# Patient Record
Sex: Female | Born: 1935
Health system: Southern US, Community
[De-identification: ages and names within clinical notes are randomized; demographics above are authoritative.]

## PROBLEM LIST (undated history)

## (undated) DIAGNOSIS — I428 Other cardiomyopathies: Secondary | ICD-10-CM

## (undated) DIAGNOSIS — E039 Hypothyroidism, unspecified: Secondary | ICD-10-CM

## (undated) DIAGNOSIS — M519 Unspecified thoracic, thoracolumbar and lumbosacral intervertebral disc disorder: Secondary | ICD-10-CM

## (undated) DIAGNOSIS — I509 Heart failure, unspecified: Secondary | ICD-10-CM

## (undated) DIAGNOSIS — N183 Chronic kidney disease, stage 3 unspecified: Secondary | ICD-10-CM

## (undated) DIAGNOSIS — I472 Ventricular tachycardia, unspecified: Secondary | ICD-10-CM

## (undated) DIAGNOSIS — I10 Essential (primary) hypertension: Secondary | ICD-10-CM

## (undated) DIAGNOSIS — I700 Atherosclerosis of aorta: Secondary | ICD-10-CM

## (undated) DIAGNOSIS — Z95810 Presence of automatic (implantable) cardiac defibrillator: Secondary | ICD-10-CM

## (undated) DIAGNOSIS — Q210 Ventricular septal defect: Secondary | ICD-10-CM

## (undated) DIAGNOSIS — I5022 Chronic systolic (congestive) heart failure: Secondary | ICD-10-CM

## (undated) DIAGNOSIS — Z8711 Personal history of peptic ulcer disease: Secondary | ICD-10-CM

## (undated) DIAGNOSIS — E785 Hyperlipidemia, unspecified: Secondary | ICD-10-CM

## (undated) DIAGNOSIS — M109 Gout, unspecified: Secondary | ICD-10-CM

## (undated) HISTORY — DX: Essential (primary) hypertension: I10

## (undated) HISTORY — DX: Heart failure, unspecified: I50.9

## (undated) HISTORY — DX: Presence of automatic (implantable) cardiac defibrillator: Z95.810

## (undated) HISTORY — PX: ABDOMINAL HYSTERECTOMY: SHX81

## (undated) HISTORY — PX: FEMUR IM NAIL: SHX1597

---

## 1997-12-28 ENCOUNTER — Ambulatory Visit (HOSPITAL_COMMUNITY): Admission: RE | Admit: 1997-12-28 | Discharge: 1997-12-28 | Payer: Self-pay | Admitting: Gastroenterology

## 1998-04-05 ENCOUNTER — Other Ambulatory Visit: Admission: RE | Admit: 1998-04-05 | Discharge: 1998-04-05 | Payer: Self-pay | Admitting: Obstetrics & Gynecology

## 1998-08-10 ENCOUNTER — Ambulatory Visit (HOSPITAL_COMMUNITY): Admission: RE | Admit: 1998-08-10 | Discharge: 1998-08-10 | Payer: Self-pay | Admitting: Internal Medicine

## 1998-08-14 ENCOUNTER — Inpatient Hospital Stay (HOSPITAL_COMMUNITY): Admission: EM | Admit: 1998-08-14 | Discharge: 1998-08-23 | Payer: Self-pay | Admitting: Emergency Medicine

## 1998-08-14 ENCOUNTER — Encounter: Payer: Self-pay | Admitting: Emergency Medicine

## 1998-08-19 ENCOUNTER — Encounter: Payer: Self-pay | Admitting: Neurosurgery

## 1998-09-03 ENCOUNTER — Ambulatory Visit (HOSPITAL_COMMUNITY): Admission: RE | Admit: 1998-09-03 | Discharge: 1998-09-03 | Payer: Self-pay | Admitting: Internal Medicine

## 1998-09-03 ENCOUNTER — Encounter: Payer: Self-pay | Admitting: Internal Medicine

## 1998-09-17 ENCOUNTER — Ambulatory Visit (HOSPITAL_COMMUNITY): Admission: RE | Admit: 1998-09-17 | Discharge: 1998-09-17 | Payer: Self-pay | Admitting: Neurosurgery

## 1998-09-17 ENCOUNTER — Encounter: Payer: Self-pay | Admitting: Neurosurgery

## 1999-09-20 ENCOUNTER — Encounter: Payer: Self-pay | Admitting: Internal Medicine

## 1999-09-20 ENCOUNTER — Encounter: Admission: RE | Admit: 1999-09-20 | Discharge: 1999-09-20 | Payer: Self-pay | Admitting: Internal Medicine

## 2000-02-12 ENCOUNTER — Ambulatory Visit (HOSPITAL_COMMUNITY): Admission: RE | Admit: 2000-02-12 | Discharge: 2000-02-12 | Payer: Self-pay | Admitting: Neurosurgery

## 2000-02-12 ENCOUNTER — Encounter: Payer: Self-pay | Admitting: Neurosurgery

## 2000-10-12 ENCOUNTER — Encounter: Admission: RE | Admit: 2000-10-12 | Discharge: 2000-10-12 | Payer: Self-pay | Admitting: Internal Medicine

## 2000-10-12 ENCOUNTER — Encounter: Payer: Self-pay | Admitting: Internal Medicine

## 2000-11-22 ENCOUNTER — Other Ambulatory Visit: Admission: RE | Admit: 2000-11-22 | Discharge: 2000-11-22 | Payer: Self-pay | Admitting: Internal Medicine

## 2002-01-03 ENCOUNTER — Other Ambulatory Visit: Admission: RE | Admit: 2002-01-03 | Discharge: 2002-01-03 | Payer: Self-pay | Admitting: Internal Medicine

## 2002-01-07 ENCOUNTER — Encounter: Payer: Self-pay | Admitting: Internal Medicine

## 2002-01-07 ENCOUNTER — Encounter: Admission: RE | Admit: 2002-01-07 | Discharge: 2002-01-07 | Payer: Self-pay | Admitting: Internal Medicine

## 2002-12-25 ENCOUNTER — Encounter: Admission: RE | Admit: 2002-12-25 | Discharge: 2002-12-25 | Payer: Self-pay | Admitting: Internal Medicine

## 2002-12-25 ENCOUNTER — Encounter: Payer: Self-pay | Admitting: Internal Medicine

## 2003-02-20 ENCOUNTER — Encounter (INDEPENDENT_AMBULATORY_CARE_PROVIDER_SITE_OTHER): Payer: Self-pay | Admitting: Cardiology

## 2003-02-20 ENCOUNTER — Ambulatory Visit (HOSPITAL_COMMUNITY): Admission: RE | Admit: 2003-02-20 | Discharge: 2003-02-20 | Payer: Self-pay | Admitting: Cardiology

## 2003-10-22 ENCOUNTER — Emergency Department (HOSPITAL_COMMUNITY): Admission: EM | Admit: 2003-10-22 | Discharge: 2003-10-22 | Payer: Self-pay | Admitting: Emergency Medicine

## 2003-10-26 ENCOUNTER — Encounter: Admission: RE | Admit: 2003-10-26 | Discharge: 2003-10-26 | Payer: Self-pay | Admitting: Cardiology

## 2003-11-02 ENCOUNTER — Inpatient Hospital Stay (HOSPITAL_BASED_OUTPATIENT_CLINIC_OR_DEPARTMENT_OTHER): Admission: RE | Admit: 2003-11-02 | Discharge: 2003-11-02 | Payer: Self-pay | Admitting: Cardiology

## 2003-11-04 ENCOUNTER — Inpatient Hospital Stay (HOSPITAL_COMMUNITY): Admission: RE | Admit: 2003-11-04 | Discharge: 2003-11-06 | Payer: Self-pay | Admitting: Internal Medicine

## 2003-11-05 ENCOUNTER — Encounter: Payer: Self-pay | Admitting: Internal Medicine

## 2004-05-12 ENCOUNTER — Emergency Department (HOSPITAL_COMMUNITY): Admission: EM | Admit: 2004-05-12 | Discharge: 2004-05-12 | Payer: Self-pay | Admitting: Emergency Medicine

## 2004-05-18 ENCOUNTER — Emergency Department (HOSPITAL_COMMUNITY): Admission: EM | Admit: 2004-05-18 | Discharge: 2004-05-18 | Payer: Self-pay | Admitting: Emergency Medicine

## 2004-07-22 ENCOUNTER — Encounter: Admission: RE | Admit: 2004-07-22 | Discharge: 2004-07-22 | Payer: Self-pay | Admitting: Internal Medicine

## 2004-11-29 ENCOUNTER — Encounter: Admission: RE | Admit: 2004-11-29 | Discharge: 2004-11-29 | Payer: Self-pay | Admitting: Cardiology

## 2005-03-28 ENCOUNTER — Ambulatory Visit: Payer: Self-pay | Admitting: Internal Medicine

## 2005-07-25 ENCOUNTER — Encounter: Admission: RE | Admit: 2005-07-25 | Discharge: 2005-07-25 | Payer: Self-pay | Admitting: Internal Medicine

## 2006-03-14 ENCOUNTER — Ambulatory Visit: Payer: Self-pay | Admitting: Internal Medicine

## 2006-04-13 ENCOUNTER — Ambulatory Visit: Payer: Self-pay

## 2006-04-27 ENCOUNTER — Ambulatory Visit: Payer: Self-pay | Admitting: Internal Medicine

## 2006-04-28 ENCOUNTER — Inpatient Hospital Stay (HOSPITAL_COMMUNITY): Admission: EM | Admit: 2006-04-28 | Discharge: 2006-05-01 | Payer: Self-pay | Admitting: Emergency Medicine

## 2006-05-13 ENCOUNTER — Emergency Department (HOSPITAL_COMMUNITY): Admission: EM | Admit: 2006-05-13 | Discharge: 2006-05-13 | Payer: Self-pay | Admitting: Emergency Medicine

## 2006-05-14 ENCOUNTER — Ambulatory Visit: Payer: Self-pay

## 2006-05-14 ENCOUNTER — Inpatient Hospital Stay (HOSPITAL_COMMUNITY): Admission: AD | Admit: 2006-05-14 | Discharge: 2006-05-16 | Payer: Self-pay | Admitting: Internal Medicine

## 2006-05-21 ENCOUNTER — Ambulatory Visit: Payer: Self-pay | Admitting: Cardiology

## 2006-05-23 ENCOUNTER — Ambulatory Visit: Payer: Self-pay | Admitting: Internal Medicine

## 2006-08-21 ENCOUNTER — Ambulatory Visit: Payer: Self-pay | Admitting: Internal Medicine

## 2006-09-05 ENCOUNTER — Encounter: Admission: RE | Admit: 2006-09-05 | Discharge: 2006-09-05 | Payer: Self-pay | Admitting: Internal Medicine

## 2006-12-26 ENCOUNTER — Ambulatory Visit: Payer: Self-pay | Admitting: Internal Medicine

## 2007-09-09 ENCOUNTER — Encounter: Admission: RE | Admit: 2007-09-09 | Discharge: 2007-09-09 | Payer: Self-pay | Admitting: Internal Medicine

## 2007-10-22 ENCOUNTER — Other Ambulatory Visit: Admission: RE | Admit: 2007-10-22 | Discharge: 2007-10-22 | Payer: Self-pay | Admitting: Internal Medicine

## 2007-12-19 ENCOUNTER — Ambulatory Visit (HOSPITAL_COMMUNITY): Admission: RE | Admit: 2007-12-19 | Discharge: 2007-12-19 | Payer: Self-pay | Admitting: Ophthalmology

## 2007-12-26 ENCOUNTER — Ambulatory Visit: Payer: Self-pay | Admitting: Internal Medicine

## 2007-12-26 LAB — CONVERTED CEMR LAB
CO2: 27 meq/L (ref 19–32)
Calcium: 9.7 mg/dL (ref 8.4–10.5)
Creatinine, Ser: 1 mg/dL (ref 0.4–1.2)
GFR calc Af Amer: 70 mL/min
GFR calc non Af Amer: 58 mL/min
Glucose, Bld: 89 mg/dL (ref 70–99)
INR: 1.9 — ABNORMAL HIGH (ref 0.8–1.0)
Potassium: 3.7 meq/L (ref 3.5–5.1)
Pro B Natriuretic peptide (BNP): 273 pg/mL — ABNORMAL HIGH (ref 0.0–100.0)
Prothrombin Time: 21 s — ABNORMAL HIGH (ref 10.9–13.3)

## 2008-01-01 ENCOUNTER — Ambulatory Visit: Payer: Self-pay | Admitting: Internal Medicine

## 2008-01-01 LAB — CONVERTED CEMR LAB
BUN: 14 mg/dL (ref 6–23)
CO2: 27 meq/L (ref 19–32)
Eosinophils Relative: 5.3 % — ABNORMAL HIGH (ref 0.0–5.0)
GFR calc Af Amer: 70 mL/min
HCT: 41.4 % (ref 36.0–46.0)
INR: 1.9 — ABNORMAL HIGH (ref 0.8–1.0)
MCV: 100 fL (ref 78.0–100.0)
Monocytes Absolute: 0.4 10*3/uL (ref 0.1–1.0)
Monocytes Relative: 8.6 % (ref 3.0–12.0)
RBC: 4.14 M/uL (ref 3.87–5.11)
Sodium: 137 meq/L (ref 135–145)
WBC: 5.2 10*3/uL (ref 4.5–10.5)
aPTT: 34.7 s — ABNORMAL HIGH (ref 21.7–29.8)

## 2008-01-06 ENCOUNTER — Ambulatory Visit: Payer: Self-pay | Admitting: Internal Medicine

## 2008-01-06 ENCOUNTER — Ambulatory Visit (HOSPITAL_COMMUNITY): Admission: RE | Admit: 2008-01-06 | Discharge: 2008-01-06 | Payer: Self-pay | Admitting: Internal Medicine

## 2008-01-22 ENCOUNTER — Ambulatory Visit: Payer: Self-pay

## 2008-03-16 ENCOUNTER — Ambulatory Visit: Payer: Self-pay | Admitting: Internal Medicine

## 2008-04-14 ENCOUNTER — Ambulatory Visit: Payer: Self-pay | Admitting: Internal Medicine

## 2008-04-28 ENCOUNTER — Ambulatory Visit: Payer: Self-pay | Admitting: Internal Medicine

## 2008-07-24 ENCOUNTER — Encounter: Payer: Self-pay | Admitting: Internal Medicine

## 2008-09-09 ENCOUNTER — Encounter: Admission: RE | Admit: 2008-09-09 | Discharge: 2008-09-09 | Payer: Self-pay | Admitting: Internal Medicine

## 2008-09-14 ENCOUNTER — Encounter: Admission: RE | Admit: 2008-09-14 | Discharge: 2008-09-14 | Payer: Self-pay | Admitting: Internal Medicine

## 2008-10-19 ENCOUNTER — Ambulatory Visit: Payer: Self-pay | Admitting: Internal Medicine

## 2008-12-01 ENCOUNTER — Ambulatory Visit: Payer: Self-pay | Admitting: Internal Medicine

## 2009-02-08 DIAGNOSIS — I119 Hypertensive heart disease without heart failure: Secondary | ICD-10-CM

## 2009-02-08 DIAGNOSIS — I5022 Chronic systolic (congestive) heart failure: Secondary | ICD-10-CM

## 2009-02-09 ENCOUNTER — Ambulatory Visit: Payer: Self-pay | Admitting: Internal Medicine

## 2009-03-04 ENCOUNTER — Ambulatory Visit: Payer: Self-pay | Admitting: Internal Medicine

## 2009-05-14 ENCOUNTER — Ambulatory Visit: Payer: Self-pay | Admitting: Internal Medicine

## 2009-05-17 ENCOUNTER — Ambulatory Visit: Payer: Self-pay | Admitting: Internal Medicine

## 2009-06-21 ENCOUNTER — Ambulatory Visit: Payer: Self-pay | Admitting: Internal Medicine

## 2009-09-03 ENCOUNTER — Ambulatory Visit: Payer: Self-pay | Admitting: Internal Medicine

## 2009-09-15 ENCOUNTER — Encounter: Admission: RE | Admit: 2009-09-15 | Discharge: 2009-09-15 | Payer: Self-pay | Admitting: Internal Medicine

## 2010-03-01 ENCOUNTER — Ambulatory Visit: Payer: Self-pay | Admitting: Internal Medicine

## 2010-03-01 DIAGNOSIS — I472 Ventricular tachycardia, unspecified: Secondary | ICD-10-CM | POA: Insufficient documentation

## 2010-03-10 ENCOUNTER — Ambulatory Visit: Payer: Self-pay | Admitting: Internal Medicine

## 2010-06-10 ENCOUNTER — Telehealth (INDEPENDENT_AMBULATORY_CARE_PROVIDER_SITE_OTHER): Payer: Self-pay | Admitting: *Deleted

## 2010-07-03 ENCOUNTER — Encounter: Payer: Self-pay | Admitting: Internal Medicine

## 2010-07-12 NOTE — Assessment & Plan Note (Signed)
Summary: defib check.[email or web address].amber   History of Present Illness:        Casey [name] is seen in followup for CHF in the setting of Nonischemic cardiomyopathy for which she is s/p Medtronic CRT implant.   She has no complaints of SOB and no palpitations  Current Medications (verified): 1)  Coumadin 4 Mg Tabs (Warfarin Sodium) .... Take A Directed 2)  Metoprolol Succinate 50 Mg Xr24h-Tab (Metoprolol Succinate) .... Take 1/2 Tablet Two Times A Day 3)  Aspirin 81 Mg Tbec (Aspirin) .... Take One Tablet By Mouth Daily 4)  Lisinopril 20 Mg Tabs (Lisinopril) .... Take 1 Tablet By Mouth Two Times A Day 5)  Furosemide 40 Mg Tabs (Furosemide) .... Take One Tablet By Mouth Daily. 6)  Potassium Chloride Crys Cr 20 Meq Cr-Tabs (Potassium Chloride Crys Cr) .... Take One Tablet By Mouth Daily 7)  One-A-Day Womens Formula  Tabs (Multiple Vitamins-Calcium) .... Take 1 Tablet By Mouth Once A Day 8)  Lexapro 5 Mg Tabs (Escitalopram Oxalate) .... Take 1/2 Tablet Daily 9)  Zocor 80 Mg Tabs (Simvastatin) .... Take 1 Tablet By Mouth Once A Day 10)  Vitamin D 1000 Unit  Tabs (Cholecalciferol) .... Take 1 Tablet By Mouth Once A Day  Allergies (verified): 1)  ! Codeine  Past History:  Past Medical History: Last updated: 02/08/2009 Current Problems:  CARDIOMYOPATHY (ICD-425.4) CHF (ICD-428.0) HYPERTENSION (ICD-401.9)    Vital Signs:  Patient profile:   76 year old female Height:      62 inches Weight:      163 pounds BMI:     29.92 Pulse rate:   67 / minute BP sitting:   144 / 88  (right arm)  Vitals Entered By: Margaretmary Bayley CMA (March 01, 2010 12:45 PM)  Physical Exam  General:  The patient was alert and oriented in no acute distress. HEENT Normal.  Neck veins were flat, carotids were brisk.  Lungs were clear.  Heart sounds were regular without murmurs or gallops.  Abdomen was soft with active bowel sounds. There is no clubbing cyanosis or edema. Skin Warm and dry     ICD  Specifications Following MD:  Virl Axe, MD     Referring MD:  Wynonia Lawman ICD Vendor:  Medtronic     ICD Model Number:  [id number]     ICD Serial Number:  [id number] ICD DOI:  01/06/2008     ICD Implanting MD:  Virl Axe, MD  Lead 1:    Location: RA     DOI: 11/04/2003     Model #: [id number]     Serial #: [id number]     Status: active Lead 2:    Location: RV     DOI: 04/30/2006     Model #: [id number]     Serial #: [id number]     Status: active Lead 3:    Location: LV     DOI: 11/04/2003     Model #: [id number]     Serial #[id number]     Status: active  Indications::  NICM, CHF   ICD Follow Up Battery Voltage:  3.10 V     Charge Time:  9.3 seconds     Underlying rhythm:  SR ICD Dependent:  No       ICD Device Measurements Atrium:  Amplitude: 3.0 mV, Impedance: 536 ohms, Threshold: 1.0 V at 1.0 msec Right Ventricle:  Amplitude: 6.6 mV, Impedance: 424 ohms, Threshold: 1.0 V at 0.4 msec Left Ventricle:  Impedance: 632 ohms, Threshold:  1.0 V at 0.5 msec Shock Impedance: 43/57 ohms   Episodes MS Episodes:  0     Percent Mode Switch:  0     Shock:  0     ATP:  2     Nonsustained:  0     Atrial Therapies:  0 Atrial Pacing:  3.3%     Ventricular Pacing:  99.8%  Brady Parameters Mode DDD     Lower Rate Limit:  60     Upper Rate Limit 130 PAV 130     Sensed AV Delay:  100  Tachy Zones VF:  200     VT:  240 (FVT VIA VF)     VT1:  158     Next Cardiology Appt Due:  02/13/2011 Tech Comments:  2 TREATED VT EPISODES--SUCCESSFUL ATP THERAPY.  15 NST EPISODES. OPTIVOL STABLE.  NORMAL DEVICE FUNCTION.  CHANGED MAX LEAD IMPEDANCE FOR LIA. TURNED ON 1:1 SVT.  [place]. ROV IN 12 MTHS W/SK. Shelly Bombard  March 01, 2010 1:01 PM  Impression & Recommendations:  Problem # 1:  VENTRICULAR TACHYCARDIA TREATED WITH ATP (ICD-427.1) Casey [name] had 2 intercurrent episodes of ventricular tachycardia treated with antitachycardia pacing;  we will continue her on her current meds and current pacing  algorithm  Problem # 2:  IMPLANTABLE   DEFIBRILLATOR CRT MDT (ICD-V45.02) Device parameters and data were reviewed and no changes were made  Problem # 3:  CARDIOMYOPATHY,NON ISCHEMIC (ICD-425.4) stable on her current medication  Problem # 4:  CHF (ICD-428.0) currently well compensated Her updated medication list for this problem includes:    Coumadin 4 Mg Tabs (Warfarin sodium) .Marland Kitchen... Take a directed    Metoprolol Succinate 50 Mg Xr24h-tab (Metoprolol succinate) .Marland Kitchen... Take 1/2 tablet two times a day    Aspirin 81 Mg Tbec (Aspirin) .Marland Kitchen... Take one tablet by mouth daily    Lisinopril 20 Mg Tabs (Lisinopril) .Marland Kitchen... Take 1 tablet by mouth two times a day    Furosemide 40 Mg Tabs (Furosemide) .Marland Kitchen... Take one tablet by mouth daily.  Problem # 5:  HYPERTENSION (ICD-401.9) blood pressure is reasonable  Patient Instructions: 1)  Your physician recommends that you continue on your current medications as directed. Please refer to the Current Medication list given to you today. 2)  Your physician wants you to follow-up in: [place] will receive a reminder letter in the mail two months in advance. If you don't receive a letter, please call our office to schedule the follow-up appointment.

## 2010-07-12 NOTE — Cardiovascular Report (Signed)
Summary: Office Visit   Office Visit   Imported By: Sallee Provencal 03/08/2010 10:43:57  _____________________________________________________________________  External Attachment:    Type:   Image     Comment:   External Document

## 2010-07-14 NOTE — Progress Notes (Signed)
Summary: Faxed records to [place] at [facility].  Faxed records to [place] at [facility]. EKG & LOV Fax: [phone] Phone: [phone] ext. Gwyndolyn Saxon  June 10, 2010 1:03 PM

## 2010-09-06 ENCOUNTER — Other Ambulatory Visit: Payer: Self-pay | Admitting: Internal Medicine

## 2010-09-06 DIAGNOSIS — Z1231 Encounter for screening mammogram for malignant neoplasm of breast: Secondary | ICD-10-CM

## 2010-09-08 ENCOUNTER — Ambulatory Visit (INDEPENDENT_AMBULATORY_CARE_PROVIDER_SITE_OTHER): Payer: Medicare Other | Admitting: Internal Medicine

## 2010-09-08 DIAGNOSIS — E785 Hyperlipidemia, unspecified: Secondary | ICD-10-CM

## 2010-09-08 DIAGNOSIS — E119 Type 2 diabetes mellitus without complications: Secondary | ICD-10-CM

## 2010-09-19 ENCOUNTER — Ambulatory Visit
Admission: RE | Admit: 2010-09-19 | Discharge: 2010-09-19 | Disposition: A | Discharge status: Home / Self Care | Payer: Medicare Other | Source: Ambulatory Visit | Attending: Internal Medicine | Admitting: Internal Medicine

## 2010-09-19 DIAGNOSIS — Z1231 Encounter for screening mammogram for malignant neoplasm of breast: Secondary | ICD-10-CM

## 2010-10-21 ENCOUNTER — Other Ambulatory Visit: Payer: Self-pay | Admitting: Internal Medicine

## 2010-10-25 NOTE — Letter (Signed)
December 26, 2006    W. Tollie Eth, M.D.  [id number], [place], [place] [address]   RE:  Casey [name], Casey [name]  MRN:  [id number]  /  DOB:  Jun 29, 1934   Dear Frederico Hamman:   Casey [name] comes in today.  She is doing really very well.  She is  sleeping better.  She has no complaints of chest pain or shortness of  breath.   The post shock depression seems to have largely waned.  Her medications  are notable for aspirin, lisinopril b.i.d., short acting metoprolol 25  b.i.d., Coumadin and furosemide.   EXAMINATION:  Her blood pressure is 132/90, her pulse is 86.  Lungs were clear.  Heart sounds were regular.  Extremities were without edema.   INTERROGATION:  Her Medtronics Sentry ICD demonstrates a P wave of 2.9  with an impedance of 480, threshold 1 volt of 0.3, the R wave was 6.9  with an impedance of 440 and threshold of 1, volt of 0.2. LV impedance  of 600, threshold of 1.5 volts of 0.2.  High voltage impedance was  44/55, battery voltage was 2.99.  There are no intercurrent episodes.   IMPRESSION:  1. Nonischemic cardiomyopathy.  2. Status post primary prevention, implantable cardiac defibrillator.  3. Chronic systolic congestive heart failure.  4. Paroxysmal atrial fibrillation.  5. Deep venous thrombosis.  6. Coumadin for the above.  7. Post shock depression.   Casey [name] looks terrific today.   We plan to see her again in 1 year's time.  If there is anything we can  do in the interim please do not hesitate to contact me.    Sincerely,      Deboraha Sprang, MD, [facility]  Electronically Signed    SCK/MedQ  DD: 12/26/2006  DT: 12/26/2006  Job #: [id number]

## 2010-10-25 NOTE — Letter (Signed)
April 13, 2008    W. Tollie Eth, M.D.  [id number], [place], [place] [address]   RE:  Casey [name], Casey [name]  MRN:  [id number]  /  DOB:  08/24/1934   Dear Casey [name],   Casey [name] came in today for followup of her previously implanted  device.  She has been feeling well without complaints of chest pain or  shortness of breath.   Review of her medications demonstrated that she is taking lisinopril 20  mg twice daily, metoprolol 50 mg twice daily, furosemide 40.   PHYSICAL EXAMINATION:  VITAL SIGNS:  Her blood pressure was quite high  in the 160 range and this persisted over about 10 or 15 minutes.  LUNGS:  Clear.  HEART:  Her heart sounds were regular.  There are no significant  murmurs.  ABDOMEN:  Soft.  EXTREMITIES:  Without edema.   Interrogation of her recently changed Concerto device demonstrated P-  wave of 2.9 with impedance of 560, a threshold of 1.5 at 0.4, the R-wave  was 6 with a pace impedance of 432, a threshold of 0.5 at 0.4.  LV  impedance was 584, threshold was 0.5 at 0.5.  Battery voltage of 3.21.  There were no intercurrent therapies or 2 intercurrent nonsustained  episodes.   IMPRESSION:  1. Nonischemic cardiomyopathy.  2. Congestive heart failure - chronic - systolic - stable.  3. Hypertension, somewhat anomalously elevated.   Casey [name], Casey [name]'s device is functioning well.  We will plan to  see her again on the anniversary of her device implant which is July  2010.   I have asked her to record a half dozen blood pressures in anticipation  of her seeing you in 2 weeks.  I thought that might help clarify whether  more drug therapy would be helpful.   Notably, she does not have a salt-heavy diet as she does not have  symptoms to suggest sleep apnea.    Sincerely,      Deboraha Sprang, MD, [facility]  Electronically Signed    SCK/MedQ  DD: 04/14/2008  DT: 04/14/2008  Job #: [id number]

## 2010-10-25 NOTE — Letter (Signed)
December 26, 2007    W. Tollie Eth, M.D.  [id number] [place], [place] [address]   RE:  Casey [name], Casey [name]  MRN:  [id number]  /  DOB:  01-11-1935   Dear Frederico Hamman,   Casey [name] comes in today for defibrillator followup.  She has  reached the ERI.  She is feeling very well without complaints of chest  pain or shortness of breath.  There have been no problems with  peripheral edema and no intercurrent arrhythmias of which she is aware.   Her current medications include Coumadin, Zocor 40, furosemide 40,  potassium 20, metoprolol 50 b.i.d., lisinopril 20, and aspirin.   She is allergic to CODEINE.   On examination, her blood pressure was 142/80 with a pulse of 76.  The  weight was 156.  The lungs were clear.  Neck veins were flat.  Heart  sounds were regular without murmurs or gallops.  The abdomen was soft.  The extremities were without edema.  Neurological exam was grossly  normal.   Electrocardiogram was not obtained.  Interrogation of her device  demonstrated a relatively rapid change in voltage from 2.9 to 2.6 over  the last 12 months.  We will really have the Medtronic look into this.   IMPRESSION:  1. Nonischemic cardiomyopathy.  2. Status post implantable cardioverter-defibrillator for nonischemic      cardiomyopathy.  3. Chronic congestive heart failure, status post CRT upgrade.  4. Paroxysmal atrial fibrillation.  5. Deep venous thrombosis of the left upper extremity.  6. Coumadin for the above.    Casey [name], Casey [name], has reached ERI and we have discussed  defibrillator generator replacement, the potential benefits as well as  the potential risks including but limited to infection or lead fracture.  She understands these risks and is willing to proceed.   We will plan to discontinue her Coumadin in anticipation of the  procedure as she is on board for atrial fibrillation and left upper  extremity DVT.   Thanks very much for allowing [place] to  participate in her care.    Sincerely,      Deboraha Sprang, MD, [facility]  Electronically Signed    SCK/MedQ  DD: 12/26/2007  DT: 12/27/2007  Job #: [id number]

## 2010-10-25 NOTE — Op Note (Signed)
[name], [name]         ACCOUNT NO.:  [id number]   MEDICAL RECORD NO.:  [id number]          PATIENT TYPE:  AMB   LOCATION:  SDS                          FACILITY:  [facility]   PHYSICIAN:  Garlan Fair., M.D.DATE OF BIRTH:  1934/08/29   DATE OF PROCEDURE:  12/19/2007  DATE OF DISCHARGE:  12/19/2007                               OPERATIVE REPORT   PREOPERATIVE DIAGNOSIS:  Immature cataract, right eye.   POSTOPERATIVE DIAGNOSIS:  Immature cataract, right eye.   OPERATION:  Kelman phacoemulsification cataract right eye with  intraocular lens implantation.   ANESTHESIA:  Local using Xylocaine 2% with Marcaine 0.75% with Wydase.   JUSTIFICATION FOR PROCEDURE:  This is a 76 year old lady with history of  glaucoma who complains of blurring of vision with difficulty seeing to  read.  She was evaluated and found to have bilateral immature cataracts,  slightly worse on the right than the left.  Cataract extraction with  intraocular lens implantation was recommended.  She was admitted at this  time for that purpose.   PROCEDURE:  Under the influence of IV sedation, a Van Lint akinesia and  retrobulbar anesthesia was given.  The patient was prepped and draped in  the usual manner.  The lid speculum was inserted under the upper and  lower lid of the right eye and a 4-0 silk  suture was passed through the  belly of the superior rectus muscle for traction.  A fornix-based  conjunctival flap was turned and hemostasis was achieved using cautery.  An incision was made in the sclera at the limbus.  This incision was  dissected down to clear cornea using crescent blade.  A sideport  incision made at 1:30 o'clock position.  Ocucoat was injected into the  eye through the sideport incision.  The anterior chamber was entered  through the corneoscleral tunnel incision at the 11:30 clock position.  An anterior capsulotomy was done using a bent #25 gauge needle.  The  nucleus was  hydrodissected using Xylocaine.  The KPE handpiece was  passed into the eye and the nucleus was begun to be emulsified.  Approximately one-third of the way through emulsification, it was noted  that the anterior chamber had deepened inferiorly and vitreous presented  itself in the wound.  The decision was then made to do an extracapsular  procedure.  Therefore, the corneal incision was then extended first to  the left, then to the right using corneal scissors.  A single 8-0 Vicryl  suture was placed across each arm of the wound respectively toward the  left, then toward the right. The nucleus was then manually expressed  from the eye. with a moderate amount of difficulty.  The 2 previously  placed sutures were closed and a third one was placed at 12 o'clock  position.  The anterior vitrector was placed in the eye, and as much of  the vitreous was removed from the anterior chamber that we could remove.  Then, using IA handpiece, the residual cortical material was aspirated.  Additional vitreous was then removed from the anterior chamber.  Ocucoat  was injected into the eye along with  Miochol to constrict the pupil.  An  anterior chamber lens was seated across the pupil into the same angle  and rotated in horizontal fashion.  A peripheral iridectomy was made in  the iris.  The corneoscleral wound was closed using a combination of  interrupted sutures of 8-0 Vicryl and 10-0 nylon.  After ascertaining  that the wound was airtight and watertight, the conjunctiva was closed  over wound using 8-0 Vicryl sutures.  1 mL of Celestone and 0.5 mL of  gentamicin were injected subconjunctivally.  Maxitrol ophthalmic  ointment and Pilopine ointment were applied along with a patch and Fox  shield.  The patient tolerated the procedure well and was discharged to  the post  anesthesia recovery room in satisfactory condition.  She is instructed  to rest today, to take Vicodin and Darvocet-N 100 every 4 hours  as  needed for pain, and see me in office tomorrow for further evaluation.   DISCHARGE DIAGNOSIS:  Immature cataract, right eye.      Garlan Fair., M.D.  Electronically Signed     TB/MEDQ  D:  12/19/2007  T:  12/20/2007  Job:  [id number]

## 2010-10-25 NOTE — Assessment & Plan Note (Signed)
[facility]                         ELECTROPHYSIOLOGY OFFICE NOTE   NAME:Hon, KEELYNN ZETINA                MRN:          [id number]  DATE:12/26/2006                            DOB:          09/21/1934    No dictation for this job.     Deboraha Sprang, MD, [facility]  Electronically Signed    SCK/MedQ  DD: 12/26/2006  DT: 12/27/2006  Job #: [id number]

## 2010-10-27 ENCOUNTER — Other Ambulatory Visit: Payer: Self-pay | Admitting: *Deleted

## 2010-10-27 DIAGNOSIS — E785 Hyperlipidemia, unspecified: Secondary | ICD-10-CM

## 2010-10-27 MED ORDER — SIMVASTATIN 80 MG PO TABS: 80.0000 mg | ORAL_TABLET | Freq: Every evening | ORAL | Status: DC

## 2010-10-28 NOTE — Discharge Summary (Signed)
NAME:  Casey [name], Casey [name]                   ACCOUNT NO.:  [id number]   MEDICAL RECORD NO.:  [id number]                   PATIENT TYPE:  OIB   LOCATION:  [id number]                                 FACILITY:  [facility]   PHYSICIAN:  Deboraha Sprang, M.D.               DATE OF BIRTH:  01/31/1935   DATE OF ADMISSION:  11/04/2003  DATE OF DISCHARGE:  11/05/2003                                 DISCHARGE SUMMARY   PRIMARY DISCHARGE DIAGNOSIS:  Nonischemic cardiomyopathy with an ejection  fraction of 20%, poor functional status.   HISTORY OF PRESENT ILLNESS:  This is a 76 year old female with past medical  history of cardiomyopathy, congestive heart failure, chest pain, ventricular  septal defect, and nonischemic cardiomyopathy with an ejection fraction of  20% with very limited function status. Status post catheterization which  showed an ejection fraction of 10% to 15% and no coronary disease. She is  admitted for a bi-V ICD.   HOSPITAL COURSE:  The patient underwent placement of a bi-V ICD Medtronic  type. She tolerated the procedure well. Had no immediate postoperative  complications. On the day of admission, no immediate postoperative  complications. She was discharged the following day to home in stable  condition on her previous mediations which include Coumadin 7.5 mg daily  except Tuesday and Friday 5, Zocor 40 nightly, Lasix 40 daily, Lopressor 50  1/2 tablet b.i.d., K-Dur 20 daily, multivitamin daily. Tylenol 1 to 2 tabs  every 4 to 6 hours as needed for pain.   ACTIVITY/WOUND CARE:  As per discharge sheet.   DIET:  Low-fat, low-salt, low-cholesterol diet.   FOLLOW UP:  The patient was to be seen at the pacemaker clinic at [facility] on  November 19, 2003 at 9:45 and Dr. Caryl Comes, February 24, 2004 at 10:50. She was to  follow with Dr. Wynonia Lawman as previously scheduled and have a PT/INR done next  Monday or Tuesday with Dr. Wynonia Lawman.      Forest Becker, C.R.N.P. [facility]                 Deboraha Sprang, M.D.    DS/MEDQ  D:  11/05/2003  T:  11/06/2003  Job:  [id number]   cc:   Kerry Hough., M.D.  [id number] [place]  [place] [address]  Fax: [phone]   Deboraha Sprang, M.D.

## 2010-10-28 NOTE — H&P (Signed)
NAME:  Casey, [name] NO.:  [id number]   MEDICAL RECORD NO.:  [id number]          PATIENT TYPE:  EMS   LOCATION:  MAJO                         FACILITY:  [facility]   PHYSICIAN:  Deboraha Sprang, MD, FACCDATE OF BIRTH:  1935/02/05   DATE OF ADMISSION:  04/27/2006  DATE OF DISCHARGE:                                HISTORY & PHYSICAL   HISTORY OF PRESENT ILLNESS:  Casey [name] is a 76 year old black  woman who is admitted to [facility] because of multiple  defibrillator discharges this evening.   The patient has a history of a nonischemic cardiomyopathy.  Her last cardiac  catheterization was performed in 2005 and demonstrated an ejection fraction  of 10% to 15% with no coronary artery disease.  An ICD was placed at that  time because of her poor left ventricular function; she did not have a  history of cardiac arrhythmia or conduction disorder.  In the intervening 2  years, she has never experienced a device discharge.  This evening, she was  awakened an ICD discharge.  Over the ensuing several hours, she experienced  what she estimates to be 10-15 discharges.  She was transported by EMS to  the emergency department.  In the emergency department, her rhythm appeared  to be sinus tachycardia (140 beats per minute) with left bundle branch  block.   The patient reports no palpitations, dizziness, lightheadedness, syncope, or  near-syncope.  She reports no dyspnea.  She denies chest pain, tightness,  heaviness, pressure, or squeezing.   The patient has no history of hypertension, diabetes mellitus, dyslipidemia,  smoking, or family history of coronary artery disease.   PAST MEDICAL HISTORY:  Otherwise unremarkable.   SOCIAL HISTORY:  She lives with her husband.  She does not smoke cigarettes  or drink alcohol.   MEDICATIONS:  1. Furosemide 40 mg p.o. daily.  2. Potassium chloride 20 mEq p.o. daily.  3. Warfarin 5 mg p.o. daily.  4. Metoprolol 25  mg p.o. b.i.d.  5. Lisinopril 20 mg p.o. daily.   ALLERGIES:  CODEINE.   OPERATIONS:  1. Cesarean section.  2. Hysterectomy.   FAMILY HISTORY:  Her mother died at 62 of congestive heart failure.  Her  father died of an unknown cancer.  There is no history of coronary artery  disease.   REVIEW OF SYSTEMS:  Review of systems reveals no new problems related to her  head, eyes, ears, nose, mouth, throat, lungs, gastrointestinal system,  genitourinary system, or extremities.  There is no history of neurologic or  psychiatric disorder.  There is no history of fever, chills or weight loss.   PHYSICAL EXAMINATION:  VITAL SIGNS:  Blood pressure 165/92.  Pulse 123 and  regular.  Respirations 24.  Temperature 98.0.  Pulse oximetry 100% on 2 L.  GENERAL:  The patient was an older black woman in no discomfort.  She was  alert, oriented, appropriate, and responsive.  HEENT:  Head, eyes, nose, and mouth were normal.  NECK:  Without thyromegaly or adenopathy.  Carotid pulses were palpable  bilaterally and without bruits.  CARDIAC:  Examination revealed a normal  S1 and S2.  There was no S3, S4,  murmur, rub, or click.  Cardiac rhythm was regular.  No chest wall  tenderness was noted.  LUNGS:  Clear.  ABDOMEN:  Soft and nontender.  There was no mass, hepatosplenomegaly, bruit,  distention, rebound, guarding, or rigidity.  Bowel sounds were normal.  BREASTS, PELVIC AND RECTAL:  Examinations were not performed as they were  not pertinent to the reason for acute care hospitalization.  EXTREMITIES:  Without edema, deviation, or deformity.  Radial and dorsalis  pedal pulses were palpable bilaterally.  NEUROLOGIC:  Brief screening neurologic survey was unremarkable.   LABORATORY AND ACCESSORY CLINICAL DATA:  The electrocardiogram revealed  sinus tachycardia (139 beats per minute) with right atrial enlargement and  left bundle branch block.   The chest radiograph, according to the radiologist,  demonstrated no evidence  of acute cardiopulmonary disease.   The initial set of cardiac markers revealed the myoglobin of 293, CK-MB 6.4,  and troponin 0.20.  The remaining studies were pending at the time of this  dictation.   IMPRESSION:  1. Multiple implantable cardioverter-defibrillator discharges.  The      underlying rhythm appears to be sinus tachycardia (139 beats per      minute) with left bundle branch block.  Troponin is 0.20.  2. Nonischemic cardiomyopathy; congestive heart failure.  Ejection      fraction 10% to 15%.   PLAN:  1. Cardiac stepdown unit.  2. Serial cardiac enzymes.  3. Intravenously metoprolol.  4. Device interrogation.  5. Further measures per Dr. Caryl Comes and Dr. Wynonia Lawman.      Broadus John, MD   Electronically Signed     ______________________________  Deboraha Sprang, MD, [facility]    MSC/MEDQ  D:  04/27/2006  T:  04/27/2006  Job:  [id number]   cc:   Broadus John, MD  Ezzard Standing, M.D.

## 2010-10-28 NOTE — Op Note (Signed)
NAMELEDORA, Casey [name] NO.:  [id number]   MEDICAL RECORD NO.:  [id number]          PATIENT TYPE:  INP   LOCATION:  2926                         FACILITY:  [facility]   PHYSICIAN:  Champ Mungo. Lovena Le, MD    DATE OF BIRTH:  1934/07/18   DATE OF PROCEDURE:  04/30/2006  DATE OF DISCHARGE:                                 OPERATIVE REPORT   PROCEDURE PERFORMED:  ICD lead revision, with defibrillation testing.   I. INTRODUCTION:  The patient is a 76 year old woman with a history of  nonischemic cardiomyopathy and congestive heart failure, who is status post  biventricular ICD insertion by Dr. Caryl Comes approximately 2-1/2 years ago.  The  patient did well, with improvement in her heart failure symptoms after  biventricular ICD implantation.  She was admitted to the hospital several  days ago with multiple ICD shocks secondary to ICD lead malfunction.  The  patient's rate sensing lead portion of her defibrillator lead had gone bad,  resulting in inability to capture high pacing impedance and multiple  episodes of noise on her rate sense defibrillation lead.  She had her  Coumadin held, and this was allowed to have her INR drift down, and she is  now referred for insertion of a new ICD lead.   II. PROCEDURE:  After informed consent was obtained, the patient was taken  to the diagnostic EP lab in the fasting state.  After the usual preparation  and draping, intravenous fentanyl and midazolam were given for sedation.  30  cc of lidocaine was infiltrated into the left infraclavicular region.  A 10  cc contrast injection was injected into the left upper extremity,  demonstrating a patent left subclavian vein, although there was evidence of  very severe narrowing at the left subclavian vein as well as at the  innominate vein site.  At this point, consideration was made to placing the  lead from the right side and tunneling it over to the left side.  However,  it was felt that a new lead  could likely be successfully implanted, and for  this reason the procedure was continued.  The left subclavian vein was  subsequently punctured, and a guidewire was attempted to be advanced.  However, this was unsuccessful, so a slick wire was utilized and ultimately  advanced into the inferior vena cava.  Initially. attempts to advance the  defibrillator lead were successful.  A 6-French sheath was then inserted  over the wire and dilated, followed by 7-French, 8-French, and 9-French  sheaths.  It should be noted that there was a great amount of resistance  each time dilation was attempted at the vein. However, there was no evidence  of any bleeding.  The long safe sheath was used initially in the 7-French  model. However, the defibrillation lead could ultimately not be passed  through this secondary to the very tight nature of the lead.  Initially, the  Spalding lead was attempted to be used.  At this point, a long 57-  [place] a safe sheath was utilized and with much difficulty advanced into the  superior vena cava.  From this position, the [employer], model [phone], 65 cm active fixation defibrillation lead, serial number  [id number], was advanced into the right ventricle with again a great deal of  difficulty.  Mapping was made more difficult by difficulty in maneuvering  the defibrillator lead.  At the final site on the RV septum, the R waves  were 11 and the pacing impedance with the lead actively fixed was 693 ohms.  The threshold was initially 1.2 V at 0.5 msec, and 10-volt pacing did not  stimulate the diaphragm.  There was a satisfactory injury potential on the  lead.  With these satisfactory, findings the lead was secured to the  subpectoralis fascia with a figure-of-eight silk suture.  The sewing sleeve  was also secured with silk suture.  Electrocautery was utilized to make a  subcutaneous pocket, and the old pocket was entered.  It should be noted   that kanamycin irrigation was utilized to irrigate the pocket prior to this.  Once the old generator was removed from its pocket, the pocket was irrigated  with kanamycin, and electrocautery was utilized to expand the pocket so that  the new defibrillator lead could be inserted into the pocket as well as the  old one.  Having this done successfully, the pocket was again irrigated with  kanamycin, and the patient was deeply sedated with fentanyl and Versed and  defibrillation threshold testing carried out.   After the patient was more deeply sedated with fentanyl and Versed, VF was  induced with a T-wave shock, and a 15-joule shock was delivered which  terminated VF.  There were several dropout beats.  However, because of the  extensive difficulty with the procedure, it was deemed that placement of a  new lead at this point or a new lead position at this point was thought not  to be a advisable, and the incision was then closed with layer of 2-0 Vicryl  followed by a layer of 3-0 Vicryl, followed by a layer of 4-0 Vicryl.  Benzoin was painted on the skin. Steri-Strips were applied, and a pressure  dressing was placed, and the patient was returned to her room in  satisfactory condition.   III. COMPLICATIONS:  There were no immediate procedural complications.   IV. RESULTS:  This demonstrated successful ICD lead revision in a patient  with a defective and failed defibrillator rate sensing lead.  There were  immediate procedural complications.  Of note, this procedure was extremely  difficult, with a duration lasting over 3 hours.      Champ Mungo. Lovena Le, MD  Electronically Signed     GWT/MEDQ  D:  04/30/2006  T:  04/30/2006  Job:  [id number]   cc:   Ezzard Standing, M.D.  Deboraha Sprang, MD, [facility]

## 2010-10-28 NOTE — Discharge Summary (Signed)
NAMEMAYIA, Casey [name] NO.:  [id number]   MEDICAL RECORD NO.:  [id number]          PATIENT TYPE:  INP   LOCATION:  3731                         FACILITY:  [facility]   PHYSICIAN:  Champ Mungo. Lovena Le, MD    DATE OF BIRTH:  02/22/1935   DATE OF ADMISSION:  05/14/2006  DATE OF DISCHARGE:  05/16/2006                               DISCHARGE SUMMARY   PRIMARY CAREGIVER:  Dr. Rodney Cruise.   CARDIOLOGIST:  Dr. Tollie Eth.   ELECTROPHYSIOLOGISTS:  1. Dr. Caryl Comes.  2. Dr. Lovena Le.   SHE HAS AN ALLERGY TO CODEINE.   PRINCIPAL DIAGNOSES:  Admitted with left upper arm swelling/deep vein  thrombosis by ultrasound in the office of [place] on May 14, 2006.   SECONDARY DIAGNOSES:  1. Recent admission, April 27, 2006, with cardioverter      defibrillator shocks.      a.     Finding of defective Medtronic [phone] active fixation dual       coil lead.      b.     Implant of Sprint Quattro lead.  2. Nonischemic cardiomyopathy, ejection fraction 10-15%.      a.     Left bundle branch block.      b.     Implant BiV ICD, May 2005.      c.     Class II - III congestive heart failure, chronic, systolic.   PROCEDURES:  None this admission.   The patient's arm was kept elevated and the patient's proTime monitored  daily.  The patient admitted on Coumadin therapy.  INR on admission was  2.1.   BRIEF HISTORY:  Casey [name] is a 76 year old female.  She had a  Medtronic [id number] generator with Medtronic [phone] Active fixation dual  coil defibrillator lead to the right ventricle implanted for a history  of nonischemic cardiomyopathy, left bundle branch block, May 2005.  The  device was a CRT-D.  Her last cath was in 2005, ejection fraction noted  to be 10-15%.  She has no coronary artery disease.   The patient has never heretofore had defibrillator therapy until  awakened the evening of April 27, 2006.  At that time, she had a  storm of the ICD discharges.  She counted  over 20.  She had no  antecedent chest pain, dyspnea, dizziness, palpitations, pre-syncope.  It was determined that her lead 6949 was faulty.  The rate sensing lead  portion was malfunctioning.   Her Coumadin was held.  She had the 6949 disabled and a Sprint Quattro  Medtronic active fixation dual coil lead implanted.  Prior to the  implant of the new lead, contrast venography had demonstrated severe  narrowing of the left subclavian vein as well as narrowing at the  innominate junction of the left subclavian.  The new lead was implanted  with difficulty.  Of note, a right-sided approach was considered prior  to this successful implant.   Casey [name] noted swelling on her left arm 3 days ago.  She had raised  her arm to the top of her head and it did not feel  right.  On closer  inspection, she noted the swelling.  There was no particular pain.  The  next day she was unable to contact medical assistance and the swelling  in her left upper arm increased.  Sunday, she went to the emergency room  at [facility].  She sat there for 6 hours.  They drew a proTime  and sent her home.   She presents to the office today, May 14, 2006.  She says the  swelling is better.  Her arm certainly is not taut or particularly  painful.  Ultrasound at [facility] office demonstrates clot in  the left subclavian vein.  She presents directly from the office  ANXIOUS, hoping that another procedure is not necessary.   HOSPITAL COURSE:  The patient presenting directly from [facility] office on May 14, 2006, with a diagnosis of left upper  extremity DVT.  Her INR is therapeutic at 2.1 and she was admitted.  Over the next 3 days the patient's arm has decidedly gotten better.  She  is not in any pain.  There mild swelling noted in the left forearm is  gone and the swelling in the left upper extremity is also much better.  She has kept her arm elevated throughout her  hospitalization here and  her INR has stayed therapeutic.  Discharging hospital day #3, December  5, on her regular home medications with early followup for proTime the  following week, early in the week of December 10.   Her home medications include:  1. Metoprolol 25 mg twice daily.  2. Coumadin 5 mg daily.  3. Furosemide 40 mg daily.  4. Potassium chloride 20 mEq daily.  5. Lisinopril 20 mg daily.   Follow up at [facility] for the Coumadin Clinic will be made  December 10 or December 11th, and this will be recorded on her discharge  sheet.  She is asked to keep her arm elevated, hot packs if necessary  for heat.  The patient is on a beta-blocker which is metoprolol.  She is  also on a ACE inhibitor which is Lisinopril.  She is asked to weigh  herself daily and to call the office if she gains 3-4 pounds in a 48-  hour period.  She is encouraged to walk daily.  She does not smoke and  therefore a smoking cessation consult was not necessary.   LABORATORY STUDIES:  This admission, hemoglobin 12.8, hematocrit 37.3,  white cells 7.4, platelets 288.  Serum electrolytes:  Sodium 138,  potassium 4.2, chloride 105, carbonate 26, BUN is 14, creatinine 1.1,  glucose 159.  Her admission INR is 2.1; discharge INR, at least on  December 4, was 2.5.  Alkaline phosphatase 84, SGOT is 22, SGPT is 12.      Casey Margarita, PA      [place] Lovena Le, MD  Electronically Signed    GM/MEDQ  D:  05/16/2006  T:  05/16/2006  Job:  [id number]   cc:   Rodney Cruise, Dr.  Ezzard Standing, M.D.  Deboraha Sprang, MD, [facility]  Champ Mungo. Lovena Le, MD

## 2010-10-28 NOTE — Assessment & Plan Note (Signed)
[place] OFFICE NOTE   EARNA, CAPUTI                  MRN:          [id number]  DATE:05/14/2006                            DOB:          [birth year]    Ms. Jardon was seen today in the clinic on May 14, 2006, for a  wound check of her lead revision done on April 30, 2006.  She did  have one of the alert leads that she had received multiple shocks for  and they implanted a new lead, 6947, on November 19.  On interrogation  of her device today her battery voltage is 3.10 with a charge time of  7.71 seconds.  P waves measured 3.1 millivolts with an atrial capture  threshold of 1 volt at 0.4 milliseconds and an atrial lead impedance of  496.  R waves measured 6.5 millivolts with a right ventricular capture  threshold of 1 volt at 0.5 milliseconds and a right ventricular lead  impedance of 416.  Left ventricular pacing threshold was 1 volt at 0.5  milliseconds with a left ventricular lead impedance of 592 ohms.  Shock  impedance was 41.  There were no episodes since procedure date, no  changes were made in her parameters.   She did go to the emergency room last night complaining of swelling in  that left arm and redness and was sent home without any further  investigation on it.  Today the arm is red and edematous, and after  talking with Dr. Dorris Carnes who is doctor of the day today we were going  to go ahead and get an ultrasound of that arm and follow up once we see  the results of that.      Alma Friendly, LPN  Electronically Signed      Deboraha Sprang, MD, [facility]  Electronically Signed   PO/MedQ  DD: 05/14/2006  DT: 05/14/2006  Job #: [id number]

## 2010-10-28 NOTE — Op Note (Signed)
NAME:  Casey [name], Casey [name]                   ACCOUNT NO.:  [id number]   MEDICAL RECORD NO.:  [id number]                   PATIENT TYPE:  OIB   LOCATION:  6533                                 FACILITY:  [facility]   PHYSICIAN:  Deboraha Sprang, M.D.               DATE OF BIRTH:  Oct 14, 1934   DATE OF PROCEDURE:  11/04/2003  DATE OF DISCHARGE:                                 OPERATIVE REPORT   PREOPERATIVE DIAGNOSES:  1. Nonischemic cardiomyopathy.  2. Class III congestive failure.  3. Left bundle branch block.   POSTOPERATIVE DIAGNOSES:  1. Nonischemic cardiomyopathy.  2. Class III congestive failure.  3. Left bundle branch block.   PROCEDURE:  Dual-chamber defibrillator implantation with biventricular lead  insertion and intraoperative defibrillation threshold testing.   Following the obtaining of informed consent, the patient was brought to the  electrophysiology laboratory and placed on the fluoroscopic table in supine  position.  After routine prep and drape, lidocaine was infiltrated in the  prepectoral subclavicular region and an incision was made and carried down  to the layer of the prepectoral fascia using electrocautery and sharp  dissection.  A pocket was formed similarly.  Hemostasis was obtained.   Thereafter attention was turned to gaining access to the extrathoracic left  subclavian vein, which I could not accomplish initially.  I thus undertook  an upper extremity venogram which illuminated the course of the vein, which  was quite in a different location from what I had anticipated.  This allowed  for successful venipuncture and cannulation of the vein with three separate  punctures.  Three guidewires were placed and retained.  A 0 silk suture was  placed around the two more cephalad wires.   Subsequently a 7 [place] tear-away introducer sheath was placed, through  which was then passed a Medtronic 6949 65-cm active-fixation dual-coil  defibrillator lead, serial  number [id number].  Under fluoroscopic guidance  it was manipulated to the right ventricular apex, where the bipolar R-wave  was 30 mV with a pacing impedance of 1021 Ohms, a pacing threshold of 1.1 V  at 0.5 msec with current at threshold of 1.3 mA.  There was no diaphragmatic  pacing at 10 volts.   This lead was secured to the prepectoral fascia and over the more caudal  guidewire which was, in fact, a Wholey wire used given the tortuosity of the  veins, and a Medtronic MB2 sheath, the coronary sinus was cannulated with  minimal difficulty.  Venography was obtained, which demonstrated a dearth of  vessels on the lateral wall.  There was a vessel that came off the posterior  aspect and went to the posterolateral wall; however, this had a separate  ostium and the only vessel that was off the main branch of the coronary  sinus was the middle cardiac vein with a very septal course.  Given this,  the anterolateral wall was explored and, in fact, there was a branch  that  originated from the anterolateral branch and then descended to the lateral  wall.  This vein was then explored with a wire and ultimately a Medtronic  4193 88-cm unipolar left ventricular lead, serial number [id number], was  passed to this wall.  Unfortunately, along the entire length of that segment  diaphragmatic pacing was seen at 10 volts.  However, in a position at the  junction of the midportion and apex on the lateral wall, the pacing  threshold was 0.6 V at 0.5 msec with an R-wave of 29, impedance of 733, and  a current of threshold of 1.1 mA.  There was no diaphragmatic pacing at 6  volts, and so the safety margin of a hundredfold was elected to be retained.  The initial sheath was removed and over the last retained guidewire a 7  French sheath was placed, through which was then passed a Medtronic 5076 52-  cm active-fixation atrial lead, serial number [id number].  It was  manipulated to the right atrial appendage,  where the bipolar P-wave was 3.5  mV with a pacing impedance of 976 Ohms but a pacing threshold that was quite  high at 2.6 V at 0.5 msec.  I expected this to come down (see below), and it  did not do as much as I had though.  Current at threshold was 3.6 mA.  With  these acceptable parameters recorded, the LV delivery system was removed and  the lead reassessed.  Parameters were stable.  The leads were then attached  to a Medtronic Sentry 7297 device, serial number [id number].  Ventricular  pacing, P-synchronous pacing, and then P-synchronous biventricular pacing  were identified.  The pocket was copiously irrigated with antibiotic-  containing saline solution and hemostasis was assured, and the leads and the  pulse generator were then placed in the pocket and secured to the  prepectoral fascia.  Defibrillation threshold testing was then undertaken.   Ventricular fibrillation was induced via the T-wave shock.  After a total  duration of approximately six seconds, a 20-joule shock was delivered  through a measured resistance of 46 Ohms, failing to terminate ventricular  fibrillation.  After a total duration of 16 seconds, a 30-joule shock was  delivered through a measured resistance of 46 Ohms, also failing to  terminate ventricular fibrillation, and the patient was then rescued  externally by a 360-joule shock.   After a wait of five to six minutes, the patient was reinduced into  ventricular fibrillation.  Given the high defibrillation thresholds and the  lack really of options in this lady who is really quite sick, I elected to  try it once at 35 joules.  A 35-joule shock was delivered after a total  duration of 10 seconds through a resistance of 47 Ohms, terminating  ventricular fibrillation and restoring sinus rhythm.  At this point the  device was implanted.  The pocket was closed in three layers in the normal  fashion.  The wound was washed, dried, and a Benzoin and  Steri-Strip dressing was applied.  The needle counts, sponge counts, and instrument  counts were correct at the end of the procedure according to the staff.   ADDENDUM:  Following attachment of the leads to the device, the bipolar P-  wave was 1.9 mV with a pacing threshold of 2.5 V at 0.6 msec with an  impedance of 800 Ohms.  The RV amplitude was 15.4 mV with a pacing impedance  of 776 and a threshold of  1 V at 0.2 msec, and the LV impedance was 640 Ohms  with a threshold of 1 volt at 0.3 msec.   The device was implanted.  All the patient's device is programmed in a pain-  free mode at 160/200/240.  All shocks are at maximum output.                                               Deboraha Sprang, M.D.    SCK/MEDQ  D:  11/04/2003  T:  11/05/2003  Job:  [id number]   cc:   W. Doristine Church., M.D.  [id number] [place]  [place] [address]  Fax: [phone]   Electrophysiology Laboratory   [place] Renold Genta, M.D.  [address], [place]  [place] [address]  Fax: [phone]

## 2010-10-28 NOTE — Discharge Summary (Signed)
Casey [name], Casey [name] NO.:  [id number]   MEDICAL RECORD NO.:  [id number]          PATIENT TYPE:  INP   LOCATION:  2927                         FACILITY:  [facility]   PHYSICIAN:  Ezzard Standing, M.D.DATE OF BIRTH:  04/16/1935   DATE OF ADMISSION:  04/27/2006  DATE OF DISCHARGE:  05/01/2006                               DISCHARGE SUMMARY   FINAL DIAGNOSES:  1. Malfunctioning ventricular lead of the defibrillator.  2. Nonischemic cardiomyopathy.  3. Congestive heart failure, class II, with biventricular pacemaker.  4. Chronic Coumadin therapy.   HISTORY:  The patient is a 76 year old woman who has an implantable  defibrillator for nonischemic cardiomyopathy and a biventricular  pacemaker.  She had been stable and doing well but had a defibrillator  that was on lead recall.  She has never had a previous discharge.  The  night of admission she was awakened with multiple discharges and  transported by EMS to the emergency department.  In the ER, she was in  sinus tachycardia at that time.  She presented and please see the  previously dictated history physical for the remainder of the details.   HOSPITAL COURSE:  The patient had a magnet placed over the defibrillator  and it was deactivated.  Her sodium was 139, potassium 3.6, chloride  110, glucose 118, BUN 24, creatinine 1.1.  INR was 2.4.  CPK peaked at  1419 with 29.6 units of MB, troponin was 8.47 thought due to the  discharges.   She had her defibrillator reprogrammed off and when checked was found to  have a high lead impedance suggestive of lead fracture of the recalled  lead.  She was seen in consultation by Dr. Cristopher Peru who felt that  she would need lead revision.  Her Coumadin was held and she underwent  lead revision on April 30, 2006.  It was a somewhat difficult  procedure requiring dilation of the left subclavian innominate vein to  get another lead in.  Another Sprint Quattro [id number] lead was  then  implanted with R-wave of 11.3, impedence of 693 and threshold 1.2 volts  at 0.5 milliseconds.  She tolerated the lead insertion well and was  discharged on the next day in improved condition.  Decision was made not  to place another generator.  She is discharged at this time with  instructions given by the electrophysiology doctors regarding care of  the wound site and activity.   DISCHARGE MEDICATIONS:  1. She is to take Keflex 500 mg 1 tablet half an hour before      breakfast, lunch and dinner and bedtime for 5 days.  2. She is to remain on her current medicines which include      a.     Lisinopril 20 mg daily.      b.     Warfarin 5 mg daily.      c.     Metoprolol 25 b.i.d.      d.     Furosemide 40 mg daily.      e.     Potassium 20 mEq daily.   FOLLOW UP:  She  is to follow-up with Dr. Caryl Comes in the ICD clinic on  Monday, May 14, 2006, at 9:00 a.m.  She is to see me for follow-up  in 1-2 weeks and is to have a protime followed up at that point.      Ezzard Standing, M.D.  Electronically Signed     WST/MEDQ  D:  05/01/2006  T:  05/01/2006  Job:  [id number]   cc:   Deboraha Sprang, MD, [facility]

## 2010-10-28 NOTE — Assessment & Plan Note (Signed)
[facility]                         ELECTROPHYSIOLOGY OFFICE NOTE   NAME:[name], Casey [name]                MRN:          [id number]  DATE:05/23/2006                            DOB:          16-Nov-1934    Casey [name] returns today for followup.  She is a very pleasant,  middle-aged woman with a non-ischemic cardiomyopathy, status post bi-V  ICD, who developed defibrillator lead failure and had to have it  revised.  Her postoperative course was complicated by left-arm DVT for  which she has been treated with Coumadin.  She initially had marked  swelling and had to be in the hospital, but with elevation of her arm  and continued anticoagulation, she is much improved.  She notes that her  tenderness and discomfort in her left arm is much better than it was.  She saw Dr. Wynonia Lawman yesterday and reports that her INR was actually 4.  She has had no bleeding problems and her pain is markedly improved.   PHYSICAL EXAMINATION:  She is a pleasant, well-appearing, middle-aged  woman in no distress.  Blood pressure was 120/90, the pulse is 80 and  regular, respirations were 18, the weight was 158 pounds.  NECK:  Revealed no jugular venous distention.  LUNGS:  Clear bilaterally to auscultation.  CARDIOVASCULAR EXAM:  Revealed a regular rate and rhythm with normal S1  and S2.  EXTREMITIES:  Demonstrated no cyanosis, clubbing or edema.  The left arm  demonstrates no significant swelling.   IMPRESSION:  1. Left-arm DVT, following ICD lead revision.  2. Status post Sprint Fidelis lead with lead failure and multiple ICD      shocks.  3. Non-ischemic cardiomyopathy with congestive heart failure.   DISCUSSION:  Overall, Casey [name] is stable.  She will continue on her  Coumadin.  I will see her back in several months, as needed.  She will  follow back up with Dr. Wynonia Lawman.     Champ Mungo. Lovena Le, MD  Electronically Signed    GWT/MedQ  DD: 05/23/2006  DT:  05/23/2006  Job #: [id number]   cc:   Ezzard Standing, M.D.

## 2010-10-28 NOTE — Discharge Summary (Signed)
NAME:  Casey [name], Casey [name]                   ACCOUNT NO.:  [id number]   MEDICAL RECORD NO.:  [id number]                   PATIENT TYPE:  OIB   LOCATION:  [id number]                                 FACILITY:  [facility]   PHYSICIAN:  Deboraha Sprang, M.D.               DATE OF BIRTH:  [birth year]   DATE OF ADMISSION:  11/04/2003  DATE OF DISCHARGE:  11/06/2003                                 DISCHARGE SUMMARY   ADDENDUM:  On May 26, the patient had episodes of right sided chest pain  with tachycardia with rates of 130s to 140s. She had a 2-D performed which  showed no pericardial effusion with EF of 5 to 10%. The patient had been off  of her beta blockers for approximately a day and a half. Beta blockers were  reinstituted. The patient's rate today has been in the high 90s. She has no  further chest pain and was discharged to home to follow with Dr. Wynonia Lawman next  week and continue with the previous dictated discharge summary instructions.      Forest Becker, C.R.N.P. [facility]                 Deboraha Sprang, M.D.    DS/MEDQ  D:  11/06/2003  T:  11/06/2003  Job:  [id number]   cc:   Kerry Hough., M.D.  [id number] [place]  [place] [address]  Fax: [phone]

## 2010-10-28 NOTE — Assessment & Plan Note (Signed)
[facility]                                 ON-CALL NOTE   BREINA, GEER                  MRN:          [id number]  DATE:05/13/2006                            DOB:          10-09-1934    PRIMARY CARDIOLOGIST:  Deboraha Sprang, M.D.   Mrs. Dolley called our answering service today with complaints of  left arm swelling.  The patient has a history of recent admission for  implantable defibrillator lead revision per Dr. Caryl Comes.  The patient was  discharged on May 01, 2006, and was to follow up with Dr. Caryl Comes in  the office on May 14, 2006, at 9 a.m.  The patient has been  complaining of swelling in her left arm for the last 72 hours.  She  states that it started on Friday just above the antecubital area of her  left arm and has noticed swelling going up the left arm to the mid  portion, not as far as the shoulder.  She states that she has been  putting ice on it and it did get better, but when she took the ice off  the swelling resumed.  She denies any pain, she denies any numbness or  tingling, she denies bleeding from that site.  There is no skin  discoloration that she is describing.  She states when she palpates the  swelling it is sore but not severe pain.   The patient is known to be on Coumadin at home and has had followup  PT/INR checks.  She does not know the most recent results.   I have asked the patient to have this site evaluated by a physician.  I  have advised her and her husband, who also got on the phone, to have her  come by the emergency room and have the emergency room physician to  evaluate that site to make sure there is no bleeding, hematoma, or  infection at the site with physical exam of that left arm to evaluate  for pulses.  The patient states that she would come by the emergency  room to follow up with our physician if the swelling did not decrease.  She asked if she could follow up and see Dr. Caryl Comes in  his office at  already-scheduled appointment at 9 a.m. in the morning.  I told her if  the pain continued or the swelling continued that she needed to have  this evaluated sooner than later to avoid any further problems with  this.  She states that she would go by the emergency room and have it  evaluated and follow further instructions per emergency room physician  if necessary.  Otherwise, the patient will follow with Dr. Caryl Comes in his  office on December 3 at 9 a.m. as the previously-scheduled appointment.      Phill Myron. Purcell Nails, NP  Electronically Signed      Champ Mungo. Lovena Le, MD  Electronically Signed   KML/MedQ  DD: 05/13/2006  DT: 05/14/2006  Job #: [id number]

## 2010-10-28 NOTE — Letter (Signed)
August 21, 2006    W. Tollie Eth, M.D.  [id number], [place] [address]   RE:  Casey [name], Casey [name]  MRN:  [id number]  /  DOB:  [birth year]   Dear Frederico Hamman:   Casey [name] comes in today.  She has an abscess in her tooth  with swelling.  Extraction has been put off because of her Coumadin  which as you recall was initiated in the fall because of a DVT at the  time of her lead revision associated with a fracture of her 6949 lead.   Her breathing is okay.  There is some complaint of some shortness of  breath.  Her biggest concern is around the infection in her mouth.   Her medications currently include Metoprolol 25 b.i.d., lisinopril 20  b.i.d., aspirin, Coumadin, Zocor, and Furosemide 40 a day.   PHYSICAL EXAMINATION:  VITAL SIGNS:  Weight 155, blood pressure 136/77,  pulse 88.  NECK:  Neck veins were about 8-9 cm.  LUNGS:  Clear.  HEART:  Sounds were regular with a 2/6 murmur.  EXTREMITIES:  Just trace edema.   Interrogation of her Medtronic [phone] ICD demonstrates a P wave of 3.2  with impedance of 480, threshold of 1 volt at 0.38, R wave was 7.9 with  impedance of 432, threshold of 1 volt at 0.5.  LV impedance was 576,  threshold was 1 volt at 0.4.  High voltage impedance was 41 ohms,  battery voltage was 3.06 and there were no intercurrent episodes.  Her  OptiVol index was through the roof and had been since December.   IMPRESSION:  1. Nonischemic cardiomyopathy.  2. Acute on chronic systolic congestive heart failure.  3. Probable mouth abscess.  4. Deep venous thrombosis of her left upper extremity.  5. Paroxysmal atrial fibrillation.  6. Coumadin for the above.   Frederico Hamman, I have asked Casey [name] to stop her Coumadin so that she can  get what I think is probably a tooth abscess taken care of.  I have put  her on amoxicillin and have tried to get up with her dentist today, but  not yet heard back from him.   I have asked her to go back  on her Coumadin longterm recalling that she  was on it previously for what we thought was atrial fibrillation by  electrograms and also inter currently for her DVT of her left upper  extremity which seems to have resolved.   Please let me know if you would like [place] to be following her, otherwise,  we will have her plan to follow up with you for her defibrillator.  We  will plan to see her again in 1 years' time.    Sincerely,      Casey Sprang, MD, [facility]  Electronically Signed    SCK/MedQ  DD: 08/21/2006  DT: 08/23/2006  Job #: [id number]   CC:   Debria Garret, dentist  Cresenciano Lick. Renold Genta, M.D.

## 2010-10-28 NOTE — Consult Note (Signed)
NAMECECELY, MINNIS NO.:  [id number]   MEDICAL RECORD NO.:  [id number]          PATIENT TYPE:  OBV   LOCATION:  2907                         FACILITY:  [facility]   PHYSICIAN:  Champ Mungo. Lovena Le, MD    DATE OF BIRTH:  1935/05/17   DATE OF CONSULTATION:  04/27/2006  DATE OF DISCHARGE:                                   CONSULTATION   INDICATIONS FOR CONSULTATION:  Evaluation of multiple ICD shocks in a  patient with a prior ICD implant secondary to a nonischemic cardiomyopathy.   HISTORY OF PRESENT ILLNESS:  The patient is a very pleasant 76 year old  woman with a history of severe nonischemic cardiomyopathy and congestive  heart failure who underwent BiV ICD implantation by Dr. Caryl Comes approximately  two years ago.  The patient noted a marked improvement in her symptoms of  heart failure after the ICD implanted.  She was in her usual state of health  until yesterday when she was admitted to hospital with multiple ICD shocks.  She was subsequently found on device interrogation to have noise on her  defibrillator lead resulting in multiple ICD discharges.  She is now  referred for additional evaluation.  The patient denies chest pain or  shortness of breath and has had no trauma to the device, at all.  She denies  any previous history or episodes of this and heart failure remains quite  well controlled, presently class I-II.   MEDICATIONS:  Her medications include Lasix, potassium, warfarin, metoprolol  and lisinopril.   ALLERGIES:  CODEINE.   SOCIAL HISTORY:  The patient is married and  lives with her husband.  She  denies tobacco or alcohol use.   REVIEW OF SYSTEMS:  Negative except as noted in the HPI.  All systems were  reviewed and found to be negative.   PHYSICAL EXAMINATION:  GENERAL:  She is a pleasant well-appearing 76 year old woman in no acute  distress.  VITAL SIGNS:  Blood pressure was 160/90, pulse was 75 and regular,  respirations were 18, weight  was 76 kg.  HEENT:  Exam was normal.  NECK:  The neck revealed 7 cm jugular venous distension.  There is no  thyromegaly.  The trachea was midline.  The carotids were 2+ and symmetric.  LUNGS:  Clear bilaterally to auscultation, no wheezes, rales or rhonchi.  There is no increased work of breathing.  CARDIAC:  Regular rate and rhythm with a normal S1 and a split S2.  The PMI  was enlarged and laterally displaced.  There is a soft systolic murmur at  the left lower sternal border.  ABDOMEN:  Soft, nontender, nondistended.  No organomegaly.  Bowel sounds are  present.  There is no rebound or guarding.  EXTREMITIES:  No cyanosis, clubbing or edema.  Pulses were 2+ and symmetric.  NEUROLOGIC:  Alert and oriented x3.  Cranial nerves intact.  Strength 5/5  and symmetric.   The EKG demonstrates sinus rhythm with T synchronous ventricular pacing.  Review of her device interrogation demonstrates multiple ICD shocks  secondary to noise on the defibrillator lead.   IMPRESSION:  1. Nonischemic cardiomyopathy status  post ICD insertion.  2. Congestive heart failure.  3. Multiple ICD shocks secondary to electrical noise due to Medtronic      defibrillator lead malfunction.   DISCUSSION:  Ms. Crooker's defibrillator has been reprogrammed off to  minimize any additional ICD shocks.  Her backup pacing with LV pacing will  be maintained.  I have recommended defibrillator lead revision.  The patient  is on Coumadin and we will plan letting her INR drift back down to  subtherapeutic levels before proceeding with lead revision.      Champ Mungo. Lovena Le, MD  Electronically Signed     GWT/MEDQ  D:  04/27/2006  T:  04/27/2006  Job:  [id number]   cc:   Ezzard Standing, M.D.  Deboraha Sprang, MD, [facility]

## 2010-10-28 NOTE — Cardiovascular Report (Signed)
NAME:  Casey [name], Casey [name] NO.:  [id number]   MEDICAL RECORD NO.:  [id number]                   PATIENT TYPE:  OIB   LOCATION:  6501                                 FACILITY:  [facility]   PHYSICIAN:  W. Doristine Church., M.D.         DATE OF BIRTH:  10/22/1934   DATE OF PROCEDURE:  11/02/2003  DATE OF DISCHARGE:                              CARDIAC CATHETERIZATION   HISTORY:  A 76 year old female with a known cardiomyopathy who is being  referred for an implantable defibrillator.  The electrophysiologist want her  to have coronary angiography because of possible history of angina.   COMMENTS ABOUT PROCEDURE:  The patient tolerated the procedure well without  complications.  Following the procedure, she had good hemostasis and  peripheral pulses noted.   HEMODYNAMIC DATA:  1. Aorta postcontrast:  132/72.  2. LV postcontrast:  132/11-14.   ANGIOGRAPHIC DATA:   LEFT VENTRICULOGRAM:  Performed in the 30 degree RAO projection.  The aortic  root was noted to be somewhat tortuous.  The aortic valve was normal.  The  mitral valve was normal.  There was no significant mitral regurgitation.  The left ventricle was markedly dilated.  There was severe global  hypokinesis with a dyssynchronous contraction pattern of the left ventricle  compatible with cardiomyopathy.  The estimated ejection fraction was  markedly reduced and estimated at 10-15%.  Coronary arteries arise and  distribute normally.  The left main coronary artery is normal.  The left  anterior descending contains no significant disease.  There is a large  intermediate branch that arises high in the vessel that contains no  significant disease.  The circumflex coronary artery has a series of three  obtuse marginal arteries in the posterior lateral branches which are free of  disease.  The right coronary artery supplies the posterior descending artery  and is free of disease.   IMPRESSION:  1. Dilated  left ventricle with severe diffuse global hypokinesis with     estimated ejection fraction of 10-15%.  2. No significant coronary artery disease identified.   RECOMMENDATIONS:  The patient is currently off of Coumadin.  She will be  referred for an implantable defibrillator.                                               Kerry Hough., M.D.    WST/MEDQ  D:  11/02/2003  T:  11/02/2003  Job:  [id number]   cc:   Deboraha Sprang, M.D.   Cresenciano Lick. Renold Genta, M.D.  [address], [place]  [place] [address]  Fax: [phone]

## 2010-10-28 NOTE — Letter (Signed)
March 14, 2006     W. Tollie Eth, M.D.  [id number], [place], [place] [address]   RE:  LOVELYN, ZILLES  MRN:  [id number]  /  DOB:  August 15, 1934   Dear Frederico Hamman:   Mrs. Cranson comes in today for her annual recheck.  She has been doing so  much better she says.  The burden of taking care of her mother in  [place], apparently quite overwhelming.  She is getting around and she is  having little shortness of breath and chest pains have resolved.   Her medications are reviewed.   PHYSICAL EXAMINATION:  VITAL SIGNS:  Blood pressure 110/68, pulse 72.  LUNGS:  Clear.  CARDIOVASCULAR:  Regular.  EXTREMITIES:  Without edema.   Interrogation of her Medtronic [phone] ICD demonstrates a P-wave of 2.7,  impedance of 472, threshold of 1 volt at 0.4.  The R-wave is 6.5 with  impedance of 472, threshold 0.5 at 0.4.  LV impedance was 536, threshold of  1 volt at 0.3.  Battery voltage 3.03.  Heart rate activity is relatively  stable.  OptiVol index is relatively quiescent as well.  I should note that  there is some atrial fibrillation but she is on Coumadin.   IMPRESSION:  1. Congestive heart failure previously class III, now class II.  2. Nonischemic cardiomyopathy.  3. Status post CRT implantable cardioverter defibrillator.  4. Paroxysmal atrial fibrillation by electricograms.   Mrs. Cissell is stable.  She will be seeing you in the next month or two.   Please let us know if there is anything we can further do, otherwise we will  plan to see her again in one year's time.    Sincerely,      ______________________________  Deboraha Sprang, MD, [facility]     SCK/MedQ  DD:  03/14/2006  DT:  03/15/2006  Job #:  [id number]

## 2011-01-12 ENCOUNTER — Encounter: Payer: Self-pay | Admitting: Internal Medicine

## 2011-02-09 ENCOUNTER — Encounter: Payer: Self-pay | Admitting: Internal Medicine

## 2011-02-09 ENCOUNTER — Ambulatory Visit (INDEPENDENT_AMBULATORY_CARE_PROVIDER_SITE_OTHER): Payer: Medicare Other | Admitting: Internal Medicine

## 2011-02-09 VITALS — BP 135/82 | HR 66 | Temp 97.6°F | Ht 62.0 in | Wt 165.0 lb

## 2011-02-09 DIAGNOSIS — E785 Hyperlipidemia, unspecified: Secondary | ICD-10-CM | POA: Insufficient documentation

## 2011-02-09 DIAGNOSIS — T148XXA Other injury of unspecified body region, initial encounter: Secondary | ICD-10-CM

## 2011-02-09 DIAGNOSIS — IMO0001 Reserved for inherently not codable concepts without codable children: Secondary | ICD-10-CM

## 2011-02-09 DIAGNOSIS — S40861A Insect bite (nonvenomous) of right upper arm, initial encounter: Secondary | ICD-10-CM

## 2011-02-09 NOTE — Progress Notes (Signed)
  Subjective:    Patient ID: Casey [name], female    DOB: Feb 07, 1935, 76 y.o.   MRN: [id number]  HPI  patient awakened morning of August 28 and noticed lesion on right dorsal forearm erythematous and swollen. Doesn't recall being bitten by anything. She had some triamcinolone ointment on hand from another physician and she started using that. Says it is not itchy but it has not improved. She became worried because she heard there was a [place] Nile Virus outbreak in the state. This does not look like a mosquito bite. No fever or chills.   Review of Systems     Objective:   Physical Exam  on dorsal aspect mid right forearm there is macular erythema about the size of a $0.50 piece with some surrounding swelling. Lesion is not hot to touch. No definite bite mark seen. No vesicles noted. Lesion best resembles an insect bite-perhaps a spider bite.         Assessment & Plan:   insect bite right forearm  Plan: Continue to use triamcinolone ointment 0.1% 3 times a day until resolved. Samples of Claritin 10 mg daily. Call if not better in 48-72 hours or sooner if worse

## 2011-02-14 ENCOUNTER — Ambulatory Visit (INDEPENDENT_AMBULATORY_CARE_PROVIDER_SITE_OTHER): Payer: Medicare Other | Admitting: Internal Medicine

## 2011-02-14 ENCOUNTER — Ambulatory Visit: Payer: Medicare Other | Admitting: Internal Medicine

## 2011-02-14 ENCOUNTER — Encounter: Payer: Self-pay | Admitting: Internal Medicine

## 2011-02-14 DIAGNOSIS — I4949 Other premature depolarization: Secondary | ICD-10-CM

## 2011-02-14 DIAGNOSIS — I480 Paroxysmal atrial fibrillation: Secondary | ICD-10-CM | POA: Insufficient documentation

## 2011-02-14 DIAGNOSIS — Z95810 Presence of automatic (implantable) cardiac defibrillator: Secondary | ICD-10-CM | POA: Insufficient documentation

## 2011-02-14 DIAGNOSIS — I509 Heart failure, unspecified: Secondary | ICD-10-CM

## 2011-02-14 DIAGNOSIS — I428 Other cardiomyopathies: Secondary | ICD-10-CM

## 2011-02-14 DIAGNOSIS — I4891 Unspecified atrial fibrillation: Secondary | ICD-10-CM

## 2011-02-14 DIAGNOSIS — I493 Ventricular premature depolarization: Secondary | ICD-10-CM | POA: Insufficient documentation

## 2011-02-14 DIAGNOSIS — I472 Ventricular tachycardia: Secondary | ICD-10-CM

## 2011-02-14 DIAGNOSIS — M79606 Pain in leg, unspecified: Secondary | ICD-10-CM

## 2011-02-14 LAB — ICD DEVICE OBSERVATION
AL IMPEDENCE ICD: 544 Ohm
AL THRESHOLD: 1 V
ATRIAL PACING ICD: 3.35 pct
BAMS-0001: 170 {beats}/min
CHARGE TIME: 9.899 s
PACEART VT: 0
RV LEAD AMPLITUDE: 7.6 mv
TOT-0006: 20090727000000
TZAT-0001ATACH: 1
TZAT-0001ATACH: 2
TZAT-0001ATACH: 3
TZAT-0001FASTVT: 1
TZAT-0001SLOWVT: 1
TZAT-0002ATACH: NEGATIVE
TZAT-0002ATACH: NEGATIVE
TZAT-0005SLOWVT: 88 pct
TZAT-0011FASTVT: 10 ms
TZAT-0012ATACH: 150 ms
TZAT-0012ATACH: 150 ms
TZAT-0012FASTVT: 200 ms
TZAT-0012SLOWVT: 200 ms
TZAT-0012SLOWVT: 200 ms
TZAT-0013FASTVT: 1
TZAT-0013SLOWVT: 2
TZAT-0018ATACH: NEGATIVE
TZAT-0018ATACH: NEGATIVE
TZAT-0018FASTVT: NEGATIVE
TZAT-0018SLOWVT: NEGATIVE
TZAT-0019ATACH: 6 V
TZAT-0019ATACH: 6 V
TZAT-0019FASTVT: 8 V
TZAT-0019SLOWVT: 8 V
TZAT-0019SLOWVT: 8 V
TZAT-0020ATACH: 1.5 ms
TZAT-0020ATACH: 1.5 ms
TZAT-0020ATACH: 1.5 ms
TZAT-0020SLOWVT: 1.5 ms
TZON-0003SLOWVT: 380 ms
TZON-0003VSLOWVT: 430 ms
TZST-0001ATACH: 5
TZST-0001FASTVT: 2
TZST-0001FASTVT: 5
TZST-0001SLOWVT: 4
TZST-0001SLOWVT: 5
TZST-0001SLOWVT: 6
TZST-0002ATACH: NEGATIVE
TZST-0002ATACH: NEGATIVE
TZST-0003FASTVT: 35 J
TZST-0003FASTVT: 35 J
TZST-0003SLOWVT: 25 J
TZST-0003SLOWVT: 35 J

## 2011-02-14 NOTE — Progress Notes (Signed)
  HPI  Casey [name] is a 76 y.o. female pt of Dr Wynonia Lawman seen in followup for CHF in the setting of Nonischemic cardiomyopathy for which she is s/p Medtronic CRT implant.  She has no complaints of SOB and no palpitations  SHe is feeling better than she has in many months. She does however report episodes where was walking her right leg becomes heavy. It is relieved by rest. It is unassociated with cramping. On one occasion she actually fell.  Past Medical History  Diagnosis Date  . Cardiomyopathy   . CHF (congestive heart failure)   . Hypertension     No past surgical history on file. ICD   Current Outpatient Prescriptions  Medication Sig Dispense Refill  . aspirin 81 MG tablet Take 81 mg by mouth daily.        . cholecalciferol (VITAMIN D) 1000 UNITS tablet Take 1,000 Units by mouth daily.        Marland Kitchen escitalopram (LEXAPRO) 5 MG tablet Take 2.5 mg by mouth daily.        . furosemide (LASIX) 40 MG tablet Take 40 mg by mouth daily.        Marland Kitchen lisinopril (PRINIVIL,ZESTRIL) 20 MG tablet Take 20 mg by mouth 2 (two) times daily.        . metoprolol (TOPROL-XL) 50 MG 24 hr tablet Take 25 mg by mouth 2 (two) times daily.        . Multiple Vitamins-Calcium (ONE-A-DAY WOMENS FORMULA) TABS Take 1 tablet by mouth daily.        . potassium chloride SA (K-DUR,KLOR-CON) 20 MEQ tablet Take 20 mEq by mouth daily.        . simvastatin (ZOCOR) 80 MG tablet Take 1 tablet (80 mg total) by mouth every evening.  30 tablet  11  . warfarin (COUMADIN) 4 MG tablet Take 4 mg by mouth as directed.          Allergies  Allergen Reactions  . Codeine     Review of Systems negative except from HPI and PMH  Physical Exam Well developed and well nourished in no acute distress HENT normal E scleral and icterus clear Neck Supple JVP flat; carotids brisk and full Clear to ausculation Regular rate and rhythm, no murmurs gallops or rub Soft with active bowel sounds No clubbing cyanosis and edema Alert  and oriented, grossly normal motor and sensory function; I could not elicit reflexes in either knee No pulse was palpated in the right forefoot;  foot was warm Skin Warm and Dry   Assessment and  Plan

## 2011-02-14 NOTE — Assessment & Plan Note (Signed)
She has had intercurrent nonsustained ventricular tachycardia

## 2011-02-14 NOTE — Patient Instructions (Signed)
Your physician wants you to follow-up in: 1 year  You will receive a reminder letter in the mail two months in advance. If you don't receive a letter, please call our office to schedule the follow-up appointment.  Your physician recommends that you continue on your current medications as directed. Please refer to the Current Medication list given to you today.  

## 2011-02-14 NOTE — Assessment & Plan Note (Signed)
The patient's device was interrogated.  The information was reviewed. No changes were made in the programming.    

## 2011-02-14 NOTE — Assessment & Plan Note (Signed)
She has 19% ventricular sense response. The are quite late coupled. There and packed on CRT is hard to quantitate. Given her paucity of symptoms, I wouldn't do anything about them.

## 2011-02-14 NOTE — Assessment & Plan Note (Signed)
She has had intermittent problems with leg heaviness/discomfort with exertion that is relieved by rest. I was not able to detect a decent pulses in her right foot. I suggested ABIs, however, given her lack of symptoms, we'll defer this.

## 2011-02-14 NOTE — Assessment & Plan Note (Signed)
No significant atrial fibrillation is detected. I wonder whether would not be appropriate to discontinue her aspirin as it  has no demonstrable incremental benefit when added to Coumadin; I will defer this to Dr. Wynonia Lawman

## 2011-02-14 NOTE — Assessment & Plan Note (Signed)
Stable on current medications. Recent data speaks to the value of statin therapy in reducing ventricular arrhythmias in patients with nonischemic cardiomyopathy. I wonder however, whether her simvastatin dose might not be reduced. I will defer this to Dr. Wynonia Lawman

## 2011-03-06 ENCOUNTER — Ambulatory Visit (INDEPENDENT_AMBULATORY_CARE_PROVIDER_SITE_OTHER): Payer: Medicare Other | Admitting: Internal Medicine

## 2011-03-06 ENCOUNTER — Encounter: Payer: Self-pay | Admitting: Internal Medicine

## 2011-03-06 DIAGNOSIS — I10 Essential (primary) hypertension: Secondary | ICD-10-CM

## 2011-03-06 DIAGNOSIS — R5383 Other fatigue: Secondary | ICD-10-CM

## 2011-03-06 DIAGNOSIS — Z0000 Encounter for general adult medical examination without abnormal findings: Secondary | ICD-10-CM

## 2011-03-06 DIAGNOSIS — F419 Anxiety disorder, unspecified: Secondary | ICD-10-CM | POA: Insufficient documentation

## 2011-03-06 DIAGNOSIS — M4800 Spinal stenosis, site unspecified: Secondary | ICD-10-CM | POA: Insufficient documentation

## 2011-03-06 DIAGNOSIS — E785 Hyperlipidemia, unspecified: Secondary | ICD-10-CM

## 2011-03-06 DIAGNOSIS — E559 Vitamin D deficiency, unspecified: Secondary | ICD-10-CM | POA: Insufficient documentation

## 2011-03-06 DIAGNOSIS — F411 Generalized anxiety disorder: Secondary | ICD-10-CM

## 2011-03-06 DIAGNOSIS — Z23 Encounter for immunization: Secondary | ICD-10-CM

## 2011-03-06 DIAGNOSIS — H409 Unspecified glaucoma: Secondary | ICD-10-CM | POA: Insufficient documentation

## 2011-03-06 DIAGNOSIS — Q210 Ventricular septal defect: Secondary | ICD-10-CM | POA: Insufficient documentation

## 2011-03-06 DIAGNOSIS — E119 Type 2 diabetes mellitus without complications: Secondary | ICD-10-CM

## 2011-03-06 DIAGNOSIS — R5381 Other malaise: Secondary | ICD-10-CM

## 2011-03-06 LAB — BASIC METABOLIC PANEL
CO2: 26 mEq/L (ref 19–32)
Calcium: 10.3 mg/dL (ref 8.4–10.5)
Creat: 1.05 mg/dL (ref 0.50–1.10)
Glucose, Bld: 106 mg/dL — ABNORMAL HIGH (ref 70–99)
Potassium: 4.1 mEq/L (ref 3.5–5.3)

## 2011-03-06 LAB — HEPATIC FUNCTION PANEL
Albumin: 4.5 g/dL (ref 3.5–5.2)
Alkaline Phosphatase: 108 U/L (ref 39–117)
Total Bilirubin: 1.2 mg/dL (ref 0.3–1.2)

## 2011-03-06 LAB — POCT URINALYSIS DIPSTICK
Blood, UA: NEGATIVE
Ketones, UA: NEGATIVE
Nitrite, UA: NEGATIVE
Protein, UA: NEGATIVE
pH, UA: 5

## 2011-03-06 LAB — LIPID PANEL
Total CHOL/HDL Ratio: 3.4 Ratio
VLDL: 20 mg/dL (ref 0–40)

## 2011-03-06 NOTE — Progress Notes (Signed)
  Subjective:    Patient ID: Casey [name], female    DOB: 03/10/1935, 76 y.o.   MRN: [id number]  HPI pleasant 76 year old black female patient in this practice since 49. History of hysterectomy with bilateral salpingo-oophorectomy February 1990. Took estrogen replacement for a number of years. Had 2 cesarean sections. History of peptic ulcer disease when she was 76 years old. Does not smoke or consume alcohol. Last colonoscopy on file 1999; history of spinal stenosis treated conservatively by Dr. Glenna Fellows many years ago. Right cataract extraction July 2009. History of glaucoma. Hospitalized for lumbar spinal stenosis March 2000. History of ganglion cyst 2 cm in diameter aspirated right leg 1997 by orthopedist, Dr. Larose Kells. Implantable defibrillator insertion 2005, had new defibrillator placed July 2009. Last mammogram April 2012. Pneumovax immunization March 2011, Tdap Vaccine March 2011, annual influenza immunization. Infected epidermoid cyst. i incised and drained December 2010. Is on statin medication for hyperlipidemia. Is on Coumadin. Took Lexapro for a while during the time her mother was ill but has been able to stop that medication.  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  Mother with history of stroke, MI, diabetes and hypertension. Father died with cancer of the esophagus. One brother died reportedly of ? Heart issues    Review of Systems  Constitutional: Negative.   HENT: Negative.   Eyes: Negative.   Cardiovascular: Negative.   Gastrointestinal: Negative.   Genitourinary: Negative.   Musculoskeletal: Negative.   Neurological: Negative.   Hematological: Negative.   Psychiatric/Behavioral: Negative.        Objective:   Physical Exam  Vitals reviewed. Constitutional: She is oriented to person, place, and time. No distress.  HENT:  Head: Normocephalic and atraumatic.  Right Ear: External ear normal.  Left Ear: External ear  normal.  Mouth/Throat: Oropharynx is clear and moist.  Eyes: Conjunctivae and EOM are normal. Pupils are equal, round, and reactive to light. No scleral icterus.  Neck: Neck supple. No JVD present. No thyromegaly present.  Cardiovascular: Normal rate, regular rhythm and intact distal pulses.   Murmur heard.      2/6 systolic ejection murmur  Pulmonary/Chest: Effort normal. No respiratory distress. She has no wheezes. She has no rales.  Abdominal: Soft. Bowel sounds are normal. She exhibits no distension and no mass. There is no tenderness. There is no rebound and no guarding.  Genitourinary:       Deferred status post hysterectomy  Musculoskeletal: She exhibits no edema.  Lymphadenopathy:    She has no cervical adenopathy.  Neurological: She is alert and oriented to person, place, and time. She has normal reflexes. Coordination normal.  Skin: Skin is warm and dry. No rash noted.  Psychiatric: She has a normal mood and affect.          Assessment & Plan:  History of ventricular septal defect  History of cardiomyopathy  Hyperlipidemia  Spinal stenosis  Adult onset diabetes mellitus-diet controlled  History of anxiety/depression  History of glaucoma  History of vitamin D deficiency  History of implantable defibrillator placement  Chronic Coumadin therapy  History of fractured left tibia proximally 1996  Right cataract extraction July 2009  Total abdominal hysterectomy/BSO 1990  Hyperlipidemia  Patient is to return in 6 months at which time she'll have fasting lipid panel liver functions and hemoglobin A1c with office visit. Influenza immunization given today

## 2011-03-07 ENCOUNTER — Encounter: Payer: Medicare Other | Admitting: Internal Medicine

## 2011-03-07 LAB — VITAMIN D 25 HYDROXY (VIT D DEFICIENCY, FRACTURES): Vit D, 25-Hydroxy: 38 ng/mL (ref 30–89)

## 2011-03-09 LAB — CBC
MCHC: 33.6
MCV: 98.3
RBC: 4.59
RDW: 12.1
WBC: 6.2

## 2011-03-09 LAB — BASIC METABOLIC PANEL
BUN: 14
Calcium: 10.2
Creatinine, Ser: 1.06
Glucose, Bld: 89
Potassium: 3.9

## 2011-03-09 LAB — URINALYSIS, ROUTINE W REFLEX MICROSCOPIC
Bilirubin Urine: NEGATIVE
Ketones, ur: NEGATIVE
Nitrite: NEGATIVE
Protein, ur: NEGATIVE
Urobilinogen, UA: 1
pH: 5.5

## 2011-03-09 LAB — APTT: aPTT: 28

## 2011-06-19 DIAGNOSIS — Z7901 Long term (current) use of anticoagulants: Secondary | ICD-10-CM | POA: Diagnosis not present

## 2011-06-19 DIAGNOSIS — Q210 Ventricular septal defect: Secondary | ICD-10-CM | POA: Diagnosis not present

## 2011-06-19 DIAGNOSIS — Z95810 Presence of automatic (implantable) cardiac defibrillator: Secondary | ICD-10-CM | POA: Diagnosis not present

## 2011-06-19 DIAGNOSIS — I509 Heart failure, unspecified: Secondary | ICD-10-CM | POA: Diagnosis not present

## 2011-06-19 DIAGNOSIS — I501 Left ventricular failure: Secondary | ICD-10-CM | POA: Diagnosis not present

## 2011-06-19 DIAGNOSIS — I428 Other cardiomyopathies: Secondary | ICD-10-CM | POA: Diagnosis not present

## 2011-07-17 DIAGNOSIS — I428 Other cardiomyopathies: Secondary | ICD-10-CM | POA: Diagnosis not present

## 2011-07-17 DIAGNOSIS — Z95810 Presence of automatic (implantable) cardiac defibrillator: Secondary | ICD-10-CM | POA: Diagnosis not present

## 2011-07-17 DIAGNOSIS — I509 Heart failure, unspecified: Secondary | ICD-10-CM | POA: Diagnosis not present

## 2011-07-17 DIAGNOSIS — I501 Left ventricular failure: Secondary | ICD-10-CM | POA: Diagnosis not present

## 2011-07-17 DIAGNOSIS — Z7901 Long term (current) use of anticoagulants: Secondary | ICD-10-CM | POA: Diagnosis not present

## 2011-07-17 DIAGNOSIS — Q210 Ventricular septal defect: Secondary | ICD-10-CM | POA: Diagnosis not present

## 2011-07-18 ENCOUNTER — Ambulatory Visit (INDEPENDENT_AMBULATORY_CARE_PROVIDER_SITE_OTHER): Payer: Medicare Other | Admitting: Internal Medicine

## 2011-07-18 ENCOUNTER — Ambulatory Visit
Admission: RE | Admit: 2011-07-18 | Discharge: 2011-07-18 | Disposition: A | Discharge status: Home / Self Care | Payer: Medicare Other | Source: Ambulatory Visit | Attending: Internal Medicine | Admitting: Internal Medicine

## 2011-07-18 ENCOUNTER — Encounter: Payer: Self-pay | Admitting: Internal Medicine

## 2011-07-18 VITALS — BP 114/76 | HR 80 | Temp 97.6°F | Wt 163.0 lb

## 2011-07-18 DIAGNOSIS — M79673 Pain in unspecified foot: Secondary | ICD-10-CM

## 2011-07-18 DIAGNOSIS — M109 Gout, unspecified: Secondary | ICD-10-CM | POA: Insufficient documentation

## 2011-07-18 DIAGNOSIS — M79609 Pain in unspecified limb: Secondary | ICD-10-CM | POA: Diagnosis not present

## 2011-07-18 DIAGNOSIS — M19079 Primary osteoarthritis, unspecified ankle and foot: Secondary | ICD-10-CM | POA: Diagnosis not present

## 2011-07-18 LAB — CBC WITH DIFFERENTIAL/PLATELET
Basophils Relative: 0 % (ref 0–1)
Eosinophils Relative: 2 % (ref 0–5)
HCT: 43.7 % (ref 36.0–46.0)
Hemoglobin: 14.7 g/dL (ref 12.0–15.0)
Lymphocytes Relative: 23 % (ref 12–46)
MCHC: 33.6 g/dL (ref 30.0–36.0)
MCV: 98 fL (ref 78.0–100.0)
Monocytes Absolute: 0.7 10*3/uL (ref 0.1–1.0)
Monocytes Relative: 9 % (ref 3–12)
Neutro Abs: 5.1 10*3/uL (ref 1.7–7.7)
RDW: 12.2 % (ref 11.5–15.5)

## 2011-07-18 NOTE — Patient Instructions (Signed)
Take prednisone dosepak as directed for 6 days. Then start allopurinol 100 mg daily to prevent recurrent attacks of gout. Lab work drawn today include uric acid level and CBC.

## 2011-07-18 NOTE — Progress Notes (Signed)
Addended by: Brett Canales on: 07/18/2011 04:08 PM   Modules accepted: Orders

## 2011-07-18 NOTE — Progress Notes (Signed)
  Subjective:    Patient ID: Casey [name], female    DOB: Oct 11, 1934, 77 y.o.   MRN: [id number]  HPI 77 year old black female called with complaint of pain and swelling right foot MTP joint and IP joint for one week. Hurts for bed sheet to touch her foot. No fever or chills. Never had anything like this before. Sent patient for x-ray showing mild first MTP joint osteoarthritis. Denies eating lots of red meat, does not consume alcohol. Does not eat organ meats. Never had an attack like this before. No recent illnesses. No surgeries recently.    Review of Systems     Objective:   Physical Exam  increased warmth right first MTP joint with swelling and tenderness. Also swelling of IP joint with tenderness.        Assessment & Plan:  Probable acute gout attack  Plan: CBC with differential and uric acid level drawn. Patient to start Sterapred DS 10 mg 6 day dosepak. Does not want prescription for pain medication. Keep foot elevated. Once prednisone dosepak is finished are allopurinol 100 mg daily.

## 2011-07-25 DIAGNOSIS — H4011X0 Primary open-angle glaucoma, stage unspecified: Secondary | ICD-10-CM | POA: Diagnosis not present

## 2011-08-14 DIAGNOSIS — I501 Left ventricular failure: Secondary | ICD-10-CM | POA: Diagnosis not present

## 2011-08-14 DIAGNOSIS — I428 Other cardiomyopathies: Secondary | ICD-10-CM | POA: Diagnosis not present

## 2011-08-14 DIAGNOSIS — Q210 Ventricular septal defect: Secondary | ICD-10-CM | POA: Diagnosis not present

## 2011-08-14 DIAGNOSIS — I509 Heart failure, unspecified: Secondary | ICD-10-CM | POA: Diagnosis not present

## 2011-08-14 DIAGNOSIS — Z95810 Presence of automatic (implantable) cardiac defibrillator: Secondary | ICD-10-CM | POA: Diagnosis not present

## 2011-08-14 DIAGNOSIS — Z7901 Long term (current) use of anticoagulants: Secondary | ICD-10-CM | POA: Diagnosis not present

## 2011-08-22 DIAGNOSIS — Z79899 Other long term (current) drug therapy: Secondary | ICD-10-CM | POA: Diagnosis not present

## 2011-08-22 DIAGNOSIS — I428 Other cardiomyopathies: Secondary | ICD-10-CM | POA: Diagnosis not present

## 2011-08-22 DIAGNOSIS — Z95810 Presence of automatic (implantable) cardiac defibrillator: Secondary | ICD-10-CM | POA: Diagnosis not present

## 2011-08-22 DIAGNOSIS — I501 Left ventricular failure: Secondary | ICD-10-CM | POA: Diagnosis not present

## 2011-08-22 DIAGNOSIS — Z7901 Long term (current) use of anticoagulants: Secondary | ICD-10-CM | POA: Diagnosis not present

## 2011-08-22 DIAGNOSIS — I509 Heart failure, unspecified: Secondary | ICD-10-CM | POA: Diagnosis not present

## 2011-08-22 DIAGNOSIS — Q210 Ventricular septal defect: Secondary | ICD-10-CM | POA: Diagnosis not present

## 2011-08-29 ENCOUNTER — Other Ambulatory Visit: Payer: Self-pay | Admitting: Internal Medicine

## 2011-08-29 DIAGNOSIS — Z1231 Encounter for screening mammogram for malignant neoplasm of breast: Secondary | ICD-10-CM

## 2011-09-04 ENCOUNTER — Other Ambulatory Visit: Payer: Medicare Other | Admitting: Internal Medicine

## 2011-09-05 ENCOUNTER — Ambulatory Visit: Payer: Medicare Other | Admitting: Internal Medicine

## 2011-09-05 ENCOUNTER — Other Ambulatory Visit (INDEPENDENT_AMBULATORY_CARE_PROVIDER_SITE_OTHER): Payer: Medicare Other | Admitting: Internal Medicine

## 2011-09-05 DIAGNOSIS — E785 Hyperlipidemia, unspecified: Secondary | ICD-10-CM

## 2011-09-05 DIAGNOSIS — Z79899 Other long term (current) drug therapy: Secondary | ICD-10-CM | POA: Diagnosis not present

## 2011-09-05 LAB — LIPID PANEL
Cholesterol: 192 mg/dL (ref 0–200)
LDL Cholesterol: 120 mg/dL — ABNORMAL HIGH (ref 0–99)
Total CHOL/HDL Ratio: 3.9 Ratio
Triglycerides: 117 mg/dL (ref <150)
VLDL: 23 mg/dL (ref 0–40)

## 2011-09-05 LAB — HEPATIC FUNCTION PANEL
ALT: 14 U/L (ref 0–35)
Total Protein: 6.5 g/dL (ref 6.0–8.3)

## 2011-09-07 ENCOUNTER — Encounter: Payer: Self-pay | Admitting: Internal Medicine

## 2011-09-07 ENCOUNTER — Ambulatory Visit (INDEPENDENT_AMBULATORY_CARE_PROVIDER_SITE_OTHER): Payer: Medicare Other | Admitting: Internal Medicine

## 2011-09-07 VITALS — BP 146/88 | HR 92 | Temp 98.7°F | Wt 169.5 lb

## 2011-09-07 DIAGNOSIS — I10 Essential (primary) hypertension: Secondary | ICD-10-CM

## 2011-09-07 DIAGNOSIS — Z1211 Encounter for screening for malignant neoplasm of colon: Secondary | ICD-10-CM

## 2011-09-07 DIAGNOSIS — E119 Type 2 diabetes mellitus without complications: Secondary | ICD-10-CM | POA: Diagnosis not present

## 2011-09-07 NOTE — Progress Notes (Signed)
  Subjective:    Patient ID: Casey [name], female    DOB: 10/22/1934, 77 y.o.   MRN: [id number]  HPI 77 year old black female with history of idiopathic cardiomyopathy, ventricular septal defect, hyperlipidemia, diabetes mellitus, anxiety, glaucoma in both eyes and vitamin D deficiency for six-month recheck. Patient has history of adenomatous polyp 1991. Had colonoscopy by Dr. Amedeo Plenty 1999 and doesn't want to have another one. Have given her 3 Hemoccult cards today. Gets annual mammogram. Has an implantable defibrillator which was placed in 2005 and got a new one in 2009. History of infected epidermoid cyst December 2010. Episode of gout in her MTP joint a while back that has resolved but left some postinflammatory hyperpigmentation. Patient is worried about her husband who continues to smoke and has COPD. Also she says he is getting forgetful. Patient is status post total abdominal hysterectomy 1990, surgery a left lower extremity 1996, right cataract extraction 2009. History of admission to hospital for spinal stenosis and herniated disc L3-L4 and L4-L5 but never had surgery.    Review of Systems     Objective:   Physical Exam she is on maximum on Zocor therapy at 80 mg daily. Chest clear to auscultation, cardiac exam regular rate and rhythm normal S1 and S2; extremities without edema. She is on chronic Coumadin therapy. Spoke with her today about green leafy vegetables interfering with pro time. She likes lettuce and broccoli. Still has elevated LDL despite 80 mg of Zocor daily. At this point, I think diet is about the best we can do. She doesn't have a lot of weight to lose. Needs to be on statin because of multiple medical problems. This includes diabetes. Hemoglobin A1c not ordered but will be added. Eyes are red bilaterally. Says they become red when she fails putting her glaucoma drops. LDL cholesterol is now 120 and previously was 124. Total cholesterol 195.        Assessment & Plan:    Idiopathic cardiomyopathy  Diabetes mellitus  Hyperlipidemia  Ventricular septal defect  Block, both eyes  Implantable defibrillator  Plan: 3 Hemoccult cards given. Return in 6 months for physical exam. Add hemoglobin A1c.

## 2011-09-07 NOTE — Patient Instructions (Addendum)
Continue same medications. Return in 6 months for physical exam or sooner if need be.

## 2011-09-11 DIAGNOSIS — I509 Heart failure, unspecified: Secondary | ICD-10-CM | POA: Diagnosis not present

## 2011-09-11 DIAGNOSIS — I501 Left ventricular failure: Secondary | ICD-10-CM | POA: Diagnosis not present

## 2011-09-11 DIAGNOSIS — Q210 Ventricular septal defect: Secondary | ICD-10-CM | POA: Diagnosis not present

## 2011-09-11 DIAGNOSIS — Z95810 Presence of automatic (implantable) cardiac defibrillator: Secondary | ICD-10-CM | POA: Diagnosis not present

## 2011-09-11 DIAGNOSIS — I428 Other cardiomyopathies: Secondary | ICD-10-CM | POA: Diagnosis not present

## 2011-09-11 DIAGNOSIS — Z7901 Long term (current) use of anticoagulants: Secondary | ICD-10-CM | POA: Diagnosis not present

## 2011-09-12 ENCOUNTER — Other Ambulatory Visit: Payer: Medicare Other | Admitting: Internal Medicine

## 2011-09-12 DIAGNOSIS — E119 Type 2 diabetes mellitus without complications: Secondary | ICD-10-CM | POA: Diagnosis not present

## 2011-09-13 LAB — HEMOGLOBIN A1C
Hgb A1c MFr Bld: 6.2 % — ABNORMAL HIGH (ref <5.7)
Mean Plasma Glucose: 131 mg/dL — ABNORMAL HIGH (ref <117)

## 2011-09-20 ENCOUNTER — Ambulatory Visit
Admission: RE | Admit: 2011-09-20 | Discharge: 2011-09-20 | Disposition: A | Discharge status: Home / Self Care | Payer: Medicare Other | Source: Ambulatory Visit | Attending: Internal Medicine | Admitting: Internal Medicine

## 2011-09-20 DIAGNOSIS — Z1231 Encounter for screening mammogram for malignant neoplasm of breast: Secondary | ICD-10-CM

## 2011-09-25 DIAGNOSIS — H4011X0 Primary open-angle glaucoma, stage unspecified: Secondary | ICD-10-CM | POA: Diagnosis not present

## 2011-10-09 DIAGNOSIS — Q210 Ventricular septal defect: Secondary | ICD-10-CM | POA: Diagnosis not present

## 2011-10-09 DIAGNOSIS — I509 Heart failure, unspecified: Secondary | ICD-10-CM | POA: Diagnosis not present

## 2011-10-09 DIAGNOSIS — Z7901 Long term (current) use of anticoagulants: Secondary | ICD-10-CM | POA: Diagnosis not present

## 2011-10-09 DIAGNOSIS — I428 Other cardiomyopathies: Secondary | ICD-10-CM | POA: Diagnosis not present

## 2011-10-09 DIAGNOSIS — I501 Left ventricular failure: Secondary | ICD-10-CM | POA: Diagnosis not present

## 2011-10-09 DIAGNOSIS — Z95810 Presence of automatic (implantable) cardiac defibrillator: Secondary | ICD-10-CM | POA: Diagnosis not present

## 2011-11-08 DIAGNOSIS — Z7901 Long term (current) use of anticoagulants: Secondary | ICD-10-CM | POA: Diagnosis not present

## 2011-11-08 DIAGNOSIS — Q210 Ventricular septal defect: Secondary | ICD-10-CM | POA: Diagnosis not present

## 2011-11-08 DIAGNOSIS — I428 Other cardiomyopathies: Secondary | ICD-10-CM | POA: Diagnosis not present

## 2011-11-08 DIAGNOSIS — I501 Left ventricular failure: Secondary | ICD-10-CM | POA: Diagnosis not present

## 2011-11-08 DIAGNOSIS — Z95810 Presence of automatic (implantable) cardiac defibrillator: Secondary | ICD-10-CM | POA: Diagnosis not present

## 2011-11-08 DIAGNOSIS — I509 Heart failure, unspecified: Secondary | ICD-10-CM | POA: Diagnosis not present

## 2011-11-21 ENCOUNTER — Other Ambulatory Visit: Payer: Self-pay | Admitting: Cardiology

## 2011-11-21 DIAGNOSIS — I509 Heart failure, unspecified: Secondary | ICD-10-CM | POA: Diagnosis not present

## 2011-11-21 DIAGNOSIS — Q210 Ventricular septal defect: Secondary | ICD-10-CM | POA: Diagnosis not present

## 2011-11-21 DIAGNOSIS — I428 Other cardiomyopathies: Secondary | ICD-10-CM | POA: Diagnosis not present

## 2011-11-21 DIAGNOSIS — Z7901 Long term (current) use of anticoagulants: Secondary | ICD-10-CM | POA: Diagnosis not present

## 2011-11-21 DIAGNOSIS — Z79899 Other long term (current) drug therapy: Secondary | ICD-10-CM | POA: Diagnosis not present

## 2011-11-21 DIAGNOSIS — Z95810 Presence of automatic (implantable) cardiac defibrillator: Secondary | ICD-10-CM | POA: Diagnosis not present

## 2011-11-21 DIAGNOSIS — I501 Left ventricular failure: Secondary | ICD-10-CM | POA: Diagnosis not present

## 2011-11-21 NOTE — Progress Notes (Signed)
Casey [name], Casey [name]  Date of visit:  11/21/2011 DOB:  04-20-1935    Age:  77 yrs. Medical record number:  [id number]     Account number:  [id number] Primary Care Provider: [facility] ____________________________ CURRENT DIAGNOSES  1. Cardiomyopathy Idiopathic  2. Long-Term (Current) Use Of Anticoagulants  3. Congestive Heart Failure Left  4. Interventricular Septal Defect  5. Long-term (current) Use Of Other Medications  6. AICD in situ  7. Congestive Heart Failure ____________________________ ALLERGIES  Candesartan Cilexetil, Nausea and vomiting  Codeine Sulf, Rash  Hydralazine HCl, Lethargy  Isosorbide Dinitrate, Lethargy ____________________________ MEDICATIONS  1. warfarin 4 mg Tablet, 1 p.o. daily  2. simvastatin 80 mg Tablet, 1 p.o. daily  3. Vitamin D3 400 unit Capsule, 1 p.o. daily  4. Klor-Con M20 20 mEq tablet,ER particles/crystals, 1 p.o. q.d.  5. lisinopril 20 mg tablet, BID  6. metoprolol tartrate 50 mg tablet, 1/2 tab b.i.d.  7. furosemide 40 mg tablet, 1 p.o. daily  8. allopurinol 100 mg tablet, 1 p.o. daily ____________________________ CHIEF COMPLAINTS  Followup of Cardiomyopathy Idiopathic  Followup of Congestive Heart Failure ____________________________ HISTORY OF PRESENT ILLNESS  Patient seen for cardiac followup. She has been doing well since she was previously here. She denies angina and has no PND, orthopnea, syncope, palpitations, or claudication. She is having a lot of situational stress with her husband and his health. She has recently been diagnosed with gallop and also complains of some pain and numbness of the upper part of her right leg. She's not really getting much in the way of regular exercise. She has had no bleeding complications from warfarin. She has had no defibrillator discharges.  ____________________________ PAST HISTORY  Past Medical Illnesses:  history of peptic ulcer disease, lumbar disc disease, left arm  DVT 12/07, DM-diet  controlled, gout;  Cardiovascular Illnesses:  cardiomyopathy(idiopathic), CHF, insertion of biventricular defibrillator 6/05;  Surgical Procedures:  cesarean section, hysterectomy, pin rt femur, Medtronic AICD implant June 2005;  Cardiology Procedures-Invasive:  cardiac cath (left) May 2005, replacement of fractured defibrillator lead 11/07, Medtronic generator change July 2009;  Cardiology Procedures-Noninvasive:  echocardiogram November 2009, echocardiogram June 2011;  Cardiac Cath Results:  normal coronary arteries, global hypokinesis;  LVEF of 15% documented via echocardiogram on 12/01/2009 ____________________________ CARDIO-PULMONARY TEST DATES EKG Date:  12/05/2010;   Cardiac Cath Date:  11/02/2003;  Echocardiography Date: 12/01/2009;  Chest Xray Date: 11/29/2004;   ____________________________ SOCIAL HISTORY Alcohol Use:  no alcohol use;  Smoking:  used to smoke but quit;  Diet:  regular diet;  Lifestyle:  married, 1 daughter and 1 son;  Exercise:  no regular exercise;  Occupation:  retired and Surveyor, [employer];  Residence:  lives with husband;   ____________________________ REVIEW OF SYSTEMS General:  denies recent weight change, fatique or change in exercise tolerance.Eyes:  wears eye glasses/contact lenses, decreased acuity O.D.   Respiratory:  denies dyspnea, cough, wheezing or hemoptysis.Cardiovascular:  please review HPI  Abdominal:  denies dyspepsia, GI bleeding, constipation, or diarrhea  Genitourinary-Female:  no dysuria, urgency, frequency, UTIs, or stress incontinence  Musculoskeletal:  chronic low back pain, gout  Psychiatric:  situational stress ____________________________ PHYSICAL EXAMINATION VITAL SIGNS  Blood Pressure:  130/80 Sitting, Left arm, regular cuff  , 146/88 Standing, Left arm and regular cuff   Pulse:  80/min. Weight:  167.00 lbs. Height:  62"BMI: 30  Constitutional:  pleasant African American female in no acute distress, mildly obese Skin:  warm and dry to touch, no  apparent skin lesions, or  masses noted. Head:  normocephalic, normal hair pattern, no masses or tenderness ENT:  ears, nose and throat reveal no gross abnormalities.  Dentition good. Neck:  supple, no masses, thyromegaly, JVD. Carotid pulses are full and equal bilaterally without bruits. Chest:  clear to auscultation and percussion, normal A-P diameter, healed ICD incision in the left pectoral area Cardiac:  S1,S2, no S3 or S4 present, regular rhythm, grade 2/6 early systolic murmur left sternal border Peripheral Pulses:  the femoral,dorsalis pedis, and posterior tibial pulses are full and equal bilaterally with no bruits auscultated. Extremities & Back:  no deformities, clubbing, cyanosis, erythema or edema observed. Normal muscle strength and tone. Neurological:  no gross motor or sensory deficits noted, affect appropriate, oriented x3. ____________________________ MOST RECENT LIPID PANEL 09/05/11  CHOL TOTL 192 mg/dl, LDL 120 calc, HDL 49 mg/dl, TRIGLYCER 117 mg/dl and CHOL/HDL 3.9 (Calc) ____________________________ IMPRESSIONS/PLAN  1. Idiopathic cardiomyopathy class II clinically stable 2. Hyperlipidemia slightly above goal 3. Functioning implantable defibrillator 4. Long-term anticoagulation with warfarin 5. Obesity with need to lose weight  Recommendations:  Obtain echocardiogram to assess LV function. Laboratory work was reviewed from primary physician. She is clinically doing well and will return in 6 months for a visit. Discussed importance of losing weight. ____________________________ TODAYS ORDERS  1. 2D, color flow, doppler: First Available  2. Return Visit: 6 months  3. 12 Lead EKG: 6 months                       ____________________________ Cardiology Physician:  Kerry Hough MD [facility]

## 2011-11-22 DIAGNOSIS — I509 Heart failure, unspecified: Secondary | ICD-10-CM | POA: Diagnosis not present

## 2011-11-24 DIAGNOSIS — I509 Heart failure, unspecified: Secondary | ICD-10-CM | POA: Diagnosis not present

## 2011-11-24 DIAGNOSIS — I501 Left ventricular failure: Secondary | ICD-10-CM | POA: Diagnosis not present

## 2011-11-24 DIAGNOSIS — Q210 Ventricular septal defect: Secondary | ICD-10-CM | POA: Diagnosis not present

## 2011-11-24 DIAGNOSIS — Z95810 Presence of automatic (implantable) cardiac defibrillator: Secondary | ICD-10-CM | POA: Diagnosis not present

## 2011-11-24 DIAGNOSIS — I428 Other cardiomyopathies: Secondary | ICD-10-CM | POA: Diagnosis not present

## 2011-11-24 DIAGNOSIS — Z79899 Other long term (current) drug therapy: Secondary | ICD-10-CM | POA: Diagnosis not present

## 2011-11-24 DIAGNOSIS — Z7901 Long term (current) use of anticoagulants: Secondary | ICD-10-CM | POA: Diagnosis not present

## 2011-11-27 ENCOUNTER — Other Ambulatory Visit: Payer: Self-pay | Admitting: Cardiology

## 2011-11-27 ENCOUNTER — Other Ambulatory Visit: Payer: Self-pay | Admitting: Internal Medicine

## 2011-12-06 DIAGNOSIS — Z7901 Long term (current) use of anticoagulants: Secondary | ICD-10-CM | POA: Diagnosis not present

## 2011-12-06 DIAGNOSIS — Q210 Ventricular septal defect: Secondary | ICD-10-CM | POA: Diagnosis not present

## 2011-12-06 DIAGNOSIS — I428 Other cardiomyopathies: Secondary | ICD-10-CM | POA: Diagnosis not present

## 2011-12-06 DIAGNOSIS — I509 Heart failure, unspecified: Secondary | ICD-10-CM | POA: Diagnosis not present

## 2011-12-06 DIAGNOSIS — Z95810 Presence of automatic (implantable) cardiac defibrillator: Secondary | ICD-10-CM | POA: Diagnosis not present

## 2011-12-06 DIAGNOSIS — I501 Left ventricular failure: Secondary | ICD-10-CM | POA: Diagnosis not present

## 2011-12-26 DIAGNOSIS — H4011X0 Primary open-angle glaucoma, stage unspecified: Secondary | ICD-10-CM | POA: Diagnosis not present

## 2012-01-03 DIAGNOSIS — Z7901 Long term (current) use of anticoagulants: Secondary | ICD-10-CM | POA: Diagnosis not present

## 2012-01-03 DIAGNOSIS — I501 Left ventricular failure: Secondary | ICD-10-CM | POA: Diagnosis not present

## 2012-01-03 DIAGNOSIS — I428 Other cardiomyopathies: Secondary | ICD-10-CM | POA: Diagnosis not present

## 2012-01-03 DIAGNOSIS — Q210 Ventricular septal defect: Secondary | ICD-10-CM | POA: Diagnosis not present

## 2012-01-03 DIAGNOSIS — I509 Heart failure, unspecified: Secondary | ICD-10-CM | POA: Diagnosis not present

## 2012-01-03 DIAGNOSIS — Z95810 Presence of automatic (implantable) cardiac defibrillator: Secondary | ICD-10-CM | POA: Diagnosis not present

## 2012-01-31 DIAGNOSIS — Q210 Ventricular septal defect: Secondary | ICD-10-CM | POA: Diagnosis not present

## 2012-01-31 DIAGNOSIS — Z95810 Presence of automatic (implantable) cardiac defibrillator: Secondary | ICD-10-CM | POA: Diagnosis not present

## 2012-01-31 DIAGNOSIS — I428 Other cardiomyopathies: Secondary | ICD-10-CM | POA: Diagnosis not present

## 2012-01-31 DIAGNOSIS — Z7901 Long term (current) use of anticoagulants: Secondary | ICD-10-CM | POA: Diagnosis not present

## 2012-01-31 DIAGNOSIS — I509 Heart failure, unspecified: Secondary | ICD-10-CM | POA: Diagnosis not present

## 2012-01-31 DIAGNOSIS — I501 Left ventricular failure: Secondary | ICD-10-CM | POA: Diagnosis not present

## 2012-02-14 DIAGNOSIS — Z95810 Presence of automatic (implantable) cardiac defibrillator: Secondary | ICD-10-CM | POA: Diagnosis not present

## 2012-02-14 DIAGNOSIS — I428 Other cardiomyopathies: Secondary | ICD-10-CM | POA: Diagnosis not present

## 2012-02-14 DIAGNOSIS — I501 Left ventricular failure: Secondary | ICD-10-CM | POA: Diagnosis not present

## 2012-02-14 DIAGNOSIS — Z7901 Long term (current) use of anticoagulants: Secondary | ICD-10-CM | POA: Diagnosis not present

## 2012-02-14 DIAGNOSIS — Q210 Ventricular septal defect: Secondary | ICD-10-CM | POA: Diagnosis not present

## 2012-02-14 DIAGNOSIS — I509 Heart failure, unspecified: Secondary | ICD-10-CM | POA: Diagnosis not present

## 2012-03-04 DIAGNOSIS — Q210 Ventricular septal defect: Secondary | ICD-10-CM | POA: Diagnosis not present

## 2012-03-04 DIAGNOSIS — I501 Left ventricular failure: Secondary | ICD-10-CM | POA: Diagnosis not present

## 2012-03-04 DIAGNOSIS — Z7901 Long term (current) use of anticoagulants: Secondary | ICD-10-CM | POA: Diagnosis not present

## 2012-03-04 DIAGNOSIS — I509 Heart failure, unspecified: Secondary | ICD-10-CM | POA: Diagnosis not present

## 2012-03-04 DIAGNOSIS — Z79899 Other long term (current) drug therapy: Secondary | ICD-10-CM | POA: Diagnosis not present

## 2012-03-04 DIAGNOSIS — I428 Other cardiomyopathies: Secondary | ICD-10-CM | POA: Diagnosis not present

## 2012-03-04 DIAGNOSIS — Z95810 Presence of automatic (implantable) cardiac defibrillator: Secondary | ICD-10-CM | POA: Diagnosis not present

## 2012-03-11 ENCOUNTER — Other Ambulatory Visit: Payer: Medicare Other | Admitting: Internal Medicine

## 2012-03-11 DIAGNOSIS — E785 Hyperlipidemia, unspecified: Secondary | ICD-10-CM | POA: Diagnosis not present

## 2012-03-11 DIAGNOSIS — E119 Type 2 diabetes mellitus without complications: Secondary | ICD-10-CM

## 2012-03-11 DIAGNOSIS — E039 Hypothyroidism, unspecified: Secondary | ICD-10-CM

## 2012-03-11 DIAGNOSIS — I10 Essential (primary) hypertension: Secondary | ICD-10-CM

## 2012-03-11 DIAGNOSIS — E559 Vitamin D deficiency, unspecified: Secondary | ICD-10-CM | POA: Diagnosis not present

## 2012-03-11 LAB — CBC WITH DIFFERENTIAL/PLATELET
Basophils Absolute: 0.1 10*3/uL (ref 0.0–0.1)
Basophils Relative: 2 % — ABNORMAL HIGH (ref 0–1)
MCHC: 35.2 g/dL (ref 30.0–36.0)
Neutro Abs: 2.5 10*3/uL (ref 1.7–7.7)
Neutrophils Relative %: 52 % (ref 43–77)
Platelets: 120 10*3/uL — ABNORMAL LOW (ref 150–400)
RDW: 13.2 % (ref 11.5–15.5)
WBC: 4.7 10*3/uL (ref 4.0–10.5)

## 2012-03-11 LAB — COMPREHENSIVE METABOLIC PANEL
ALT: 19 U/L (ref 0–35)
AST: 23 U/L (ref 0–37)
Albumin: 4.3 g/dL (ref 3.5–5.2)
Alkaline Phosphatase: 106 U/L (ref 39–117)
BUN: 13 mg/dL (ref 6–23)
Potassium: 4.1 mEq/L (ref 3.5–5.3)
Sodium: 142 mEq/L (ref 135–145)
Total Protein: 6.9 g/dL (ref 6.0–8.3)

## 2012-03-11 LAB — LIPID PANEL
HDL: 53 mg/dL (ref >39)
LDL Cholesterol: 104 mg/dL — ABNORMAL HIGH (ref 0–99)

## 2012-03-11 LAB — TSH: TSH: 1.809 u[IU]/mL (ref 0.350–4.500)

## 2012-03-11 LAB — HEMOGLOBIN A1C: Mean Plasma Glucose: 128 mg/dL — ABNORMAL HIGH (ref <117)

## 2012-03-12 ENCOUNTER — Ambulatory Visit (INDEPENDENT_AMBULATORY_CARE_PROVIDER_SITE_OTHER): Payer: Medicare Other | Admitting: Internal Medicine

## 2012-03-12 ENCOUNTER — Encounter: Payer: Self-pay | Admitting: Internal Medicine

## 2012-03-12 VITALS — BP 142/82 | HR 76 | Temp 97.8°F | Ht 62.5 in | Wt 167.0 lb

## 2012-03-12 DIAGNOSIS — Z0000 Encounter for general adult medical examination without abnormal findings: Secondary | ICD-10-CM

## 2012-03-12 DIAGNOSIS — E8881 Metabolic syndrome: Secondary | ICD-10-CM

## 2012-03-12 DIAGNOSIS — E119 Type 2 diabetes mellitus without complications: Secondary | ICD-10-CM | POA: Diagnosis not present

## 2012-03-12 DIAGNOSIS — Z23 Encounter for immunization: Secondary | ICD-10-CM | POA: Diagnosis not present

## 2012-03-12 DIAGNOSIS — I10 Essential (primary) hypertension: Secondary | ICD-10-CM

## 2012-03-12 DIAGNOSIS — I428 Other cardiomyopathies: Secondary | ICD-10-CM

## 2012-03-12 DIAGNOSIS — Q210 Ventricular septal defect: Secondary | ICD-10-CM

## 2012-03-12 DIAGNOSIS — E785 Hyperlipidemia, unspecified: Secondary | ICD-10-CM | POA: Diagnosis not present

## 2012-03-12 DIAGNOSIS — M4800 Spinal stenosis, site unspecified: Secondary | ICD-10-CM

## 2012-03-12 LAB — POCT URINALYSIS DIPSTICK
Bilirubin, UA: NEGATIVE
Blood, UA: NEGATIVE
Ketones, UA: NEGATIVE
Nitrite, UA: NEGATIVE
pH, UA: 6.5

## 2012-03-12 NOTE — Patient Instructions (Addendum)
Continue same meds and return in 6 months.  Get Zostavax vaccine at pharmacy. Please return 3 hemoccult cards

## 2012-03-12 NOTE — Progress Notes (Signed)
Subjective:    Patient ID: Casey [name], female    DOB: 12/26/1934, 77 y.o.   MRN: [id number]  HPI 77 year old black female with history of cardiomyopathy with low ejection fraction, pacemaker, chronic Coumadin therapy, hypertension, hyperlipidemia, type 2 diabetes mellitus in today for health maintenance exam and evaluation of medical problems. Is under the care of Dr. Wynonia Lawman and Dr. Caryl Comes, cardiologists.  She has been a patient in this practice since 1991. Had a hysterectomy with BSO February 1990. Took estrogen replacement for a number of years. Had 2 cesarean sections.  History of peptic ulcer disease when she was 77 years old.  Does not smoke or consume alcohol.  Last colonoscopy on file 1999.  History of spinal stenosis treated conservatively by Dr. Christy Sartorius may be years ago.  Right cataract extraction July 2009. History of glaucoma. Hospitalized for lumbar spinal stenosis March 2000. History of ganglion cyst 2 cm in diameter aspirated from right leg 1997 by orthopedist, Dr. Larose Kells.  Insertion of implantable defibrillator 2005. Had new defibrillator placed July 2009.  Mammogram April 2012. Pneumovax March 2011. Tetanus vaccine March 2011. Gets annual influenza immunization.  In December 2010 I incised and drained an infected epidermoid cyst.  She is on chronic Coumadin therapy and statin therapy for hyperlipidemia.  She took Lexapro for a while during the time that her mother was ill but has been able to stop that medication.  She is married. 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 Husband as a Art gallery manager. A son and a daughter.  Family history: Mother with history of stroke, MI, diabetes and hypertension. Father died with cancer of the esophagus. One brother reportedly died of questionable heart issues.    Review of Systems  Constitutional: Negative.   HENT: Negative.   Eyes: Negative.   Respiratory: Negative.   Cardiovascular:       Denies shortness of  breath or chest pain  Genitourinary: Negative.   Musculoskeletal: Positive for arthralgias.  Neurological: Negative.   Hematological: Negative.   Psychiatric/Behavioral: Negative.        Objective:   Physical Exam  Vitals reviewed. Constitutional: She is oriented to person, place, and time. She appears well-developed and well-nourished. No distress.  HENT:  Head: Normocephalic and atraumatic.  Right Ear: External ear normal.  Left Ear: External ear normal.  Mouth/Throat: Oropharynx is clear and moist. No oropharyngeal exudate.  Eyes: Conjunctivae normal and EOM are normal. Pupils are equal, round, and reactive to light. Right eye exhibits no discharge. Left eye exhibits no discharge. No scleral icterus.  Neck: Neck supple. No JVD present. No thyromegaly present.  Cardiovascular: Normal rate and regular rhythm.   Murmur heard.      3/6 holosystolic ejection murmur unchanged  Pulmonary/Chest: Effort normal and breath sounds normal. She has no wheezes. She has no rales.       Breasts normal female without masses  Abdominal: Soft. Bowel sounds are normal. She exhibits no distension and no mass. There is no tenderness. There is no rebound and no guarding.  Genitourinary:       Deferred  Musculoskeletal: Normal range of motion. She exhibits no edema.  Lymphadenopathy:    She has no cervical adenopathy.  Neurological: She is alert and oriented to person, place, and time. She has normal reflexes. No cranial nerve deficit. Coordination normal.  Skin: Skin is warm and dry. No rash noted. She is not diaphoretic.  Psychiatric: She has a normal mood and affect. Her behavior is normal.  Judgment and thought content normal.          Assessment & Plan:  Type 2 diabetes mellitus  Hyperlipidemia  Vitamin D deficiency  Essential hypertension  Glaucoma  Anxiety  History of lumbar spinal stenosis  History of ventricular septal defect  History of implantable cardiac  defibrillator  Plan: Continue same medications and return in 6 months for office visit lipid panel liver functions and blood pressure check. Continues to be followed by Dr. Wynonia Lawman, cardiologist Subjective:   Patient presents for Medicare Annual/Subsequent preventive examination  Review Past Medical/Family/Social: See above in Epic   Risk Factors  Current exercise habits: Walks sometimes Dietary issues discussed: Low-fat low carbohydrate diet discussed  Cardiac risk factors: Hypertension, hyperlipidemia, type 2 diabetes mellitus, congestive heart failure with pacemaker  Depression Screen  (Note: if answer to either of the following is "Yes", a more complete depression screening is indicated)   Over the past two weeks, have you felt down, depressed or hopeless? No  Over the past two weeks, have you felt little interest or pleasure in doing things? No Have you lost interest or pleasure in daily life? No Do you often feel hopeless? No Do you cry easily over simple problems? No   Activities of Daily Living  In your present state of health, do you have any difficulty performing the following activities?:   Driving? No  Managing money? No  Feeding yourself? No  Getting from bed to chair? No  Climbing a flight of stairs? No  Preparing food and eating?: No  Bathing or showering? No  Getting dressed: No  Getting to the toilet? No  Using the toilet:No  Moving around from place to place: No  In the past year have you fallen or had a near fall?:No  Are you sexually active? No  Do you have more than one partner? No   Hearing Difficulties: No  Do you often ask people to speak up or repeat themselves? yes  Do you experience ringing or noises in your ears? No  Do you have difficulty understanding soft or whispered voices? No  Do you feel that you have a problem with memory? No Do you often misplace items? No    Home Safety:  Do you have a smoke alarm at your residence? Yes Do you  have grab bars in the bathroom?NO Do you have throw rugs in your house?yes   Cognitive Testing  Alert? Yes Normal Appearance?Yes  Oriented to person? Yes Place? Yes  Time? Yes  Recall of three objects? Yes  Can perform simple calculations? Yes  Displays appropriate judgment?Yes  Can read the correct time from a watch face?Yes   List the Names of Other Physician/Practitioners you currently use:  See referral list for the physicians patient is currently seeing. Dr. Wynonia Lawman and Dr. Caryl Comes- Cardiology    Review of Systems: See Epic   Objective:     General appearance: Appears stated age Head: Normocephalic, without obvious abnormality, atraumatic  Eyes: conj clear, EOMi PEERLA  Ears: normal TM's and external ear canals both ears  Nose: Nares normal. Septum midline. Mucosa normal. No drainage or sinus tenderness.  Throat: lips, mucosa, and tongue normal; teeth and gums normal  Neck: no adenopathy, no carotid bruit, no JVD, supple, symmetrical, trachea midline and thyroid not enlarged, symmetric, no tenderness/mass/nodules  No CVA tenderness.  Lungs: clear to auscultation bilaterally  Breasts: normal appearance, no masses or tenderness. Heart: regular rate and rhythm,  Murmur unchanged Abdomen: soft, non-tender; bowel  sounds normal; no masses, no organomegaly  Musculoskeletal: ROM normal in all joints, no crepitus, no deformity, Normal muscle strengthen. Back  is symmetric, no curvature. Skin: Skin color, texture, turgor normal. No rashes or lesions  Lymph nodes: Cervical, supraclavicular, and axillary nodes normal.  Neurologic: CN 2 -12 Normal, Normal symmetric reflexes. Normal coordination and gait  Psych: Alert & Oriented x 3, Mood appear stable.    Assessment:    Annual wellness medicare exam   Plan:    During the course of the visit the patient was educated and counseled about appropriate screening and preventive services including:   See Epic     Patient  Instructions (the written plan) was given to the patient.  Medicare Attestation  I have personally reviewed:  The patient's medical and social history  Their use of alcohol, tobacco or illicit drugs  Their current medications and supplements  The patient's functional ability including ADLs,fall risks, home safety risks, cognitive, and hearing and visual impairment  Diet and physical activities  Evidence for depression or mood disorders  The patient's weight, height, BMI, and visual acuity have been recorded in the chart. I have made referrals, counseling, and provided education to the patient based on review of the above and I have provided the patient with a written personalized care plan for preventive services.

## 2012-03-13 ENCOUNTER — Encounter: Payer: Self-pay | Admitting: *Deleted

## 2012-03-13 DIAGNOSIS — I501 Left ventricular failure: Secondary | ICD-10-CM | POA: Diagnosis not present

## 2012-03-13 DIAGNOSIS — Q210 Ventricular septal defect: Secondary | ICD-10-CM | POA: Diagnosis not present

## 2012-03-13 DIAGNOSIS — Z7901 Long term (current) use of anticoagulants: Secondary | ICD-10-CM | POA: Diagnosis not present

## 2012-03-13 DIAGNOSIS — Z95810 Presence of automatic (implantable) cardiac defibrillator: Secondary | ICD-10-CM | POA: Diagnosis not present

## 2012-03-13 DIAGNOSIS — I509 Heart failure, unspecified: Secondary | ICD-10-CM | POA: Diagnosis not present

## 2012-03-13 DIAGNOSIS — I428 Other cardiomyopathies: Secondary | ICD-10-CM | POA: Diagnosis not present

## 2012-03-19 ENCOUNTER — Encounter: Payer: Self-pay | Admitting: Internal Medicine

## 2012-03-19 ENCOUNTER — Ambulatory Visit (INDEPENDENT_AMBULATORY_CARE_PROVIDER_SITE_OTHER): Payer: Medicare Other | Admitting: Internal Medicine

## 2012-03-19 VITALS — BP 133/74 | HR 80 | Ht 62.0 in | Wt 166.1 lb

## 2012-03-19 DIAGNOSIS — I428 Other cardiomyopathies: Secondary | ICD-10-CM

## 2012-03-19 DIAGNOSIS — I4891 Unspecified atrial fibrillation: Secondary | ICD-10-CM | POA: Diagnosis not present

## 2012-03-19 DIAGNOSIS — I472 Ventricular tachycardia, unspecified: Secondary | ICD-10-CM

## 2012-03-19 DIAGNOSIS — Z95810 Presence of automatic (implantable) cardiac defibrillator: Secondary | ICD-10-CM

## 2012-03-19 DIAGNOSIS — I509 Heart failure, unspecified: Secondary | ICD-10-CM

## 2012-03-19 LAB — ICD DEVICE OBSERVATION
AL AMPLITUDE: 2.8 mv
AL THRESHOLD: 0.5 V
ATRIAL PACING ICD: 2.4 pct
RV LEAD THRESHOLD: 0.5 V
TZAT-0001ATACH: 2
TZAT-0001ATACH: 3
TZAT-0001FASTVT: 1
TZAT-0001SLOWVT: 2
TZAT-0004FASTVT: 8
TZAT-0004SLOWVT: 8
TZAT-0004SLOWVT: 8
TZAT-0005FASTVT: 88 pct
TZAT-0012ATACH: 150 ms
TZAT-0012ATACH: 150 ms
TZAT-0012ATACH: 150 ms
TZAT-0012SLOWVT: 200 ms
TZAT-0018ATACH: NEGATIVE
TZAT-0019ATACH: 6 V
TZAT-0019SLOWVT: 8 V
TZAT-0020ATACH: 1.5 ms
TZAT-0020ATACH: 1.5 ms
TZAT-0020ATACH: 1.5 ms
TZAT-0020SLOWVT: 1.5 ms
TZAT-0020SLOWVT: 1.5 ms
TZON-0003ATACH: 350 ms
TZON-0003SLOWVT: 380 ms
TZON-0003VSLOWVT: 430 ms
TZON-0004VSLOWVT: 20
TZST-0001ATACH: 5
TZST-0001FASTVT: 2
TZST-0001FASTVT: 5
TZST-0001SLOWVT: 3
TZST-0001SLOWVT: 5
TZST-0002ATACH: NEGATIVE
TZST-0002ATACH: NEGATIVE
TZST-0003FASTVT: 35 J
TZST-0003FASTVT: 35 J
TZST-0003SLOWVT: 25 J

## 2012-03-19 NOTE — Assessment & Plan Note (Addendum)
The patient has had intercurrent episodes of ventricular tachycardia that have been treated. She has also had episodes of ventricular tachycardia that has not been treated because of toggling at her the ventricular tachycardia detection rate. We reprogrammed her VT detection rate from 150-135 with a detection interval of 100 beats, most consistent with the MADIT-RIT protocol.

## 2012-03-19 NOTE — Assessment & Plan Note (Signed)
Sporadic atrial fibrillation of short duration

## 2012-03-19 NOTE — Assessment & Plan Note (Signed)
The patient's device was interrogated.  The information was reviewed. No changes were made in the programming.    

## 2012-03-19 NOTE — Patient Instructions (Addendum)
Your physician wants you to follow-up in: 1 year with Dr Klein.  You will receive a reminder letter in the mail two months in advance. If you don't receive a letter, please call our office to schedule the follow-up appointment.  

## 2012-03-19 NOTE — Assessment & Plan Note (Signed)
Currently on therapy directed by Dr. Wynonia Lawman

## 2012-03-19 NOTE — Progress Notes (Signed)
Patient Care Team: Elby Showers, MD as PCP - General (Internal Medicine)   HPI  Casey [name] is a 77 y.o. female  seen in followup for CHF in the setting of Nonischemic cardiomyopathy for which she is s/p Medtronic CRT implant.  She underwent ICD implantation years ago with by the upgrade about 2005. In 2007 she had failure of her defibrillator lead resulting in inappropriate shocks and requiring lead revision.  She has seen Dr. Wynonia Lawman ecently. She has been relatively stable from ACE congestive heart failure point    Past Medical History  Diagnosis Date  . Cardiomyopathy   . CHF (congestive heart failure)   . Hypertension   . Biventricular ICD (implantable cardiac defibrillator) in place     medtronic     No past surgical history on file.  Current Outpatient Prescriptions  Medication Sig Dispense Refill  . cholecalciferol (VITAMIN D) 1000 UNITS tablet Take 1,000 Units by mouth daily.        Marland Kitchen escitalopram (LEXAPRO) 5 MG tablet Take 2.5 mg by mouth daily.        . furosemide (LASIX) 40 MG tablet Take 40 mg by mouth daily.        Marland Kitchen lisinopril (PRINIVIL,ZESTRIL) 20 MG tablet Take 20 mg by mouth 2 (two) times daily.        . metoprolol (TOPROL-XL) 50 MG 24 hr tablet Take 25 mg by mouth 2 (two) times daily.        . Multiple Vitamins-Calcium (ONE-A-DAY WOMENS FORMULA) TABS Take 1 tablet by mouth daily.        . potassium chloride SA (K-DUR,KLOR-CON) 20 MEQ tablet Take 20 mEq by mouth daily.        . simvastatin (ZOCOR) 80 MG tablet TAKE ONE TABLET BY MOUTH EVERY DAY IN THE EVENING  30 tablet  11  . warfarin (COUMADIN) 4 MG tablet Take 4 mg by mouth as directed.          Allergies  Allergen Reactions  . Codeine     Review of Systems negative except from HPI and PMH  Physical Exam BP 133/74  Pulse 80  Ht 5\' 2"  (1.575 m)  Wt 166 lb 1.9 oz (75.352 kg)  BMI 30.38 kg/m2 Well developed and nourished in no acute distress HENT normal Neck supple with  JVP-flat Carotids brisk and full without bruits Clear Regular rate and rhythm, no murmurs or gallops Abd-soft with active BS   No Clubbing cyanosis edema Skin-warm and dry A & Oriented  Grossly normal sensory and motor function Device pocket well healed; without hematoma or erythema  Echocardiogram demonstrates P. synchronous pacing with frequent PVCs; sclerae are seen on this which comprise about 25% Assessment and  Plan

## 2012-03-27 DIAGNOSIS — H4011X0 Primary open-angle glaucoma, stage unspecified: Secondary | ICD-10-CM | POA: Diagnosis not present

## 2012-03-27 DIAGNOSIS — Z961 Presence of intraocular lens: Secondary | ICD-10-CM | POA: Diagnosis not present

## 2012-04-10 DIAGNOSIS — I501 Left ventricular failure: Secondary | ICD-10-CM | POA: Diagnosis not present

## 2012-04-10 DIAGNOSIS — Z95810 Presence of automatic (implantable) cardiac defibrillator: Secondary | ICD-10-CM | POA: Diagnosis not present

## 2012-04-10 DIAGNOSIS — I428 Other cardiomyopathies: Secondary | ICD-10-CM | POA: Diagnosis not present

## 2012-04-10 DIAGNOSIS — Z7901 Long term (current) use of anticoagulants: Secondary | ICD-10-CM | POA: Diagnosis not present

## 2012-04-10 DIAGNOSIS — I509 Heart failure, unspecified: Secondary | ICD-10-CM | POA: Diagnosis not present

## 2012-04-10 DIAGNOSIS — Q210 Ventricular septal defect: Secondary | ICD-10-CM | POA: Diagnosis not present

## 2012-04-24 DIAGNOSIS — I509 Heart failure, unspecified: Secondary | ICD-10-CM | POA: Diagnosis not present

## 2012-04-24 DIAGNOSIS — Z7901 Long term (current) use of anticoagulants: Secondary | ICD-10-CM | POA: Diagnosis not present

## 2012-04-24 DIAGNOSIS — Q210 Ventricular septal defect: Secondary | ICD-10-CM | POA: Diagnosis not present

## 2012-04-24 DIAGNOSIS — I501 Left ventricular failure: Secondary | ICD-10-CM | POA: Diagnosis not present

## 2012-04-24 DIAGNOSIS — I428 Other cardiomyopathies: Secondary | ICD-10-CM | POA: Diagnosis not present

## 2012-04-24 DIAGNOSIS — Z95810 Presence of automatic (implantable) cardiac defibrillator: Secondary | ICD-10-CM | POA: Diagnosis not present

## 2012-05-08 DIAGNOSIS — Z7901 Long term (current) use of anticoagulants: Secondary | ICD-10-CM | POA: Diagnosis not present

## 2012-05-08 DIAGNOSIS — Q210 Ventricular septal defect: Secondary | ICD-10-CM | POA: Diagnosis not present

## 2012-05-08 DIAGNOSIS — I509 Heart failure, unspecified: Secondary | ICD-10-CM | POA: Diagnosis not present

## 2012-05-08 DIAGNOSIS — I428 Other cardiomyopathies: Secondary | ICD-10-CM | POA: Diagnosis not present

## 2012-05-08 DIAGNOSIS — Z95810 Presence of automatic (implantable) cardiac defibrillator: Secondary | ICD-10-CM | POA: Diagnosis not present

## 2012-05-08 DIAGNOSIS — I501 Left ventricular failure: Secondary | ICD-10-CM | POA: Diagnosis not present

## 2012-05-18 ENCOUNTER — Emergency Department (HOSPITAL_COMMUNITY)
Admission: EM | Admit: 2012-05-18 | Discharge: 2012-05-18 | Disposition: A | Discharge status: Home / Self Care | Payer: Medicare Other | Attending: Emergency Medicine | Admitting: Emergency Medicine

## 2012-05-18 ENCOUNTER — Emergency Department (HOSPITAL_COMMUNITY): Payer: Medicare Other

## 2012-05-18 DIAGNOSIS — Z79899 Other long term (current) drug therapy: Secondary | ICD-10-CM | POA: Diagnosis not present

## 2012-05-18 DIAGNOSIS — I10 Essential (primary) hypertension: Secondary | ICD-10-CM | POA: Diagnosis not present

## 2012-05-18 DIAGNOSIS — I428 Other cardiomyopathies: Secondary | ICD-10-CM | POA: Insufficient documentation

## 2012-05-18 DIAGNOSIS — IMO0001 Reserved for inherently not codable concepts without codable children: Secondary | ICD-10-CM

## 2012-05-18 DIAGNOSIS — Z95810 Presence of automatic (implantable) cardiac defibrillator: Secondary | ICD-10-CM | POA: Diagnosis not present

## 2012-05-18 DIAGNOSIS — Z7901 Long term (current) use of anticoagulants: Secondary | ICD-10-CM | POA: Insufficient documentation

## 2012-05-18 DIAGNOSIS — R404 Transient alteration of awareness: Secondary | ICD-10-CM | POA: Diagnosis not present

## 2012-05-18 DIAGNOSIS — T82897A Other specified complication of cardiac prosthetic devices, implants and grafts, initial encounter: Secondary | ICD-10-CM | POA: Diagnosis not present

## 2012-05-18 DIAGNOSIS — I472 Ventricular tachycardia: Secondary | ICD-10-CM

## 2012-05-18 DIAGNOSIS — I509 Heart failure, unspecified: Secondary | ICD-10-CM | POA: Insufficient documentation

## 2012-05-18 DIAGNOSIS — R42 Dizziness and giddiness: Secondary | ICD-10-CM | POA: Insufficient documentation

## 2012-05-18 DIAGNOSIS — R5381 Other malaise: Secondary | ICD-10-CM | POA: Diagnosis not present

## 2012-05-18 DIAGNOSIS — Z45018 Encounter for adjustment and management of other part of cardiac pacemaker: Secondary | ICD-10-CM | POA: Diagnosis not present

## 2012-05-18 DIAGNOSIS — I499 Cardiac arrhythmia, unspecified: Secondary | ICD-10-CM | POA: Diagnosis not present

## 2012-05-18 DIAGNOSIS — Y849 Medical procedure, unspecified as the cause of abnormal reaction of the patient, or of later complication, without mention of misadventure at the time of the procedure: Secondary | ICD-10-CM | POA: Insufficient documentation

## 2012-05-18 LAB — CBC WITH DIFFERENTIAL/PLATELET
Basophils Absolute: 0 10*3/uL (ref 0.0–0.1)
Basophils Relative: 1 % (ref 0–1)
Eosinophils Absolute: 0.2 10*3/uL (ref 0.0–0.7)
Eosinophils Relative: 4 % (ref 0–5)
HCT: 44.3 % (ref 36.0–46.0)
MCH: 33.6 pg (ref 26.0–34.0)
MCHC: 34.8 g/dL (ref 30.0–36.0)
MCV: 96.5 fL (ref 78.0–100.0)
Monocytes Absolute: 0.4 10*3/uL (ref 0.1–1.0)
RDW: 12.3 % (ref 11.5–15.5)

## 2012-05-18 LAB — POCT I-STAT TROPONIN I: Troponin i, poc: 0.03 ng/mL (ref 0.00–0.08)

## 2012-05-18 LAB — COMPREHENSIVE METABOLIC PANEL
AST: 32 U/L (ref 0–37)
Albumin: 3.6 g/dL (ref 3.5–5.2)
Calcium: 10 mg/dL (ref 8.4–10.5)
Creatinine, Ser: 0.96 mg/dL (ref 0.50–1.10)
GFR calc non Af Amer: 56 mL/min — ABNORMAL LOW (ref >90)

## 2012-05-18 LAB — MAGNESIUM: Magnesium: 2.2 mg/dL (ref 1.5–2.5)

## 2012-05-18 LAB — PROTIME-INR: Prothrombin Time: 23.3 seconds — ABNORMAL HIGH (ref 11.6–15.2)

## 2012-05-18 NOTE — ED Notes (Signed)
Per EMS pt was feeling dizzy and then defibrilator fired and shocked pt. Pt vital signs WNL. Paced rhythm occasional uni-focal PVC. BP 136/79

## 2012-05-18 NOTE — ED Notes (Signed)
Spoke to [employer] delivered shock 518pm (does not account for DLS time) atrium 92 bpm ventricle 222bpm. Battery and lead good. Atrium sensed. Lead in left and right ventricles Paced 100% of them time. Jacqlyn Krauss [phone] option 4

## 2012-05-18 NOTE — Consult Note (Signed)
Cardiology Consult Note Elby Showers, MD No ref. provider found  Reason for consult: ICD discharge  History of Present Illness (and review of medical records): Casey [name] is a 77 y.o. female who presents for evaluation of after her ICD fired at home today.  She is followed in cardiology by Drs. Wynonia Lawman and [place].  She was recently seen in followup for CHF in the setting of Nonischemic cardiomyopathy for which she is s/p Medtronic CRT implant.  She underwent ICD implantation years ago with by the upgrade about 2005. In 2007 she had failure of her defibrillator lead resulting in inappropriate shocks and requiring lead revision.    She was otherwise in her usual state of health in the kitchen this evening when she felt lightheaded and had to sit down.  She then felt her ICD fire.  She had no fall or trauma, no shortness of breath or chest pain.  He had no other symptoms in past week.  She has been compliant with medications.  She recently had ICD interrogated with normal function.  She is seen in the ED for evaluation.  She remains without symptoms.  She is also hemodynamically stable.  Review of Systems A comprehensive review of systems was negative other than stated in HPI. Patient Active Problem List   Diagnosis Date Noted  . Gout 07/18/2011  . Diabetes mellitus 03/06/2011  . Glaucoma 03/06/2011  . Anxiety 03/06/2011  . Spinal stenosis 03/06/2011  . Ventricular septal defect 03/06/2011  . Vitamin D deficiency 03/06/2011  . PVC's (premature ventricular contractions) 02/14/2011  . Nonischemic cardiomyopathy 02/14/2011  . Atrial fibrillation 02/14/2011  . Leg pain-intermittent exertionally related 02/14/2011  . ICD-Medtronic   . Hyperlipidemia 02/09/2011  . VENTRICULAR TACHYCARDIA TREATED WITH ATP 02/09/2009  . HYPERTENSION 02/08/2009  . CHF 02/08/2009   Past Medical History  Diagnosis Date  . Cardiomyopathy   . CHF (congestive heart failure)   . Hypertension   .  Biventricular ICD (implantable cardiac defibrillator) in place     medtronic     No past surgical history on file.   (Not in a hospital admission) Allergies  Allergen Reactions  . Codeine     Unknown    History  Substance Use Topics  . Smoking status: Never Smoker   . Smokeless tobacco: Never Used  . Alcohol Use: No    Family History  Problem Relation Age of Onset  . Heart failure Mother   . Cancer Father      Objective: Patient Vitals for the past 8 hrs:  BP Temp Temp src Resp  05/18/12 1650 148/69 mmHg 97.9 F (36.6 C) Oral 16    General Appearance:    Alert, cooperative, no distress, appears stated age  Head:    Normocephalic, without obvious abnormality, atraumatic  Eyes:     PERRL, EOMI, anicteric sclerae  Neck:   Supple, no carotid bruit or JVD  Lungs:     Clear to auscultation bilaterally, respirations unlabored  Heart:    Regular rate and rhythm, S1 and S2 normal, no murmurs, ICD upper left chest  Abdomen:     Soft, non-tender, normoactive bowel sounds  Extremities:   Extremities normal, atraumatic, no cyanosis or edema  Pulses:   2+ and symmetric all extremities  Skin:   no rashes or lesions,  Neurologic:   No focal deficits. AAO x3   Results for orders placed during the hospital encounter of 05/18/12 (from the past 48 hour(s))  CBC WITH DIFFERENTIAL  Status: Abnormal   Collection Time   05/18/12  5:19 PM      Component Value Range Comment   WBC 5.3  4.0 - 10.5 K/uL    RBC 4.59  3.87 - 5.11 MIL/uL    Hemoglobin 15.4 (*) 12.0 - 15.0 g/dL    HCT 44.3  36.0 - 46.0 %    MCV 96.5  78.0 - 100.0 fL    MCH 33.6  26.0 - 34.0 pg    MCHC 34.8  30.0 - 36.0 g/dL    RDW 12.3  11.5 - 15.5 %    Platelets 102 (*) 150 - 400 K/uL PLATELET COUNT CONFIRMED BY SMEAR   Neutrophils Relative 56  43 - 77 %    Neutro Abs 3.0  1.7 - 7.7 K/uL    Lymphocytes Relative 32  12 - 46 %    Lymphs Abs 1.7  0.7 - 4.0 K/uL    Monocytes Relative 8  3 - 12 %    Monocytes Absolute 0.4   0.1 - 1.0 K/uL    Eosinophils Relative 4  0 - 5 %    Eosinophils Absolute 0.2  0.0 - 0.7 K/uL    Basophils Relative 1  0 - 1 %    Basophils Absolute 0.0  0.0 - 0.1 K/uL   COMPREHENSIVE METABOLIC PANEL     Status: Abnormal   Collection Time   05/18/12  5:19 PM      Component Value Range Comment   Sodium 140  135 - 145 mEq/L    Potassium 3.8  3.5 - 5.1 mEq/L    Chloride 106  96 - 112 mEq/L    CO2 23  19 - 32 mEq/L    Glucose, Bld 130 (*) 70 - 99 mg/dL    BUN 20  6 - 23 mg/dL    Creatinine, Ser 0.96  0.50 - 1.10 mg/dL    Calcium 10.0  8.4 - 10.5 mg/dL    Total Protein 6.9  6.0 - 8.3 g/dL    Albumin 3.6  3.5 - 5.2 g/dL    AST 32  0 - 37 U/L    ALT 18  0 - 35 U/L    Alkaline Phosphatase 117  39 - 117 U/L    Total Bilirubin 0.9  0.3 - 1.2 mg/dL    GFR calc non Af Amer 56 (*) >90 mL/min    GFR calc Af Amer 65 (*) >90 mL/min   MAGNESIUM     Status: Normal   Collection Time   05/18/12  5:19 PM      Component Value Range Comment   Magnesium 2.2  1.5 - 2.5 mg/dL   POCT I-STAT TROPONIN I     Status: Normal   Collection Time   05/18/12  5:41 PM      Component Value Range Comment   Troponin i, poc 0.03  0.00 - 0.08 ng/mL    Comment 3             INR pending at time of this note.  Dg Chest Portable 1 View  05/18/2012  *RADIOLOGY REPORT*  Clinical Data: Fired defibrillator, dizziness  PORTABLE CHEST - 1 VIEW  Comparison: 12/17/2007  Findings: Cardiomegaly again noted.  Four leads cardiac pacemaker is stable in position.  No acute infiltrate or pulmonary edema. Significant dextroscoliosis thoracic spine again noted.  IMPRESSION: No active disease.  Cardiomegaly again noted.  Stable four leads cardiac pacemaker position.   Original Report Authenticated By:  Lahoma Crocker, M.D.     ECG:  Demand pacemaker, sinus rhythm HR 93 similar to prior, no significant ectopy  Impression: 43F hx of nonischemic CMP s/p BiV-ICD presents s/p ICD discharge.  Medtronic device reveals normally function device with  one shock for FVT with 25J.  She is now asymptomatic and hemodynamically stable.  She is euvolemic, warm and well perfused.  She has no electrolyte abnormalities and no acute signs of infection.  Patient tolerated ambulation in ED without symptoms and family present at bedside.  Recommendations: --Given patient currently asymptomatic in sinus rhythm with normally functioning ICD, would suggest no acute inpatient evaluation needed at this time. --Patient will keep follow up appointment schedule on Tuesday Dec 10th with Dr. Wynonia Lawman. --Instructions given on signs and symptoms for which to return to ED and/or call EMS.  Patient and family voiced understanding. --Continue current medical regimen including: Current Outpatient Prescriptions  Medication Sig Dispense Refill  . furosemide (LASIX) 40 MG tablet Take 40 mg by mouth daily.        Marland Kitchen lisinopril (PRINIVIL,ZESTRIL) 20 MG tablet Take 20 mg by mouth 2 (two) times daily.        . metoprolol (LOPRESSOR) 50 MG tablet Take 50 mg by mouth 2 (two) times daily.      . potassium chloride SA (K-DUR,KLOR-CON) 20 MEQ tablet Take 20 mEq by mouth daily.        . simvastatin (ZOCOR) 80 MG tablet TAKE ONE TABLET BY MOUTH EVERY DAY IN THE EVENING  30 tablet  11  . warfarin (COUMADIN) 4 MG tablet Take 4 mg by mouth daily.

## 2012-05-18 NOTE — ED Notes (Signed)
Vitals after walking.

## 2012-05-18 NOTE — ED Provider Notes (Signed)
History     CSN: [id number]  Arrival date & time 05/18/12  1639   First MD Initiated Contact with Patient 05/18/12 1648      Chief Complaint  Patient presents with  . defib fired     (Consider location/radiation/quality/duration/timing/severity/associated sxs/prior treatment) HPI Casey [name] is a 77 y.o. female who presents with complaint of possible defibrillator firing. 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 Caryl Comes. Pt states she was fixing food today when started feeling dizzy. States she sat down, closed her eyes, when she felt a sharp "boom" in her chest. States felt like her defibrillator fired. States hx of defibrillator firing in 2007, felt the same. States episode of dizziness lasted about 3-4 mintues. States called ems. States feeling well now, symptom free, at baseline. Pt states prior to this episode felt well. Recently saw Dr. Caryl Comes, and was told everything was OK. Past Medical History  Diagnosis Date  . Cardiomyopathy   . CHF (congestive heart failure)   . Hypertension   . Biventricular ICD (implantable cardiac defibrillator) in place     medtronic     No past surgical history on file.  Family History  Problem Relation Age of Onset  . Heart failure Mother   . Cancer Father     History  Substance Use Topics  . Smoking status: Never Smoker   . Smokeless tobacco: Never Used  . Alcohol Use: No    OB History    Grav Para Term Preterm Abortions TAB SAB Ect Mult Living                  Review of Systems  Respiratory: Negative for chest tightness and shortness of breath.   Cardiovascular: Negative for chest pain and leg swelling.  Neurological: Positive for dizziness, weakness and light-headedness. Negative for syncope.  All other systems reviewed and are negative.    Allergies  Codeine  Home Medications   Current Outpatient Rx  Name  Route  Sig  Dispense  Refill  . ALLOPURINOL 100 MG PO TABS   Oral   Take  100 mg by mouth daily.         Marland Kitchen VITAMIN D 1000 UNITS PO TABS   Oral   Take 800 Units by mouth daily.          . FUROSEMIDE 40 MG PO TABS   Oral   Take 40 mg by mouth daily.           Marland Kitchen LISINOPRIL 20 MG PO TABS   Oral   Take 20 mg by mouth 2 (two) times daily.           Marland Kitchen METOPROLOL TARTRATE 50 MG PO TABS   Oral   Take 50 mg by mouth 2 (two) times daily.         Wynn [place]lin WOMENS FORMULA PO TABS   Oral   Take 1 tablet by mouth daily.           Marland Kitchen POTASSIUM CHLORIDE CRYS ER 20 MEQ PO TBCR   Oral   Take 20 mEq by mouth daily.           Marland Kitchen SIMVASTATIN 80 MG PO TABS      TAKE ONE TABLET BY MOUTH EVERY DAY IN THE EVENING   30 tablet   11   . WARFARIN SODIUM 4 MG PO TABS   Oral   Take 4 mg by mouth daily.  There were no vitals taken for this visit.  Physical Exam  Nursing note and vitals reviewed. Constitutional: She is oriented to person, place, and time. She appears well-developed and well-nourished. No distress.  HENT:  Head: Normocephalic and atraumatic.  Eyes: Conjunctivae normal are normal. Pupils are equal, round, and reactive to light.  Neck: Neck supple.  Cardiovascular: Normal rate, regular rhythm and normal heart sounds.   No murmur heard. Pulmonary/Chest: Effort normal and breath sounds normal. No respiratory distress. She has no wheezes. She has no rales.  Abdominal: Soft. Bowel sounds are normal. She exhibits no distension. There is no tenderness. There is no rebound.  Musculoskeletal: She exhibits no edema.  Neurological: She is alert and oriented to person, place, and time.  Skin: Skin is warm and dry.  Psychiatric: She has a normal mood and affect.    ED Course  Procedures (including critical care time)   ECG:   Date: 05/19/2012  Rate: 93  Rhythm: ventricularly paced No changes noted compared to prior     Pt with possible fired ICD, will get it interegated, labs ordered, ecg. Will monitor. Pt feeling well,  hemodymamically stable.   Results for orders placed during the hospital encounter of 05/18/12  CBC WITH DIFFERENTIAL      Component Value Range   WBC 5.3  4.0 - 10.5 K/uL   RBC 4.59  3.87 - 5.11 MIL/uL   Hemoglobin 15.4 (*) 12.0 - 15.0 g/dL   HCT 44.3  36.0 - 46.0 %   MCV 96.5  78.0 - 100.0 fL   MCH 33.6  26.0 - 34.0 pg   MCHC 34.8  30.0 - 36.0 g/dL   RDW 12.3  11.5 - 15.5 %   Platelets 102 (*) 150 - 400 K/uL   Neutrophils Relative 56  43 - 77 %   Neutro Abs 3.0  1.7 - 7.7 K/uL   Lymphocytes Relative 32  12 - 46 %   Lymphs Abs 1.7  0.7 - 4.0 K/uL   Monocytes Relative 8  3 - 12 %   Monocytes Absolute 0.4  0.1 - 1.0 K/uL   Eosinophils Relative 4  0 - 5 %   Eosinophils Absolute 0.2  0.0 - 0.7 K/uL   Basophils Relative 1  0 - 1 %   Basophils Absolute 0.0  0.0 - 0.1 K/uL  COMPREHENSIVE METABOLIC PANEL      Component Value Range   Sodium 140  135 - 145 mEq/L   Potassium 3.8  3.5 - 5.1 mEq/L   Chloride 106  96 - 112 mEq/L   CO2 23  19 - 32 mEq/L   Glucose, Bld 130 (*) 70 - 99 mg/dL   BUN 20  6 - 23 mg/dL   Creatinine, Ser 0.96  0.50 - 1.10 mg/dL   Calcium 10.0  8.4 - 10.5 mg/dL   Total Protein 6.9  6.0 - 8.3 g/dL   Albumin 3.6  3.5 - 5.2 g/dL   AST 32  0 - 37 U/L   ALT 18  0 - 35 U/L   Alkaline Phosphatase 117  39 - 117 U/L   Total Bilirubin 0.9  0.3 - 1.2 mg/dL   GFR calc non Af Amer 56 (*) >90 mL/min   GFR calc Af Amer 65 (*) >90 mL/min  MAGNESIUM      Component Value Range   Magnesium 2.2  1.5 - 2.5 mg/dL  POCT I-STAT TROPONIN I      Component Value Range  Troponin i, poc 0.03  0.00 - 0.08 ng/mL   Comment 3            Dg Chest Portable 1 View  05/18/2012  *RADIOLOGY REPORT*  Clinical Data: Fired defibrillator, dizziness  PORTABLE CHEST - 1 VIEW  Comparison: 12/17/2007  Findings: Cardiomegaly again noted.  Four leads cardiac pacemaker is stable in position.  No acute infiltrate or pulmonary edema. Significant dextroscoliosis thoracic spine again noted.  IMPRESSION: No  active disease.  Cardiomegaly again noted.  Stable four leads cardiac pacemaker position.   Original Report Authenticated By: Lahoma Crocker, M.D.     6:35 PM Medtronic report back, showing 1 treated shock for VF/VT with ventricular rate up to 240. Pt now paced HR in 90s. She is in no distress. Spoke with cardiology, will come see.      Pt was seen by Dr. Colon Flattery, cardiology. Consult note written by him. Pt was given an option of staying for observation which she declined. Per Dr. Colon Flattery, pt OK to be d/c home with close follow up. She has an apt with Dr. Wynonia Lawman in 2 days.      1. Defibrillator activation   2. Arrhythmia       MDM  Pt with fired defibrillator today, after an episode of vf/vt. Pt was evaluated here, labs all unremarkable. Interrogation showed appropriate shock, no apparent dysfuction in the devise. Pt seen by cardiology, OK to d/c home with close follow up. Pt Wants to go home. Pt d/c home in stable condition. She is able to ambulate with no problems.      Filed Vitals:   05/18/12 1850 05/18/12 1900 05/18/12 1945 05/18/12 2039  BP: 144/76 140/72 131/65 137/73  Pulse: 66 72 76 74  Temp:    98.3 F (36.8 C)  TempSrc:    Oral  Resp: 16 23 20 18   SpO2: 98% 96% 96% 99%      Renold Genta, PA 05/19/12 [place], PA 05/19/12 0121

## 2012-05-19 NOTE — ED Provider Notes (Signed)
Medical screening examination/treatment/procedure(s) were performed by non-physician practitioner and as supervising physician I was immediately available for consultation/collaboration.  Virgel Manifold, MD 05/19/12 [phone]

## 2012-05-21 ENCOUNTER — Encounter: Payer: Self-pay | Admitting: Cardiology

## 2012-05-21 ENCOUNTER — Other Ambulatory Visit: Payer: Self-pay | Admitting: Cardiology

## 2012-05-21 DIAGNOSIS — I472 Ventricular tachycardia: Secondary | ICD-10-CM | POA: Diagnosis not present

## 2012-05-21 DIAGNOSIS — I10 Essential (primary) hypertension: Secondary | ICD-10-CM

## 2012-05-21 DIAGNOSIS — Z95810 Presence of automatic (implantable) cardiac defibrillator: Secondary | ICD-10-CM | POA: Diagnosis not present

## 2012-05-21 DIAGNOSIS — I501 Left ventricular failure: Secondary | ICD-10-CM | POA: Diagnosis not present

## 2012-05-21 DIAGNOSIS — E669 Obesity, unspecified: Secondary | ICD-10-CM | POA: Diagnosis not present

## 2012-05-21 DIAGNOSIS — I428 Other cardiomyopathies: Secondary | ICD-10-CM | POA: Diagnosis not present

## 2012-05-21 DIAGNOSIS — Q210 Ventricular septal defect: Secondary | ICD-10-CM

## 2012-05-21 DIAGNOSIS — Z79899 Other long term (current) drug therapy: Secondary | ICD-10-CM | POA: Diagnosis not present

## 2012-05-21 DIAGNOSIS — E785 Hyperlipidemia, unspecified: Secondary | ICD-10-CM

## 2012-05-21 DIAGNOSIS — Z7901 Long term (current) use of anticoagulants: Secondary | ICD-10-CM | POA: Diagnosis not present

## 2012-05-21 DIAGNOSIS — I509 Heart failure, unspecified: Secondary | ICD-10-CM

## 2012-05-21 NOTE — Progress Notes (Signed)
Casey [name], Casey [name]  Date of visit:  05/21/2012 DOB:  [birth year]    Age:  77 yrs. Medical record number:  [id number]     Account number:  [id number] Primary Care Provider: [facility] General ____________________________ CURRENT DIAGNOSES  1. Ventricular tachycardia  2. Amiodarone Treatment  3. Cardiomyopathy Idiopathic  4. Congestive Heart Failure Left  5. Interventricular Septal Defect  6. Long-term (current) Use Of Other Medications  7. Obesity(BMI30-40)  8. Congestive Heart Failure  9. AICD in situ ____________________________ ALLERGIES  Candesartan Cilexetil, Nausea and vomiting  Codeine Sulf, Rash  Hydralazine HCl, Lethargy  Isosorbide Dinitrate, Lethargy ____________________________ MEDICATIONS  1. Vitamin D3 400 unit Capsule, 1 p.o. daily  2. furosemide 40 mg tablet, 1 p.o. daily  3. allopurinol 100 mg tablet, 1 p.o. daily  4. warfarin 4 mg tablet, 6mg  2 x week 4mg  other days  5. Klor-Con M20 20 mEq tablet,ER particles/crystals, 1 p.o. q.d.  6. lisinopril 20 mg tablet, BID  7. metoprolol tartrate 50 mg tablet, 1/2 tab b.i.d.  8. amiodarone 200 mg tablet, BID ____________________________ CHIEF COMPLAINTS  Recent er with palp ____________________________ HISTORY OF PRESENT ILLNESS  Patient seen for cardiac evaluation. She was in the emergency room 3 nights ago when she had a defibrillator discharge. She has been having more ventricular tachycardia recently and Dr. Caryl Comes recently reprogrammed her defibrillator settings. She has not had any angina and has not had worsening congestive heart failure. She has not had any new medications. She denies PND, orthopnea or significant edema. She has gained significant weight recently. She was evidently cleared in the emergency room and had an appropriate or spots and was discharged home. Review of her defibrillator records show that she has been having fairly frequent episodes of ventricular tachycardia. She has been under worsening  financial stress and has been anxious about this. ____________________________ PAST HISTORY  Past Medical Illnesses:  history of peptic ulcer disease, lumbar disc disease, left arm  DVT 12/07, DM-diet controlled, gout;  Cardiovascular Illnesses:  cardiomyopathy(idiopathic), CHF, insertion of biventricular defibrillator 6/05, ventricular tachycardia;  Surgical Procedures:  cesarean section, hysterectomy, pin rt femur, Medtronic AICD implant June 2005;  Cardiology Procedures-Invasive:  cardiac cath (left) May 2005, replacement of fractured defibrillator lead 11/07, Medtronic generator change July 2009;  Cardiology Procedures-Noninvasive:  echocardiogram November 2009, echocardiogram June 2011, echocardiogram June 2013;  Cardiac Cath Results:  normal coronary arteries, global hypokinesis;  LVEF of 20% documented via echocardiogram on 11/12/2011 ____________________________ CARDIO-PULMONARY TEST DATES EKG Date:  05/21/2012;   Cardiac Cath Date:  11/02/2003;  Echocardiography Date: 11/22/2011;  Chest Xray Date: 05/18/2012;   ____________________________ FAMILY HISTORY Father - age 36, died of cancer; Mother - age 17, died of age PVD; Brother 1 - age 64, shot; Sister 1 -  alive and well;  ____________________________ SOCIAL HISTORY Alcohol Use:  no alcohol use;  Smoking:  used to smoke but quit;  Diet:  regular diet;  Lifestyle:  married, 1 daughter and 1 son;  Exercise:  no regular exercise;  Occupation:  retired and Surveyor, [employer];  Residence:  lives with husband;   ____________________________ REVIEW OF SYSTEMS General:  denies recent weight change, fatique or change in exercise tolerance.Eyes:  wears eye glasses/contact lenses, decreased acuity O.D.  Respiratory:  denies dyspnea, cough, wheezing or hemoptysis.  Cardiovascular:  please review HPI  Abdominal:  denies dyspepsia, GI bleeding, constipation, or diarrhea  Genitourinary-Female:  no dysuria, urgency, frequency, UTIs  Musculoskeletal:  chronic low  back pain  Psychiatric:  situational  stress ____________________________ PHYSICAL EXAMINATION VITAL SIGNS  Blood Pressure:  120/80 Sitting, Right arm, regular cuff  , 128/84 Standing, Right arm and regular cuff   Pulse:  96/min. Weight:  170.00 lbs. Height:  62"BMI: 31  Constitutional:  pleasant African American female in no acute distress, mildly obese Skin:  warm and dry to touch, no apparent skin lesions, or masses noted. Head:  normocephalic, normal hair pattern, no masses or tenderness ENT:  ears, nose and throat reveal no gross abnormalities.  Dentition good. Neck:  supple, no masses, thyromegaly, JVD. Carotid pulses are full and equal bilaterally without bruits. Chest:  clear to auscultation and percussion, normal A-P diameter, healed ICD incision in the left pectoral area Cardiac:  S1,S2, no S3 or S4 present, regular rhythm, grade 2/6 early systolic murmur left sternal border Peripheral Pulses:  the femoral,dorsalis pedis, and posterior tibial pulses are full and equal bilaterally with no bruits auscultated. Extremities & Back:  no deformities, clubbing, cyanosis, erythema or edema observed. Normal muscle strength and tone. Neurological:  no gross motor or sensory deficits noted, affect appropriate, oriented x3. ____________________________ MOST RECENT LIPID PANEL 03/11/12  CHOL TOTL 174 mg/dl, LDL 104 calc, HDL 53 mg/dl, TRIGLYCER 85 mg/dl and CHOL/HDL 3.3 (Calc) ____________________________ IMPRESSIONS/PLAN   1. Worsening ventricular tachycardia in a patient with known idiopathic cardiomyopathy 2. Functioning implantable defibrillator 3. Nonischemic cardiomyopathy 4. Long-term anticoagulation with warfarin  Recommendations:  Discussion with Dr. Caryl Comes, extensive review of hospital records and defibrillator strips. After discussion we are gonna start her on amiodarone 200 mg daily for suppression of ventricular tachycardia. She also will need to get a followup appointment  with Dr. Caryl Comes for reprogramming of her device. I will see her in followup in 6 weeks. At that point in time we will likely reduce her amiodarone. She will need to have her protime moved up because of the interaction of the amiodarone with warfarin. In addition she is to discontinue simvastatin. ____________________________ TODAYS ORDERS  1. 12 Lead EKG: Today  2. Return Visit: 6 weeks  3. [facility] Visit: 2 weeks recheck PT/INR                       ____________________________ Cardiology Physician:  Kerry Hough MD [facility]

## 2012-05-21 NOTE — Progress Notes (Unsigned)
Patient ID: Casey [name], female   DOB: 1935-04-18, 77 y.o.   MRN: [id number]     Casey, [name]  Date of visit:  05/21/2012 DOB:  [birth year]    Age:  77 yrs. Medical record number:  [id number]     Account number:  [id number] Primary Care Provider: Em[facility] General ____________________________ CURRENT DIAGNOSES  1. Ventricular tachycardia  2. Amiodarone Treatment  3. Cardiomyopathy Idiopathic  4. Congestive Heart Failure Left  5. Interventricular Septal Defect  6. Long-term (current) Use Of Other Medications  7. Obesity(BMI30-40)  8. Congestive Heart Failure  9. AICD in situ ____________________________ ALLERGIES  Candesartan Cilexetil, Nausea and vomiting  Codeine Sulf, Rash  Hydralazine HCl, Lethargy  Isosorbide Dinitrate, Lethargy ____________________________ MEDICATIONS  1. Vitamin D3 400 unit Capsule, 1 p.o. daily  2. furosemide 40 mg tablet, 1 p.o. daily  3. allopurinol 100 mg tablet, 1 p.o. daily  4. warfarin 4 mg tablet, 6mg  2 x week 4mg  other days  5. Klor-Con M20 20 mEq tablet,ER particles/crystals, 1 p.o. q.d.  6. lisinopril 20 mg tablet, BID  7. metoprolol tartrate 50 mg tablet, 1/2 tab b.i.d.  8. amiodarone 200 mg tablet, BID ____________________________ CHIEF COMPLAINTS  Recent er with palp ____________________________ HISTORY OF PRESENT ILLNESS  Patient seen for cardiac evaluation. She was in the emergency room 3 nights ago when she had a defibrillator discharge. She has been having more ventricular tachycardia recently and Dr. Caryl Comes recently reprogrammed her defibrillator settings. She has not had any angina and has not had worsening congestive heart failure. She has not had any new medications. She denies PND, orthopnea or significant edema. She has gained significant weight recently. She was evidently cleared in the emergency room and had an appropriate or spots and was discharged home. Review of her defibrillator records show that she has been  having fairly frequent episodes of ventricular tachycardia. She has been under worsening financial stress and has been anxious about this. ____________________________ PAST HISTORY  Past Medical Illnesses:  history of peptic ulcer disease, lumbar disc disease, left arm  DVT 12/07, DM-diet controlled, gout;  Cardiovascular Illnesses:  cardiomyopathy(idiopathic), CHF, insertion of biventricular defibrillator 6/05, ventricular tachycardia;  Surgical Procedures:  cesarean section, hysterectomy, pin rt femur, Medtronic AICD implant June 2005;  Cardiology Procedures-Invasive:  cardiac cath (left) May 2005, replacement of fractured defibrillator lead 11/07, Medtronic generator change July 2009;  Cardiology Procedures-Noninvasive:  echocardiogram November 2009, echocardiogram June 2011, echocardiogram June 2013;  Cardiac Cath Results:  normal coronary arteries, global hypokinesis;  LVEF of 20% documented via echocardiogram on 11/12/2011 ____________________________ CARDIO-PULMONARY TEST DATES EKG Date:  05/21/2012;   Cardiac Cath Date:  11/02/2003;  Echocardiography Date: 11/22/2011;  Chest Xray Date: 05/18/2012;   ____________________________ FAMILY HISTORY Father - age 5, died of cancer; Mother - age 11, died of age PVD; Brother 1 - age 73, shot; Sister 1 -  alive and well;  ____________________________ SOCIAL HISTORY Alcohol Use:  no alcohol use;  Smoking:  used to smoke but quit;  Diet:  regular diet;  Lifestyle:  married, 1 daughter and 1 son;  Exercise:  no regular exercise;  Occupation:  retired and Surveyor, minerals;  Residence:  lives with husband;   ____________________________ REVIEW OF SYSTEMS General:  denies recent weight change, fatique or change in exercise tolerance.Eyes:  wears eye glasses/contact lenses, decreased acuity O.D.  Respiratory:  denies dyspnea, cough, wheezing or hemoptysis.  Cardiovascular:  please review HPI  Abdominal:  denies dyspepsia, GI bleeding, constipation, or diarrhea  Genitourinary-Female:  no dysuria, urgency, frequency, UTIs  Musculoskeletal:  chronic low back pain  Psychiatric:  situational stress ____________________________ PHYSICAL EXAMINATION VITAL SIGNS  Blood Pressure:  120/80 Sitting, Right arm, regular cuff  , 128/84 Standing, Right arm and regular cuff   Pulse:  96/min. Weight:  170.00 lbs. Height:  62"BMI: 31  Constitutional:  pleasant African American female in no acute distress, mildly obese Skin:  warm and dry to touch, no apparent skin lesions, or masses noted. Head:  normocephalic, normal hair pattern, no masses or tenderness ENT:  ears, nose and throat reveal no gross abnormalities.  Dentition good. Neck:  supple, no masses, thyromegaly, JVD. Carotid pulses are full and equal bilaterally without bruits. Chest:  clear to auscultation and percussion, normal A-P diameter, healed ICD incision in the left pectoral area Cardiac:  S1,S2, no S3 or S4 present, regular rhythm, grade 2/6 early systolic murmur left sternal border Peripheral Pulses:  the femoral,dorsalis pedis, and posterior tibial pulses are full and equal bilaterally with no bruits auscultated. Extremities & Back:  no deformities, clubbing, cyanosis, erythema or edema observed. Normal muscle strength and tone. Neurological:  no gross motor or sensory deficits noted, affect appropriate, oriented x3. ____________________________ MOST RECENT LIPID PANEL 03/11/12  CHOL TOTL 174 mg/dl, LDL 104 calc, HDL 53 mg/dl, TRIGLYCER 85 mg/dl and CHOL/HDL 3.3 (Calc) ____________________________ IMPRESSIONS/PLAN   1. Worsening ventricular tachycardia in a patient with known idiopathic cardiomyopathy 2. Functioning implantable defibrillator 3. Nonischemic cardiomyopathy 4. Long-term anticoagulation with warfarin  Recommendations:  Discussion with Dr. Caryl Comes, extensive review of hospital records and defibrillator strips. After discussion we are gonna start her on amiodarone 200 mg daily for  suppression of ventricular tachycardia. She also will need to get a followup appointment with Dr. Caryl Comes for reprogramming of her device. I will see her in followup in 6 weeks. At that point in time we will likely reduce her amiodarone. She will need to have her protime moved up because of the interaction of the amiodarone with warfarin. In addition she is to discontinue simvastatin. ____________________________ TODAYS ORDERS  1. 12 Lead EKG: Today  2. Return Visit: 6 weeks  3. [facility] Visit: 2 weeks recheck PT/INR                       ____________________________ Cardiology Physician:  Kerry Hough MD [facility]

## 2012-05-23 ENCOUNTER — Ambulatory Visit (INDEPENDENT_AMBULATORY_CARE_PROVIDER_SITE_OTHER): Payer: Medicare Other | Admitting: *Deleted

## 2012-05-23 ENCOUNTER — Encounter: Payer: Self-pay | Admitting: Internal Medicine

## 2012-05-23 DIAGNOSIS — I5022 Chronic systolic (congestive) heart failure: Secondary | ICD-10-CM

## 2012-05-23 DIAGNOSIS — I428 Other cardiomyopathies: Secondary | ICD-10-CM | POA: Diagnosis not present

## 2012-05-23 DIAGNOSIS — I509 Heart failure, unspecified: Secondary | ICD-10-CM

## 2012-05-23 LAB — ICD DEVICE OBSERVATION
AL IMPEDENCE ICD: 552 Ohm
ATRIAL PACING ICD: 3 pct
LV LEAD IMPEDENCE ICD: 616 Ohm
RV LEAD IMPEDENCE ICD: 424 Ohm
TZAT-0001ATACH: 1
TZAT-0001ATACH: 2
TZAT-0001ATACH: 3
TZAT-0001FASTVT: 1
TZAT-0001SLOWVT: 1
TZAT-0001SLOWVT: 2
TZAT-0002ATACH: NEGATIVE
TZAT-0011FASTVT: 10 ms
TZAT-0012ATACH: 150 ms
TZAT-0012ATACH: 150 ms
TZAT-0012SLOWVT: 200 ms
TZAT-0013SLOWVT: 2
TZAT-0013SLOWVT: 2
TZAT-0018ATACH: NEGATIVE
TZAT-0018ATACH: NEGATIVE
TZAT-0018ATACH: NEGATIVE
TZAT-0018FASTVT: NEGATIVE
TZAT-0018SLOWVT: NEGATIVE
TZAT-0018SLOWVT: NEGATIVE
TZAT-0019ATACH: 6 V
TZAT-0019FASTVT: 8 V
TZAT-0019SLOWVT: 8 V
TZAT-0020ATACH: 1.5 ms
TZAT-0020SLOWVT: 1.5 ms
TZAT-0020SLOWVT: 1.5 ms
TZON-0003ATACH: 350 ms
TZON-0003FASTVT: 250 ms
TZON-0004SLOWVT: 16
TZON-0004VSLOWVT: 20
TZON-0005SLOWVT: 12
TZST-0001ATACH: 4
TZST-0001ATACH: 5
TZST-0001ATACH: 6
TZST-0001FASTVT: 3
TZST-0001FASTVT: 6
TZST-0001SLOWVT: 3
TZST-0001SLOWVT: 5
TZST-0001SLOWVT: 6
TZST-0002ATACH: NEGATIVE
TZST-0002ATACH: NEGATIVE
TZST-0002ATACH: NEGATIVE
TZST-0003FASTVT: 35 J
TZST-0003FASTVT: 35 J
TZST-0003SLOWVT: 35 J
TZST-0003SLOWVT: 35 J

## 2012-05-23 NOTE — Progress Notes (Signed)
ICD interrogation

## 2012-05-28 DIAGNOSIS — H4011X0 Primary open-angle glaucoma, stage unspecified: Secondary | ICD-10-CM | POA: Diagnosis not present

## 2012-06-11 DIAGNOSIS — I509 Heart failure, unspecified: Secondary | ICD-10-CM | POA: Diagnosis not present

## 2012-06-11 DIAGNOSIS — Q210 Ventricular septal defect: Secondary | ICD-10-CM | POA: Diagnosis not present

## 2012-06-11 DIAGNOSIS — E669 Obesity, unspecified: Secondary | ICD-10-CM | POA: Diagnosis not present

## 2012-06-11 DIAGNOSIS — I472 Ventricular tachycardia: Secondary | ICD-10-CM | POA: Diagnosis not present

## 2012-06-11 DIAGNOSIS — Z95810 Presence of automatic (implantable) cardiac defibrillator: Secondary | ICD-10-CM | POA: Diagnosis not present

## 2012-06-11 DIAGNOSIS — I501 Left ventricular failure: Secondary | ICD-10-CM | POA: Diagnosis not present

## 2012-06-11 DIAGNOSIS — Z7901 Long term (current) use of anticoagulants: Secondary | ICD-10-CM | POA: Diagnosis not present

## 2012-06-11 DIAGNOSIS — I428 Other cardiomyopathies: Secondary | ICD-10-CM | POA: Diagnosis not present

## 2012-07-02 ENCOUNTER — Encounter: Payer: Self-pay | Admitting: Cardiology

## 2012-07-02 DIAGNOSIS — I472 Ventricular tachycardia: Secondary | ICD-10-CM | POA: Diagnosis not present

## 2012-07-02 DIAGNOSIS — Z7901 Long term (current) use of anticoagulants: Secondary | ICD-10-CM | POA: Diagnosis not present

## 2012-07-02 DIAGNOSIS — I501 Left ventricular failure: Secondary | ICD-10-CM | POA: Diagnosis not present

## 2012-07-02 DIAGNOSIS — I428 Other cardiomyopathies: Secondary | ICD-10-CM | POA: Diagnosis not present

## 2012-07-02 DIAGNOSIS — E669 Obesity, unspecified: Secondary | ICD-10-CM | POA: Diagnosis not present

## 2012-07-02 DIAGNOSIS — Q210 Ventricular septal defect: Secondary | ICD-10-CM | POA: Diagnosis not present

## 2012-07-02 DIAGNOSIS — I509 Heart failure, unspecified: Secondary | ICD-10-CM | POA: Diagnosis not present

## 2012-07-02 DIAGNOSIS — Z79899 Other long term (current) drug therapy: Secondary | ICD-10-CM | POA: Diagnosis not present

## 2012-07-02 NOTE — Progress Notes (Signed)
Patient ID: Casey [name], female   DOB: 06-13-1934, 78 y.o.   MRN: [id number]  Casey [name], Casey [name]  Date of visit:  07/02/2012 DOB:  05-25-1935    Age:  78 yrs. Medical record number:  [id number]     Account number:  [id number] Primary Care Provider: Emeline General ____________________________ CURRENT DIAGNOSES  1. Amiodarone Treatment  2. Cardiomyopathy Idiopathic  3. Congestive Heart Failure Left  4. Interventricular Septal Defect  5. Ventricular tachycardia  6. Obesity(BMI30-40)  7. AICD in situ ____________________________ ALLERGIES  Candesartan Cilexetil, Nausea and vomiting  Codeine Sulf, Rash  Hydralazine HCl, Lethargy  Isosorbide Dinitrate, Lethargy ____________________________ MEDICATIONS  1. Vitamin D3 400 unit Capsule, 1 p.o. daily  2. allopurinol 100 mg tablet, 1 p.o. daily  3. warfarin 4 mg tablet, 6mg  2 x week 4mg  other days  4. Klor-Con M20 20 mEq tablet,ER particles/crystals, 1 p.o. q.d.  5. lisinopril 20 mg tablet, BID  6. metoprolol tartrate 50 mg tablet, 1/2 tab b.i.d.  7. furosemide 40 mg tablet, 1 p.o. daily  8. Azopt 1 % drops,suspension, 1 gtt od bid  9. Simbrinza 1-0.2 % drops,suspension, 1 gtt od bid  10. amiodarone 200 mg tablet, 1 p.o. daily ____________________________ CHIEF COMPLAINTS  Followup of Cardiomyopathy Idiopathic ____________________________ HISTORY OF PRESENT ILLNESS  Patient seen for cardiac followup. She has been feeling much better since she was here and is tolerating amiodarone well. She has had much less palpitations and his not having much in the way of dizziness anymore. She has had no further defibrillator discharges. She evidently was seen in the device clinic  but I can't really get those records to tell what was done.. She resolved really having any shortness of breath. She denies angina and has no PND, orthopnea or edema. She was unable to lose any significant weight over Christmas. ____________________________ PAST  HISTORY  Past Medical Illnesses:  history of peptic ulcer disease, lumbar disc disease, left arm  DVT 12/07, DM-diet controlled, gout;  Cardiovascular Illnesses:  cardiomyopathy(idiopathic), CHF, insertion of biventricular defibrillator 6/05, ventricular tachycardia;  Surgical Procedures:  cesarean section, hysterectomy, pin rt femur, Medtronic AICD implant June 2005;  Cardiology Procedures-Invasive:  cardiac cath (left) May 2005, replacement of fractured defibrillator lead 11/07, Medtronic generator change July 2009;  Cardiology Procedures-Noninvasive:  echocardiogram November 2009, echocardiogram June 2011, echocardiogram June 2013;  Cardiac Cath Results:  normal coronary arteries, global hypokinesis;  LVEF of 20% documented via echocardiogram on 11/12/2011 ____________________________ CARDIO-PULMONARY TEST DATES EKG Date:  05/21/2012;   Cardiac Cath Date:  11/02/2003;  Echocardiography Date: 11/22/2011;  Chest Xray Date: 05/18/2012;   ____________________________ SOCIAL HISTORY Alcohol Use:  no alcohol use;  Smoking:  used to smoke but quit;  Diet:  regular diet;  Lifestyle:  married, 1 daughter and 1 son;  Exercise:  no regular exercise;  Occupation:  retired and Surveyor, minerals;  Residence:  lives with husband;   ____________________________ REVIEW OF SYSTEMS General:  denies recent weight change, fatique or change in exercise tolerance.Eyes:  wears eye glasses/contact lenses, decreased acuity O.D.  Respiratory:  denies dyspnea, cough, wheezing or hemoptysis.Cardiovascular:  please review HPI Abdominal:  denies dyspepsia, GI bleeding, constipation, or diarrhea Genitourinary-Female:  no dysuria, urgency, frequency, UTIs, or stress incontinence  Musculoskeletal:  chronic low back pain  Psychiatric:  situational stress ____________________________ PHYSICAL EXAMINATION VITAL SIGNS  Blood Pressure:  130/80 Sitting, Right arm, regular cuff  , 134/86 Standing, Right arm and regular cuff   Pulse:  78/min.  Weight:  170.00  lbs. Height:  62"BMI: 31  Constitutional:  pleasant African American female in no acute distress, mildly obese Skin:  warm and dry to touch, no apparent skin lesions, or masses noted. Head:  normocephalic, normal hair pattern, no masses or tenderness ENT:  ears, nose and throat reveal no gross abnormalities.  Dentition good. Neck:  supple, no masses, thyromegaly, JVD. Carotid pulses are full and equal bilaterally without bruits. Chest:  clear to auscultation and percussion, normal A-P diameter, healed ICD incision in the left pectoral area Cardiac:  S1,S2, no S3 or S4 present, regular rhythm, grade 2/6 early systolic murmur left sternal border Peripheral Pulses:  the femoral,dorsalis pedis, and posterior tibial pulses are full and equal bilaterally with no bruits auscultated. Extremities & Back:  no deformities, clubbing, cyanosis, erythema or edema observed. Normal muscle strength and tone. Neurological:  no gross motor or sensory deficits noted, affect appropriate, oriented x3. ____________________________ MOST RECENT LIPID PANEL 03/11/12  CHOL TOTL 174 mg/dl, LDL 104 calc, HDL 53 mg/dl, TRIGLYCER 85 mg/dl and CHOL/HDL 3.3 (Calc) ____________________________ IMPRESSIONS/PLAN  1. Idiopathic cardiomyopathy 2. Ventricular tachycardia clinically improved on amiodarone 3. Functioning entitled defibrillator 4. Long-term anticoagulation with warfarin above therapeutic today  Recommendations:  Reduce amiodarone to 200 mg daily. She is due to followup Dr. Caryl Comes soon. We'll need to monitor her warfarin frequently because of interaction with amiodarone. Followup in 3 months. Call if problems. ____________________________ TODAYS ORDERS  1. Coag Clinic Visit: Coag OV 7 days  2. Return Visit: 3 months                       ____________________________ Cardiology Physician:  Kerry Hough MD [facility]

## 2012-07-08 ENCOUNTER — Encounter: Payer: Self-pay | Admitting: Internal Medicine

## 2012-07-08 ENCOUNTER — Ambulatory Visit (INDEPENDENT_AMBULATORY_CARE_PROVIDER_SITE_OTHER): Payer: Medicare Other | Admitting: Internal Medicine

## 2012-07-08 VITALS — BP 146/85 | HR 70 | Ht 62.0 in | Wt 171.0 lb

## 2012-07-08 DIAGNOSIS — I509 Heart failure, unspecified: Secondary | ICD-10-CM

## 2012-07-08 DIAGNOSIS — I5022 Chronic systolic (congestive) heart failure: Secondary | ICD-10-CM | POA: Diagnosis not present

## 2012-07-08 DIAGNOSIS — I472 Ventricular tachycardia: Secondary | ICD-10-CM | POA: Diagnosis not present

## 2012-07-08 DIAGNOSIS — Z95810 Presence of automatic (implantable) cardiac defibrillator: Secondary | ICD-10-CM | POA: Diagnosis not present

## 2012-07-08 DIAGNOSIS — I4729 Other ventricular tachycardia: Secondary | ICD-10-CM

## 2012-07-08 DIAGNOSIS — I4891 Unspecified atrial fibrillation: Secondary | ICD-10-CM

## 2012-07-08 LAB — ICD DEVICE OBSERVATION
AL IMPEDENCE ICD: 472 Ohm
ATRIAL PACING ICD: 29 pct
LV LEAD IMPEDENCE ICD: 584 Ohm
RV LEAD IMPEDENCE ICD: 408 Ohm
TZAT-0001ATACH: 2
TZAT-0002ATACH: NEGATIVE
TZAT-0004SLOWVT: 8
TZAT-0004SLOWVT: 8
TZAT-0005SLOWVT: 88 pct
TZAT-0011FASTVT: 10 ms
TZAT-0011SLOWVT: 10 ms
TZAT-0011SLOWVT: 10 ms
TZAT-0012ATACH: 150 ms
TZAT-0012ATACH: 150 ms
TZAT-0012FASTVT: 200 ms
TZAT-0012SLOWVT: 200 ms
TZAT-0012SLOWVT: 200 ms
TZAT-0018ATACH: NEGATIVE
TZAT-0018FASTVT: NEGATIVE
TZAT-0019ATACH: 6 V
TZAT-0019ATACH: 6 V
TZAT-0019FASTVT: 8 V
TZAT-0020ATACH: 1.5 ms
TZAT-0020ATACH: 1.5 ms
TZAT-0020ATACH: 1.5 ms
TZAT-0020FASTVT: 1.5 ms
TZON-0003ATACH: 350 ms
TZON-0003FASTVT: 250 ms
TZON-0003SLOWVT: 380 ms
TZON-0003VSLOWVT: 540.5 ms
TZST-0001ATACH: 4
TZST-0001FASTVT: 3
TZST-0001FASTVT: 4
TZST-0001FASTVT: 5
TZST-0001SLOWVT: 4
TZST-0001SLOWVT: 5
TZST-0002ATACH: NEGATIVE
TZST-0003FASTVT: 25 J
TZST-0003FASTVT: 35 J
TZST-0003SLOWVT: 25 J
TZST-0003SLOWVT: 35 J
TZST-0003SLOWVT: 35 J

## 2012-07-08 NOTE — Assessment & Plan Note (Signed)
No intercurrent atrial fibrillation on warfarin

## 2012-07-08 NOTE — Progress Notes (Signed)
S,kf Patient Care Team: Casey Showers, MD as PCP - General (Internal Medicine)   HPI  Casey [name] is a 78 y.o. female seen in followup for CHF in the setting of Nonischemic cardiomyopathy for which she is s/p Medtronic CRT implant.  She underwent ICD implantation years ago with by the upgrade about 2005. In 2007 she had failure of her defibrillator lead resulting in inappropriate shocks and requiring lead revision.   She [name] seen by Dr. Wynonia [name] recently. She had appropriate therapy for ventricular tachycardia. Cycle length of the ventricular tachycardia [name] less than 300 ms  She has been feeling well and tolerating her amiodarone. She saw Dr. Wynonia [name] last week and is scheduled to see him again in 3 months  Past Medical History  Diagnosis Date  . Cardiomyopathy   . CHF (congestive heart failure)   . Hypertension   . Biventricular ICD (implantable cardiac defibrillator) in place     medtronic     No past surgical history on file.  Current Outpatient Prescriptions  Medication Sig Dispense Refill  . allopurinol (ZYLOPRIM) 100 MG tablet Take 100 mg by mouth daily.      Marland Kitchen amiodarone (PACERONE) 200 MG tablet Take 200 mg by mouth daily.      . cholecalciferol (VITAMIN D) 1000 UNITS tablet Take 800 Units by mouth daily.       . furosemide (LASIX) 40 MG tablet Take 40 mg by mouth daily.        Marland Kitchen lisinopril (PRINIVIL,ZESTRIL) 20 MG tablet Take 20 mg by mouth 2 (two) times daily.        . metoprolol (LOPRESSOR) 50 MG tablet Take 25 mg by mouth 2 (two) times daily.       . Multiple Vitamins-Calcium (ONE-A-DAY WOMENS FORMULA) TABS Take 1 tablet by mouth daily.        . potassium chloride SA (K-DUR,KLOR-CON) 20 MEQ tablet Take 20 mEq by mouth daily.        Marland Kitchen warfarin (COUMADIN) 4 MG tablet Take 4 mg by mouth daily.       . brinzolamide (AZOPT) 1 % ophthalmic suspension Place 1 drop into the right eye 2 (two) times daily.        Allergies  Allergen Reactions  . Codeine     Unknown      Review of Systems negative except from HPI and PMH  Physical Exam BP 146/85  Pulse 70  Ht 5\' 2"  (1.575 m)  Wt 171 lb (77.565 kg)  BMI 31.28 kg/m2  SpO2 97% Well developed and well nourished in no acute distress HENT normal E scleral and icterus clear Neck Supple JVP 8-9  Clear to ausculation Regular rate and rhythm, no murmurs gallops or rub Soft with active bowel sounds No clubbing cyanosis none Edema Alert and oriented, grossly normal motor and sensory function Skin Warm and Dry  Electrocardiogram P. synchronous pacing  Assessment and  Plan

## 2012-07-08 NOTE — Patient Instructions (Addendum)
Your physician wants you to follow-up in: December with Dr Gari Crown will receive a reminder letter in the mail two months in advance. If you don't receive a letter, please call our office to schedule the follow-up appointment.

## 2012-07-08 NOTE — Assessment & Plan Note (Signed)
Euvolemic we'll continue current medications 

## 2012-07-08 NOTE — Assessment & Plan Note (Signed)
The patient's device was interrogated and the information was fully reviewed.  The device was reprogrammed to allow for monitoring of ventricular tachycardia at rates between 110-120 beats per minute

## 2012-07-08 NOTE — Assessment & Plan Note (Signed)
Patient has ventricular tachycardia currently on amiodarone. Ventricular tachycardia cycle length was under 300 ms. There is plenty of room for her detection rate to allow for slowing of VT. She had an episode of VT at about 120 beats per minute and a monitoring zone has been programmed down to 110 beats per minute to follow this slow ventricular tachycardia.  Amiodarone is being monitored by Dr. Wynonia Lawman

## 2012-07-22 DIAGNOSIS — Z7901 Long term (current) use of anticoagulants: Secondary | ICD-10-CM | POA: Diagnosis not present

## 2012-07-22 DIAGNOSIS — Q210 Ventricular septal defect: Secondary | ICD-10-CM | POA: Diagnosis not present

## 2012-07-22 DIAGNOSIS — I472 Ventricular tachycardia: Secondary | ICD-10-CM | POA: Diagnosis not present

## 2012-07-22 DIAGNOSIS — E669 Obesity, unspecified: Secondary | ICD-10-CM | POA: Diagnosis not present

## 2012-07-22 DIAGNOSIS — I428 Other cardiomyopathies: Secondary | ICD-10-CM | POA: Diagnosis not present

## 2012-07-22 DIAGNOSIS — I501 Left ventricular failure: Secondary | ICD-10-CM | POA: Diagnosis not present

## 2012-07-22 DIAGNOSIS — Z95810 Presence of automatic (implantable) cardiac defibrillator: Secondary | ICD-10-CM | POA: Diagnosis not present

## 2012-08-20 DIAGNOSIS — H4011X0 Primary open-angle glaucoma, stage unspecified: Secondary | ICD-10-CM | POA: Diagnosis not present

## 2012-09-03 ENCOUNTER — Other Ambulatory Visit: Payer: Self-pay

## 2012-09-03 DIAGNOSIS — Z1231 Encounter for screening mammogram for malignant neoplasm of breast: Secondary | ICD-10-CM

## 2012-09-10 DIAGNOSIS — H4011X0 Primary open-angle glaucoma, stage unspecified: Secondary | ICD-10-CM | POA: Diagnosis not present

## 2012-09-16 ENCOUNTER — Other Ambulatory Visit: Payer: Medicare Other | Admitting: Internal Medicine

## 2012-09-17 ENCOUNTER — Ambulatory Visit: Payer: Medicare Other | Admitting: Internal Medicine

## 2012-09-19 DIAGNOSIS — E669 Obesity, unspecified: Secondary | ICD-10-CM | POA: Diagnosis not present

## 2012-09-19 DIAGNOSIS — I501 Left ventricular failure: Secondary | ICD-10-CM | POA: Diagnosis not present

## 2012-09-19 DIAGNOSIS — I428 Other cardiomyopathies: Secondary | ICD-10-CM | POA: Diagnosis not present

## 2012-09-19 DIAGNOSIS — Q210 Ventricular septal defect: Secondary | ICD-10-CM | POA: Diagnosis not present

## 2012-09-19 DIAGNOSIS — Z95810 Presence of automatic (implantable) cardiac defibrillator: Secondary | ICD-10-CM | POA: Diagnosis not present

## 2012-09-19 DIAGNOSIS — I472 Ventricular tachycardia: Secondary | ICD-10-CM | POA: Diagnosis not present

## 2012-09-19 DIAGNOSIS — Z7901 Long term (current) use of anticoagulants: Secondary | ICD-10-CM | POA: Diagnosis not present

## 2012-09-25 ENCOUNTER — Ambulatory Visit
Admission: RE | Admit: 2012-09-25 | Discharge: 2012-09-25 | Disposition: A | Discharge status: Home / Self Care | Payer: Medicare Other | Source: Ambulatory Visit

## 2012-09-25 DIAGNOSIS — Z1231 Encounter for screening mammogram for malignant neoplasm of breast: Secondary | ICD-10-CM

## 2012-09-26 DIAGNOSIS — Z7901 Long term (current) use of anticoagulants: Secondary | ICD-10-CM | POA: Diagnosis not present

## 2012-09-26 DIAGNOSIS — I428 Other cardiomyopathies: Secondary | ICD-10-CM | POA: Diagnosis not present

## 2012-09-26 DIAGNOSIS — I472 Ventricular tachycardia: Secondary | ICD-10-CM | POA: Diagnosis not present

## 2012-09-26 DIAGNOSIS — I501 Left ventricular failure: Secondary | ICD-10-CM | POA: Diagnosis not present

## 2012-09-26 DIAGNOSIS — Z95810 Presence of automatic (implantable) cardiac defibrillator: Secondary | ICD-10-CM | POA: Diagnosis not present

## 2012-09-26 DIAGNOSIS — Q210 Ventricular septal defect: Secondary | ICD-10-CM | POA: Diagnosis not present

## 2012-09-26 DIAGNOSIS — E669 Obesity, unspecified: Secondary | ICD-10-CM | POA: Diagnosis not present

## 2012-10-04 DIAGNOSIS — Q210 Ventricular septal defect: Secondary | ICD-10-CM | POA: Diagnosis not present

## 2012-10-04 DIAGNOSIS — I501 Left ventricular failure: Secondary | ICD-10-CM | POA: Diagnosis not present

## 2012-10-04 DIAGNOSIS — E669 Obesity, unspecified: Secondary | ICD-10-CM | POA: Diagnosis not present

## 2012-10-04 DIAGNOSIS — I472 Ventricular tachycardia: Secondary | ICD-10-CM | POA: Diagnosis not present

## 2012-10-04 DIAGNOSIS — Z95810 Presence of automatic (implantable) cardiac defibrillator: Secondary | ICD-10-CM | POA: Diagnosis not present

## 2012-10-04 DIAGNOSIS — I428 Other cardiomyopathies: Secondary | ICD-10-CM | POA: Diagnosis not present

## 2012-10-04 DIAGNOSIS — Z7901 Long term (current) use of anticoagulants: Secondary | ICD-10-CM | POA: Diagnosis not present

## 2012-10-21 ENCOUNTER — Other Ambulatory Visit: Payer: Medicare Other | Admitting: Internal Medicine

## 2012-10-21 DIAGNOSIS — E119 Type 2 diabetes mellitus without complications: Secondary | ICD-10-CM | POA: Diagnosis not present

## 2012-10-21 DIAGNOSIS — Z79899 Other long term (current) drug therapy: Secondary | ICD-10-CM

## 2012-10-21 DIAGNOSIS — E785 Hyperlipidemia, unspecified: Secondary | ICD-10-CM

## 2012-10-21 LAB — LIPID PANEL
Cholesterol: 257 mg/dL — ABNORMAL HIGH (ref 0–200)
HDL: 59 mg/dL (ref >39)
LDL Cholesterol: 180 mg/dL — ABNORMAL HIGH (ref 0–99)
Total CHOL/HDL Ratio: 4.4 Ratio
Triglycerides: 92 mg/dL (ref <150)
VLDL: 18 mg/dL (ref 0–40)

## 2012-10-21 LAB — HEPATIC FUNCTION PANEL
ALT: 21 U/L (ref 0–35)
Albumin: 4 g/dL (ref 3.5–5.2)
Indirect Bilirubin: 1 mg/dL — ABNORMAL HIGH (ref 0.0–0.9)
Total Protein: 6.2 g/dL (ref 6.0–8.3)

## 2012-10-21 LAB — HEMOGLOBIN A1C: Mean Plasma Glucose: 117 mg/dL — ABNORMAL HIGH (ref <117)

## 2012-10-22 ENCOUNTER — Ambulatory Visit (INDEPENDENT_AMBULATORY_CARE_PROVIDER_SITE_OTHER): Payer: Medicare Other | Admitting: Internal Medicine

## 2012-10-22 ENCOUNTER — Encounter: Payer: Self-pay | Admitting: Internal Medicine

## 2012-10-22 VITALS — BP 134/84 | HR 76 | Ht 62.5 in | Wt 172.0 lb

## 2012-10-22 DIAGNOSIS — I472 Ventricular tachycardia: Secondary | ICD-10-CM | POA: Diagnosis not present

## 2012-10-22 DIAGNOSIS — I428 Other cardiomyopathies: Secondary | ICD-10-CM | POA: Diagnosis not present

## 2012-10-22 DIAGNOSIS — I10 Essential (primary) hypertension: Secondary | ICD-10-CM

## 2012-10-22 DIAGNOSIS — Z8679 Personal history of other diseases of the circulatory system: Secondary | ICD-10-CM

## 2012-10-22 DIAGNOSIS — E119 Type 2 diabetes mellitus without complications: Secondary | ICD-10-CM

## 2012-10-22 DIAGNOSIS — E785 Hyperlipidemia, unspecified: Secondary | ICD-10-CM

## 2012-10-22 DIAGNOSIS — Z950 Presence of cardiac pacemaker: Secondary | ICD-10-CM

## 2012-10-22 NOTE — Progress Notes (Signed)
  Subjective:    Patient ID: Casey [name], female    DOB: [birth year], 78 y.o.   MRN: [id number]  HPI  In December 2013, her defibrillator fired and she presented to the emergency department. She was not hospitalized. She continues to see Dr. Wynonia Lawman and Dr. Caryl Comes for Cardiology care. She is trying to watch her diet. Gave her a list of foods high in vitamin K. She wanted permission to eat brussels sprouts but they are high in vitamin K. She is on Coumadin. Has protime checked every 4 weeks by Cardiologist. Hemoglobin A1c is under good control with diet alone. Blood pressure is stable. Says she is trying to worry less. Husband Radiographer, therapeutic shop so they are essentially retired with the exception of him having just a few clients at home. They have been married 72 years today. He has COPD and she is worried about his health. History of nonischemic cardiomyopathy, hyperlipidemia, type 2 diabetes mellitus, hypertension. History of VSD.    Review of Systems     Objective:   Physical Exam Skin warm and dry. Nodes none. Neck is supple without JVD, thyromegaly, or carotid bruits. Chest clear to auscultation without rales. Cardiac exam : regular rate and rhythm. Murmur unchanged. Extremities without edema. Alert and oriented.        Assessment & Plan:  Nonischemic cardiomyopathy  History of pacemaker  History of ventricular tachycardia  Chronic Coumadin therapy  Diet controlled diabetes mellitus  History of VSD  Hyperlipidemia  Hypertension  Plan: Return in 6 months for physical exam. Continue same medications.

## 2012-11-01 DIAGNOSIS — I428 Other cardiomyopathies: Secondary | ICD-10-CM | POA: Diagnosis not present

## 2012-11-01 DIAGNOSIS — I501 Left ventricular failure: Secondary | ICD-10-CM | POA: Diagnosis not present

## 2012-11-01 DIAGNOSIS — Z95810 Presence of automatic (implantable) cardiac defibrillator: Secondary | ICD-10-CM | POA: Diagnosis not present

## 2012-11-01 DIAGNOSIS — I472 Ventricular tachycardia: Secondary | ICD-10-CM | POA: Diagnosis not present

## 2012-11-01 DIAGNOSIS — Q210 Ventricular septal defect: Secondary | ICD-10-CM | POA: Diagnosis not present

## 2012-11-01 DIAGNOSIS — Z7901 Long term (current) use of anticoagulants: Secondary | ICD-10-CM | POA: Diagnosis not present

## 2012-11-01 DIAGNOSIS — E669 Obesity, unspecified: Secondary | ICD-10-CM | POA: Diagnosis not present

## 2012-11-10 NOTE — Patient Instructions (Addendum)
Continue same medications and return in 6 months. Avoid foods high in vitamin K.

## 2012-11-11 DIAGNOSIS — H4011X0 Primary open-angle glaucoma, stage unspecified: Secondary | ICD-10-CM | POA: Diagnosis not present

## 2012-11-11 DIAGNOSIS — Z961 Presence of intraocular lens: Secondary | ICD-10-CM | POA: Diagnosis not present

## 2012-11-29 DIAGNOSIS — Q210 Ventricular septal defect: Secondary | ICD-10-CM | POA: Diagnosis not present

## 2012-11-29 DIAGNOSIS — Z7901 Long term (current) use of anticoagulants: Secondary | ICD-10-CM | POA: Diagnosis not present

## 2012-11-29 DIAGNOSIS — I472 Ventricular tachycardia: Secondary | ICD-10-CM | POA: Diagnosis not present

## 2012-11-29 DIAGNOSIS — Z95810 Presence of automatic (implantable) cardiac defibrillator: Secondary | ICD-10-CM | POA: Diagnosis not present

## 2012-11-29 DIAGNOSIS — I501 Left ventricular failure: Secondary | ICD-10-CM | POA: Diagnosis not present

## 2012-11-29 DIAGNOSIS — E669 Obesity, unspecified: Secondary | ICD-10-CM | POA: Diagnosis not present

## 2012-11-29 DIAGNOSIS — I428 Other cardiomyopathies: Secondary | ICD-10-CM | POA: Diagnosis not present

## 2012-12-09 DIAGNOSIS — L819 Disorder of pigmentation, unspecified: Secondary | ICD-10-CM | POA: Diagnosis not present

## 2012-12-30 ENCOUNTER — Encounter: Payer: Self-pay | Admitting: Cardiology

## 2012-12-30 DIAGNOSIS — Z95810 Presence of automatic (implantable) cardiac defibrillator: Secondary | ICD-10-CM | POA: Diagnosis not present

## 2012-12-30 DIAGNOSIS — Z7901 Long term (current) use of anticoagulants: Secondary | ICD-10-CM | POA: Diagnosis not present

## 2012-12-30 DIAGNOSIS — I428 Other cardiomyopathies: Secondary | ICD-10-CM | POA: Diagnosis not present

## 2012-12-30 DIAGNOSIS — I501 Left ventricular failure: Secondary | ICD-10-CM | POA: Diagnosis not present

## 2012-12-30 DIAGNOSIS — E669 Obesity, unspecified: Secondary | ICD-10-CM | POA: Diagnosis not present

## 2012-12-30 DIAGNOSIS — Q210 Ventricular septal defect: Secondary | ICD-10-CM | POA: Diagnosis not present

## 2012-12-30 DIAGNOSIS — I472 Ventricular tachycardia: Secondary | ICD-10-CM | POA: Diagnosis not present

## 2012-12-30 NOTE — Progress Notes (Signed)
Patient ID: Casey [name], female   DOB: Jan 20, 1935, 78 y.o.   MRN: [id number]   Casey, [name]  Date of visit:  12/30/2012 DOB:  Sep 15, 1934    Age:  78 yrs. Medical record number:  [id number]     Account number:  [id number] Primary Care Provider: Emeline General ____________________________ CURRENT DIAGNOSES  1. Cardiomyopathy Idiopathic  2. Congestive Heart Failure Left  3. Interventricular Septal Defect  4. Ventricular tachycardia  5. Obesity(BMI30-40)  6. AICD in situ  7. Long Term Use Anticoagulant ____________________________ ALLERGIES  Candesartan, Severe nausea & vomiting  Codeine, Rash  Hydralazine, Lethargy  Isosorbide, Lethargy ____________________________ MEDICATIONS  1. Vitamin D3 400 unit Capsule, 1 p.o. daily  2. allopurinol 100 mg tablet, 1 p.o. daily  3. warfarin 4 mg tablet, 6mg  2 x week 4mg  other days  4. Klor-Con M20 20 mEq tablet,ER particles/crystals, 1 p.o. q.d.  5. lisinopril 20 mg tablet, BID  6. metoprolol tartrate 50 mg tablet, 1/2 tab b.i.d.  7. furosemide 40 mg tablet, 1 p.o. daily  8. Azopt 1 % drops,suspension, 1 gtt od bid  9. Simbrinza 1-0.2 % drops,suspension, 1 gtt od bid  10. amiodarone 200 mg tablet, 1 p.o. daily  11. multivitamin tablet, 1 p.o. daily ____________________________ CHIEF COMPLAINTS  Followup of Cardiomyopathy Idiopathic  Followup of Congestive Heart Failure Left ____________________________ HISTORY OF PRESENT ILLNESS  Patient seen for cardiac followup. She has been under significant situational stress with her husband. She has been able to lose about 7 pounds of weight. She denies angina and has no PND, orthopnea or edema. She is tolerating amiodarone well without complications. She has had no bleeding complications from warfarin. She has had no defibrillator discharges and the palpitations that she had previously have been improving. ____________________________ PAST HISTORY  Past Medical Illnesses:  history of  peptic ulcer disease, lumbar disc disease, left arm  DVT 12/07, DM-diet controlled, gout;  Cardiovascular Illnesses:  cardiomyopathy(idiopathic), CHF, insertion of biventricular defibrillator 6/05, ventricular tachycardia;  Surgical Procedures:  cesarean section, hysterectomy, pin rt femur, Medtronic AICD implant June 2005;  Cardiology Procedures-Invasive:  cardiac cath (left) May 2005, replacement of fractured defibrillator lead 11/07, Medtronic generator change July 2009;  Cardiology Procedures-Noninvasive:  echocardiogram November 2009, echocardiogram June 2011, echocardiogram June 2013;  Cardiac Cath Results:  normal coronary arteries, global hypokinesis;  LVEF of 20% documented via echocardiogram on 11/12/2011,   ____________________________ CARDIO-PULMONARY TEST DATES EKG Date:  05/21/2012;   Cardiac Cath Date:  11/02/2003;  Echocardiography Date: 11/22/2011;  Chest Xray Date: 05/18/2012;   ____________________________ FAMILY HISTORY Father -- Ischemic heart disease, Deceased Mother -- Heart Attack, Deceased ____________________________ SOCIAL HISTORY Alcohol Use:  no alcohol use;  Smoking:  used to smoke but quit;  Diet:  regular diet;  Lifestyle:  married, 1 daughter and 1 son;  Exercise:  no regular exercise;  Occupation:  retired and Surveyor, minerals;  Residence:  lives with husband;   ____________________________ REVIEW OF SYSTEMS General:  weight loss of approximately 5 lbs Eyes: wears eye glasses/contact lenses, decreased acuity O.D. Respiratory: denies dyspnea, cough, wheezing or hemoptysis. Cardiovascular:  please review HPI Abdominal: denies dyspepsia, GI bleeding, constipation, or diarrheaGenitourinary-Female: no dysuria, urgency, frequency, UTIs, or stress incontinence Musculoskeletal:  chronic low back pain Psychiatric:  situational stress  ____________________________ PHYSICAL EXAMINATION VITAL SIGNS  Blood Pressure:  128/80 Sitting, Left arm, regular cuff   Pulse:  80/min. Weight:   166.00 lbs. Height:  62"BMI: 30  Constitutional:  pleasant African American female in no  acute distress, mildly obese Skin:  warm and dry to touch, no apparent skin lesions, or masses noted. Head:  normocephalic, normal hair pattern, no masses or tenderness ENT:  ears, nose and throat reveal no gross abnormalities.  Dentition good. Neck:  supple, no masses, thyromegaly, JVD. Carotid pulses are full and equal bilaterally without bruits. Chest:  clear to auscultation and percussion, normal A-P diameter, healed ICD incision in the left pectoral area Cardiac:  S1,S2, no S3 or S4 present, regular rhythm, grade 2/6 early systolic murmur left sternal border Peripheral Pulses:  the femoral,dorsalis pedis, and posterior tibial pulses are full and equal bilaterally with no bruits auscultated. Extremities & Back:  no deformities, clubbing, cyanosis, erythema or edema observed. Normal muscle strength and tone. Neurological:  no gross motor or sensory deficits noted, affect appropriate, oriented x3. ____________________________ MOST RECENT LIPID PANEL 03/11/12  CHOL TOTL 174 mg/dl, LDL 104 calc, HDL 53 mg/dl, TRIGLYCER 85 mg/dl and CHOL/HDL 3.3 (Calc) ____________________________ IMPRESSIONS/PLAN  1. Idiopathic cardiomyopathy 2. Long-term anticoagulation with warfarin 3. Functioning implantable defibrillator 4. Treatment with amiodarone 5. Obesity but with weight loss since here  Recommendations:  Continue remote defibrillator checks. She will need to have a TSH as well as metabolic panel done to assess amiodarone. Otherwise followup in 6 months. ____________________________ TODAYS ORDERS  1. Coag Clinic Visit: Coag OV 1 month  2. Return Visit: 6 months  3. Comprehensive Metabolic Panel: Today  4. TSH: Today                       ____________________________ Cardiology Physician:  Kerry Hough MD [facility]

## 2012-12-31 DIAGNOSIS — E669 Obesity, unspecified: Secondary | ICD-10-CM | POA: Diagnosis not present

## 2012-12-31 DIAGNOSIS — Q210 Ventricular septal defect: Secondary | ICD-10-CM | POA: Diagnosis not present

## 2012-12-31 DIAGNOSIS — I501 Left ventricular failure: Secondary | ICD-10-CM | POA: Diagnosis not present

## 2012-12-31 DIAGNOSIS — I472 Ventricular tachycardia: Secondary | ICD-10-CM | POA: Diagnosis not present

## 2012-12-31 DIAGNOSIS — E039 Hypothyroidism, unspecified: Secondary | ICD-10-CM | POA: Diagnosis not present

## 2012-12-31 DIAGNOSIS — Z7901 Long term (current) use of anticoagulants: Secondary | ICD-10-CM | POA: Diagnosis not present

## 2012-12-31 DIAGNOSIS — I428 Other cardiomyopathies: Secondary | ICD-10-CM | POA: Diagnosis not present

## 2012-12-31 DIAGNOSIS — Z95810 Presence of automatic (implantable) cardiac defibrillator: Secondary | ICD-10-CM | POA: Diagnosis not present

## 2013-01-16 DIAGNOSIS — H4011X0 Primary open-angle glaucoma, stage unspecified: Secondary | ICD-10-CM | POA: Diagnosis not present

## 2013-01-27 DIAGNOSIS — E039 Hypothyroidism, unspecified: Secondary | ICD-10-CM | POA: Diagnosis not present

## 2013-01-27 DIAGNOSIS — Z95810 Presence of automatic (implantable) cardiac defibrillator: Secondary | ICD-10-CM | POA: Diagnosis not present

## 2013-01-27 DIAGNOSIS — Z7901 Long term (current) use of anticoagulants: Secondary | ICD-10-CM | POA: Diagnosis not present

## 2013-01-31 ENCOUNTER — Other Ambulatory Visit: Payer: Self-pay | Admitting: Internal Medicine

## 2013-02-03 DIAGNOSIS — I472 Ventricular tachycardia: Secondary | ICD-10-CM | POA: Diagnosis not present

## 2013-02-11 DIAGNOSIS — Z95810 Presence of automatic (implantable) cardiac defibrillator: Secondary | ICD-10-CM | POA: Diagnosis not present

## 2013-02-11 DIAGNOSIS — Z7901 Long term (current) use of anticoagulants: Secondary | ICD-10-CM | POA: Diagnosis not present

## 2013-02-11 DIAGNOSIS — E039 Hypothyroidism, unspecified: Secondary | ICD-10-CM | POA: Diagnosis not present

## 2013-02-18 DIAGNOSIS — Z7901 Long term (current) use of anticoagulants: Secondary | ICD-10-CM | POA: Diagnosis not present

## 2013-02-18 DIAGNOSIS — Z95810 Presence of automatic (implantable) cardiac defibrillator: Secondary | ICD-10-CM | POA: Diagnosis not present

## 2013-02-18 DIAGNOSIS — E039 Hypothyroidism, unspecified: Secondary | ICD-10-CM | POA: Diagnosis not present

## 2013-02-26 ENCOUNTER — Encounter: Payer: Self-pay | Admitting: Internal Medicine

## 2013-03-04 DIAGNOSIS — E039 Hypothyroidism, unspecified: Secondary | ICD-10-CM | POA: Diagnosis not present

## 2013-03-04 DIAGNOSIS — Z95810 Presence of automatic (implantable) cardiac defibrillator: Secondary | ICD-10-CM | POA: Diagnosis not present

## 2013-03-04 DIAGNOSIS — Z7901 Long term (current) use of anticoagulants: Secondary | ICD-10-CM | POA: Diagnosis not present

## 2013-03-06 ENCOUNTER — Ambulatory Visit (INDEPENDENT_AMBULATORY_CARE_PROVIDER_SITE_OTHER): Payer: Medicare Other | Admitting: Internal Medicine

## 2013-03-06 DIAGNOSIS — Z23 Encounter for immunization: Secondary | ICD-10-CM

## 2013-03-11 ENCOUNTER — Ambulatory Visit (INDEPENDENT_AMBULATORY_CARE_PROVIDER_SITE_OTHER): Payer: Medicare Other | Admitting: Internal Medicine

## 2013-03-11 ENCOUNTER — Encounter: Payer: Self-pay | Admitting: Internal Medicine

## 2013-03-11 ENCOUNTER — Ambulatory Visit
Admission: RE | Admit: 2013-03-11 | Discharge: 2013-03-11 | Disposition: A | Discharge status: Home / Self Care | Payer: Medicare Other | Source: Ambulatory Visit | Attending: Internal Medicine | Admitting: Internal Medicine

## 2013-03-11 VITALS — BP 132/88 | HR 80 | Temp 97.8°F | Wt 171.0 lb

## 2013-03-11 DIAGNOSIS — E039 Hypothyroidism, unspecified: Secondary | ICD-10-CM | POA: Diagnosis not present

## 2013-03-11 DIAGNOSIS — H6593 Unspecified nonsuppurative otitis media, bilateral: Secondary | ICD-10-CM

## 2013-03-11 DIAGNOSIS — R062 Wheezing: Secondary | ICD-10-CM

## 2013-03-11 DIAGNOSIS — H6590 Unspecified nonsuppurative otitis media, unspecified ear: Secondary | ICD-10-CM | POA: Diagnosis not present

## 2013-03-11 DIAGNOSIS — J309 Allergic rhinitis, unspecified: Secondary | ICD-10-CM

## 2013-03-11 MED ORDER — METHYLPREDNISOLONE ACETATE 80 MG/ML IJ SUSP
80.0000 mg | Freq: Once | INTRAMUSCULAR | Status: AC
  Administered 2013-03-11: 80 mg via INTRAMUSCULAR

## 2013-03-11 MED ORDER — ALBUTEROL SULFATE HFA 108 (90 BASE) MCG/ACT IN AERS: 2.0000 | INHALATION_SPRAY | Freq: Four times a day (QID) | RESPIRATORY_TRACT | Status: DC | PRN

## 2013-03-11 NOTE — Progress Notes (Addendum)
  Subjective:    Patient ID: Casey [name], female    DOB: 1934-10-02, 78 y.o.   MRN: [id number]  HPI Patient has been coughing and wheezing now for about a week. No recent URI symptoms that she is aware of. Denies fever chills or sore throat. Cough is nonproductive. Ears herself wheeze at night. She has a history of cardiomyopathy and has an implantable defibrillator. Cardiologist recently started her on thyroid replacement therapy. She thought that might be causing her wheezing. No one at home is ill.    Review of Systems     Objective:   Physical Exam Neck is supple without carotid bruits. No significant JVD at 45. Chest: decreased coarse breath sounds both lower lobes but no appreciable rales. No wheezing. Pulse oximetry is normal. Slightly boggy nasal mucosa. Pharynx is clear. TMs are full bilaterally but not red        Assessment & Plan:  Probable allergic rhinitis  Bilateral serous otitis media  History of cardiomyopathy  Has been placed on thyroid replacement therapy by cardiologist. TSH done by cardiologist recently is within normal limits. She is on amiodarone.  Plan: Patient will use albuterol inhaler 2 sprays by mouth 4 times a day as needed for wheezing but particularly 2 sprays before bedtime. Depo-Medrol 80 mg IM given in office today. I do not see a reason to place her on antibiotics. She'll have a chest x-ray.

## 2013-03-11 NOTE — Patient Instructions (Addendum)
Use albuterol inhaler 2 sprays by mouth 4 times daily as needed for wheezing but particularly 2 sprays before bedtime. You have been given an injection of Depo-Medrol today

## 2013-03-11 NOTE — Progress Notes (Signed)
Patient informed. 

## 2013-03-18 DIAGNOSIS — H4011X0 Primary open-angle glaucoma, stage unspecified: Secondary | ICD-10-CM | POA: Diagnosis not present

## 2013-03-19 DIAGNOSIS — Z95810 Presence of automatic (implantable) cardiac defibrillator: Secondary | ICD-10-CM | POA: Diagnosis not present

## 2013-03-19 DIAGNOSIS — E039 Hypothyroidism, unspecified: Secondary | ICD-10-CM | POA: Diagnosis not present

## 2013-03-19 DIAGNOSIS — Z7901 Long term (current) use of anticoagulants: Secondary | ICD-10-CM | POA: Diagnosis not present

## 2013-03-20 ENCOUNTER — Encounter: Payer: PRIVATE HEALTH INSURANCE | Admitting: Internal Medicine

## 2013-04-01 ENCOUNTER — Ambulatory Visit (INDEPENDENT_AMBULATORY_CARE_PROVIDER_SITE_OTHER): Payer: Medicare Other | Admitting: Internal Medicine

## 2013-04-01 ENCOUNTER — Encounter: Payer: Self-pay | Admitting: Internal Medicine

## 2013-04-01 ENCOUNTER — Encounter: Payer: Self-pay | Admitting: *Deleted

## 2013-04-01 VITALS — BP 141/90 | HR 77 | Ht 62.0 in | Wt 174.1 lb

## 2013-04-01 DIAGNOSIS — Z95810 Presence of automatic (implantable) cardiac defibrillator: Secondary | ICD-10-CM | POA: Diagnosis not present

## 2013-04-01 DIAGNOSIS — I509 Heart failure, unspecified: Secondary | ICD-10-CM | POA: Diagnosis not present

## 2013-04-01 DIAGNOSIS — I5022 Chronic systolic (congestive) heart failure: Secondary | ICD-10-CM | POA: Diagnosis not present

## 2013-04-01 DIAGNOSIS — I4891 Unspecified atrial fibrillation: Secondary | ICD-10-CM

## 2013-04-01 DIAGNOSIS — I472 Ventricular tachycardia: Secondary | ICD-10-CM

## 2013-04-01 LAB — ICD DEVICE OBSERVATION
AL AMPLITUDE: 2.9691 mv
AL IMPEDENCE ICD: 528 Ohm
ATRIAL PACING ICD: 14.56 pct
BAMS-0001: 170 {beats}/min
CHARGE TIME: 12.432 s
FVT: 0
PACEART VT: 0
RV LEAD IMPEDENCE ICD: 400 Ohm
RV LEAD THRESHOLD: 0.5 V
TOT-0001: 2
TOT-0002: 0
TZAT-0001ATACH: 1
TZAT-0001ATACH: 3
TZAT-0001SLOWVT: 1
TZAT-0001SLOWVT: 2
TZAT-0002ATACH: NEGATIVE
TZAT-0002ATACH: NEGATIVE
TZAT-0002ATACH: NEGATIVE
TZAT-0005SLOWVT: 88 pct
TZAT-0005SLOWVT: 91 pct
TZAT-0011FASTVT: 10 ms
TZAT-0011SLOWVT: 10 ms
TZAT-0012ATACH: 150 ms
TZAT-0012FASTVT: 200 ms
TZAT-0013SLOWVT: 2
TZAT-0013SLOWVT: 2
TZAT-0018ATACH: NEGATIVE
TZAT-0018ATACH: NEGATIVE
TZAT-0018FASTVT: NEGATIVE
TZAT-0018SLOWVT: NEGATIVE
TZAT-0018SLOWVT: NEGATIVE
TZAT-0019ATACH: 6 V
TZAT-0019ATACH: 6 V
TZAT-0019FASTVT: 8 V
TZON-0003FASTVT: 250 ms
TZON-0004SLOWVT: 100
TZON-0005SLOWVT: 12
TZST-0001ATACH: 4
TZST-0001ATACH: 5
TZST-0001FASTVT: 3
TZST-0001FASTVT: 6
TZST-0001SLOWVT: 3
TZST-0001SLOWVT: 4
TZST-0001SLOWVT: 6
TZST-0002ATACH: NEGATIVE
TZST-0002ATACH: NEGATIVE
TZST-0003FASTVT: 35 J
TZST-0003FASTVT: 35 J
TZST-0003SLOWVT: 35 J
TZST-0003SLOWVT: 35 J

## 2013-04-01 LAB — COMPREHENSIVE METABOLIC PANEL
ALT: 44 U/L — ABNORMAL HIGH (ref 0–35)
BUN: 22 mg/dL (ref 6–23)
CO2: 25 mEq/L (ref 19–32)
Creatinine, Ser: 1.2 mg/dL (ref 0.4–1.2)
GFR: 56.68 mL/min — ABNORMAL LOW (ref >60.00)
Total Bilirubin: 1.8 mg/dL — ABNORMAL HIGH (ref 0.3–1.2)

## 2013-04-01 LAB — CBC WITH DIFFERENTIAL/PLATELET
Basophils Relative: 0.7 % (ref 0.0–3.0)
Eosinophils Relative: 0.9 % (ref 0.0–5.0)
HCT: 44.9 % (ref 36.0–46.0)
Hemoglobin: 15.2 g/dL — ABNORMAL HIGH (ref 12.0–15.0)
Lymphocytes Relative: 18.2 % (ref 12.0–46.0)
Lymphs Abs: 1 10*3/uL (ref 0.7–4.0)
Monocytes Relative: 10 % (ref 3.0–12.0)
Neutro Abs: 4 10*3/uL (ref 1.4–7.7)
RBC: 4.44 Mil/uL (ref 3.87–5.11)
RDW: 14.1 % (ref 11.5–14.6)
WBC: 5.7 10*3/uL (ref 4.5–10.5)

## 2013-04-01 NOTE — Patient Instructions (Signed)
Your physician recommends that you continue on your current medications as directed. Please refer to the Current Medication list given to you today.  Your physician has recommended that you have a defibrillator generator change out. Scheduled for 04/16/2013.  Your physician recommends that you have lab work today: CMET/CBCD/INR

## 2013-04-01 NOTE — Assessment & Plan Note (Signed)
.  no intercurrent afib

## 2013-04-01 NOTE — Assessment & Plan Note (Signed)
The patient's device was interrogated.  The information was reviewed. No changes were made in the programming.   We have reviewed the benefits and risks of generator replacement.  These include but are not limited to lead fracture and infection.  The patient understands, agrees and is willing to proceed.

## 2013-04-01 NOTE — Assessment & Plan Note (Signed)
euvolemic continue meds

## 2013-04-01 NOTE — Progress Notes (Signed)
      Patient Care Team: Elby Showers, MD as PCP - General (Internal Medicine)   HPI  Casey [name] is a 78 y.o. female seen in followup for CHF in the setting of Nonischemic cardiomyopathy for which she is s/p Medtronic CRT implant.  She underwent ICD implantation years ago with by the upgrade about 2005. In 2007 she had failure of her defibrillator lead resulting in inappropriate shocks and requiring lead revision.  She had appropriate therapy for ventricular tachycardia. Cycle length of the ventricular tachycardia was less than 300 ms  She has been feeling well and tolerating her amiodarone.      Her device has reached ERI  Past Medical History  Diagnosis Date  . Cardiomyopathy   . CHF (congestive heart failure)   . Hypertension   . Biventricular ICD (implantable cardiac defibrillator) in place     medtronic     No past surgical history on file.  Current Outpatient Prescriptions  Medication Sig Dispense Refill  . albuterol (PROVENTIL HFA;VENTOLIN HFA) 108 (90 BASE) MCG/ACT inhaler Inhale 2 puffs into the lungs every 6 (six) hours as needed for wheezing.  1 Inhaler  2  . allopurinol (ZYLOPRIM) 100 MG tablet TAKE ONE TABLET BY MOUTH ONE TIME DAILY.  30 tablet  11  . amiodarone (PACERONE) 200 MG tablet Take 200 mg by mouth daily.      . brinzolamide (AZOPT) 1 % ophthalmic suspension Place 1 drop into the right eye 2 (two) times daily.      . cholecalciferol (VITAMIN D) 1000 UNITS tablet Take 800 Units by mouth daily.       . furosemide (LASIX) 40 MG tablet Take 40 mg by mouth daily.        Marland Kitchen levothyroxine (SYNTHROID, LEVOTHROID) 50 MCG tablet Take 50 mcg by mouth daily before breakfast.      . lisinopril (PRINIVIL,ZESTRIL) 20 MG tablet Take 20 mg by mouth 2 (two) times daily.        . metoprolol (LOPRESSOR) 50 MG tablet Take 25 mg by mouth 2 (two) times daily.       . Multiple Vitamins-Calcium (ONE-A-DAY WOMENS FORMULA) TABS Take 1 tablet by mouth daily.        .  potassium chloride SA (K-DUR,KLOR-CON) 20 MEQ tablet Take 20 mEq by mouth daily.        Marland Kitchen warfarin (COUMADIN) 4 MG tablet Take 4 mg by mouth daily.        No current facility-administered medications for this visit.    Allergies  Allergen Reactions  . Codeine     Unknown    Review of Systems negative except from HPI and PMH  Physical Exam BP 141/90  Pulse 77  Ht 5\' 2"  (1.575 m)  Wt 174 lb 1.9 oz (78.98 kg)  BMI 31.84 kg/m2 Well developed and well nourished in no acute distress HENT normal E scleral and icterus clear Neck Supple JVP flat; carotids brisk and full Clear to ausculation Device pocket well healed; without hematoma or erythema.  There is no tethering  Regular rate and rhythm, 2 / 6 murmurs gallops or rub Soft with active bowel sounds No clubbing cyanosis noEdema Alert and oriented, grossly normal motor and sensory function Skin Warm and Dry  ECG demonstrates P. synchronous pacing QRS is negative in lead V1  Chest x-ray demonstrates an anterior location  Assessment and  Plan

## 2013-04-01 NOTE — Assessment & Plan Note (Addendum)
No intercurrent sustained VT ; only nonsustained amio labs per Dr Judith Part  is a TSH was drawn in July. We'll check  LFTs as part of preoperative labs

## 2013-04-08 ENCOUNTER — Encounter: Payer: Self-pay | Admitting: Internal Medicine

## 2013-04-10 ENCOUNTER — Encounter (HOSPITAL_COMMUNITY): Payer: Self-pay

## 2013-04-15 MED ORDER — GENTAMICIN SULFATE 40 MG/ML IJ SOLN
80.0000 mg | INTRAMUSCULAR | Status: DC
  Filled 2013-04-15: qty 2

## 2013-04-15 MED ORDER — CEFAZOLIN SODIUM-DEXTROSE 2-3 GM-% IV SOLR: 2.0000 g | INTRAVENOUS | Status: DC

## 2013-04-15 MED ORDER — CEFAZOLIN SODIUM-DEXTROSE 2-3 GM-% IV SOLR
2.0000 g | INTRAVENOUS | Status: DC
  Filled 2013-04-15: qty 50

## 2013-04-16 ENCOUNTER — Encounter (HOSPITAL_COMMUNITY)
Admission: RE | Disposition: A | Discharge status: Home / Self Care | Payer: Self-pay | Source: Ambulatory Visit | Attending: Internal Medicine

## 2013-04-16 ENCOUNTER — Encounter (HOSPITAL_COMMUNITY): Payer: Self-pay | Admitting: Internal Medicine

## 2013-04-16 ENCOUNTER — Ambulatory Visit (HOSPITAL_COMMUNITY)
Admission: RE | Admit: 2013-04-16 | Discharge: 2013-04-16 | Disposition: A | Discharge status: Home / Self Care | Payer: Medicare Other | Source: Ambulatory Visit | Attending: Internal Medicine | Admitting: Internal Medicine

## 2013-04-16 DIAGNOSIS — Z4502 Encounter for adjustment and management of automatic implantable cardiac defibrillator: Secondary | ICD-10-CM | POA: Insufficient documentation

## 2013-04-16 DIAGNOSIS — I509 Heart failure, unspecified: Secondary | ICD-10-CM | POA: Diagnosis not present

## 2013-04-16 DIAGNOSIS — Z95810 Presence of automatic (implantable) cardiac defibrillator: Secondary | ICD-10-CM

## 2013-04-16 DIAGNOSIS — I472 Ventricular tachycardia, unspecified: Secondary | ICD-10-CM

## 2013-04-16 DIAGNOSIS — I10 Essential (primary) hypertension: Secondary | ICD-10-CM | POA: Insufficient documentation

## 2013-04-16 DIAGNOSIS — I4891 Unspecified atrial fibrillation: Secondary | ICD-10-CM

## 2013-04-16 DIAGNOSIS — I428 Other cardiomyopathies: Secondary | ICD-10-CM

## 2013-04-16 DIAGNOSIS — I5022 Chronic systolic (congestive) heart failure: Secondary | ICD-10-CM

## 2013-04-16 HISTORY — DX: Ventricular tachycardia: I47.2

## 2013-04-16 HISTORY — DX: Other cardiomyopathies: I42.8

## 2013-04-16 HISTORY — PX: BIV ICD GENERTAOR CHANGE OUT: SHX5745

## 2013-04-16 HISTORY — DX: Ventricular tachycardia, unspecified: I47.20

## 2013-04-16 HISTORY — DX: Chronic systolic (congestive) heart failure: I50.22

## 2013-04-16 LAB — SURGICAL PCR SCREEN: Staphylococcus aureus: POSITIVE — AB

## 2013-04-16 LAB — GLUCOSE, CAPILLARY: Glucose-Capillary: 131 mg/dL — ABNORMAL HIGH (ref 70–99)

## 2013-04-16 LAB — PROTIME-INR: Prothrombin Time: 25.3 seconds — ABNORMAL HIGH (ref 11.6–15.2)

## 2013-04-16 SURGERY — BIV ICD GENERTAOR CHANGE OUT
Anesthesia: LOCAL

## 2013-04-16 MED ORDER — MUPIROCIN 2 % EX OINT
TOPICAL_OINTMENT | Freq: Two times a day (BID) | CUTANEOUS | Status: DC
  Administered 2013-04-16: 1 via NASAL
  Filled 2013-04-16: qty 22

## 2013-04-16 MED ORDER — SODIUM CHLORIDE 0.9 % IV SOLN: INTRAVENOUS | Status: AC

## 2013-04-16 MED ORDER — MUPIROCIN 2 % EX OINT
TOPICAL_OINTMENT | CUTANEOUS | Status: AC
  Filled 2013-04-16: qty 22

## 2013-04-16 MED ORDER — ACETAMINOPHEN 325 MG PO TABS
325.0000 mg | ORAL_TABLET | ORAL | Status: DC | PRN
  Filled 2013-04-16: qty 2

## 2013-04-16 MED ORDER — SODIUM CHLORIDE 0.9 % IV SOLN
INTRAVENOUS | Status: DC
  Administered 2013-04-16: 09:00:00 via INTRAVENOUS

## 2013-04-16 MED ORDER — FENTANYL CITRATE 0.05 MG/ML IJ SOLN
INTRAMUSCULAR | Status: AC
  Filled 2013-04-16: qty 2

## 2013-04-16 MED ORDER — MIDAZOLAM HCL 5 MG/5ML IJ SOLN
INTRAMUSCULAR | Status: AC
  Filled 2013-04-16: qty 5

## 2013-04-16 MED ORDER — ONDANSETRON HCL 4 MG/2ML IJ SOLN: 4.0000 mg | Freq: Four times a day (QID) | INTRAMUSCULAR | Status: DC | PRN

## 2013-04-16 MED ORDER — CHLORHEXIDINE GLUCONATE 4 % EX LIQD: 60.0000 mL | Freq: Once | CUTANEOUS | Status: DC

## 2013-04-16 MED ORDER — MUPIROCIN 2 % EX OINT: TOPICAL_OINTMENT | Freq: Two times a day (BID) | CUTANEOUS | Status: DC

## 2013-04-16 MED ORDER — CHLORHEXIDINE GLUCONATE 4 % EX LIQD
60.0000 mL | Freq: Once | CUTANEOUS | Status: DC
  Filled 2013-04-16: qty 60

## 2013-04-16 NOTE — Progress Notes (Signed)
Discharge instruction given per MD order.  Pt and Cg was able to understand instructions.  PT to car via Wheelchair.

## 2013-04-16 NOTE — CV Procedure (Signed)
Preoperative diagnosis nicm CHF previous CRT-D now ate ERI  Previous surgeryX4 Postoperative diagnosis same/   Procedure: Generator replacement  /pocket revision/  Antimicrobial   Following informed consent the patient was brought to the electrophysiology laboratory in place of the fluoroscopic table in the supine position after routine prep and drape lidocaine was infiltrated in the region of the previous incision and carried down to later the device pocket using sharp dissection and electrocautery. The pocket was opened the device was freed up and was explanted.  Interrogation of the previously implanted ICD ventricular lead Medtronic  demonstrated an R wave of 8.3  millivolts., and impedance of 460 ohms, and a pacing threshold of 0.4 volts at 0.5 msec.   pacing impedances through the high voltage coils >>462/346    The previously implanted atrial lead Medtronic  demonstrated a P-wave amplitude of 3.6 milllivolts  and impedance of  473 ohms, and a pacing threshold of 0.8volts at @ 0.4milliseconds.  The leads were inspected. Repair was not  needed. The leads were then attached to a Medtronic  pulse generator, serial number [id number].    Through the device the P-wave amplitude  Was  3.0 milllivolts and impedance of  418 ohms, and a pacing threshold of 1.0 volts at  @ 0.32milliseconds; the ICD ventricular lead demonstrated an R wave of 9.3  millivolts., and impedance of 399 ohms, and a pacing threshold of 0.5 volts at 0.4 msec  High voltage impedances were 49/40 ohms  An Aegis pouch was used as this was her fifth device procedure  The pocket was irrigated with antibiotic containing saline solution hemostasis was assured and the leads and the device were placed in the pocket. The wound was then closed in 3 layers in normal fashion.  The patient tolerated the procedure without apparent complication.  DFT testing was not performed  [facility] [place]   \

## 2013-04-16 NOTE — Interval H&P Note (Signed)
History and Physical Interval Note:  04/16/2013 9:59 AM  Casey [name]  has presented today for surgery, with the diagnosis of cm  The various methods of treatment have been discussed with the patient and family. After consideration of risks, benefits and other options for treatment, the patient has consented to  Procedure(s): BIV ICD [place] (N/A) as a surgical intervention .  The patient's history has been reviewed, patient examined, no change in status, stable for surgery.  I have reviewed the patient's chart and labs.  Questions were answered to the patient's satisfaction.     [place] ICD Criteria  Current LVEF (within 6 months):20%  NYHA Functional Classification: Class III  Heart Failure History:  Yes, Duration of heart failure since onset is > 9 months  Non-Ischemic Dilated Cardiomyopathy History:  No.  Atrial Fibrillation/Atrial Flutter:  No.  Ventricular Tachycardia History:  No.  Cardiac Arrest History:  No  History of Syndromes with Risk of Sudden Death:  No.  Previous ICD:  Yes, ICD Type:  CRT-D, Reason for ICD:  Primary prevention.  LVEF is not available  Electrophysiology Study: No.  Anticoagulation Therapy:  Patient is NOT on anticoagulation therapy.   Beta Blocker Therapy:  Yes.   Ace Inhibitor/ARB Therapy:  Yes.

## 2013-04-16 NOTE — H&P (View-Only) (Signed)
      Patient Care Team: Elby Showers, MD as PCP - General (Internal Medicine)   HPI  Casey [name] is a 78 y.o. female seen in followup for CHF in the setting of Nonischemic cardiomyopathy for which she is s/p Medtronic CRT implant.  She underwent ICD implantation years ago with by the upgrade about 2005. In 2007 she had failure of her defibrillator lead resulting in inappropriate shocks and requiring lead revision.  She had appropriate therapy for ventricular tachycardia. Cycle length of the ventricular tachycardia was less than 300 ms  She has been feeling well and tolerating her amiodarone.      Her device has reached ERI  Past Medical History  Diagnosis Date  . Cardiomyopathy   . CHF (congestive heart failure)   . Hypertension   . Biventricular ICD (implantable cardiac defibrillator) in place     medtronic     No past surgical history on file.  Current Outpatient Prescriptions  Medication Sig Dispense Refill  . albuterol (PROVENTIL HFA;VENTOLIN HFA) 108 (90 BASE) MCG/ACT inhaler Inhale 2 puffs into the lungs every 6 (six) hours as needed for wheezing.  1 Inhaler  2  . allopurinol (ZYLOPRIM) 100 MG tablet TAKE ONE TABLET BY MOUTH ONE TIME DAILY.  30 tablet  11  . amiodarone (PACERONE) 200 MG tablet Take 200 mg by mouth daily.      . brinzolamide (AZOPT) 1 % ophthalmic suspension Place 1 drop into the right eye 2 (two) times daily.      . cholecalciferol (VITAMIN D) 1000 UNITS tablet Take 800 Units by mouth daily.       . furosemide (LASIX) 40 MG tablet Take 40 mg by mouth daily.        Marland Kitchen levothyroxine (SYNTHROID, LEVOTHROID) 50 MCG tablet Take 50 mcg by mouth daily before breakfast.      . lisinopril (PRINIVIL,ZESTRIL) 20 MG tablet Take 20 mg by mouth 2 (two) times daily.        . metoprolol (LOPRESSOR) 50 MG tablet Take 25 mg by mouth 2 (two) times daily.       . Multiple Vitamins-Calcium (ONE-A-DAY WOMENS FORMULA) TABS Take 1 tablet by mouth daily.        .  potassium chloride SA (K-DUR,KLOR-CON) 20 MEQ tablet Take 20 mEq by mouth daily.        Marland Kitchen warfarin (COUMADIN) 4 MG tablet Take 4 mg by mouth daily.        No current facility-administered medications for this visit.    Allergies  Allergen Reactions  . Codeine     Unknown    Review of Systems negative except from HPI and PMH  Physical Exam BP 141/90  Pulse 77  Ht 5\' 2"  (1.575 m)  Wt 174 lb 1.9 oz (78.98 kg)  BMI 31.84 kg/m2 Well developed and well nourished in no acute distress HENT normal E scleral and icterus clear Neck Supple JVP flat; carotids brisk and full Clear to ausculation Device pocket well healed; without hematoma or erythema.  There is no tethering  Regular rate and rhythm, 2 / 6 murmurs gallops or rub Soft with active bowel sounds No clubbing cyanosis noEdema Alert and oriented, grossly normal motor and sensory function Skin Warm and Dry  ECG demonstrates P. synchronous pacing QRS is negative in lead V1  Chest x-ray demonstrates an anterior location  Assessment and  Plan

## 2013-04-16 NOTE — Progress Notes (Signed)
Pt received from EP lab procedure alert and  Able to tolerated fluid and food.  Pt denies any discomfort at this time.  Family at bedside.

## 2013-04-28 ENCOUNTER — Ambulatory Visit (INDEPENDENT_AMBULATORY_CARE_PROVIDER_SITE_OTHER): Payer: Medicare Other | Admitting: *Deleted

## 2013-04-28 ENCOUNTER — Encounter (INDEPENDENT_AMBULATORY_CARE_PROVIDER_SITE_OTHER): Payer: Self-pay

## 2013-04-28 DIAGNOSIS — I5022 Chronic systolic (congestive) heart failure: Secondary | ICD-10-CM

## 2013-04-28 DIAGNOSIS — I509 Heart failure, unspecified: Secondary | ICD-10-CM

## 2013-04-28 DIAGNOSIS — I428 Other cardiomyopathies: Secondary | ICD-10-CM

## 2013-04-28 LAB — MDC_IDC_ENUM_SESS_TYPE_INCLINIC
Battery Voltage: 3.15 V
Brady Statistic AP VP Percent: 2.19 %
Brady Statistic AS VP Percent: 96.27 %
Brady Statistic RA Percent Paced: 2.26 %
Brady Statistic RV Percent Paced: 0.25 %
Date Time Interrogation Session: 20141117111430
HighPow Impedance: 228 Ohm
HighPow Impedance: 36 Ohm
HighPow Impedance: 47 Ohm
Lead Channel Impedance Value: 4047 Ohm
Lead Channel Impedance Value: 4047 Ohm
Lead Channel Impedance Value: 456 Ohm
Lead Channel Pacing Threshold Pulse Width: 0.4 ms
Lead Channel Sensing Intrinsic Amplitude: 2.875 mV
Lead Channel Sensing Intrinsic Amplitude: 8 mV
Lead Channel Setting Pacing Amplitude: 2 V
Lead Channel Setting Pacing Amplitude: 2.5 V
Lead Channel Setting Pacing Pulse Width: 0.4 ms
Lead Channel Setting Pacing Pulse Width: 0.4 ms
Lead Channel Setting Sensing Sensitivity: 0.3 mV
Zone Setting Detection Interval: 300 ms
Zone Setting Detection Interval: 350 ms
Zone Setting Detection Interval: 430 ms
Zone Setting Detection Interval: 540 ms

## 2013-04-28 NOTE — Progress Notes (Signed)
Wound check appointment. Steri-strips removed. Wound without redness or edema. Incision edges approximated, wound well healed. Normal device function. Thresholds, sensing, and impedances consistent with implant measurements. Device programmed at 3.5V for extra safety margin until 3 month visit. Histogram distribution appropriate for patient and level of activity. No mode switches or ventricular arrhythmias noted. Patient educated about wound care, arm mobility, lifting restrictions, shock plan.  [facility] 07/30/13.

## 2013-04-29 ENCOUNTER — Other Ambulatory Visit: Payer: Medicare Other | Admitting: Internal Medicine

## 2013-04-29 DIAGNOSIS — E119 Type 2 diabetes mellitus without complications: Secondary | ICD-10-CM | POA: Diagnosis not present

## 2013-04-29 DIAGNOSIS — I119 Hypertensive heart disease without heart failure: Secondary | ICD-10-CM

## 2013-04-29 DIAGNOSIS — E039 Hypothyroidism, unspecified: Secondary | ICD-10-CM | POA: Diagnosis not present

## 2013-04-29 DIAGNOSIS — E559 Vitamin D deficiency, unspecified: Secondary | ICD-10-CM | POA: Diagnosis not present

## 2013-04-29 DIAGNOSIS — Z130 Encounter for screening for diseases of the blood and blood-forming organs and certain disorders involving the immune mechanism: Secondary | ICD-10-CM | POA: Diagnosis not present

## 2013-04-29 DIAGNOSIS — E785 Hyperlipidemia, unspecified: Secondary | ICD-10-CM | POA: Diagnosis not present

## 2013-04-29 LAB — COMPREHENSIVE METABOLIC PANEL
Albumin: 4.1 g/dL (ref 3.5–5.2)
BUN: 24 mg/dL — ABNORMAL HIGH (ref 6–23)
CO2: 27 mEq/L (ref 19–32)
Chloride: 104 mEq/L (ref 96–112)
Creat: 1.23 mg/dL — ABNORMAL HIGH (ref 0.50–1.10)
Glucose, Bld: 123 mg/dL — ABNORMAL HIGH (ref 70–99)
Sodium: 140 mEq/L (ref 135–145)
Total Bilirubin: 1.2 mg/dL (ref 0.3–1.2)
Total Protein: 6.4 g/dL (ref 6.0–8.3)

## 2013-04-29 LAB — HEMOGLOBIN A1C: Mean Plasma Glucose: 126 mg/dL — ABNORMAL HIGH (ref <117)

## 2013-04-29 LAB — TSH: TSH: 3.891 u[IU]/mL (ref 0.350–4.500)

## 2013-04-29 LAB — CBC WITH DIFFERENTIAL/PLATELET
Basophils Relative: 1 % (ref 0–1)
Eosinophils Absolute: 0.1 10*3/uL (ref 0.0–0.7)
Eosinophils Relative: 2 % (ref 0–5)
HCT: 44.6 % (ref 36.0–46.0)
Hemoglobin: 15.4 g/dL — ABNORMAL HIGH (ref 12.0–15.0)
Lymphs Abs: 1.5 10*3/uL (ref 0.7–4.0)
MCH: 33.8 pg (ref 26.0–34.0)
MCHC: 34.5 g/dL (ref 30.0–36.0)
Monocytes Absolute: 0.6 10*3/uL (ref 0.1–1.0)
Monocytes Relative: 10 % (ref 3–12)
Neutrophils Relative %: 60 % (ref 43–77)

## 2013-04-29 LAB — LIPID PANEL
Cholesterol: 255 mg/dL — ABNORMAL HIGH (ref 0–200)
HDL: 64 mg/dL
LDL Cholesterol: 173 mg/dL — ABNORMAL HIGH (ref 0–99)
Total CHOL/HDL Ratio: 4 ratio
Triglycerides: 89 mg/dL
VLDL: 18 mg/dL (ref 0–40)

## 2013-05-01 ENCOUNTER — Encounter: Payer: Self-pay | Admitting: Internal Medicine

## 2013-05-01 ENCOUNTER — Ambulatory Visit (INDEPENDENT_AMBULATORY_CARE_PROVIDER_SITE_OTHER): Payer: Medicare Other | Admitting: Internal Medicine

## 2013-05-01 VITALS — BP 154/102 | HR 72 | Temp 97.3°F | Ht 62.5 in | Wt 175.0 lb

## 2013-05-01 DIAGNOSIS — Z0000 Encounter for general adult medical examination without abnormal findings: Secondary | ICD-10-CM | POA: Diagnosis not present

## 2013-05-01 DIAGNOSIS — E119 Type 2 diabetes mellitus without complications: Secondary | ICD-10-CM | POA: Diagnosis not present

## 2013-05-01 DIAGNOSIS — I428 Other cardiomyopathies: Secondary | ICD-10-CM

## 2013-05-01 DIAGNOSIS — E785 Hyperlipidemia, unspecified: Secondary | ICD-10-CM

## 2013-05-01 DIAGNOSIS — R030 Elevated blood-pressure reading, without diagnosis of hypertension: Secondary | ICD-10-CM

## 2013-05-01 DIAGNOSIS — I10 Essential (primary) hypertension: Secondary | ICD-10-CM

## 2013-05-02 LAB — MICROALBUMIN, URINE: Microalb, Ur: 54.46 mg/dL — ABNORMAL HIGH (ref 0.00–1.89)

## 2013-05-05 DIAGNOSIS — Z95810 Presence of automatic (implantable) cardiac defibrillator: Secondary | ICD-10-CM | POA: Diagnosis not present

## 2013-05-05 DIAGNOSIS — E039 Hypothyroidism, unspecified: Secondary | ICD-10-CM | POA: Diagnosis not present

## 2013-05-05 DIAGNOSIS — Z7901 Long term (current) use of anticoagulants: Secondary | ICD-10-CM | POA: Diagnosis not present

## 2013-05-19 DIAGNOSIS — Z95810 Presence of automatic (implantable) cardiac defibrillator: Secondary | ICD-10-CM | POA: Diagnosis not present

## 2013-05-19 DIAGNOSIS — Z7901 Long term (current) use of anticoagulants: Secondary | ICD-10-CM | POA: Diagnosis not present

## 2013-05-19 DIAGNOSIS — E039 Hypothyroidism, unspecified: Secondary | ICD-10-CM | POA: Diagnosis not present

## 2013-05-20 DIAGNOSIS — H4011X0 Primary open-angle glaucoma, stage unspecified: Secondary | ICD-10-CM | POA: Diagnosis not present

## 2013-05-22 ENCOUNTER — Encounter: Payer: Self-pay | Admitting: Internal Medicine

## 2013-06-03 DIAGNOSIS — Z7901 Long term (current) use of anticoagulants: Secondary | ICD-10-CM | POA: Diagnosis not present

## 2013-06-03 DIAGNOSIS — E039 Hypothyroidism, unspecified: Secondary | ICD-10-CM | POA: Diagnosis not present

## 2013-06-03 DIAGNOSIS — Z95810 Presence of automatic (implantable) cardiac defibrillator: Secondary | ICD-10-CM | POA: Diagnosis not present

## 2013-07-01 ENCOUNTER — Encounter: Payer: Self-pay | Admitting: Cardiology

## 2013-07-01 DIAGNOSIS — E669 Obesity, unspecified: Secondary | ICD-10-CM | POA: Diagnosis not present

## 2013-07-01 DIAGNOSIS — I428 Other cardiomyopathies: Secondary | ICD-10-CM | POA: Diagnosis not present

## 2013-07-01 DIAGNOSIS — I4729 Other ventricular tachycardia: Secondary | ICD-10-CM | POA: Diagnosis not present

## 2013-07-01 DIAGNOSIS — Z95810 Presence of automatic (implantable) cardiac defibrillator: Secondary | ICD-10-CM | POA: Diagnosis not present

## 2013-07-01 DIAGNOSIS — E039 Hypothyroidism, unspecified: Secondary | ICD-10-CM | POA: Diagnosis not present

## 2013-07-01 DIAGNOSIS — R0609 Other forms of dyspnea: Secondary | ICD-10-CM | POA: Diagnosis not present

## 2013-07-01 DIAGNOSIS — I501 Left ventricular failure: Secondary | ICD-10-CM | POA: Diagnosis not present

## 2013-07-01 DIAGNOSIS — I472 Ventricular tachycardia: Secondary | ICD-10-CM | POA: Diagnosis not present

## 2013-07-01 DIAGNOSIS — Z7901 Long term (current) use of anticoagulants: Secondary | ICD-10-CM | POA: Diagnosis not present

## 2013-07-01 DIAGNOSIS — R0989 Other specified symptoms and signs involving the circulatory and respiratory systems: Secondary | ICD-10-CM | POA: Diagnosis not present

## 2013-07-01 NOTE — Progress Notes (Unsigned)
Patient ID: Casey [name], female   DOB: 1934/09/21, 79 y.o.   MRN: [id number]  Casey [name], Casey [name]  Date of visit:  07/01/2013 DOB:  Nov 20, 1934    Age:  79 yrs. Medical record number:  [id number]     Account number:  [id number] Primary Care Provider: [facility] ____________________________ CURRENT DIAGNOSES  1. Cardiomyopathy Idiopathic  2. Congestive Heart Failure Left  3. Dyspnea  4. Ventricular tachycardia  5. Obesity(BMI30-40)  6. Long Term Use Anticoagulant  7. Hypothyroidism  8. Interventricular Septal Defect  9. AICD in situ  10. Congestive Heart Failure ____________________________ ALLERGIES  Candesartan, Severe nausea & vomiting  Codeine, Rash  Hydralazine, Lethargy  Isosorbide, Lethargy ____________________________ MEDICATIONS  1. Vitamin D3 400 unit Capsule, 1 p.o. daily  2. allopurinol 100 mg tablet, 1 p.o. daily  3. Azopt 1 % drops,suspension, 1 gtt od bid  4. Simbrinza 1-0.2 % drops,suspension, 1 gtt od bid  5. multivitamin tablet, 1 p.o. daily  6. levothyroxine 50 mcg tablet, 1 p.o. daily  7. warfarin 3 mg tablet, Daily or as directed  8. Klor-Con M20 mEq tablet,extended release, 1 p.o. q.d.  9. lisinopril 20 mg tablet, BID  10. metoprolol tartrate 50 mg tablet, 1/2 tab b.i.d.  11. amiodarone 200 mg tablet, 1 p.o. daily  12. Proventil HFA 90 mcg/actuation aerosol inhaler, PRN  13. furosemide 40 mg tablet, BID ____________________________ CHIEF COMPLAINTS  Ankle edema  Dyspnea ____________________________ HISTORY OF PRESENT ILLNESS  Patient seen for cardiac followup. Since she was previously here she had a defibrillator battery change out by Dr. Caryl Comes. Since then however she has noted some worsening dyspnea on exertion as well as edema and weight gain. She is having mild edema. She is drinking a lot of water. She has had no defibrillator discharges. She has not had salt excess or other changes in her medicine. She did say that she had had a chest  x-ray a while back but it looks like it has been since September that she had this done. She complains of situational stress at home with her husband. ____________________________ PAST HISTORY  Past Medical Illnesses:  history of peptic ulcer disease, lumbar disc disease, left arm  DVT 12/07, DM-diet controlled, gout, hypothyroidism;  Cardiovascular Illnesses:  cardiomyopathy(idiopathic), CHF, insertion of biventricular defibrillator 6/05, ventricular tachycardia;  Surgical Procedures:  cesarean section, hysterectomy, pin rt femur, Medtronic AICD implant June 2005;  Cardiology Procedures-Invasive:  cardiac cath (left) May 2005, replacement of fractured defibrillator lead 11/07, Medtronic generator change July 2009, Medtronic generator change November 2014;  Cardiology Procedures-Noninvasive:  echocardiogram November 2009, echocardiogram June 2011, echocardiogram June 2013;  Cardiac Cath Results:  normal coronary arteries, global hypokinesis;  LVEF of 20% documented via echocardiogram on 11/12/2011,   ____________________________ CARDIO-PULMONARY TEST DATES EKG Date:  07/01/2013;   Cardiac Cath Date:  11/02/2003;  Echocardiography Date: 11/22/2011;  Chest Xray Date: 05/18/2012;   ____________________________ FAMILY HISTORY Father -- Father dead, Ischemic heart disease at greater than 46 years Mother -- Mother dead, Myocardial infarction at greater than 68 ____________________________ SOCIAL HISTORY Alcohol Use:  no alcohol use;  Smoking:  used to smoke but quit;  Diet:  regular diet;  Lifestyle:  married, 1 daughter and 1 son;  Exercise:  no regular exercise;  Occupation:  retired and Surveyor, [employer];  Residence:  lives with husband;   ____________________________ REVIEW OF SYSTEMS General:  weight loss of approximately 5 lbs Eyes: wears eye glasses/contact lenses, decreased acuity O.D. Respiratory: denies dyspnea, cough, wheezing or hemoptysis.  Cardiovascular:  please review HPI Abdominal: denies  dyspepsia, GI bleeding, constipation, or diarrheaGenitourinary-Female: no dysuria, urgency, frequency, UTIs, or stress incontinence Musculoskeletal:  chronic low back pain Psychiatric:  situational stress  ____________________________ PHYSICAL EXAMINATION VITAL SIGNS  Blood Pressure:  110/74 Sitting, Right arm, regular cuff  , 136/70 Standing, Right arm and regular cuff   Pulse:  88/min. Weight:  177.50 lbs. Height:  62"BMI: 32  Constitutional:  pleasant African American female in no acute distress, mildly obese Skin:  warm and dry to touch, no apparent skin lesions, or masses noted. Head:  normocephalic, normal hair pattern, no masses or tenderness Eyes:  EOMS Intact, PERRLA, C and S clear, Funduscopic exam not done. ENT:  ears, nose and throat reveal no gross abnormalities.  Dentition good. Neck:  supple, no masses, thyromegaly, JVD. Carotid pulses are full and equal bilaterally without bruits. Chest:  clear to auscultation, normal A-P diameter, healed ICD incision in the left pectoral area Cardiac:  Normal  S1,S2,, regular rhythm, grade 2/6 early systolic murmur left sternal border, soft S3 Abdomen:  abdomen soft,non-tender, no masses, no hepatospenomegaly, or aneurysm noted Peripheral Pulses:  pulses full and equal in all extremities Extremities & Back:  2+ edema Neurological:  no gross motor or sensory deficits noted, affect appropriate, oriented x3. ____________________________ MOST RECENT LIPID PANEL 04/29/13  CHOL TOTL 255 mg/dl, LDL 173 NM, HDL 64 mg/dl, TRIGLYCER 89 mg/dl, ALT 45 u/l, ALK PHOS 132 u/l, CHOL/HDL 4.0 (Calc) and AST 33 u/l ____________________________ IMPRESSIONS/PLAN  1. Worsening dyspnea on exertion with weight gain and peripheral edema suggestive of decompensated heart failure 2. Recent defibrillator battery change 3. Situational stress  Recommendations:  Obtain lab work as noted below. I will also like for her to have a repeat echocardiogram as well as a  chest x-ray. I increased her furosemide to 40 mg twice daily. She is to restrict her fluids to 2 quarts daily. Her EKG shows sinus rhythm with a wide complex paced beat. She may need to have a further look at her pacing leads and defibrillator settings. Followup in 2 weeks. ____________________________ TODAYS ORDERS  1. Coag Clinic Visit: Coag OV 2 weeks  2. 12 Lead EKG: Today  3. Comprehensive Metabolic Panel: Today  4. Complete Blood Count: Today  5. BNP: Today  6. TSH: Today  7. Return Visit: 2 weeks   8. 2D, color flow, doppler: First Available  9. Chest X-ray PA/Lat: today                       ____________________________ Cardiology Physician:  Kerry Hough MD [facility]

## 2013-07-09 ENCOUNTER — Other Ambulatory Visit: Payer: Self-pay | Admitting: Cardiology

## 2013-07-09 ENCOUNTER — Ambulatory Visit
Admission: RE | Admit: 2013-07-09 | Discharge: 2013-07-09 | Disposition: A | Discharge status: Home / Self Care | Payer: Medicare Other | Source: Ambulatory Visit | Attending: Cardiology | Admitting: Cardiology

## 2013-07-09 DIAGNOSIS — I5022 Chronic systolic (congestive) heart failure: Secondary | ICD-10-CM

## 2013-07-09 DIAGNOSIS — R0602 Shortness of breath: Secondary | ICD-10-CM | POA: Diagnosis not present

## 2013-07-09 DIAGNOSIS — R05 Cough: Secondary | ICD-10-CM | POA: Diagnosis not present

## 2013-07-09 DIAGNOSIS — R059 Cough, unspecified: Secondary | ICD-10-CM | POA: Diagnosis not present

## 2013-07-09 DIAGNOSIS — R0600 Dyspnea, unspecified: Secondary | ICD-10-CM

## 2013-07-14 DIAGNOSIS — I501 Left ventricular failure: Secondary | ICD-10-CM | POA: Diagnosis not present

## 2013-07-14 DIAGNOSIS — Z95810 Presence of automatic (implantable) cardiac defibrillator: Secondary | ICD-10-CM | POA: Diagnosis not present

## 2013-07-14 DIAGNOSIS — I428 Other cardiomyopathies: Secondary | ICD-10-CM | POA: Diagnosis not present

## 2013-07-14 DIAGNOSIS — E669 Obesity, unspecified: Secondary | ICD-10-CM | POA: Diagnosis not present

## 2013-07-14 DIAGNOSIS — I4729 Other ventricular tachycardia: Secondary | ICD-10-CM | POA: Diagnosis not present

## 2013-07-14 DIAGNOSIS — Z7901 Long term (current) use of anticoagulants: Secondary | ICD-10-CM | POA: Diagnosis not present

## 2013-07-14 DIAGNOSIS — E039 Hypothyroidism, unspecified: Secondary | ICD-10-CM | POA: Diagnosis not present

## 2013-07-14 DIAGNOSIS — R0609 Other forms of dyspnea: Secondary | ICD-10-CM | POA: Diagnosis not present

## 2013-07-14 DIAGNOSIS — I472 Ventricular tachycardia: Secondary | ICD-10-CM | POA: Diagnosis not present

## 2013-07-21 DIAGNOSIS — H4011X0 Primary open-angle glaucoma, stage unspecified: Secondary | ICD-10-CM | POA: Diagnosis not present

## 2013-07-30 ENCOUNTER — Encounter: Payer: Medicare Other | Admitting: *Deleted

## 2013-08-01 DIAGNOSIS — E039 Hypothyroidism, unspecified: Secondary | ICD-10-CM | POA: Diagnosis not present

## 2013-08-01 DIAGNOSIS — I472 Ventricular tachycardia: Secondary | ICD-10-CM | POA: Diagnosis not present

## 2013-08-01 DIAGNOSIS — Z7901 Long term (current) use of anticoagulants: Secondary | ICD-10-CM | POA: Diagnosis not present

## 2013-08-01 DIAGNOSIS — R0989 Other specified symptoms and signs involving the circulatory and respiratory systems: Secondary | ICD-10-CM | POA: Diagnosis not present

## 2013-08-01 DIAGNOSIS — Z95810 Presence of automatic (implantable) cardiac defibrillator: Secondary | ICD-10-CM | POA: Diagnosis not present

## 2013-08-01 DIAGNOSIS — R0609 Other forms of dyspnea: Secondary | ICD-10-CM | POA: Diagnosis not present

## 2013-08-01 DIAGNOSIS — I4729 Other ventricular tachycardia: Secondary | ICD-10-CM | POA: Diagnosis not present

## 2013-08-01 DIAGNOSIS — I428 Other cardiomyopathies: Secondary | ICD-10-CM | POA: Diagnosis not present

## 2013-08-01 DIAGNOSIS — I501 Left ventricular failure: Secondary | ICD-10-CM | POA: Diagnosis not present

## 2013-08-01 DIAGNOSIS — E669 Obesity, unspecified: Secondary | ICD-10-CM | POA: Diagnosis not present

## 2013-08-04 ENCOUNTER — Encounter: Payer: Self-pay | Admitting: *Deleted

## 2013-08-10 ENCOUNTER — Encounter: Payer: Self-pay | Admitting: Internal Medicine

## 2013-08-10 NOTE — Patient Instructions (Addendum)
Continue same medications and return in 6 months. Return in 3 Hemoccult cards. Watch blood pressure

## 2013-08-10 NOTE — Progress Notes (Signed)
Subjective:    Patient ID: Casey [name], female    DOB: May 19, 1935, 79 y.o.   MRN: [id number]  HPI 79 year old Black Female with history of cardiomyopathy with low ejection fraction, pacemaker, chronic Coumadin therapy, hypertension, hyperlipidemia glaucoma, type 2 diabetes mellitus in today for health maintenance and evaluation of medical issues. She is under the care of Dr. Wynonia Lawman and Dr. Caryl Comes, Cardiologists.  She had a hysterectomy with BSO in February 1990. She took estrogen replacement for a number of years. Had 2 cesarean sections.  History of peptic ulcer disease when she was 79 years old.  Last colonoscopy on file 1999.  History of spinal stenosis treated conservatively by Dr. Carloyn Manner many years ago but was hospitalized due to severe pain.  Right cataract extraction July 2009. History of ganglion cyst 2 cm in diameter aspirated from right leg 1997 by orthopedist.  Insertion of implantable defibrillator 2005. Had new defibrillator placed July 2009.  In December 2010, I incised and drained an infected epidermoid cyst.  She took Lexapro for a while during the time her mother was ill but has been able to stop that medication.  Tetanus vaccine March 2011. Pneumovax immunization March 2011. Gets annual influenza immunization.  Social history:   She is married. 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 Husband is a Art gallery manager but has retired. They have a son and a daughter. Patient does not smoke or consume alcohol.  Family history: Father died at age 44 with history of cancer of the esophagus. Mother with history of diabetes mellitus, CVA, hypertension, MI, dementia. One brother died of cause unknown-suspicious death but he had a history of heart problems. One sister living but not much contact with her.  Gets regular eye exams because of history of glaucoma.    Review of Systems  Cardiovascular:       Denies shortness of breath or chest pain  All other systems  reviewed and are negative.       Objective:   Physical Exam  Vitals reviewed. Constitutional: She is oriented to person, place, and time. She appears well-developed and well-nourished. No distress.  HENT:  Head: Normocephalic and atraumatic.  Right Ear: External ear normal.  Left Ear: External ear normal.  Mouth/Throat: Oropharynx is clear and moist. No oropharyngeal exudate.  Eyes: Conjunctivae are normal. Pupils are equal, round, and reactive to light. Right eye exhibits no discharge. Left eye exhibits no discharge. No scleral icterus.  Neck: Neck supple. No JVD present. No thyromegaly present.  Cardiovascular: Normal rate, regular rhythm and intact distal pulses.   Murmur heard. 3/6 holosystolic ejection murmur heard unchanged  Pulmonary/Chest: Effort normal and breath sounds normal. No respiratory distress. She has no wheezes. She has no rales. She exhibits no tenderness.  Breasts normal female  Abdominal: Soft. Bowel sounds are normal. She exhibits no distension and no mass. There is no tenderness. There is no rebound and no guarding.  Musculoskeletal: Normal range of motion. She exhibits no edema.  Lymphadenopathy:    She has no cervical adenopathy.  Neurological: She is alert and oriented to person, place, and time. She has normal reflexes. No cranial nerve deficit. Coordination normal.  Skin: Skin is warm and dry. Rash noted. She is not diaphoretic. No erythema. No pallor.  Psychiatric: Her behavior is normal. Judgment and thought content normal.          Assessment & Plan:  Type 2 diabetes mellitus  Hyperlipidemia  Vitamin D deficiency  Essential  hypertension  Glaucoma  Anxiety  History of lumbar spinal stenosis  History of ventricular septal defect  History of implantable cardiac defibrillator  Chronic anticoagulation therapy  Elevated blood pressure-patient is not certain why blood pressure is elevated today. She watches it at home. She will continue  to take it and let me know if it is persistently elevated    Plan: Continue same medications and return in 6 months. Watch blood pressure. Subjective:   Patient presents for Medicare Annual/Subsequent preventive examination.   Review Past Medical/Family/Social: See above   Risk Factors  Current exercise habits:  Dietary issues discussed:   Cardiac risk factors: Obesity (BMI >= 30 kg/m2).   Depression Screen  (Note: if answer to either of the following is "Yes", a more complete depression screening is indicated)  Over the past two weeks, have you felt down, depressed or hopeless? Yes  Over the past two weeks, have you felt little interest or pleasure in doing things? Yes  Have you lost interest or pleasure in daily life? Yes  Do you often feel hopeless? Yes  Do you cry easily over simple problems? No   Activities of Daily Living  In your present state of health, do you have any difficulty performing the following activities?:  Driving? No  Managing money? No  Feeding yourself? No  Getting from bed to chair? No  Climbing a flight of stairs? No  Preparing food and eating?: No  Bathing or showering? No  Getting dressed: No  Getting to the toilet? No  Using the toilet:No  Moving around from place to place: No  In the past year have you fallen or had a near fall?:No  Are you sexually active? No  Do you have more than one partner? No   Hearing Difficulties: No  Do you often ask people to speak up or repeat themselves? No  Do you experience ringing or noises in your ears? Yes  Do you have difficulty understanding soft or whispered voices? No  Do you feel that you have a problem with memory? Yes  Do you often misplace items? No  Do you feel safe at home? Yes   Cognitive Testing  Alert? Yes Normal Appearance?Yes  Oriented to person? Yes Place? Yes  Time? Yes  Recall of three objects? Yes  Can perform simple calculations? Yes  Displays appropriate judgment?Yes  Can read  the correct time from a watch face?Yes   List the Names of Other Physician/Practitioners you currently use:  Dr. Wynonia Lawman, Dr. Caryl Comes, eye physician for glaucoma  Indicate any recent Medical Services you may have received from other than [facility] providers in the past year (date may be approximate).   Screening Tests / Date see Epic Colonoscopy                     Zostavax  Mammogram  Influenza Vaccine  Tetanus/tdap   Objective:       General appearance: Appears stated age  Head: Normocephalic, without obvious abnormality, atraumatic  Eyes: conj clear, EOMi PEERLA  Ears: normal TM's and external ear canals both ears  Nose: Nares normal. Septum midline. Mucosa normal. No drainage or sinus tenderness.  Throat: lips, mucosa, and tongue normal; teeth and gums normal  Neck: no adenopathy, no carotid bruit, no JVD, supple, symmetrical, trachea midline and thyroid not enlarged, symmetric, no tenderness/mass/nodules  No CVA tenderness.  Lungs: clear to auscultation bilaterally  Breasts: normal appearance, no masses or tenderness,  pacemaker on  chest.  Heart: regular rate and rhythm, murmur unchanged Abdomen: soft, non-tender; bowel sounds normal; no masses, no organomegaly  Musculoskeletal: ROM normal in all joints, no crepitus, no deformity, Normal muscle strengthen. Back  is symmetric, no curvature. Skin: Skin color, texture, turgor normal. No rashes or lesions  Lymph nodes: Cervical, supraclavicular, and axillary nodes normal.  Neurologic: CN 2 -12 Normal, Normal symmetric reflexes. Normal coordination and gait  Psych: Alert & Oriented x 3, Mood appear stable.    Assessment:    Annual wellness medicare exam   Plan:    Medicare Attestation  I have personally reviewed:  The patient's medical and social history  Their use of alcohol, tobacco or illicit drugs  Their current medications and supplements  The patient's functional ability including ADLs,fall risks, home safety risks,  cognitive, and hearing and visual impairment  Diet and physical activities  Evidence for depression or mood disorders  The patient's weight, height, BMI, and visual acuity have been recorded in the chart. I have made referrals, counseling, and provided education to the patient based on review of the above and I have provided the patient with a written personalized care plan for preventive services.    Subjective:   Patient presents for Medicare Annual/Subsequent preventive examination.   Review Past Medical/Family/Social: See above   Risk Factors  Current exercise habits:  Dietary issues discussed:   Cardiac risk factors: Diabetes mellitus, hypertension, hyperlipidemia, cardiomyopathy  Depression Screen  (Note: if answer to either of the following is "Yes", a more complete depression screening is indicated)   Over the past two weeks, have you felt down, depressed or hopeless? No  Over the past two weeks, have you felt little interest or pleasure in doing things? No Have you lost interest or pleasure in daily life? No Do you often feel hopeless? No Do you cry easily over simple problems? No   Activities of Daily Living  In your present state of health, do you have any difficulty performing the following activities?:   Driving? No  Managing money? No  Feeding yourself? No  Getting from bed to chair? No  Climbing a flight of stairs? No  Preparing food and eating?: No  Bathing or showering? No  Getting dressed: No  Getting to the toilet? No  Using the toilet:No  Moving around from place to place: No  In the past year have you fallen or had a near fall?:No  Are you sexually active? No  Do you have more than one partner? No   Hearing Difficulties: No  Do you often ask people to speak up or repeat themselves? No  Do you experience ringing or noises in your ears? No  Do you have difficulty understanding soft or whispered voices? No  Do you feel that you have a problem with  memory? No Do you often misplace items? No    Home Safety:  Do you have a smoke alarm at your residence? Yes Do you have grab bars in the bathroom? No Do you have throw rugs in your house? Yes   Cognitive Testing  Alert? Yes Normal Appearance?Yes  Oriented to person? Yes Place? Yes  Time? Yes  Recall of three objects? Yes  Can perform simple calculations? Yes  Displays appropriate judgment?Yes  Can read the correct time from a watch face?Yes   List the Names of Other Physician/Practitioners you currently use:  See referral list for the physicians patient is currently seeing.   Cardiologists stated above  Review of Systems: As  above  Objective:    Head: Normocephalic, without obvious abnormality, atraumatic  Eyes: conj clear, EOMi PEERLA  Ears: normal TM's and external ear canals both ears  Nose: Nares normal. Septum midline. Mucosa normal. No drainage or sinus tenderness.  Throat: lips, mucosa, and tongue normal; teeth and gums normal  Neck: no adenopathy, no carotid bruit, no JVD, supple, symmetrical, trachea midline and thyroid not enlarged, symmetric, no tenderness/mass/nodules  No CVA tenderness.  Lungs: clear to auscultation bilaterally  Breasts: normal appearance, no masses or tenderness Heart: regular rate and rhythm, pacemaker present. Murmur unchanged Abdomen: soft, non-tender; bowel sounds normal; no masses, no organomegaly  Musculoskeletal: ROM normal in all joints, no crepitus, no deformity, Normal muscle strengthen. Back  is symmetric, no curvature. Skin: Skin color, texture, turgor normal. No rashes or lesions  Lymph nodes: Cervical, supraclavicular, and axillary nodes normal.  Neurologic: CN 2 -12 Normal, Normal symmetric reflexes. Normal coordination and gait  Psych: Alert & Oriented x 3, Mood appear stable.    Assessment:    Annual wellness medicare exam   Plan:    During the course of the visit the patient was educated and counseled about  appropriate screening and preventive services including:   Reminded annual eye exam  Reminded annual mammography  3 Hemoccult cards given  Annual influenza immunization reminded     Patient Instructions (the written plan) was given to the patient.  Medicare Attestation  I have personally reviewed:  The patient's medical and social history  Their use of alcohol, tobacco or illicit drugs  Their current medications and supplements  The patient's functional ability including ADLs,fall risks, home safety risks, cognitive, and hearing and visual impairment  Diet and physical activities  Evidence for depression or mood disorders  The patient's weight, height, BMI, and visual acuity have been recorded in the chart. I have made referrals, counseling, and provided education to the patient based on review of the above and I have provided the patient with a written personalized care plan for preventive services.

## 2013-08-15 DIAGNOSIS — R0989 Other specified symptoms and signs involving the circulatory and respiratory systems: Secondary | ICD-10-CM | POA: Diagnosis not present

## 2013-08-15 DIAGNOSIS — Z7901 Long term (current) use of anticoagulants: Secondary | ICD-10-CM | POA: Diagnosis not present

## 2013-08-15 DIAGNOSIS — I4729 Other ventricular tachycardia: Secondary | ICD-10-CM | POA: Diagnosis not present

## 2013-08-15 DIAGNOSIS — E039 Hypothyroidism, unspecified: Secondary | ICD-10-CM | POA: Diagnosis not present

## 2013-08-15 DIAGNOSIS — R0609 Other forms of dyspnea: Secondary | ICD-10-CM | POA: Diagnosis not present

## 2013-08-15 DIAGNOSIS — I472 Ventricular tachycardia: Secondary | ICD-10-CM | POA: Diagnosis not present

## 2013-08-15 DIAGNOSIS — E669 Obesity, unspecified: Secondary | ICD-10-CM | POA: Diagnosis not present

## 2013-08-15 DIAGNOSIS — I501 Left ventricular failure: Secondary | ICD-10-CM | POA: Diagnosis not present

## 2013-08-15 DIAGNOSIS — I428 Other cardiomyopathies: Secondary | ICD-10-CM | POA: Diagnosis not present

## 2013-08-15 DIAGNOSIS — Z95810 Presence of automatic (implantable) cardiac defibrillator: Secondary | ICD-10-CM | POA: Diagnosis not present

## 2013-08-29 DIAGNOSIS — R0989 Other specified symptoms and signs involving the circulatory and respiratory systems: Secondary | ICD-10-CM | POA: Diagnosis not present

## 2013-08-29 DIAGNOSIS — E039 Hypothyroidism, unspecified: Secondary | ICD-10-CM | POA: Diagnosis not present

## 2013-08-29 DIAGNOSIS — I428 Other cardiomyopathies: Secondary | ICD-10-CM | POA: Diagnosis not present

## 2013-08-29 DIAGNOSIS — R0609 Other forms of dyspnea: Secondary | ICD-10-CM | POA: Diagnosis not present

## 2013-08-29 DIAGNOSIS — I4729 Other ventricular tachycardia: Secondary | ICD-10-CM | POA: Diagnosis not present

## 2013-08-29 DIAGNOSIS — I501 Left ventricular failure: Secondary | ICD-10-CM | POA: Diagnosis not present

## 2013-08-29 DIAGNOSIS — Z7901 Long term (current) use of anticoagulants: Secondary | ICD-10-CM | POA: Diagnosis not present

## 2013-08-29 DIAGNOSIS — E669 Obesity, unspecified: Secondary | ICD-10-CM | POA: Diagnosis not present

## 2013-08-29 DIAGNOSIS — Z95810 Presence of automatic (implantable) cardiac defibrillator: Secondary | ICD-10-CM | POA: Diagnosis not present

## 2013-08-29 DIAGNOSIS — I472 Ventricular tachycardia: Secondary | ICD-10-CM | POA: Diagnosis not present

## 2013-09-01 ENCOUNTER — Other Ambulatory Visit: Payer: Self-pay

## 2013-09-01 DIAGNOSIS — Z1231 Encounter for screening mammogram for malignant neoplasm of breast: Secondary | ICD-10-CM

## 2013-09-15 ENCOUNTER — Encounter: Payer: Self-pay | Admitting: *Deleted

## 2013-09-18 DIAGNOSIS — H4011X0 Primary open-angle glaucoma, stage unspecified: Secondary | ICD-10-CM | POA: Diagnosis not present

## 2013-09-23 DIAGNOSIS — Z95810 Presence of automatic (implantable) cardiac defibrillator: Secondary | ICD-10-CM | POA: Diagnosis not present

## 2013-09-23 DIAGNOSIS — E039 Hypothyroidism, unspecified: Secondary | ICD-10-CM | POA: Diagnosis not present

## 2013-09-23 DIAGNOSIS — R0609 Other forms of dyspnea: Secondary | ICD-10-CM | POA: Diagnosis not present

## 2013-09-23 DIAGNOSIS — I472 Ventricular tachycardia: Secondary | ICD-10-CM | POA: Diagnosis not present

## 2013-09-23 DIAGNOSIS — I428 Other cardiomyopathies: Secondary | ICD-10-CM | POA: Diagnosis not present

## 2013-09-23 DIAGNOSIS — I501 Left ventricular failure: Secondary | ICD-10-CM | POA: Diagnosis not present

## 2013-09-23 DIAGNOSIS — E669 Obesity, unspecified: Secondary | ICD-10-CM | POA: Diagnosis not present

## 2013-09-23 DIAGNOSIS — I4729 Other ventricular tachycardia: Secondary | ICD-10-CM | POA: Diagnosis not present

## 2013-09-23 DIAGNOSIS — Z7901 Long term (current) use of anticoagulants: Secondary | ICD-10-CM | POA: Diagnosis not present

## 2013-09-26 ENCOUNTER — Ambulatory Visit
Admission: RE | Admit: 2013-09-26 | Discharge: 2013-09-26 | Disposition: A | Discharge status: Home / Self Care | Payer: Medicare Other | Source: Ambulatory Visit

## 2013-09-26 DIAGNOSIS — Z1231 Encounter for screening mammogram for malignant neoplasm of breast: Secondary | ICD-10-CM

## 2013-09-26 DIAGNOSIS — E669 Obesity, unspecified: Secondary | ICD-10-CM | POA: Diagnosis not present

## 2013-09-26 DIAGNOSIS — Z95810 Presence of automatic (implantable) cardiac defibrillator: Secondary | ICD-10-CM | POA: Diagnosis not present

## 2013-09-26 DIAGNOSIS — I428 Other cardiomyopathies: Secondary | ICD-10-CM | POA: Diagnosis not present

## 2013-09-26 DIAGNOSIS — I501 Left ventricular failure: Secondary | ICD-10-CM | POA: Diagnosis not present

## 2013-09-26 DIAGNOSIS — Z7901 Long term (current) use of anticoagulants: Secondary | ICD-10-CM | POA: Diagnosis not present

## 2013-09-26 DIAGNOSIS — R0609 Other forms of dyspnea: Secondary | ICD-10-CM | POA: Diagnosis not present

## 2013-09-26 DIAGNOSIS — I472 Ventricular tachycardia: Secondary | ICD-10-CM | POA: Diagnosis not present

## 2013-09-26 DIAGNOSIS — E039 Hypothyroidism, unspecified: Secondary | ICD-10-CM | POA: Diagnosis not present

## 2013-09-26 DIAGNOSIS — I4729 Other ventricular tachycardia: Secondary | ICD-10-CM | POA: Diagnosis not present

## 2013-10-28 ENCOUNTER — Encounter: Payer: Self-pay | Admitting: Cardiology

## 2013-10-28 DIAGNOSIS — I4729 Other ventricular tachycardia: Secondary | ICD-10-CM | POA: Diagnosis not present

## 2013-10-28 DIAGNOSIS — I428 Other cardiomyopathies: Secondary | ICD-10-CM | POA: Diagnosis not present

## 2013-10-28 DIAGNOSIS — R0989 Other specified symptoms and signs involving the circulatory and respiratory systems: Secondary | ICD-10-CM | POA: Diagnosis not present

## 2013-10-28 DIAGNOSIS — Z7901 Long term (current) use of anticoagulants: Secondary | ICD-10-CM | POA: Diagnosis not present

## 2013-10-28 DIAGNOSIS — I501 Left ventricular failure: Secondary | ICD-10-CM | POA: Diagnosis not present

## 2013-10-28 DIAGNOSIS — Z95810 Presence of automatic (implantable) cardiac defibrillator: Secondary | ICD-10-CM

## 2013-10-28 DIAGNOSIS — E669 Obesity, unspecified: Secondary | ICD-10-CM | POA: Diagnosis not present

## 2013-10-28 DIAGNOSIS — Q210 Ventricular septal defect: Secondary | ICD-10-CM | POA: Diagnosis not present

## 2013-10-28 DIAGNOSIS — E039 Hypothyroidism, unspecified: Secondary | ICD-10-CM | POA: Diagnosis not present

## 2013-10-28 DIAGNOSIS — R0609 Other forms of dyspnea: Secondary | ICD-10-CM | POA: Diagnosis not present

## 2013-10-28 DIAGNOSIS — I472 Ventricular tachycardia: Secondary | ICD-10-CM | POA: Diagnosis not present

## 2013-10-28 DIAGNOSIS — I5022 Chronic systolic (congestive) heart failure: Secondary | ICD-10-CM | POA: Diagnosis not present

## 2013-10-28 NOTE — Progress Notes (Unsigned)
Patient ID: Casey [name], female   DOB: [birth year], 79 y.o.   MRN: [id number]  Casey [name]  Date of visit:  10/28/2013 DOB:  Sep 05, 1934    Age:  79 yrs. Medical record number:  [id number]     Account number:  [id number] Primary Care Provider: [facility] ____________________________ CURRENT DIAGNOSES  1. Cardiomyopathy Idiopathic  2. Congestive Heart Failure Chronic Systolic  3. Ventricular tachycardia  4. Obesity(BMI30-40)  5. Long Term Use Anticoagulant  6. Interventricular Septal Defect  7. Hypothyroidism  8. AICD in situ  9. Dyspnea  10. Congestive Heart Failure ____________________________ ALLERGIES  Candesartan, Severe nausea & vomiting  Codeine, Rash  Hydralazine, Lethargy  Isosorbide, Lethargy ____________________________ MEDICATIONS  1. Vitamin D3 400 unit Capsule, 1 p.o. daily  2. allopurinol 100 mg tablet, 1 p.o. daily  3. Azopt 1 % drops,suspension, 1 gtt od bid  4. Simbrinza 1-0.2 % drops,suspension, 1 gtt od bid  5. multivitamin tablet, 1 p.o. daily  6. levothyroxine 50 mcg tablet, 1 p.o. daily  7. Klor-Con M20 mEq tablet,extended release, 1 p.o. q.d.  8. lisinopril 20 mg tablet, BID  9. amiodarone 200 mg tablet, 1 p.o. daily  10. Proventil HFA 90 mcg/actuation aerosol inhaler, PRN  11. furosemide 40 mg tablet, BID  12. warfarin 2.5 mg tablet, 1 daily or as directed  13. metoprolol succinate ER 50 mg tablet,extended release 24 hr, 1 p.o. daily ____________________________ CHIEF COMPLAINTS  Followup of Congestive Heart Failure ____________________________ HISTORY OF PRESENT ILLNESS Patient seen for cardiac followup. She has been doing well from a cardiac viewpoint and has had no more dyspnea. She continues under situational stress with her husband who has a number of medical issues. She has been watching her weight and water consumption and with the change in her diuretics before has not had any recurrence of heart failure. She actually feels  fairly well and her weight has been stable. She denies PND, orthopnea or edema. No bleeding complications from warfarin. ____________________________ PAST HISTORY  Past Medical Illnesses:  history of peptic ulcer disease, lumbar disc disease, left arm  DVT 12/07, DM-diet controlled, gout, hypothyroidism;  Cardiovascular Illnesses:  cardiomyopathy(idiopathic), CHF, insertion of biventricular defibrillator 6/05, ventricular tachycardia;  Surgical Procedures:  cesarean section, hysterectomy, pin rt femur, Medtronic AICD implant June 2005;  Cardiology Procedures-Invasive:  cardiac cath (left) May 2005, replacement of fractured defibrillator lead 11/07, Medtronic generator change July 2009, Medtronic generator change November 2014;  Cardiology Procedures-Noninvasive:  echocardiogram November 2009, echocardiogram June 2011, echocardiogram June 2013, echocardiogram January 2015;  Cardiac Cath Results:  normal coronary arteries, global hypokinesis;  LVEF of 20% documented via echocardiogram on 07/09/2013,   ____________________________ CARDIO-PULMONARY TEST DATES EKG Date:  07/01/2013;   Cardiac Cath Date:  11/02/2003;  Echocardiography Date: 07/09/2013;  Chest Xray Date: 07/09/2013;   ____________________________ FAMILY HISTORY Brother -- Brother dead Father -- Father dead, Ischemic heart disease at greater than 37 years Mother -- Mother dead, Myocardial infarction at greater than 99 Sister -- Sister dead ____________________________ SOCIAL HISTORY Alcohol Use:  no alcohol use;  Smoking:  used to smoke but quit;  Diet:  regular diet;  Lifestyle:  married, 1 daughter and 1 son;  Exercise:  no regular exercise;  Occupation:  retired and Surveyor, [employer];  Residence:  lives with husband;   ____________________________ REVIEW OF SYSTEMS General:  no change in weight Eyes: wears eye glasses/contact lenses, decreased acuity O.D. Respiratory: denies dyspnea, cough, wheezing or hemoptysis. Cardiovascular:  please  review HPI Abdominal: denies  dyspepsia, GI bleeding, constipation, or diarrheaGenitourinary-Female: no dysuria, urgency, frequency, UTIs, or stress incontinence Musculoskeletal:  chronic low back pain Psychiatric:  situational stress  ____________________________ PHYSICAL EXAMINATION VITAL SIGNS  Blood Pressure:  124/80 Sitting, Left arm, regular cuff  , 126/88 Standing, Left arm and regular cuff   Pulse:  80/min. Weight:  168.00 lbs. Height:  62"BMI: 30  Constitutional:  pleasant African American female in no acute distress, mildly obese Skin:  warm and dry to touch, no apparent skin lesions, or masses noted. Head:  normocephalic, normal hair pattern, no masses or tenderness ENT:  ears, nose and throat reveal no gross abnormalities.  Dentition good. Neck:  supple, no masses, thyromegaly, JVD. Carotid pulses are full and equal bilaterally without bruits. Chest:  clear to auscultation, normal A-P diameter, healed ICD incision in the left pectoral area Cardiac:  S1,S2, no S3 or S4 present, regular rhythm, grade 2/6 early systolic murmur left sternal border Peripheral Pulses:  pulses full and equal in all extremities Extremities & Back:  trace edema Neurological:  no gross motor or sensory deficits noted, affect appropriate, oriented x3. ____________________________ MOST RECENT LIPID PANEL 04/29/13  CHOL TOTL 255 mg/dl, LDL 173 NM, HDL 64 mg/dl, TRIGLYCER 89 mg/dl, CHOL/HDL 4.0 (Calc)  ____________________________ IMPRESSIONS/PLAN  1. Idiopathic cardiomyopathy stable 2. Functioning biventricular defibrillator 3. Long-term use of anticoagulation 4. Ventricular tachycardia suppressed  Recommendations:  Return visit in 6 months. Continue remote monitoring. Continue warfarin anticoagulation. ____________________________ TODAYS ORDERS  1. Return Visit: 6 months  2. 12 Lead EKG: 6 months  3. Coag Clinic Visit: Coag OV 1 month                        ____________________________ Cardiology Physician:  Kerry Hough MD [facility]

## 2013-11-06 ENCOUNTER — Other Ambulatory Visit: Payer: Medicare Other | Admitting: Internal Medicine

## 2013-11-06 ENCOUNTER — Other Ambulatory Visit: Payer: Self-pay | Admitting: Internal Medicine

## 2013-11-06 DIAGNOSIS — Z79899 Other long term (current) drug therapy: Secondary | ICD-10-CM | POA: Diagnosis not present

## 2013-11-06 DIAGNOSIS — E119 Type 2 diabetes mellitus without complications: Secondary | ICD-10-CM

## 2013-11-06 DIAGNOSIS — E785 Hyperlipidemia, unspecified: Secondary | ICD-10-CM

## 2013-11-06 DIAGNOSIS — E039 Hypothyroidism, unspecified: Secondary | ICD-10-CM | POA: Diagnosis not present

## 2013-11-06 DIAGNOSIS — I10 Essential (primary) hypertension: Secondary | ICD-10-CM | POA: Diagnosis not present

## 2013-11-06 LAB — LIPID PANEL
Cholesterol: 276 mg/dL — ABNORMAL HIGH (ref 0–200)
HDL: 65 mg/dL (ref >39)
LDL CALC: 190 mg/dL — AB (ref 0–99)
Total CHOL/HDL Ratio: 4.2 Ratio
Triglycerides: 104 mg/dL (ref <150)
VLDL: 21 mg/dL (ref 0–40)

## 2013-11-06 LAB — HEPATIC FUNCTION PANEL
ALK PHOS: 116 U/L (ref 39–117)
ALT: 24 U/L (ref 0–35)
AST: 24 U/L (ref 0–37)
Albumin: 4.1 g/dL (ref 3.5–5.2)
BILIRUBIN DIRECT: 0.2 mg/dL (ref 0.0–0.3)
BILIRUBIN INDIRECT: 0.9 mg/dL (ref 0.2–1.2)
BILIRUBIN TOTAL: 1.1 mg/dL (ref 0.2–1.2)
Total Protein: 6.6 g/dL (ref 6.0–8.3)

## 2013-11-06 LAB — HEMOGLOBIN A1C
Hgb A1c MFr Bld: 6 % — ABNORMAL HIGH (ref <5.7)
Mean Plasma Glucose: 126 mg/dL — ABNORMAL HIGH (ref <117)

## 2013-11-07 ENCOUNTER — Ambulatory Visit (INDEPENDENT_AMBULATORY_CARE_PROVIDER_SITE_OTHER): Payer: Medicare Other | Admitting: Internal Medicine

## 2013-11-07 ENCOUNTER — Encounter: Payer: Self-pay | Admitting: Internal Medicine

## 2013-11-07 VITALS — BP 124/90 | HR 76 | Wt 169.0 lb

## 2013-11-07 DIAGNOSIS — I428 Other cardiomyopathies: Secondary | ICD-10-CM

## 2013-11-07 DIAGNOSIS — E785 Hyperlipidemia, unspecified: Secondary | ICD-10-CM

## 2013-11-07 DIAGNOSIS — E039 Hypothyroidism, unspecified: Secondary | ICD-10-CM

## 2013-11-07 DIAGNOSIS — I10 Essential (primary) hypertension: Secondary | ICD-10-CM

## 2013-11-07 DIAGNOSIS — Z95810 Presence of automatic (implantable) cardiac defibrillator: Secondary | ICD-10-CM

## 2013-11-07 DIAGNOSIS — I429 Cardiomyopathy, unspecified: Secondary | ICD-10-CM

## 2013-11-07 LAB — BASIC METABOLIC PANEL
BUN: 29 mg/dL — ABNORMAL HIGH (ref 6–23)
CO2: 25 meq/L (ref 19–32)
Calcium: 9.7 mg/dL (ref 8.4–10.5)
Chloride: 109 mEq/L (ref 96–112)
Creat: 1.54 mg/dL — ABNORMAL HIGH (ref 0.50–1.10)
Glucose, Bld: 113 mg/dL — ABNORMAL HIGH (ref 70–99)
Potassium: 4 mEq/L (ref 3.5–5.3)
SODIUM: 145 meq/L (ref 135–145)

## 2013-11-07 LAB — TSH: TSH: 1.805 u[IU]/mL (ref 0.350–4.500)

## 2013-11-07 NOTE — Progress Notes (Signed)
   Subjective:    Patient ID: Casey [name], female    DOB: 1934/07/25, 79 y.o.   MRN: [id number]  HPI In today for six-month recheck on idiopathic cardiomyopathy, has in situ cardiac defibrillator, history of hyperlipidemia but patient says  cardiologist has told her not to take cholesterol-lowering medication because she is on amiodarone. Lipids are elevated. TSH and basic metabolic panel are pending. Hemoglobin A1c stable at 6%. History controlled type 2 diabetes. Sees Dr. Ricki Miller frequently because she has glaucoma so has frequent diabetic eye exams as well.    Review of Systems     Objective:   Physical Exam Skin warm and dry. Nodes none. Neck is supple without JVD thyromegaly or carotid bruits. Chest clear to auscultation without rales or wheezing. Cardiac exam regular rate and rhythm normal S1 and S2. Extremities without edema.       Assessment & Plan:  Idiopathic cardiomyopathy  Implantable defibrillator  Controlled type 2 diabetes mellitus  Hyperlipidemia-currently not on statin medication per cardiologist  Hypertension-stable  Glaucoma  Hypothyroidism-TSH pending  Chronic kidney disease-basic metabolic panel pending  Chronic anticoagulation therapy with Coumadin  Plan: Return in December for physical examination.

## 2013-11-07 NOTE — Patient Instructions (Signed)
Continue same medications. Watch diet since she cannot take cholesterol medication and return in December for physical examination. Appointment made for December because she will be seeing Dr. Wynonia Lawman in November.

## 2013-11-12 ENCOUNTER — Encounter: Payer: Self-pay | Admitting: Cardiology

## 2013-11-18 DIAGNOSIS — H4011X0 Primary open-angle glaucoma, stage unspecified: Secondary | ICD-10-CM | POA: Diagnosis not present

## 2013-11-25 DIAGNOSIS — Z95810 Presence of automatic (implantable) cardiac defibrillator: Secondary | ICD-10-CM | POA: Diagnosis not present

## 2013-11-25 DIAGNOSIS — E669 Obesity, unspecified: Secondary | ICD-10-CM | POA: Diagnosis not present

## 2013-11-25 DIAGNOSIS — R0602 Shortness of breath: Secondary | ICD-10-CM | POA: Diagnosis not present

## 2013-11-25 DIAGNOSIS — I472 Ventricular tachycardia: Secondary | ICD-10-CM | POA: Diagnosis not present

## 2013-11-25 DIAGNOSIS — I5022 Chronic systolic (congestive) heart failure: Secondary | ICD-10-CM | POA: Diagnosis not present

## 2013-11-25 DIAGNOSIS — I4729 Other ventricular tachycardia: Secondary | ICD-10-CM | POA: Diagnosis not present

## 2013-11-25 DIAGNOSIS — Z7901 Long term (current) use of anticoagulants: Secondary | ICD-10-CM | POA: Diagnosis not present

## 2013-11-25 DIAGNOSIS — I428 Other cardiomyopathies: Secondary | ICD-10-CM | POA: Diagnosis not present

## 2013-11-25 DIAGNOSIS — E039 Hypothyroidism, unspecified: Secondary | ICD-10-CM | POA: Diagnosis not present

## 2013-12-23 DIAGNOSIS — E039 Hypothyroidism, unspecified: Secondary | ICD-10-CM | POA: Diagnosis not present

## 2013-12-23 DIAGNOSIS — I472 Ventricular tachycardia: Secondary | ICD-10-CM | POA: Diagnosis not present

## 2013-12-23 DIAGNOSIS — I428 Other cardiomyopathies: Secondary | ICD-10-CM | POA: Diagnosis not present

## 2013-12-23 DIAGNOSIS — I4729 Other ventricular tachycardia: Secondary | ICD-10-CM | POA: Diagnosis not present

## 2013-12-23 DIAGNOSIS — Z95810 Presence of automatic (implantable) cardiac defibrillator: Secondary | ICD-10-CM | POA: Diagnosis not present

## 2013-12-23 DIAGNOSIS — E669 Obesity, unspecified: Secondary | ICD-10-CM | POA: Diagnosis not present

## 2013-12-23 DIAGNOSIS — Q210 Ventricular septal defect: Secondary | ICD-10-CM | POA: Diagnosis not present

## 2013-12-23 DIAGNOSIS — I5022 Chronic systolic (congestive) heart failure: Secondary | ICD-10-CM | POA: Diagnosis not present

## 2013-12-23 DIAGNOSIS — Z7901 Long term (current) use of anticoagulants: Secondary | ICD-10-CM | POA: Diagnosis not present

## 2013-12-25 DIAGNOSIS — I5022 Chronic systolic (congestive) heart failure: Secondary | ICD-10-CM | POA: Diagnosis not present

## 2013-12-25 DIAGNOSIS — I428 Other cardiomyopathies: Secondary | ICD-10-CM | POA: Diagnosis not present

## 2013-12-25 DIAGNOSIS — Z95810 Presence of automatic (implantable) cardiac defibrillator: Secondary | ICD-10-CM | POA: Diagnosis not present

## 2013-12-25 DIAGNOSIS — R0602 Shortness of breath: Secondary | ICD-10-CM | POA: Diagnosis not present

## 2013-12-25 DIAGNOSIS — I4729 Other ventricular tachycardia: Secondary | ICD-10-CM | POA: Diagnosis not present

## 2013-12-25 DIAGNOSIS — E669 Obesity, unspecified: Secondary | ICD-10-CM | POA: Diagnosis not present

## 2013-12-25 DIAGNOSIS — Z7901 Long term (current) use of anticoagulants: Secondary | ICD-10-CM | POA: Diagnosis not present

## 2013-12-25 DIAGNOSIS — E039 Hypothyroidism, unspecified: Secondary | ICD-10-CM | POA: Diagnosis not present

## 2013-12-25 DIAGNOSIS — I472 Ventricular tachycardia: Secondary | ICD-10-CM | POA: Diagnosis not present

## 2014-01-22 DIAGNOSIS — I472 Ventricular tachycardia: Secondary | ICD-10-CM | POA: Diagnosis not present

## 2014-01-22 DIAGNOSIS — E669 Obesity, unspecified: Secondary | ICD-10-CM | POA: Diagnosis not present

## 2014-01-22 DIAGNOSIS — Z7901 Long term (current) use of anticoagulants: Secondary | ICD-10-CM | POA: Diagnosis not present

## 2014-01-22 DIAGNOSIS — Z95810 Presence of automatic (implantable) cardiac defibrillator: Secondary | ICD-10-CM | POA: Diagnosis not present

## 2014-01-22 DIAGNOSIS — I5022 Chronic systolic (congestive) heart failure: Secondary | ICD-10-CM | POA: Diagnosis not present

## 2014-01-22 DIAGNOSIS — R0602 Shortness of breath: Secondary | ICD-10-CM | POA: Diagnosis not present

## 2014-01-22 DIAGNOSIS — E039 Hypothyroidism, unspecified: Secondary | ICD-10-CM | POA: Diagnosis not present

## 2014-01-22 DIAGNOSIS — I4729 Other ventricular tachycardia: Secondary | ICD-10-CM | POA: Diagnosis not present

## 2014-01-22 DIAGNOSIS — I428 Other cardiomyopathies: Secondary | ICD-10-CM | POA: Diagnosis not present

## 2014-02-05 DIAGNOSIS — Z95810 Presence of automatic (implantable) cardiac defibrillator: Secondary | ICD-10-CM | POA: Diagnosis not present

## 2014-02-05 DIAGNOSIS — E039 Hypothyroidism, unspecified: Secondary | ICD-10-CM | POA: Diagnosis not present

## 2014-02-05 DIAGNOSIS — E669 Obesity, unspecified: Secondary | ICD-10-CM | POA: Diagnosis not present

## 2014-02-05 DIAGNOSIS — I472 Ventricular tachycardia: Secondary | ICD-10-CM | POA: Diagnosis not present

## 2014-02-05 DIAGNOSIS — I428 Other cardiomyopathies: Secondary | ICD-10-CM | POA: Diagnosis not present

## 2014-02-05 DIAGNOSIS — Z7901 Long term (current) use of anticoagulants: Secondary | ICD-10-CM | POA: Diagnosis not present

## 2014-02-05 DIAGNOSIS — R0602 Shortness of breath: Secondary | ICD-10-CM | POA: Diagnosis not present

## 2014-02-05 DIAGNOSIS — I5022 Chronic systolic (congestive) heart failure: Secondary | ICD-10-CM | POA: Diagnosis not present

## 2014-02-05 DIAGNOSIS — I4729 Other ventricular tachycardia: Secondary | ICD-10-CM | POA: Diagnosis not present

## 2014-02-19 DIAGNOSIS — R0602 Shortness of breath: Secondary | ICD-10-CM | POA: Diagnosis not present

## 2014-02-19 DIAGNOSIS — Z95810 Presence of automatic (implantable) cardiac defibrillator: Secondary | ICD-10-CM | POA: Diagnosis not present

## 2014-02-19 DIAGNOSIS — I472 Ventricular tachycardia: Secondary | ICD-10-CM | POA: Diagnosis not present

## 2014-02-19 DIAGNOSIS — I428 Other cardiomyopathies: Secondary | ICD-10-CM | POA: Diagnosis not present

## 2014-02-19 DIAGNOSIS — E039 Hypothyroidism, unspecified: Secondary | ICD-10-CM | POA: Diagnosis not present

## 2014-02-19 DIAGNOSIS — Z7901 Long term (current) use of anticoagulants: Secondary | ICD-10-CM | POA: Diagnosis not present

## 2014-02-19 DIAGNOSIS — I4729 Other ventricular tachycardia: Secondary | ICD-10-CM | POA: Diagnosis not present

## 2014-02-19 DIAGNOSIS — E669 Obesity, unspecified: Secondary | ICD-10-CM | POA: Diagnosis not present

## 2014-02-19 DIAGNOSIS — I5022 Chronic systolic (congestive) heart failure: Secondary | ICD-10-CM | POA: Diagnosis not present

## 2014-02-27 DIAGNOSIS — H35379 Puckering of macula, unspecified eye: Secondary | ICD-10-CM | POA: Diagnosis not present

## 2014-02-27 DIAGNOSIS — E119 Type 2 diabetes mellitus without complications: Secondary | ICD-10-CM | POA: Diagnosis not present

## 2014-02-27 DIAGNOSIS — H4011X0 Primary open-angle glaucoma, stage unspecified: Secondary | ICD-10-CM | POA: Diagnosis not present

## 2014-02-27 DIAGNOSIS — H409 Unspecified glaucoma: Secondary | ICD-10-CM | POA: Diagnosis not present

## 2014-02-27 LAB — HM DIABETES EYE EXAM

## 2014-03-25 ENCOUNTER — Telehealth: Payer: Self-pay | Admitting: Internal Medicine

## 2014-03-25 ENCOUNTER — Ambulatory Visit (INDEPENDENT_AMBULATORY_CARE_PROVIDER_SITE_OTHER): Payer: Medicare Other | Admitting: Internal Medicine

## 2014-03-25 ENCOUNTER — Other Ambulatory Visit: Payer: Self-pay | Admitting: Internal Medicine

## 2014-03-25 DIAGNOSIS — Z23 Encounter for immunization: Secondary | ICD-10-CM | POA: Diagnosis not present

## 2014-03-25 MED ORDER — ALLOPURINOL 100 MG PO TABS: 100.0000 mg | ORAL_TABLET | Freq: Every day | ORAL | Status: DC

## 2014-03-25 NOTE — Telephone Encounter (Signed)
Refill Allopurinol 100 mg daily x one year

## 2014-03-31 ENCOUNTER — Encounter: Payer: Self-pay | Admitting: Internal Medicine

## 2014-04-16 DIAGNOSIS — R0602 Shortness of breath: Secondary | ICD-10-CM | POA: Diagnosis not present

## 2014-04-16 DIAGNOSIS — E039 Hypothyroidism, unspecified: Secondary | ICD-10-CM | POA: Diagnosis not present

## 2014-04-16 DIAGNOSIS — I5022 Chronic systolic (congestive) heart failure: Secondary | ICD-10-CM | POA: Diagnosis not present

## 2014-04-16 DIAGNOSIS — I472 Ventricular tachycardia: Secondary | ICD-10-CM | POA: Diagnosis not present

## 2014-04-16 DIAGNOSIS — I429 Cardiomyopathy, unspecified: Secondary | ICD-10-CM | POA: Diagnosis not present

## 2014-04-16 DIAGNOSIS — E668 Other obesity: Secondary | ICD-10-CM | POA: Diagnosis not present

## 2014-04-16 DIAGNOSIS — Z95810 Presence of automatic (implantable) cardiac defibrillator: Secondary | ICD-10-CM | POA: Diagnosis not present

## 2014-04-16 DIAGNOSIS — Z7901 Long term (current) use of anticoagulants: Secondary | ICD-10-CM | POA: Diagnosis not present

## 2014-04-29 ENCOUNTER — Encounter: Payer: Self-pay | Admitting: Cardiology

## 2014-04-29 ENCOUNTER — Ambulatory Visit (INDEPENDENT_AMBULATORY_CARE_PROVIDER_SITE_OTHER): Payer: Medicare Other | Admitting: Internal Medicine

## 2014-04-29 ENCOUNTER — Encounter: Payer: Self-pay | Admitting: Internal Medicine

## 2014-04-29 VITALS — BP 126/90 | HR 64 | Ht 62.0 in | Wt 172.0 lb

## 2014-04-29 DIAGNOSIS — I428 Other cardiomyopathies: Secondary | ICD-10-CM

## 2014-04-29 DIAGNOSIS — I472 Ventricular tachycardia: Secondary | ICD-10-CM | POA: Diagnosis not present

## 2014-04-29 DIAGNOSIS — I429 Cardiomyopathy, unspecified: Secondary | ICD-10-CM | POA: Diagnosis not present

## 2014-04-29 DIAGNOSIS — E039 Hypothyroidism, unspecified: Secondary | ICD-10-CM | POA: Diagnosis not present

## 2014-04-29 DIAGNOSIS — Q210 Ventricular septal defect: Secondary | ICD-10-CM | POA: Diagnosis not present

## 2014-04-29 DIAGNOSIS — R0602 Shortness of breath: Secondary | ICD-10-CM | POA: Diagnosis not present

## 2014-04-29 DIAGNOSIS — E668 Other obesity: Secondary | ICD-10-CM | POA: Diagnosis not present

## 2014-04-29 DIAGNOSIS — N183 Chronic kidney disease, stage 3 unspecified: Secondary | ICD-10-CM | POA: Insufficient documentation

## 2014-04-29 DIAGNOSIS — I5022 Chronic systolic (congestive) heart failure: Secondary | ICD-10-CM

## 2014-04-29 DIAGNOSIS — Z95810 Presence of automatic (implantable) cardiac defibrillator: Secondary | ICD-10-CM

## 2014-04-29 DIAGNOSIS — Z7901 Long term (current) use of anticoagulants: Secondary | ICD-10-CM | POA: Insufficient documentation

## 2014-04-29 DIAGNOSIS — I4729 Other ventricular tachycardia: Secondary | ICD-10-CM

## 2014-04-29 LAB — MDC_IDC_ENUM_SESS_TYPE_INCLINIC
Battery Voltage: 2.99 V
Brady Statistic AP VP Percent: 7.69 %
Brady Statistic AP VS Percent: 0.15 %
Brady Statistic AS VP Percent: 90.75 %
Brady Statistic AS VS Percent: 1.4 %
Brady Statistic RV Percent Paced: 0.2 %
HIGH POWER IMPEDANCE MEASURED VALUE: 40 Ohm
HighPow Impedance: 190 Ohm
HighPow Impedance: 53 Ohm
Lead Channel Impedance Value: 4047 Ohm
Lead Channel Impedance Value: 4047 Ohm
Lead Channel Impedance Value: 418 Ohm
Lead Channel Impedance Value: 589 Ohm
Lead Channel Pacing Threshold Amplitude: 1.125 V
Lead Channel Pacing Threshold Pulse Width: 0.6 ms
Lead Channel Sensing Intrinsic Amplitude: 2.75 mV
Lead Channel Sensing Intrinsic Amplitude: 7 mV
Lead Channel Setting Pacing Amplitude: 2 V
Lead Channel Setting Pacing Amplitude: 2.5 V
Lead Channel Setting Pacing Amplitude: 2.5 V
Lead Channel Setting Sensing Sensitivity: 0.3 mV
MDC IDC MSMT BATTERY REMAINING LONGEVITY: 98 mo
MDC IDC MSMT LEADCHNL LV PACING THRESHOLD AMPLITUDE: 1.625 V
MDC IDC MSMT LEADCHNL RA IMPEDANCE VALUE: 475 Ohm
MDC IDC MSMT LEADCHNL RA PACING THRESHOLD PULSEWIDTH: 0.4 ms
MDC IDC MSMT LEADCHNL RV PACING THRESHOLD AMPLITUDE: 0.375 V
MDC IDC MSMT LEADCHNL RV PACING THRESHOLD PULSEWIDTH: 0.4 ms
MDC IDC SESS DTM: 20151118150419
MDC IDC SET LEADCHNL LV PACING PULSEWIDTH: 0.6 ms
MDC IDC SET LEADCHNL RV PACING PULSEWIDTH: 0.4 ms
MDC IDC SET ZONE DETECTION INTERVAL: 250 ms
MDC IDC SET ZONE DETECTION INTERVAL: 350 ms
MDC IDC SET ZONE DETECTION INTERVAL: 540 ms
MDC IDC STAT BRADY RA PERCENT PACED: 7.85 %
Zone Setting Detection Interval: 300 ms
Zone Setting Detection Interval: 430 ms

## 2014-04-29 NOTE — Progress Notes (Signed)
Patient ID: Casey [name], female   DOB: 1935-02-05, 79 y.o.   MRN: [id number]   Rikia, Trinkle  Date of visit:  04/29/2014 DOB:  [birth year]    Age:  79 yrs. Medical record number:  [id number]     Account number:  [id number] Primary Care Provider: [facility] ____________________________ CURRENT DIAGNOSES  1. Cardiomyopathy, unspecified  2. Chronic systolic (congestive) heart failure  3. Ventricular tachycardia  4. Other obesity  5. Long term (current) use of anticoagulants  6. Chronic kidney disease, stage 3 (moderate)  7. Ventricular septal defect  8. Hypothyroidism, unspecified  9. Presence of automatic (implantable) cardiac defibrillator ____________________________ ALLERGIES  Candesartan, Severe nausea & vomiting  Codeine, Rash  Hydralazine, Lethargy  Isosorbide, Lethargy ____________________________ MEDICATIONS  1. Vitamin D3 400 unit Capsule, 1 p.o. daily  2. allopurinol 100 mg tablet, 1 p.o. daily  3. multivitamin tablet, 1 p.o. daily  4. Klor-Con M20 mEq tablet,extended release, 1 p.o. q.d.  5. lisinopril 20 mg tablet, BID  6. amiodarone 200 mg tablet, 1 p.o. daily  7. Proventil HFA 90 mcg/actuation aerosol inhaler, PRN  8. furosemide 40 mg tablet, BID  9. metoprolol succinate ER 50 mg tablet,extended release 24 hr, 1 p.o. daily  10. dorzolamide 2 % eye drops, 1 gtt od bid  11. Lumigan 0.01 % eye drops, 1 gtt ou qhs  12. levothyroxine 50 mcg tablet, 1 p.o. daily  13. Alphagan P 0.1 % eye drops, 1 gtt od bid  14. warfarin 2.5 mg tablet, 1 daily or as directed  15. warfarin 3 mg tablet, take as directed ____________________________ CHIEF COMPLAINTS  Followup of Cardiomyopathy, unspecified  Followup of Chronic systolic (congestive) heart failure ____________________________ HISTORY OF PRESENT ILLNESS Patient seen for cardiac followup. She has a nonischemic cardiomyopathy and has been doing relatively well. She has a implantable defibrillator and has  really not had any edema or decompensated CHF. She has a moderate amount of fatigue. She has been on warfarin therapy for a while and has had previous atrial fibrillation but none recently. She also has ventricular tachycardia and is on amiodarone and lab has been fine on that. She has developed hypothyroidism and has been on some Synthroid. She denies angina and has no PND, orthopnea or edema. She has no bleeding complications from warfarin. She has been under situational stress with her husband that has been difficult. ____________________________ PAST HISTORY  Past Medical Illnesses:  history of peptic ulcer disease, lumbar disc disease, left arm  DVT 12/07, DM-diet controlled, gout, hypothyroidism, chronic kidney disease Stage 3;  Cardiovascular Illnesses:  cardiomyopathy(idiopathic), CHF, insertion of biventricular defibrillator 6/05, ventricular tachycardia;  Surgical Procedures:  cesarean section, hysterectomy, pin rt femur, Medtronic AICD implant June 2005;  Cardiology Procedures-Invasive:  cardiac cath (left) May 2005, replacement of fractured defibrillator lead 11/07, Medtronic generator change July 2009, Medtronic generator change November 2014;  Cardiology Procedures-Noninvasive:  echocardiogram November 2009, echocardiogram June 2011, echocardiogram June 2013, echocardiogram January 2015;  Cardiac Cath Results:  normal coronary arteries, global hypokinesis;  LVEF of 20% documented via echocardiogram on 07/09/2013,   ____________________________ CARDIO-PULMONARY TEST DATES EKG Date:  04/29/2014;   Cardiac Cath Date:  11/02/2003;  Echocardiography Date: 07/09/2013;  Chest Xray Date: 07/09/2013;   ____________________________ FAMILY HISTORY Brother -- Brother dead Father -- Father dead, Ischemic heart disease at greater than 52 years Mother -- Mother dead, Myocardial infarction at greater than 45 Sister -- Sister dead ____________________________ SOCIAL HISTORY Alcohol Use:  no alcohol use;   Smoking:  used to smoke but quit;  Diet:  regular diet;  Lifestyle:  married, 1 daughter and 1 son;  Exercise:  no regular exercise;  Occupation:  retired and Surveyor, minerals;  Residence:  lives with husband;   ____________________________ REVIEW OF SYSTEMS General:  weight gain of 3 pounds, malaise and fatigue Eyes: wears eye glasses/contact lenses, decreased acuity O.D. Respiratory: denies dyspnea, cough, wheezing or hemoptysis. Cardiovascular:  please review HPI Abdominal: denies dyspepsia, GI bleeding, constipation, or diarrheaGenitourinary-Female: no dysuria, urgency, frequency, UTIs, or stress incontinence Musculoskeletal:  chronic low back pain Psychiatric:  situational stress  ____________________________ PHYSICAL EXAMINATION VITAL SIGNS  Blood Pressure:  114/70 Sitting, Left arm, regular cuff  , 122/74 Standing, Left arm and regular cuff   Pulse:  80/min. Weight:  171.00 lbs. Height:  62"BMI: 31  Constitutional:  pleasant African American female in no acute distress, mildly obese Skin:  warm and dry to touch, no apparent skin lesions, or masses noted. Head:  normocephalic, normal hair pattern, no masses or tenderness ENT:  ears, nose and throat reveal no gross abnormalities.  Dentition good. Neck:  supple, no masses, thyromegaly, JVD. Carotid pulses are full and equal bilaterally without bruits. Chest:  clear to auscultation, normal A-P diameter, healed ICD incision in the left pectoral area Cardiac:  S1,S2, no S3 or S4 present, regular rhythm, grade 2/6 early systolic murmur left sternal border Peripheral Pulses:  pulses full and equal in all extremities Extremities & Back:  trace edema Neurological:  no gross motor or sensory deficits noted, affect appropriate, oriented x3. ____________________________ MOST RECENT LIPID PANEL 11/06/13  CHOL TOTL 276 mg/dl, LDL 190 NM, HDL 65 mg/dl, TRIGLYCER 104 mg/dl, CHOL/HDL 4.2 (Calc)  ____________________________ IMPRESSIONS/PLAN  1.  Nonischemic cardiomyopathy clinically stable 2. Long-term anticoagulation with warfarin without complications 3. Functioning implantable defibrillator 4. Small VSD stable 5. Chronic amiodarone therapy without complications  Recommendations:  Continue remote defibrillator followup. Followup in 6 months. Recent lab work reviewed. Call if problems. ____________________________ TODAYS ORDERS  1. Coag Clinic Visit: Coag OV 1 month  2. 12 Lead EKG: Today  3. Return Visit: 6 months  4. 12 Lead EKG: 6 months                       ____________________________ Cardiology Physician:  Kerry Hough MD [facility]

## 2014-04-29 NOTE — Patient Instructions (Signed)
Your physician recommends that you continue on your current medications as directed. Please refer to the Current Medication list given to you today.  Your physician wants you to follow-up in: 1 year with Dr. Klein.  You will receive a reminder letter in the mail two months in advance. If you don't receive a letter, please call our office to schedule the follow-up appointment.  

## 2014-04-29 NOTE — Progress Notes (Signed)
Patient Care Team: Elby Showers, MD as PCP - General (Internal Medicine) Jacolyn Reedy, MD as Consulting Physician (Cardiology)   HPI  Casey [name] is a 79 y.o. female seen in followup for CHF in the setting of Nonischemic cardiomyopathy for which she is s/p Medtronic CRT implant.  She underwent ICD implantation years ago with by the upgrade about 2005. In 2007 she had failure of her defibrillator lead resulting in inappropriate shocks and requiring lead revision.   She had appropriate therapy for ventricular tachycardia. Cycle length of the ventricular tachycardia was less than 300 ms   She has been feeling well and tolerating her amiodarone.    Surveillance laboratories are being followed by Dr Wynonia Lawman    Her device reached ERI and underwent generator replacement 11/14  Past Medical History  Diagnosis Date  . Nonischemic cardiomyopathy   . Chronic systolic heart failure   . Hypertension   . Biventricular ICD (implantable cardiac defibrillator) in place     medtronic   . VT (ventricular tachycardia)     appropriate therapy via ICD 2013    No past surgical history on file.  Current Outpatient Prescriptions  Medication Sig Dispense Refill  . allopurinol (ZYLOPRIM) 100 MG tablet Take 100 mg by mouth daily as needed (gout).    Marland Kitchen amiodarone (PACERONE) 200 MG tablet Take 200 mg by mouth daily.    . bimatoprost (LUMIGAN) 0.01 % SOLN Place 1 drop into both eyes at bedtime.    . brinzolamide (AZOPT) 1 % ophthalmic suspension Place 1 drop into the right eye 2 (two) times daily.    . Brinzolamide-Brimonidine 1-0.2 % SUSP Place 1 drop into the right eye 2 (two) times daily.    . furosemide (LASIX) 40 MG tablet Take 40 mg by mouth daily.      Marland Kitchen levothyroxine (SYNTHROID, LEVOTHROID) 50 MCG tablet Take 50 mcg by mouth daily before breakfast.    . lisinopril (PRINIVIL,ZESTRIL) 20 MG tablet Take 20 mg by mouth daily.     . metoprolol (LOPRESSOR) 50 MG tablet Take 50 mg  by mouth daily.     . Multiple Vitamins-Calcium (ONE-A-DAY WOMENS FORMULA) TABS Take 1 tablet by mouth daily.      . mupirocin ointment (BACTROBAN) 2 % Place into the nose 2 (two) times daily. 22 g 0  . potassium chloride SA (K-DUR,KLOR-CON) 20 MEQ tablet Take 20 mEq by mouth daily.     . vitamin D, CHOLECALCIFEROL, 400 UNITS tablet Take 800 Units by mouth daily.    Marland Kitchen warfarin (COUMADIN) 3 MG tablet Take 3 mg by mouth daily.     No current facility-administered medications for this visit.    Allergies  Allergen Reactions  . Codeine     Unknown    Review of Systems negative except from HPI and PMH  Physical Exam BP 126/90 mmHg  Pulse 64  Ht 5\' 2"  (1.575 m)  Wt 78.019 kg (172 lb)  BMI 31.45 kg/m2 Well developed and well nourished in no acute distress HENT normal E scleral and icterus clear Neck Supple JVP flat; carotids brisk and full Clear to ausculation Device pocket well healed; without hematoma or erythema.  There is no tethering  Regular rate and rhythm, 2 / 6 murmurs gallops or rub Soft with active bowel sounds No clubbing cyanosis noEdema Alert and oriented, grossly normal motor and sensory function Skin Warm and Dry  ECG demonstrates P. synchronous pacing QRS is negative in lead V1  Chest x-ray demonstrates P-synchronous/ AV  pacing   Assessment and  Plan   nonischemic cardiomyopathy   HFrEF  Venricular tachycardia  No intercurrent Ventricular tachycardia  Defibrillator-CRT The patient's device was interrogated.  The information was reviewed. No changes were made in the programming.     Orthostatic dizziness  she got dizzy and presyncopal and she got ready to leave the office.  This abated with time  Stable from an arrhyhtmic point of view  ,Euvolemic continue current meds

## 2014-04-30 DIAGNOSIS — H4011X1 Primary open-angle glaucoma, mild stage: Secondary | ICD-10-CM | POA: Diagnosis not present

## 2014-04-30 DIAGNOSIS — H4011X2 Primary open-angle glaucoma, moderate stage: Secondary | ICD-10-CM | POA: Diagnosis not present

## 2014-05-05 ENCOUNTER — Encounter: Payer: Self-pay | Admitting: Internal Medicine

## 2014-05-18 DIAGNOSIS — N183 Chronic kidney disease, stage 3 (moderate): Secondary | ICD-10-CM | POA: Diagnosis not present

## 2014-05-18 DIAGNOSIS — Z95810 Presence of automatic (implantable) cardiac defibrillator: Secondary | ICD-10-CM | POA: Diagnosis not present

## 2014-05-18 DIAGNOSIS — Q210 Ventricular septal defect: Secondary | ICD-10-CM | POA: Diagnosis not present

## 2014-05-18 DIAGNOSIS — E039 Hypothyroidism, unspecified: Secondary | ICD-10-CM | POA: Diagnosis not present

## 2014-05-18 DIAGNOSIS — E668 Other obesity: Secondary | ICD-10-CM | POA: Diagnosis not present

## 2014-05-18 DIAGNOSIS — I5022 Chronic systolic (congestive) heart failure: Secondary | ICD-10-CM | POA: Diagnosis not present

## 2014-05-18 DIAGNOSIS — I429 Cardiomyopathy, unspecified: Secondary | ICD-10-CM | POA: Diagnosis not present

## 2014-05-18 DIAGNOSIS — Z7901 Long term (current) use of anticoagulants: Secondary | ICD-10-CM | POA: Diagnosis not present

## 2014-05-18 DIAGNOSIS — I472 Ventricular tachycardia: Secondary | ICD-10-CM | POA: Diagnosis not present

## 2014-05-21 ENCOUNTER — Encounter (HOSPITAL_COMMUNITY): Payer: Self-pay | Admitting: Internal Medicine

## 2014-05-27 DIAGNOSIS — Z7901 Long term (current) use of anticoagulants: Secondary | ICD-10-CM | POA: Diagnosis not present

## 2014-05-27 DIAGNOSIS — N183 Chronic kidney disease, stage 3 (moderate): Secondary | ICD-10-CM | POA: Diagnosis not present

## 2014-05-27 DIAGNOSIS — I429 Cardiomyopathy, unspecified: Secondary | ICD-10-CM | POA: Diagnosis not present

## 2014-05-27 DIAGNOSIS — I5022 Chronic systolic (congestive) heart failure: Secondary | ICD-10-CM | POA: Diagnosis not present

## 2014-05-27 DIAGNOSIS — Z95810 Presence of automatic (implantable) cardiac defibrillator: Secondary | ICD-10-CM | POA: Diagnosis not present

## 2014-05-27 DIAGNOSIS — E039 Hypothyroidism, unspecified: Secondary | ICD-10-CM | POA: Diagnosis not present

## 2014-05-27 DIAGNOSIS — I472 Ventricular tachycardia: Secondary | ICD-10-CM | POA: Diagnosis not present

## 2014-05-27 DIAGNOSIS — E668 Other obesity: Secondary | ICD-10-CM | POA: Diagnosis not present

## 2014-06-01 ENCOUNTER — Other Ambulatory Visit: Payer: Medicare Other | Admitting: Internal Medicine

## 2014-06-01 DIAGNOSIS — N189 Chronic kidney disease, unspecified: Secondary | ICD-10-CM

## 2014-06-01 DIAGNOSIS — Z13220 Encounter for screening for lipoid disorders: Secondary | ICD-10-CM

## 2014-06-01 DIAGNOSIS — E039 Hypothyroidism, unspecified: Secondary | ICD-10-CM | POA: Diagnosis not present

## 2014-06-01 DIAGNOSIS — Z1321 Encounter for screening for nutritional disorder: Secondary | ICD-10-CM

## 2014-06-01 DIAGNOSIS — Z0000 Encounter for general adult medical examination without abnormal findings: Secondary | ICD-10-CM | POA: Diagnosis not present

## 2014-06-01 DIAGNOSIS — Z1329 Encounter for screening for other suspected endocrine disorder: Secondary | ICD-10-CM

## 2014-06-01 DIAGNOSIS — E559 Vitamin D deficiency, unspecified: Secondary | ICD-10-CM

## 2014-06-01 DIAGNOSIS — Z7901 Long term (current) use of anticoagulants: Secondary | ICD-10-CM

## 2014-06-01 DIAGNOSIS — Z130 Encounter for screening for diseases of the blood and blood-forming organs and certain disorders involving the immune mechanism: Secondary | ICD-10-CM

## 2014-06-01 DIAGNOSIS — E785 Hyperlipidemia, unspecified: Secondary | ICD-10-CM

## 2014-06-01 LAB — CBC WITH DIFFERENTIAL/PLATELET
BASOS ABS: 0 10*3/uL (ref 0.0–0.1)
BASOS PCT: 1 % (ref 0–1)
EOS PCT: 2 % (ref 0–5)
Eosinophils Absolute: 0.1 10*3/uL (ref 0.0–0.7)
HEMATOCRIT: 45 % (ref 36.0–46.0)
Hemoglobin: 14.9 g/dL (ref 12.0–15.0)
Lymphocytes Relative: 25 % (ref 12–46)
Lymphs Abs: 1.2 10*3/uL (ref 0.7–4.0)
MCH: 33.6 pg (ref 26.0–34.0)
MCHC: 33.1 g/dL (ref 30.0–36.0)
MCV: 101.4 fL — AB (ref 78.0–100.0)
MONO ABS: 0.4 10*3/uL (ref 0.1–1.0)
MONOS PCT: 9 % (ref 3–12)
MPV: 12.2 fL (ref 9.4–12.4)
Neutro Abs: 3 10*3/uL (ref 1.7–7.7)
Neutrophils Relative %: 63 % (ref 43–77)
Platelets: 146 10*3/uL — ABNORMAL LOW (ref 150–400)
RBC: 4.44 MIL/uL (ref 3.87–5.11)
RDW: 13.7 % (ref 11.5–15.5)
WBC: 4.8 10*3/uL (ref 4.0–10.5)

## 2014-06-01 LAB — COMPREHENSIVE METABOLIC PANEL
ALT: 29 U/L (ref 0–35)
AST: 26 U/L (ref 0–37)
Albumin: 4.2 g/dL (ref 3.5–5.2)
Alkaline Phosphatase: 126 U/L — ABNORMAL HIGH (ref 39–117)
BUN: 23 mg/dL (ref 6–23)
CO2: 25 meq/L (ref 19–32)
Calcium: 9.8 mg/dL (ref 8.4–10.5)
Chloride: 108 mEq/L (ref 96–112)
Creat: 1.28 mg/dL — ABNORMAL HIGH (ref 0.50–1.10)
Glucose, Bld: 120 mg/dL — ABNORMAL HIGH (ref 70–99)
Potassium: 4.4 mEq/L (ref 3.5–5.3)
Sodium: 147 mEq/L — ABNORMAL HIGH (ref 135–145)
Total Bilirubin: 1.2 mg/dL (ref 0.2–1.2)
Total Protein: 6.9 g/dL (ref 6.0–8.3)

## 2014-06-01 LAB — LIPID PANEL
Cholesterol: 290 mg/dL — ABNORMAL HIGH (ref 0–200)
HDL: 65 mg/dL (ref >39)
LDL Cholesterol: 198 mg/dL — ABNORMAL HIGH (ref 0–99)
Total CHOL/HDL Ratio: 4.5 Ratio
Triglycerides: 133 mg/dL (ref <150)
VLDL: 27 mg/dL (ref 0–40)

## 2014-06-02 ENCOUNTER — Ambulatory Visit (INDEPENDENT_AMBULATORY_CARE_PROVIDER_SITE_OTHER): Payer: Medicare Other | Admitting: Internal Medicine

## 2014-06-02 ENCOUNTER — Encounter: Payer: Self-pay | Admitting: Internal Medicine

## 2014-06-02 VITALS — BP 122/80 | HR 70 | Temp 97.4°F | Wt 173.0 lb

## 2014-06-02 DIAGNOSIS — Z7901 Long term (current) use of anticoagulants: Secondary | ICD-10-CM | POA: Diagnosis not present

## 2014-06-02 DIAGNOSIS — E109 Type 1 diabetes mellitus without complications: Secondary | ICD-10-CM

## 2014-06-02 DIAGNOSIS — H409 Unspecified glaucoma: Secondary | ICD-10-CM | POA: Diagnosis not present

## 2014-06-02 DIAGNOSIS — Z8639 Personal history of other endocrine, nutritional and metabolic disease: Secondary | ICD-10-CM

## 2014-06-02 DIAGNOSIS — I10 Essential (primary) hypertension: Secondary | ICD-10-CM

## 2014-06-02 DIAGNOSIS — Z8659 Personal history of other mental and behavioral disorders: Secondary | ICD-10-CM

## 2014-06-02 DIAGNOSIS — I429 Cardiomyopathy, unspecified: Secondary | ICD-10-CM

## 2014-06-02 DIAGNOSIS — Z0000 Encounter for general adult medical examination without abnormal findings: Secondary | ICD-10-CM

## 2014-06-02 DIAGNOSIS — Z23 Encounter for immunization: Secondary | ICD-10-CM

## 2014-06-02 DIAGNOSIS — Z889 Allergy status to unspecified drugs, medicaments and biological substances status: Secondary | ICD-10-CM

## 2014-06-02 DIAGNOSIS — Z95810 Presence of automatic (implantable) cardiac defibrillator: Secondary | ICD-10-CM | POA: Diagnosis not present

## 2014-06-02 DIAGNOSIS — E785 Hyperlipidemia, unspecified: Secondary | ICD-10-CM

## 2014-06-02 DIAGNOSIS — Z789 Other specified health status: Secondary | ICD-10-CM

## 2014-06-02 DIAGNOSIS — I428 Other cardiomyopathies: Secondary | ICD-10-CM

## 2014-06-02 LAB — TSH: TSH: 2.819 u[IU]/mL (ref 0.350–4.500)

## 2014-06-02 LAB — POCT URINALYSIS DIPSTICK
Bilirubin, UA: NEGATIVE
Glucose, UA: NEGATIVE
Ketones, UA: NEGATIVE
Leukocytes, UA: NEGATIVE
NITRITE UA: NEGATIVE
Protein, UA: NEGATIVE
RBC UA: NEGATIVE
Spec Grav, UA: 1.01
UROBILINOGEN UA: NEGATIVE
pH, UA: 6.5

## 2014-06-03 LAB — MICROALBUMIN / CREATININE URINE RATIO
CREATININE, URINE: 178.4 mg/dL
Microalb Creat Ratio: 2.2 mg/g (ref 0.0–30.0)
Microalb, Ur: 0.4 mg/dL (ref <2.0)

## 2014-06-24 DIAGNOSIS — I472 Ventricular tachycardia: Secondary | ICD-10-CM | POA: Diagnosis not present

## 2014-06-24 DIAGNOSIS — N183 Chronic kidney disease, stage 3 (moderate): Secondary | ICD-10-CM | POA: Diagnosis not present

## 2014-06-24 DIAGNOSIS — I429 Cardiomyopathy, unspecified: Secondary | ICD-10-CM | POA: Diagnosis not present

## 2014-06-24 DIAGNOSIS — Z7901 Long term (current) use of anticoagulants: Secondary | ICD-10-CM | POA: Diagnosis not present

## 2014-06-24 DIAGNOSIS — E668 Other obesity: Secondary | ICD-10-CM | POA: Diagnosis not present

## 2014-06-24 DIAGNOSIS — Z95810 Presence of automatic (implantable) cardiac defibrillator: Secondary | ICD-10-CM | POA: Diagnosis not present

## 2014-06-24 DIAGNOSIS — E039 Hypothyroidism, unspecified: Secondary | ICD-10-CM | POA: Diagnosis not present

## 2014-06-24 DIAGNOSIS — I5022 Chronic systolic (congestive) heart failure: Secondary | ICD-10-CM | POA: Diagnosis not present

## 2014-07-16 DIAGNOSIS — H4011X2 Primary open-angle glaucoma, moderate stage: Secondary | ICD-10-CM | POA: Diagnosis not present

## 2014-07-16 DIAGNOSIS — H4011X1 Primary open-angle glaucoma, mild stage: Secondary | ICD-10-CM | POA: Diagnosis not present

## 2014-07-22 DIAGNOSIS — E668 Other obesity: Secondary | ICD-10-CM | POA: Diagnosis not present

## 2014-07-22 DIAGNOSIS — N183 Chronic kidney disease, stage 3 (moderate): Secondary | ICD-10-CM | POA: Diagnosis not present

## 2014-07-22 DIAGNOSIS — Z7901 Long term (current) use of anticoagulants: Secondary | ICD-10-CM | POA: Diagnosis not present

## 2014-07-22 DIAGNOSIS — Z95810 Presence of automatic (implantable) cardiac defibrillator: Secondary | ICD-10-CM | POA: Diagnosis not present

## 2014-07-22 DIAGNOSIS — E039 Hypothyroidism, unspecified: Secondary | ICD-10-CM | POA: Diagnosis not present

## 2014-07-22 DIAGNOSIS — I5022 Chronic systolic (congestive) heart failure: Secondary | ICD-10-CM | POA: Diagnosis not present

## 2014-07-22 DIAGNOSIS — I429 Cardiomyopathy, unspecified: Secondary | ICD-10-CM | POA: Diagnosis not present

## 2014-07-22 DIAGNOSIS — I472 Ventricular tachycardia: Secondary | ICD-10-CM | POA: Diagnosis not present

## 2014-08-18 DIAGNOSIS — I472 Ventricular tachycardia: Secondary | ICD-10-CM | POA: Diagnosis not present

## 2014-08-18 DIAGNOSIS — N183 Chronic kidney disease, stage 3 (moderate): Secondary | ICD-10-CM | POA: Diagnosis not present

## 2014-08-18 DIAGNOSIS — Z7901 Long term (current) use of anticoagulants: Secondary | ICD-10-CM | POA: Diagnosis not present

## 2014-08-18 DIAGNOSIS — Z95810 Presence of automatic (implantable) cardiac defibrillator: Secondary | ICD-10-CM | POA: Diagnosis not present

## 2014-08-18 DIAGNOSIS — E668 Other obesity: Secondary | ICD-10-CM | POA: Diagnosis not present

## 2014-08-18 DIAGNOSIS — E039 Hypothyroidism, unspecified: Secondary | ICD-10-CM | POA: Diagnosis not present

## 2014-08-18 DIAGNOSIS — I5022 Chronic systolic (congestive) heart failure: Secondary | ICD-10-CM | POA: Diagnosis not present

## 2014-08-18 DIAGNOSIS — Q210 Ventricular septal defect: Secondary | ICD-10-CM | POA: Diagnosis not present

## 2014-08-18 DIAGNOSIS — I429 Cardiomyopathy, unspecified: Secondary | ICD-10-CM | POA: Diagnosis not present

## 2014-08-19 DIAGNOSIS — Z7901 Long term (current) use of anticoagulants: Secondary | ICD-10-CM | POA: Diagnosis not present

## 2014-08-19 DIAGNOSIS — Z95810 Presence of automatic (implantable) cardiac defibrillator: Secondary | ICD-10-CM | POA: Diagnosis not present

## 2014-08-19 DIAGNOSIS — I429 Cardiomyopathy, unspecified: Secondary | ICD-10-CM | POA: Diagnosis not present

## 2014-08-19 DIAGNOSIS — I5022 Chronic systolic (congestive) heart failure: Secondary | ICD-10-CM | POA: Diagnosis not present

## 2014-08-19 DIAGNOSIS — E668 Other obesity: Secondary | ICD-10-CM | POA: Diagnosis not present

## 2014-08-19 DIAGNOSIS — N183 Chronic kidney disease, stage 3 (moderate): Secondary | ICD-10-CM | POA: Diagnosis not present

## 2014-08-19 DIAGNOSIS — I472 Ventricular tachycardia: Secondary | ICD-10-CM | POA: Diagnosis not present

## 2014-08-19 DIAGNOSIS — E039 Hypothyroidism, unspecified: Secondary | ICD-10-CM | POA: Diagnosis not present

## 2014-09-03 ENCOUNTER — Other Ambulatory Visit: Payer: Self-pay

## 2014-09-03 DIAGNOSIS — Z1239 Encounter for other screening for malignant neoplasm of breast: Secondary | ICD-10-CM

## 2014-09-10 ENCOUNTER — Encounter: Payer: Self-pay | Admitting: Internal Medicine

## 2014-09-10 NOTE — Patient Instructions (Addendum)
Continue same medications and return in 6 months. Have annual mammogram.

## 2014-09-10 NOTE — Progress Notes (Signed)
Subjective:    Patient ID: Casey [name], female    DOB: October 08, 1934, 80 y.o.   MRN: [id number]  HPI  80 year old Black Female in today for health maintenance exam and evaluation of medical issues. She has a history of nonischemic cardiomyopathy with low ejection fraction, implantable defibrillator, hyperlipidemia, hypertension, type 2 diabetes mellitus, chronic Coumadin therapy, glaucoma. Patient is on the care of Dr. Wynonia Lawman and Dr. Caryl Comes, Cardiologists.  Past medical history: Hysterectomy with BSO in February 1990. She took estrogen replacement for a number of years. She had 2 cesarean sections. History of peptic ulcer disease when she was 80 years old. History of spinal stenosis treated conservatively by Dr. Carloyn Manner many years ago but was hospitalized due to severe pain at that time.  Right cataract extraction July 2009. History of ganglion cyst 2 cm in diameter aspirated from right leg by orthopedist.  Insertion of implantable defibrillator 2005. Had new defibrillator placed July 2009.  In December 2010, I incised and drained an infected epidermoid cyst  She took Lexapro for a while during the time her mother was ill but has been able to stop that medication.  Last colonoscopy on file-1999. She has annual influenza immunization. Tetanus vaccine March 2011. Pneumovax immunization March 2011.  Social history: She is married. She is a Agricultural engineer. Completed one year college. Husband is a retired Art gallery manager who has COPD. They have a son and a daughter. Patient does not smoke or consume alcohol.  She has regular eye exams because of history of glaucoma.  Family history: Father died at 57 with history of cancer of the esophagus. Mother with history of diabetes mellitus, CVA, hypertension, MI dementia. One brother died of unknown causes-patient indicates it was a suspicious death but he had a history of heart problems. One sister living but not much contact with her.    Review of Systems    HENT: Negative.   Respiratory: Negative.   Cardiovascular: Negative for chest pain, palpitations and leg swelling.  Gastrointestinal: Negative.   Genitourinary: Negative.   Neurological: Negative.   Hematological: Negative.   Psychiatric/Behavioral: Negative.        Objective:   Physical Exam  Constitutional: She is oriented to person, place, and time. She appears well-developed and well-nourished. No distress.  HENT:  Head: Normocephalic and atraumatic.  Right Ear: External ear normal.  Left Ear: External ear normal.  Mouth/Throat: Oropharynx is clear and moist. No oropharyngeal exudate.  Eyes: Conjunctivae and EOM are normal. Pupils are equal, round, and reactive to light. Right eye exhibits no discharge. Left eye exhibits no discharge. No scleral icterus.  Neck: Neck supple. No JVD present. No thyromegaly present.  Cardiovascular: Normal rate, regular rhythm and intact distal pulses.   Murmur heard. 3/6 systolic ejection murmur which is holosystolic and unchanged  Pulmonary/Chest: Effort normal and breath sounds normal. No respiratory distress. She has no wheezes. She has no rales.  Breasts normal female without masses  Abdominal: Soft. Bowel sounds are normal. She exhibits no distension and no mass. There is no tenderness. There is no rebound and no guarding.  Genitourinary:  Deferred due to age and history of hysterectomy with BSO  Musculoskeletal: Normal range of motion. She exhibits no edema.  Lymphadenopathy:    She has no cervical adenopathy.  Neurological: She is alert and oriented to person, place, and time. She displays normal reflexes. No cranial nerve deficit. Coordination normal.  Skin: Skin is warm and dry. No rash noted. She is not diaphoretic.  Psychiatric:  She has a normal mood and affect. Her behavior is normal. Judgment and thought content normal.  Vitals reviewed.         Assessment & Plan:  Controlled type 2 diabetes mellitus-hemoglobin A1c admitted  with this exam  Hyperlipidemia-statin intolerant. See results. Watch diet  History of vitamin D deficiency-not checked. Needs to be checked in 6 months  Essential hypertension-stable  Glaucoma-followed by ophthalmologist  Anxiety  History of lumbar spinal stenosis-currently asymptomatic  History of ventricular septal defect  History of nonischemic cardiomyopathy  History of implantable cardiac defibrillator  Chronic anticoagulation therapy  Metabolic syndrome  Plan: Continue same medications and return in 6 months. Monitor blood pressure at home.  Subjective:   Patient presents for Medicare Annual/Subsequent preventive examination.  Review Past Medical/Family/Social:   Risk Factors  Current exercise habits: Mostly sedentary Dietary issues discussed: Low fat low carbohydrate  Cardiac risk factors: Diabetes mellitus, hypertension, hyperlipidemia, cardiomyopathy, VSD  Depression Screen  (Note: if answer to either of the following is "Yes", a more complete depression screening is indicated)   Over the past two weeks, have you felt down, depressed or hopeless? No  Over the past two weeks, have you felt little interest or pleasure in doing things? No Have you lost interest or pleasure in daily life? No Do you often feel hopeless? No Do you cry easily over simple problems? No   Activities of Daily Living  In your present state of health, do you have any difficulty performing the following activities?:   Driving? No  Managing money? No  Feeding yourself? No  Getting from bed to chair? No  Climbing a flight of stairs? No  Preparing food and eating?: No  Bathing or showering? No  Getting dressed: No  Getting to the toilet? No  Using the toilet:No  Moving around from place to place: No  In the past year have you fallen or had a near fall?:No  Are you sexually active? No  Do you have more than one partner? No   Hearing Difficulties: No  Do you often ask people to  speak up or repeat themselves? Sometimes Do you experience ringing or noises in your ears? Sometimes Do you have difficulty understanding soft or whispered voices? Sometimes Do you feel that you have a problem with memory? No Do you often misplace items? No    Home Safety:  Do you have a smoke alarm at your residence? Yes Do you have grab bars in the bathroom? No Do you have throw rugs in your house? No   Cognitive Testing  Alert? Yes Normal Appearance?Yes  Oriented to person? Yes Place? Yes  Time? Yes  Recall of three objects? Yes  Can perform simple calculations? Yes  Displays appropriate judgment?Yes  Can read the correct time from a watch face?Yes   List the Names of Other Physician/Practitioners you currently use:  See referral list for the physicians patient is currently seeing.  Dr. Wynonia Lawman- cardiologist  Eye physician   Review of Systems: See above   Objective:     General appearance: Appears stated age and mildly obese  Head: Normocephalic, without obvious abnormality, atraumatic  Eyes: conj clear, EOMi PEERLA  Ears: normal TM's and external ear canals both ears  Nose: Nares normal. Septum midline. Mucosa normal. No drainage or sinus tenderness.  Throat: lips, mucosa, and tongue normal; teeth and gums normal  Neck: no adenopathy, no carotid bruit, no JVD, supple, symmetrical, trachea midline and thyroid not enlarged, symmetric, no tenderness/mass/nodules  No CVA tenderness.  Lungs: clear to auscultation bilaterally  Breasts: normal appearance, no masses or tenderness, implantable defibrillator present Heart: regular rate and rhythm, S1, S2 normal, murmur unchanged Abdomen: soft, non-tender; bowel sounds normal; no masses, no organomegaly  Musculoskeletal: ROM normal in all joints, no crepitus, no deformity, Normal muscle strengthen. Back  is symmetric, no curvature. Skin: Skin color, texture, turgor normal. No rashes or lesions  Lymph nodes: Cervical,  supraclavicular, and axillary nodes normal.  Neurologic: CN 2 -12 Normal, Normal symmetric reflexes. Normal coordination and gait  Psych: Alert & Oriented x 3, Mood appear stable.    Assessment:    Annual wellness medicare exam   Plan:    During the course of the visit the patient was educated and counseled about appropriate screening and preventive services including:   Annual mammogram  Annual eye exam     Patient Instructions (the written plan) was given to the patient.  Medicare Attestation  I have personally reviewed:  The patient's medical and social history  Their use of alcohol, tobacco or illicit drugs  Their current medications and supplements  The patient's functional ability including ADLs,fall risks, home safety risks, cognitive, and hearing and visual impairment  Diet and physical activities  Evidence for depression or mood disorders  The patient's weight, height, BMI, and visual acuity have been recorded in the chart. I have made referrals, counseling, and provided education to the patient based on review of the above and I have provided the patient with a written personalized care plan for preventive services.

## 2014-09-16 DIAGNOSIS — E039 Hypothyroidism, unspecified: Secondary | ICD-10-CM | POA: Diagnosis not present

## 2014-09-16 DIAGNOSIS — I429 Cardiomyopathy, unspecified: Secondary | ICD-10-CM | POA: Diagnosis not present

## 2014-09-16 DIAGNOSIS — I472 Ventricular tachycardia: Secondary | ICD-10-CM | POA: Diagnosis not present

## 2014-09-16 DIAGNOSIS — I5022 Chronic systolic (congestive) heart failure: Secondary | ICD-10-CM | POA: Diagnosis not present

## 2014-09-16 DIAGNOSIS — Z7901 Long term (current) use of anticoagulants: Secondary | ICD-10-CM | POA: Diagnosis not present

## 2014-09-16 DIAGNOSIS — E668 Other obesity: Secondary | ICD-10-CM | POA: Diagnosis not present

## 2014-09-16 DIAGNOSIS — N183 Chronic kidney disease, stage 3 (moderate): Secondary | ICD-10-CM | POA: Diagnosis not present

## 2014-09-16 DIAGNOSIS — Z95810 Presence of automatic (implantable) cardiac defibrillator: Secondary | ICD-10-CM | POA: Diagnosis not present

## 2014-09-29 ENCOUNTER — Ambulatory Visit
Admission: RE | Admit: 2014-09-29 | Discharge: 2014-09-29 | Disposition: A | Discharge status: Home / Self Care | Payer: Medicare Other | Source: Ambulatory Visit

## 2014-09-29 DIAGNOSIS — Z1239 Encounter for other screening for malignant neoplasm of breast: Secondary | ICD-10-CM

## 2014-09-29 DIAGNOSIS — Z1231 Encounter for screening mammogram for malignant neoplasm of breast: Secondary | ICD-10-CM | POA: Diagnosis not present

## 2014-10-14 DIAGNOSIS — E039 Hypothyroidism, unspecified: Secondary | ICD-10-CM | POA: Diagnosis not present

## 2014-10-14 DIAGNOSIS — N183 Chronic kidney disease, stage 3 (moderate): Secondary | ICD-10-CM | POA: Diagnosis not present

## 2014-10-14 DIAGNOSIS — E668 Other obesity: Secondary | ICD-10-CM | POA: Diagnosis not present

## 2014-10-14 DIAGNOSIS — I429 Cardiomyopathy, unspecified: Secondary | ICD-10-CM | POA: Diagnosis not present

## 2014-10-14 DIAGNOSIS — Q210 Ventricular septal defect: Secondary | ICD-10-CM | POA: Diagnosis not present

## 2014-10-14 DIAGNOSIS — I5022 Chronic systolic (congestive) heart failure: Secondary | ICD-10-CM | POA: Diagnosis not present

## 2014-10-14 DIAGNOSIS — Z7901 Long term (current) use of anticoagulants: Secondary | ICD-10-CM | POA: Diagnosis not present

## 2014-10-14 DIAGNOSIS — I472 Ventricular tachycardia: Secondary | ICD-10-CM | POA: Diagnosis not present

## 2014-10-14 DIAGNOSIS — Z95810 Presence of automatic (implantable) cardiac defibrillator: Secondary | ICD-10-CM | POA: Diagnosis not present

## 2014-10-30 ENCOUNTER — Encounter: Payer: Self-pay | Admitting: Internal Medicine

## 2014-11-11 DIAGNOSIS — I472 Ventricular tachycardia: Secondary | ICD-10-CM | POA: Diagnosis not present

## 2014-11-11 DIAGNOSIS — E039 Hypothyroidism, unspecified: Secondary | ICD-10-CM | POA: Diagnosis not present

## 2014-11-11 DIAGNOSIS — Z95810 Presence of automatic (implantable) cardiac defibrillator: Secondary | ICD-10-CM | POA: Diagnosis not present

## 2014-11-11 DIAGNOSIS — N183 Chronic kidney disease, stage 3 (moderate): Secondary | ICD-10-CM | POA: Diagnosis not present

## 2014-11-11 DIAGNOSIS — I429 Cardiomyopathy, unspecified: Secondary | ICD-10-CM | POA: Diagnosis not present

## 2014-11-11 DIAGNOSIS — E668 Other obesity: Secondary | ICD-10-CM | POA: Diagnosis not present

## 2014-11-11 DIAGNOSIS — Z7901 Long term (current) use of anticoagulants: Secondary | ICD-10-CM | POA: Diagnosis not present

## 2014-11-11 DIAGNOSIS — Q210 Ventricular septal defect: Secondary | ICD-10-CM | POA: Diagnosis not present

## 2014-11-11 DIAGNOSIS — I5022 Chronic systolic (congestive) heart failure: Secondary | ICD-10-CM | POA: Diagnosis not present

## 2014-12-09 DIAGNOSIS — Z95810 Presence of automatic (implantable) cardiac defibrillator: Secondary | ICD-10-CM | POA: Diagnosis not present

## 2014-12-09 DIAGNOSIS — Q210 Ventricular septal defect: Secondary | ICD-10-CM | POA: Diagnosis not present

## 2014-12-09 DIAGNOSIS — I5022 Chronic systolic (congestive) heart failure: Secondary | ICD-10-CM | POA: Diagnosis not present

## 2014-12-09 DIAGNOSIS — I472 Ventricular tachycardia: Secondary | ICD-10-CM | POA: Diagnosis not present

## 2014-12-09 DIAGNOSIS — Z7901 Long term (current) use of anticoagulants: Secondary | ICD-10-CM | POA: Diagnosis not present

## 2014-12-09 DIAGNOSIS — E039 Hypothyroidism, unspecified: Secondary | ICD-10-CM | POA: Diagnosis not present

## 2014-12-09 DIAGNOSIS — I429 Cardiomyopathy, unspecified: Secondary | ICD-10-CM | POA: Diagnosis not present

## 2014-12-09 DIAGNOSIS — E668 Other obesity: Secondary | ICD-10-CM | POA: Diagnosis not present

## 2014-12-09 DIAGNOSIS — N183 Chronic kidney disease, stage 3 (moderate): Secondary | ICD-10-CM | POA: Diagnosis not present

## 2014-12-31 DIAGNOSIS — Z7901 Long term (current) use of anticoagulants: Secondary | ICD-10-CM | POA: Diagnosis not present

## 2014-12-31 DIAGNOSIS — Q210 Ventricular septal defect: Secondary | ICD-10-CM | POA: Diagnosis not present

## 2014-12-31 DIAGNOSIS — I472 Ventricular tachycardia: Secondary | ICD-10-CM | POA: Diagnosis not present

## 2014-12-31 DIAGNOSIS — E668 Other obesity: Secondary | ICD-10-CM | POA: Diagnosis not present

## 2014-12-31 DIAGNOSIS — I429 Cardiomyopathy, unspecified: Secondary | ICD-10-CM | POA: Diagnosis not present

## 2014-12-31 DIAGNOSIS — N183 Chronic kidney disease, stage 3 (moderate): Secondary | ICD-10-CM | POA: Diagnosis not present

## 2014-12-31 DIAGNOSIS — Z95810 Presence of automatic (implantable) cardiac defibrillator: Secondary | ICD-10-CM | POA: Diagnosis not present

## 2014-12-31 DIAGNOSIS — I5022 Chronic systolic (congestive) heart failure: Secondary | ICD-10-CM | POA: Diagnosis not present

## 2014-12-31 DIAGNOSIS — E039 Hypothyroidism, unspecified: Secondary | ICD-10-CM | POA: Diagnosis not present

## 2015-01-06 DIAGNOSIS — Q210 Ventricular septal defect: Secondary | ICD-10-CM | POA: Diagnosis not present

## 2015-01-06 DIAGNOSIS — I429 Cardiomyopathy, unspecified: Secondary | ICD-10-CM | POA: Diagnosis not present

## 2015-01-06 DIAGNOSIS — Z7901 Long term (current) use of anticoagulants: Secondary | ICD-10-CM | POA: Diagnosis not present

## 2015-01-06 DIAGNOSIS — I472 Ventricular tachycardia: Secondary | ICD-10-CM | POA: Diagnosis not present

## 2015-01-06 DIAGNOSIS — N183 Chronic kidney disease, stage 3 (moderate): Secondary | ICD-10-CM | POA: Diagnosis not present

## 2015-01-06 DIAGNOSIS — Z95810 Presence of automatic (implantable) cardiac defibrillator: Secondary | ICD-10-CM | POA: Diagnosis not present

## 2015-01-06 DIAGNOSIS — E668 Other obesity: Secondary | ICD-10-CM | POA: Diagnosis not present

## 2015-01-06 DIAGNOSIS — E039 Hypothyroidism, unspecified: Secondary | ICD-10-CM | POA: Diagnosis not present

## 2015-01-06 DIAGNOSIS — I5022 Chronic systolic (congestive) heart failure: Secondary | ICD-10-CM | POA: Diagnosis not present

## 2015-02-03 DIAGNOSIS — I429 Cardiomyopathy, unspecified: Secondary | ICD-10-CM | POA: Diagnosis not present

## 2015-02-03 DIAGNOSIS — N183 Chronic kidney disease, stage 3 (moderate): Secondary | ICD-10-CM | POA: Diagnosis not present

## 2015-02-03 DIAGNOSIS — Z7901 Long term (current) use of anticoagulants: Secondary | ICD-10-CM | POA: Diagnosis not present

## 2015-02-03 DIAGNOSIS — I472 Ventricular tachycardia: Secondary | ICD-10-CM | POA: Diagnosis not present

## 2015-02-03 DIAGNOSIS — I5022 Chronic systolic (congestive) heart failure: Secondary | ICD-10-CM | POA: Diagnosis not present

## 2015-02-03 DIAGNOSIS — E668 Other obesity: Secondary | ICD-10-CM | POA: Diagnosis not present

## 2015-02-03 DIAGNOSIS — Z95810 Presence of automatic (implantable) cardiac defibrillator: Secondary | ICD-10-CM | POA: Diagnosis not present

## 2015-02-03 DIAGNOSIS — E039 Hypothyroidism, unspecified: Secondary | ICD-10-CM | POA: Diagnosis not present

## 2015-02-03 DIAGNOSIS — Q210 Ventricular septal defect: Secondary | ICD-10-CM | POA: Diagnosis not present

## 2015-02-18 ENCOUNTER — Encounter: Payer: Self-pay | Admitting: Internal Medicine

## 2015-02-18 ENCOUNTER — Ambulatory Visit (INDEPENDENT_AMBULATORY_CARE_PROVIDER_SITE_OTHER): Payer: Medicare Other | Admitting: Internal Medicine

## 2015-02-18 VITALS — BP 110/64 | HR 71 | Temp 97.9°F | Ht 62.0 in | Wt 165.0 lb

## 2015-02-18 DIAGNOSIS — I10 Essential (primary) hypertension: Secondary | ICD-10-CM

## 2015-02-18 DIAGNOSIS — I429 Cardiomyopathy, unspecified: Secondary | ICD-10-CM

## 2015-02-18 DIAGNOSIS — H8110 Benign paroxysmal vertigo, unspecified ear: Secondary | ICD-10-CM | POA: Diagnosis not present

## 2015-02-18 DIAGNOSIS — Z23 Encounter for immunization: Secondary | ICD-10-CM | POA: Diagnosis not present

## 2015-02-18 DIAGNOSIS — I428 Other cardiomyopathies: Secondary | ICD-10-CM | POA: Diagnosis not present

## 2015-02-18 MED ORDER — MECLIZINE HCL 25 MG PO TABS: 25.0000 mg | ORAL_TABLET | Freq: Three times a day (TID) | ORAL | Status: DC | PRN

## 2015-02-18 NOTE — Patient Instructions (Addendum)
Take Antivert as needed for dizziness. Return as needed.

## 2015-02-18 NOTE — Progress Notes (Signed)
   Subjective:    Patient ID: Casey [name], female    DOB: 1934-10-04, 80 y.o.   MRN: [id number]  HPI  Had vertigo mid 1970's and had recent episode of vertigo. No URI or allergy symptoms. Husband sick with COPD and hip arthritis. Husband will not see doctor. Pt. under stress with husband. Husband forgetful and has home O2.  No sore throat, fever or chills. Onset room spinning  vertigo nearly one week ago. BP was stable. Lasted about 15 minutes. Slowly improving.    Review of Systems see above     Objective:   Physical Exam  Constitutional: She is oriented to person, place, and time.  HENT:  Head: Normocephalic and atraumatic.  Right Ear: External ear normal.  Left Ear: External ear normal.  Mouth/Throat: Oropharynx is clear and moist.  Eyes: Pupils are equal, round, and reactive to light. No scleral icterus.  Neck: Neck supple. No JVD present.  Cardiovascular: Normal rate.   Unchanged from previous exam  Pulmonary/Chest: She has no wheezes. She has no rales.  Musculoskeletal: She exhibits no edema.  Lymphadenopathy:    She has no cervical adenopathy.  Neurological: She is alert and oriented to person, place, and time. She displays normal reflexes. No cranial nerve deficit. Coordination normal.  Skin: Skin is warm and dry.  Psychiatric: She has a normal mood and affect. Her behavior is normal. Judgment and thought content normal.          Assessment & Plan:  Benign positional vertigo  Essential hypertension  Ischemic cardiomyopathy  Plan: Take meclizine as needed for dizziness. Call us symptoms return. Patient reassured.

## 2015-03-01 DIAGNOSIS — H4011X2 Primary open-angle glaucoma, moderate stage: Secondary | ICD-10-CM | POA: Diagnosis not present

## 2015-03-03 DIAGNOSIS — Q210 Ventricular septal defect: Secondary | ICD-10-CM | POA: Diagnosis not present

## 2015-03-03 DIAGNOSIS — I472 Ventricular tachycardia: Secondary | ICD-10-CM | POA: Diagnosis not present

## 2015-03-03 DIAGNOSIS — I5022 Chronic systolic (congestive) heart failure: Secondary | ICD-10-CM | POA: Diagnosis not present

## 2015-03-03 DIAGNOSIS — E039 Hypothyroidism, unspecified: Secondary | ICD-10-CM | POA: Diagnosis not present

## 2015-03-03 DIAGNOSIS — I429 Cardiomyopathy, unspecified: Secondary | ICD-10-CM | POA: Diagnosis not present

## 2015-03-03 DIAGNOSIS — Z95810 Presence of automatic (implantable) cardiac defibrillator: Secondary | ICD-10-CM | POA: Diagnosis not present

## 2015-03-03 DIAGNOSIS — Z7901 Long term (current) use of anticoagulants: Secondary | ICD-10-CM | POA: Diagnosis not present

## 2015-03-03 DIAGNOSIS — N183 Chronic kidney disease, stage 3 (moderate): Secondary | ICD-10-CM | POA: Diagnosis not present

## 2015-03-03 DIAGNOSIS — E668 Other obesity: Secondary | ICD-10-CM | POA: Diagnosis not present

## 2015-03-08 DIAGNOSIS — H4011X1 Primary open-angle glaucoma, mild stage: Secondary | ICD-10-CM | POA: Diagnosis not present

## 2015-03-31 DIAGNOSIS — Q210 Ventricular septal defect: Secondary | ICD-10-CM | POA: Diagnosis not present

## 2015-03-31 DIAGNOSIS — I5022 Chronic systolic (congestive) heart failure: Secondary | ICD-10-CM | POA: Diagnosis not present

## 2015-03-31 DIAGNOSIS — Z7901 Long term (current) use of anticoagulants: Secondary | ICD-10-CM | POA: Diagnosis not present

## 2015-03-31 DIAGNOSIS — E668 Other obesity: Secondary | ICD-10-CM | POA: Diagnosis not present

## 2015-03-31 DIAGNOSIS — Z95810 Presence of automatic (implantable) cardiac defibrillator: Secondary | ICD-10-CM | POA: Diagnosis not present

## 2015-03-31 DIAGNOSIS — I472 Ventricular tachycardia: Secondary | ICD-10-CM | POA: Diagnosis not present

## 2015-03-31 DIAGNOSIS — E039 Hypothyroidism, unspecified: Secondary | ICD-10-CM | POA: Diagnosis not present

## 2015-03-31 DIAGNOSIS — I429 Cardiomyopathy, unspecified: Secondary | ICD-10-CM | POA: Diagnosis not present

## 2015-03-31 DIAGNOSIS — N183 Chronic kidney disease, stage 3 (moderate): Secondary | ICD-10-CM | POA: Diagnosis not present

## 2015-04-01 DIAGNOSIS — I429 Cardiomyopathy, unspecified: Secondary | ICD-10-CM | POA: Diagnosis not present

## 2015-04-01 DIAGNOSIS — E668 Other obesity: Secondary | ICD-10-CM | POA: Diagnosis not present

## 2015-04-01 DIAGNOSIS — E039 Hypothyroidism, unspecified: Secondary | ICD-10-CM | POA: Diagnosis not present

## 2015-04-01 DIAGNOSIS — I5022 Chronic systolic (congestive) heart failure: Secondary | ICD-10-CM | POA: Diagnosis not present

## 2015-04-01 DIAGNOSIS — I472 Ventricular tachycardia: Secondary | ICD-10-CM | POA: Diagnosis not present

## 2015-04-01 DIAGNOSIS — Z95810 Presence of automatic (implantable) cardiac defibrillator: Secondary | ICD-10-CM | POA: Diagnosis not present

## 2015-04-01 DIAGNOSIS — Q210 Ventricular septal defect: Secondary | ICD-10-CM | POA: Diagnosis not present

## 2015-04-01 DIAGNOSIS — N183 Chronic kidney disease, stage 3 (moderate): Secondary | ICD-10-CM | POA: Diagnosis not present

## 2015-04-01 DIAGNOSIS — Z7901 Long term (current) use of anticoagulants: Secondary | ICD-10-CM | POA: Diagnosis not present

## 2015-04-13 ENCOUNTER — Encounter: Payer: Self-pay | Admitting: Cardiology

## 2015-04-13 DIAGNOSIS — Z95810 Presence of automatic (implantable) cardiac defibrillator: Secondary | ICD-10-CM | POA: Diagnosis not present

## 2015-04-13 DIAGNOSIS — I5022 Chronic systolic (congestive) heart failure: Secondary | ICD-10-CM | POA: Diagnosis not present

## 2015-04-13 DIAGNOSIS — I429 Cardiomyopathy, unspecified: Secondary | ICD-10-CM | POA: Diagnosis not present

## 2015-04-13 DIAGNOSIS — Q210 Ventricular septal defect: Secondary | ICD-10-CM | POA: Diagnosis not present

## 2015-04-13 DIAGNOSIS — E039 Hypothyroidism, unspecified: Secondary | ICD-10-CM | POA: Diagnosis not present

## 2015-04-13 DIAGNOSIS — Z7901 Long term (current) use of anticoagulants: Secondary | ICD-10-CM | POA: Diagnosis not present

## 2015-04-13 DIAGNOSIS — I472 Ventricular tachycardia: Secondary | ICD-10-CM | POA: Diagnosis not present

## 2015-04-13 DIAGNOSIS — N183 Chronic kidney disease, stage 3 (moderate): Secondary | ICD-10-CM | POA: Diagnosis not present

## 2015-04-13 DIAGNOSIS — E668 Other obesity: Secondary | ICD-10-CM | POA: Diagnosis not present

## 2015-04-13 NOTE — Progress Notes (Signed)
Patient ID: Casey [name], female   DOB: 03/26/1935, 80 y.o.   MRN: [id number]  Casey, [name]  Date of visit:  04/13/2015 DOB:  Jun 08, 1935    Age:  80 yrs. Medical record number:  [id number]     Account number:  [id number] Primary Care Provider: [facility] ____________________________ CURRENT DIAGNOSES  1. Cardiomyopathy, unspecified  2. Chronic systolic (congestive) heart failure  3. Ventricular tachycardia  4. Other obesity  5. Long term (current) use of anticoagulants  6. Chronic kidney disease, stage 3 (moderate)  7. Ventricular septal defect  8. Hypothyroidism  9. Presence of automatic (implantable) cardiac defibrillator ____________________________ ALLERGIES  Candesartan, Severe nausea & vomiting  Codeine, Rash  Hydralazine, Lethargy  Isosorbide, Lethargy ____________________________ MEDICATIONS  1. Vitamin D3 400 unit Capsule, 1 p.o. daily  2. allopurinol 100 mg tablet, 1 p.o. daily  3. multivitamin tablet, 1 p.o. daily  4. Proventil HFA 90 mcg/actuation aerosol inhaler, PRN  5. dorzolamide 2 % eye drops, 1 gtt od bid  6. Lumigan 0.01 % eye drops, 1 gtt ou qhs  7. Alphagan P 0.1 % eye drops, 1 gtt od bid  8. Klor-Con M20 mEq tablet,extended release, 1 p.o. q.d.  9. lisinopril 20 mg tablet, BID  10. furosemide 40 mg tablet, BID  11. amiodarone 200 mg tablet, 1 p.o. daily  12. metoprolol succinate ER 50 mg tablet,extended release 24 hr, 1 p.o. daily  13. warfarin 3 mg tablet, one tablet Monday, Wednesday, Friday or as directed  14. levothyroxine 50 mcg tablet, 1 p.o. daily  15. warfarin 2.5 mg tablet, 1 daily or as directed  16. meclizine 25 mg tablet, PRN ____________________________ CHIEF COMPLAINTS  Followup of Cardiomyopathy, unspecified  Followup of Chronic systolic (congestive) heart failure ____________________________ HISTORY OF PRESENT ILLNESS Patient seen for cardiac followup. She has been doing well since she was previously here. She  doesn't have any shortness of breath or chest pain and has had no defibrillator discharges. She has not had recurrent vertigo. She denies any bleeding complications from anticoagulation. She is under a great deal of situational stress with her husband and we discussed this extensively today. She is tolerating amiodarone without any complications. Lab was reviewed from last December. She has not had a chest x-ray this year. ____________________________ PAST HISTORY  Past Medical Illnesses:  history of peptic ulcer disease, lumbar disc disease, left arm  DVT 12/07, DM-diet controlled, gout, hypothyroidism, chronic kidney disease Stage 3;  Cardiovascular Illnesses:  cardiomyopathy(idiopathic), CHF, insertion of biventricular defibrillator 6/05, ventricular tachycardia;  Surgical Procedures:  cesarean section, hysterectomy, pin rt femur, Medtronic AICD implant June 2005;  NYHA Classification:  II;  Canadian Angina Classification:  Class 0: Asymptomatic;  Cardiology Procedures-Invasive:  cardiac cath (left) May 2005, replacement of fractured defibrillator lead 11/07, Medtronic generator change July 2009, Medtronic generator change November 2014;  Cardiology Procedures-Noninvasive:  echocardiogram November 2009, echocardiogram June 2011, echocardiogram June 2013, echocardiogram January 2015;  Cardiac Cath Results:  normal coronary arteries, global hypokinesis;  LVEF of 20% documented via echocardiogram on 07/09/2013,   ____________________________ CARDIO-PULMONARY TEST DATES EKG Date:  10/14/2014;   Cardiac Cath Date:  11/02/2003;  Echocardiography Date: 07/09/2013;  Chest Xray Date: 07/09/2013;   ____________________________ FAMILY HISTORY Brother -- Brother dead Father -- Father dead, Ischemic heart disease at greater than 39 years Mother -- Mother dead, Myocardial infarction at greater than 108 Sister -- Sister dead ____________________________ SOCIAL HISTORY Alcohol Use:  no alcohol use;  Smoking:  used  to smoke but  quit;  Diet:  regular diet;  Lifestyle:  married, 1 daughter and 1 son;  Exercise:  no regular exercise;  Occupation:  retired and Surveyor, minerals;  Residence:  lives with husband;   ____________________________ REVIEW OF SYSTEMS General:  malaise and fatigue, no change in weight Eyes: wears eye glasses/contact lenses, decreased acuity O.D. Respiratory: denies dyspnea, cough, wheezing or hemoptysis. Cardiovascular:  please review HPI Abdominal: denies dyspepsia, GI bleeding, constipation, or diarrhea Musculoskeletal:  chronic low back pain Neurological:  occasional vertigo Psychiatric:  situational stress  ____________________________ PHYSICAL EXAMINATION VITAL SIGNS  Blood Pressure:  110/70 Sitting, Left arm, regular cuff  , 114/72 Standing, Left arm and regular cuff   Pulse:  72/min. Weight:  168.00 lbs. Height:  62"BMI: 30  Constitutional:  pleasant African American female in no acute distress, mildly obese Skin:  warm and dry to touch, no apparent skin lesions, or masses noted. Head:  normocephalic, normal hair pattern, no masses or tenderness ENT:  ears, nose and throat reveal no gross abnormalities.  Dentition good. Neck:  supple, no masses, thyromegaly, JVD. Carotid pulses are full and equal bilaterally without bruits. Chest:  clear to auscultation, normal A-P diameter, healed ICD incision in the left pectoral area Cardiac:  S1,S2, no S3 or S4 present, regular rhythm, grade 2/6 early systolic murmur left sternal border Peripheral Pulses:  pulses full and equal in all extremities Extremities & Back:  trace edema, no spinal abnormalities noted. Neurological:  no gross motor or sensory deficits noted, affect appropriate, oriented x3. ____________________________ MOST RECENT LIPID PANEL 06/01/14  CHOL TOTL 290 mg/dl, LDL 198 NM, HDL 65 mg/dl, TRIGLYCER 133 mg/dl, ALT 29 u/l, ALK PHOS 126 u/l, CHOL/HDL 4.5 (Calc) and AST 26 u/l ____________________________ IMPRESSIONS/PLAN  1.  Nonischemic cardiomyopathy with no symptoms 2. Functioning implantable defibrillator 3. History of ventricular tachycardia on amiodarone 4. Long-term anticoagulation with warfarin  Recommendations:  Lab was reviewed from last December. She will need to have additional TSH and liver tests as well as a chest x-ray. Followup in 6 months with echocardiogram at that time. ____________________________ TODAYS ORDERS  1. 2D, color flow, doppler: 6 months  2. Return Visit: 6 months  3. 12 Lead EKG: 6 months                       ____________________________ Cardiology Physician:  Kerry Hough MD [facility]

## 2015-04-15 ENCOUNTER — Other Ambulatory Visit: Payer: Self-pay | Admitting: Cardiology

## 2015-04-15 ENCOUNTER — Ambulatory Visit
Admission: RE | Admit: 2015-04-15 | Discharge: 2015-04-15 | Disposition: A | Discharge status: Home / Self Care | Payer: Medicare Other | Source: Ambulatory Visit | Attending: Cardiology | Admitting: Cardiology

## 2015-04-15 DIAGNOSIS — I5022 Chronic systolic (congestive) heart failure: Secondary | ICD-10-CM

## 2015-04-15 DIAGNOSIS — Z95810 Presence of automatic (implantable) cardiac defibrillator: Secondary | ICD-10-CM | POA: Diagnosis not present

## 2015-04-22 ENCOUNTER — Other Ambulatory Visit: Payer: Self-pay

## 2015-04-22 MED ORDER — ALLOPURINOL 100 MG PO TABS: 100.0000 mg | ORAL_TABLET | Freq: Every day | ORAL | Status: DC | PRN

## 2015-04-22 NOTE — Telephone Encounter (Signed)
Refill request received from [facility] on 04/22/15

## 2015-04-28 DIAGNOSIS — N183 Chronic kidney disease, stage 3 (moderate): Secondary | ICD-10-CM | POA: Diagnosis not present

## 2015-04-28 DIAGNOSIS — I472 Ventricular tachycardia: Secondary | ICD-10-CM | POA: Diagnosis not present

## 2015-04-28 DIAGNOSIS — E668 Other obesity: Secondary | ICD-10-CM | POA: Diagnosis not present

## 2015-04-28 DIAGNOSIS — I429 Cardiomyopathy, unspecified: Secondary | ICD-10-CM | POA: Diagnosis not present

## 2015-04-28 DIAGNOSIS — Q210 Ventricular septal defect: Secondary | ICD-10-CM | POA: Diagnosis not present

## 2015-04-28 DIAGNOSIS — I5022 Chronic systolic (congestive) heart failure: Secondary | ICD-10-CM | POA: Diagnosis not present

## 2015-04-28 DIAGNOSIS — E039 Hypothyroidism, unspecified: Secondary | ICD-10-CM | POA: Diagnosis not present

## 2015-04-28 DIAGNOSIS — Z7901 Long term (current) use of anticoagulants: Secondary | ICD-10-CM | POA: Diagnosis not present

## 2015-04-28 DIAGNOSIS — Z95810 Presence of automatic (implantable) cardiac defibrillator: Secondary | ICD-10-CM | POA: Diagnosis not present

## 2015-04-29 DIAGNOSIS — H401112 Primary open-angle glaucoma, right eye, moderate stage: Secondary | ICD-10-CM | POA: Diagnosis not present

## 2015-04-29 DIAGNOSIS — H524 Presbyopia: Secondary | ICD-10-CM | POA: Diagnosis not present

## 2015-04-29 DIAGNOSIS — H401121 Primary open-angle glaucoma, left eye, mild stage: Secondary | ICD-10-CM | POA: Diagnosis not present

## 2015-04-29 DIAGNOSIS — H02109 Unspecified ectropion of unspecified eye, unspecified eyelid: Secondary | ICD-10-CM | POA: Diagnosis not present

## 2015-05-26 ENCOUNTER — Ambulatory Visit (INDEPENDENT_AMBULATORY_CARE_PROVIDER_SITE_OTHER): Payer: Medicare Other | Admitting: Internal Medicine

## 2015-05-26 ENCOUNTER — Encounter: Payer: Self-pay | Admitting: Internal Medicine

## 2015-05-26 VITALS — BP 120/86 | HR 68 | Ht 62.0 in | Wt 168.2 lb

## 2015-05-26 DIAGNOSIS — Z95810 Presence of automatic (implantable) cardiac defibrillator: Secondary | ICD-10-CM | POA: Diagnosis not present

## 2015-05-26 DIAGNOSIS — I5022 Chronic systolic (congestive) heart failure: Secondary | ICD-10-CM | POA: Diagnosis not present

## 2015-05-26 DIAGNOSIS — I429 Cardiomyopathy, unspecified: Secondary | ICD-10-CM | POA: Diagnosis not present

## 2015-05-26 DIAGNOSIS — I428 Other cardiomyopathies: Secondary | ICD-10-CM

## 2015-05-26 LAB — CUP PACEART INCLINIC DEVICE CHECK
Battery Voltage: 2.99 V
Brady Statistic AP VP Percent: 8.31 %
Brady Statistic AS VP Percent: 90.15 %
Brady Statistic RA Percent Paced: 8.48 %
Brady Statistic RV Percent Paced: 0.16 %
HighPow Impedance: 40 Ohm
HighPow Impedance: 54 Ohm
Implantable Lead Implant Date: 20050525
Implantable Lead Implant Date: 20071119
Implantable Lead Location: 753859
Implantable Lead Location: 753860
Implantable Lead Model: 5076
Lead Channel Impedance Value: 4047 Ohm
Lead Channel Impedance Value: 4047 Ohm
Lead Channel Impedance Value: 475 Ohm
Lead Channel Pacing Threshold Amplitude: 1.125 V
Lead Channel Pacing Threshold Pulse Width: 0.4 ms
Lead Channel Pacing Threshold Pulse Width: 0.6 ms
Lead Channel Sensing Intrinsic Amplitude: 2.5 mV
Lead Channel Sensing Intrinsic Amplitude: 2.75 mV
Lead Channel Sensing Intrinsic Amplitude: 5 mV
Lead Channel Setting Pacing Amplitude: 2.75 V
Lead Channel Setting Sensing Sensitivity: 0.3 mV
MDC IDC LEAD IMPLANT DT: 20050525
MDC IDC LEAD LOCATION: 753858
MDC IDC MSMT BATTERY REMAINING LONGEVITY: 73 mo
MDC IDC MSMT LEADCHNL LV IMPEDANCE VALUE: 646 Ohm
MDC IDC MSMT LEADCHNL LV PACING THRESHOLD AMPLITUDE: 1.875 V
MDC IDC MSMT LEADCHNL RV IMPEDANCE VALUE: 304 Ohm
MDC IDC MSMT LEADCHNL RV IMPEDANCE VALUE: 399 Ohm
MDC IDC MSMT LEADCHNL RV PACING THRESHOLD AMPLITUDE: 0.5 V
MDC IDC MSMT LEADCHNL RV PACING THRESHOLD PULSEWIDTH: 0.4 ms
MDC IDC MSMT LEADCHNL RV SENSING INTR AMPL: 6.5 mV
MDC IDC SESS DTM: 20161214161409
MDC IDC SET LEADCHNL LV PACING PULSEWIDTH: 0.6 ms
MDC IDC SET LEADCHNL RA PACING AMPLITUDE: 2.75 V
MDC IDC STAT BRADY AP VS PERCENT: 0.17 %
MDC IDC STAT BRADY AS VS PERCENT: 1.38 %

## 2015-05-26 NOTE — Patient Instructions (Addendum)
Medication Instructions: - no changes  Labwork: - none  Procedures/Testing: - none  Follow-Up: - Your physician wants you to follow-up in: 1 year with Chanetta Marshall, NP. You will receive a reminder letter in the mail two months in advance. If you don't receive a letter, please call our office to schedule the follow-up appointment.  Any Additional Special Instructions Will Be Listed Below (If Applicable).

## 2015-05-26 NOTE — Progress Notes (Signed)
Patient Care Team: Elby Showers, MD as PCP - General (Internal Medicine) Jacolyn Reedy, MD as Consulting Physician (Cardiology)   HPI  Casey [name] is a 80 y.o. female seen in followup for CHF in the setting of Nonischemic cardiomyopathy for which she is s/p Medtronic CRT implant.  She underwent ICD implantation years ago with by the upgrade about 2005. In 2007 she had failure of her defibrillator lead resulting in inappropriate shocks and requiring lead revision. In 2014 she underwent generator replacement  She had appropriate therapy for ventricular tachycardia. Cycle length of the ventricular tachycardia was less than 300 ms   She has been feeling well and tolerating her amiodarone.    Surveillance laboratories are being followed by Dr Wynonia Lawman      Past Medical History  Diagnosis Date  . Nonischemic cardiomyopathy ([place])   . Chronic systolic heart failure   . Hypertension   . Biventricular ICD (implantable cardiac defibrillator) in place     medtronic   . VT (ventricular tachycardia)     appropriate therapy via ICD 2013    Past Surgical History  Procedure Laterality Date  . Biv icd genertaor change out N/A 04/16/2013    Procedure: BIV ICD GENERTAOR CHANGE OUT;  Surgeon: Deboraha Sprang, MD;  Location: [facility] CATH LAB;  Service: Cardiovascular;  Laterality: N/A;    Current Outpatient Prescriptions  Medication Sig Dispense Refill  . allopurinol (ZYLOPRIM) 100 MG tablet Take 1 tablet (100 mg total) by mouth daily as needed (gout). 90 tablet 3  . amiodarone (PACERONE) 200 MG tablet Take 200 mg by mouth daily.    . furosemide (LASIX) 40 MG tablet Take 40 mg by mouth daily.      Marland Kitchen levothyroxine (SYNTHROID, LEVOTHROID) 50 MCG tablet Take 50 mcg by mouth daily before breakfast.    . lisinopril (PRINIVIL,ZESTRIL) 20 MG tablet Take 20 mg by mouth daily.     . meclizine (ANTIVERT) 25 MG tablet Take 1 tablet (25 mg total) by mouth 3 (three) times daily as needed for  dizziness. 30 tablet 0  . metoprolol (LOPRESSOR) 50 MG tablet Take 50 mg by mouth daily.     . Multiple Vitamins-Calcium (ONE-A-DAY WOMENS FORMULA) TABS Take 1 tablet by mouth daily.      . potassium chloride SA (K-DUR,KLOR-CON) 20 MEQ tablet Take 20 mEq by mouth daily.     . vitamin D, CHOLECALCIFEROL, 400 UNITS tablet Take 800 Units by mouth daily.    Marland Kitchen warfarin (COUMADIN) 2.5 MG tablet Take 2.5 mg by mouth daily. T-TH-Sat-Sun    . warfarin (COUMADIN) 3 MG tablet Take 3 mg by mouth daily. M-W-F     No current facility-administered medications for this visit.    Allergies  Allergen Reactions  . Codeine     Unknown    Review of Systems negative except from HPI and PMH  Physical Exam BP 120/86 mmHg  Pulse 68  Ht 5\' 2"  (1.575 m)  Wt 168 lb 3.2 oz (76.295 kg)  BMI 30.76 kg/m2 Well developed and well nourished in no acute distress HENT normal E scleral and icterus clear Neck Supple JVP flat; carotids brisk and full Clear to ausculation Device pocket well healed; without hematoma or erythema.  There is no tethering   Regular rate and rhythm, 2 / 6 murmurs gallops or rub Soft with active bowel sounds No clubbing cyanosis noEdema Alert and oriented, grossly normal motor and sensory function Skin Warm and Dry  ECG demonstrates P. Synchronous  Pacing @ 68  QRS is negative in lead V1     Assessment and  Plan   nonischemic cardiomyopathy   HFrEF  Venricular tachycardia  No intercurrent Ventricular tachycardia  Defibrillator-CRT The patient's device was interrogated.  The information was reviewed. No changes were made in the programming.       Euvolemic continue current meds

## 2015-05-28 DIAGNOSIS — Z95810 Presence of automatic (implantable) cardiac defibrillator: Secondary | ICD-10-CM | POA: Diagnosis not present

## 2015-05-28 DIAGNOSIS — Q210 Ventricular septal defect: Secondary | ICD-10-CM | POA: Diagnosis not present

## 2015-05-28 DIAGNOSIS — I429 Cardiomyopathy, unspecified: Secondary | ICD-10-CM | POA: Diagnosis not present

## 2015-05-28 DIAGNOSIS — I472 Ventricular tachycardia: Secondary | ICD-10-CM | POA: Diagnosis not present

## 2015-05-28 DIAGNOSIS — Z7901 Long term (current) use of anticoagulants: Secondary | ICD-10-CM | POA: Diagnosis not present

## 2015-05-28 DIAGNOSIS — N183 Chronic kidney disease, stage 3 (moderate): Secondary | ICD-10-CM | POA: Diagnosis not present

## 2015-05-28 DIAGNOSIS — E668 Other obesity: Secondary | ICD-10-CM | POA: Diagnosis not present

## 2015-05-28 DIAGNOSIS — I5022 Chronic systolic (congestive) heart failure: Secondary | ICD-10-CM | POA: Diagnosis not present

## 2015-05-28 DIAGNOSIS — E039 Hypothyroidism, unspecified: Secondary | ICD-10-CM | POA: Diagnosis not present

## 2015-06-03 IMAGING — CR DG CHEST 2V
2 series · 2 of 2 positions shown · non-contrast
Comparison: 05/18/2012

CLINICAL DATA: Wheezing

EXAM:
CHEST  2 VIEW

[view not recorded (1 of 2)]
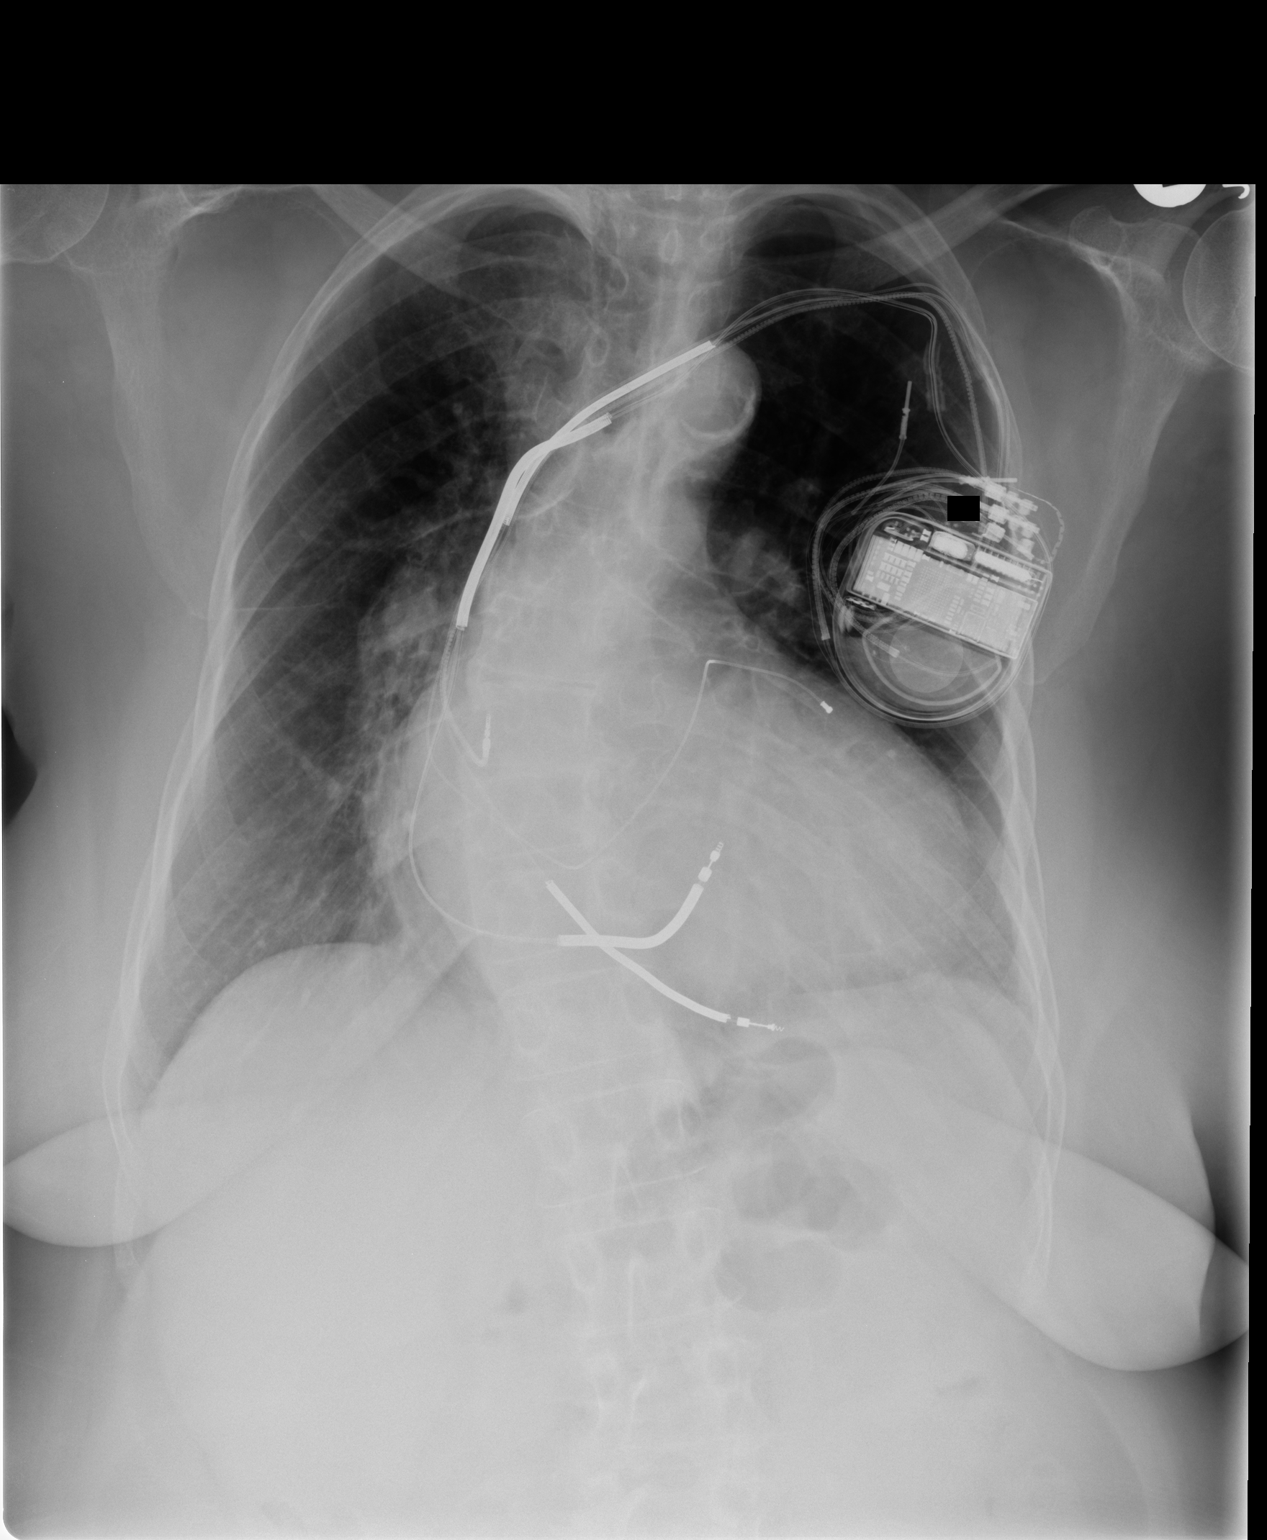

[view not recorded (2 of 2)]
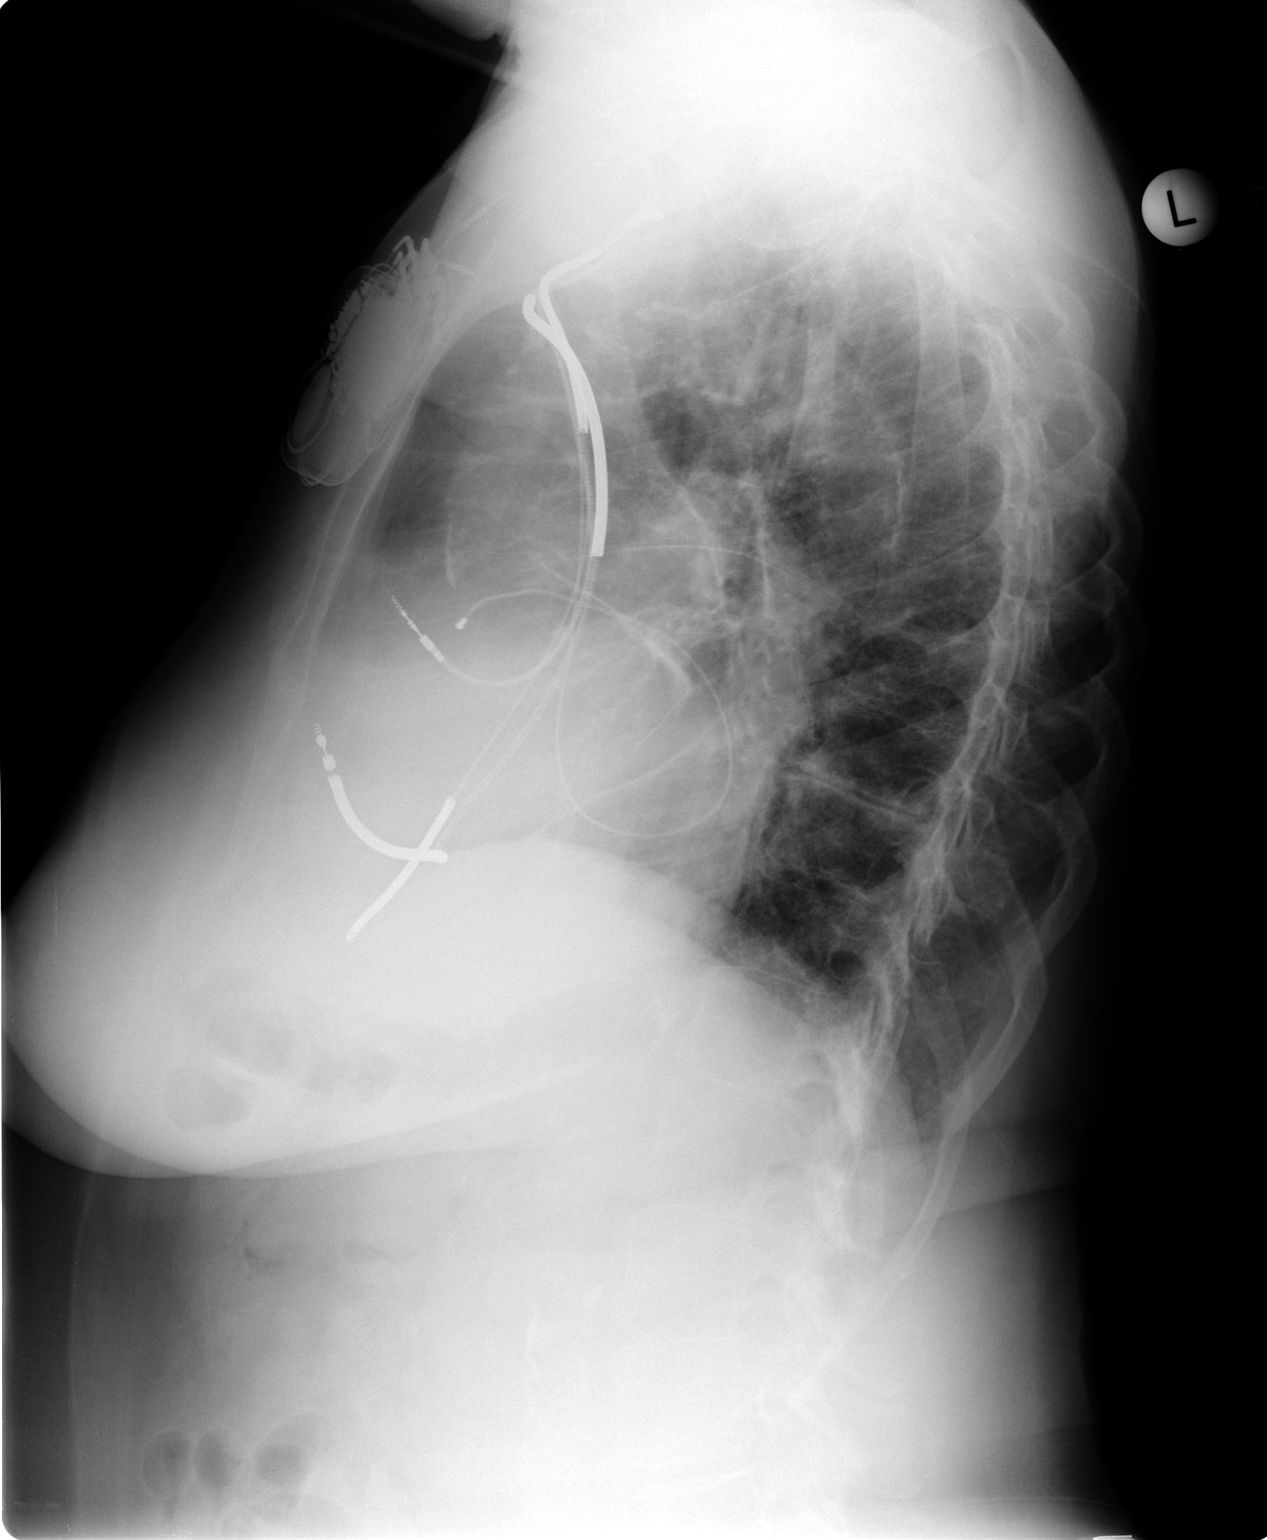

[2 of 2 positions shown; findings below may reference images not displayed]

FINDINGS: Cardiac shadow remains enlarged. It defibrillator is again noted on
the left. The lungs are clear. A scoliosis of the thoracic spine is
again seen.
IMPRESSION: No acute abnormality noted.

## 2015-06-08 ENCOUNTER — Other Ambulatory Visit: Payer: Medicare Other | Admitting: Internal Medicine

## 2015-06-08 DIAGNOSIS — I10 Essential (primary) hypertension: Secondary | ICD-10-CM | POA: Diagnosis not present

## 2015-06-08 DIAGNOSIS — E559 Vitamin D deficiency, unspecified: Secondary | ICD-10-CM | POA: Diagnosis not present

## 2015-06-08 DIAGNOSIS — E785 Hyperlipidemia, unspecified: Secondary | ICD-10-CM | POA: Diagnosis not present

## 2015-06-08 DIAGNOSIS — E108 Type 1 diabetes mellitus with unspecified complications: Secondary | ICD-10-CM

## 2015-06-08 DIAGNOSIS — E039 Hypothyroidism, unspecified: Secondary | ICD-10-CM | POA: Diagnosis not present

## 2015-06-08 DIAGNOSIS — Z79899 Other long term (current) drug therapy: Secondary | ICD-10-CM

## 2015-06-08 DIAGNOSIS — Z0000 Encounter for general adult medical examination without abnormal findings: Secondary | ICD-10-CM

## 2015-06-08 LAB — COMPLETE METABOLIC PANEL WITH GFR
ALT: 20 U/L (ref 6–29)
AST: 21 U/L (ref 10–35)
Albumin: 4 g/dL (ref 3.6–5.1)
Alkaline Phosphatase: 118 U/L (ref 33–130)
BUN: 41 mg/dL — AB (ref 7–25)
CALCIUM: 9.7 mg/dL (ref 8.6–10.4)
CHLORIDE: 104 mmol/L (ref 98–110)
CO2: 24 mmol/L (ref 20–31)
Creat: 2.14 mg/dL — ABNORMAL HIGH (ref 0.60–0.93)
GFR, Est African American: 25 mL/min — ABNORMAL LOW (ref >=60)
GFR, Est Non African American: 22 mL/min — ABNORMAL LOW (ref >=60)
Glucose, Bld: 110 mg/dL — ABNORMAL HIGH (ref 65–99)
POTASSIUM: 4.5 mmol/L (ref 3.5–5.3)
Sodium: 141 mmol/L (ref 135–146)
Total Bilirubin: 1 mg/dL (ref 0.2–1.2)
Total Protein: 6.6 g/dL (ref 6.1–8.1)

## 2015-06-08 LAB — LIPID PANEL
Cholesterol: 277 mg/dL — ABNORMAL HIGH (ref 125–200)
HDL: 64 mg/dL (ref >=46)
LDL CALC: 190 mg/dL — AB (ref <130)
TRIGLYCERIDES: 113 mg/dL (ref <150)
Total CHOL/HDL Ratio: 4.3 Ratio (ref <=5.0)
VLDL: 23 mg/dL (ref <30)

## 2015-06-08 LAB — CBC WITH DIFFERENTIAL/PLATELET
Basophils Absolute: 0 10*3/uL (ref 0.0–0.1)
Basophils Relative: 1 % (ref 0–1)
Eosinophils Absolute: 0.1 10*3/uL (ref 0.0–0.7)
Eosinophils Relative: 2 % (ref 0–5)
HEMATOCRIT: 44.5 % (ref 36.0–46.0)
Hemoglobin: 14.7 g/dL (ref 12.0–15.0)
LYMPHS ABS: 1.4 10*3/uL (ref 0.7–4.0)
LYMPHS PCT: 30 % (ref 12–46)
MCH: 34.1 pg — ABNORMAL HIGH (ref 26.0–34.0)
MCHC: 33 g/dL (ref 30.0–36.0)
MCV: 103.2 fL — ABNORMAL HIGH (ref 78.0–100.0)
MPV: 12.1 fL (ref 8.6–12.4)
Monocytes Absolute: 0.5 10*3/uL (ref 0.1–1.0)
Monocytes Relative: 11 % (ref 3–12)
NEUTROS ABS: 2.6 10*3/uL (ref 1.7–7.7)
NEUTROS PCT: 56 % (ref 43–77)
PLATELETS: 133 10*3/uL — AB (ref 150–400)
RBC: 4.31 MIL/uL (ref 3.87–5.11)
RDW: 13.5 % (ref 11.5–15.5)
WBC: 4.6 10*3/uL (ref 4.0–10.5)

## 2015-06-09 LAB — TSH: TSH: 3.537 u[IU]/mL (ref 0.350–4.500)

## 2015-06-09 LAB — HEMOGLOBIN A1C
HEMOGLOBIN A1C: 5.8 % — AB (ref <5.7)
MEAN PLASMA GLUCOSE: 120 mg/dL — AB (ref <117)

## 2015-06-09 LAB — VITAMIN D 25 HYDROXY (VIT D DEFICIENCY, FRACTURES): Vit D, 25-Hydroxy: 23 ng/mL — ABNORMAL LOW (ref 30–100)

## 2015-06-10 ENCOUNTER — Ambulatory Visit (INDEPENDENT_AMBULATORY_CARE_PROVIDER_SITE_OTHER): Payer: Medicare Other | Admitting: Internal Medicine

## 2015-06-10 ENCOUNTER — Encounter: Payer: Self-pay | Admitting: Internal Medicine

## 2015-06-10 VITALS — BP 136/86 | HR 77 | Temp 97.6°F | Resp 20 | Ht 62.0 in | Wt 169.0 lb

## 2015-06-10 DIAGNOSIS — Z0000 Encounter for general adult medical examination without abnormal findings: Secondary | ICD-10-CM

## 2015-06-10 DIAGNOSIS — F411 Generalized anxiety disorder: Secondary | ICD-10-CM | POA: Diagnosis not present

## 2015-06-10 DIAGNOSIS — Z95810 Presence of automatic (implantable) cardiac defibrillator: Secondary | ICD-10-CM

## 2015-06-10 DIAGNOSIS — Z8739 Personal history of other diseases of the musculoskeletal system and connective tissue: Secondary | ICD-10-CM

## 2015-06-10 DIAGNOSIS — Z7901 Long term (current) use of anticoagulants: Secondary | ICD-10-CM

## 2015-06-10 DIAGNOSIS — E119 Type 2 diabetes mellitus without complications: Secondary | ICD-10-CM | POA: Diagnosis not present

## 2015-06-10 DIAGNOSIS — E039 Hypothyroidism, unspecified: Secondary | ICD-10-CM

## 2015-06-10 DIAGNOSIS — Z789 Other specified health status: Secondary | ICD-10-CM

## 2015-06-10 DIAGNOSIS — Z8639 Personal history of other endocrine, nutritional and metabolic disease: Secondary | ICD-10-CM | POA: Diagnosis not present

## 2015-06-10 DIAGNOSIS — Q210 Ventricular septal defect: Secondary | ICD-10-CM | POA: Diagnosis not present

## 2015-06-10 DIAGNOSIS — R748 Abnormal levels of other serum enzymes: Secondary | ICD-10-CM | POA: Diagnosis not present

## 2015-06-10 DIAGNOSIS — I429 Cardiomyopathy, unspecified: Secondary | ICD-10-CM

## 2015-06-10 DIAGNOSIS — R7989 Other specified abnormal findings of blood chemistry: Secondary | ICD-10-CM

## 2015-06-10 DIAGNOSIS — Z889 Allergy status to unspecified drugs, medicaments and biological substances status: Secondary | ICD-10-CM

## 2015-06-10 DIAGNOSIS — E785 Hyperlipidemia, unspecified: Secondary | ICD-10-CM

## 2015-06-10 DIAGNOSIS — E8881 Metabolic syndrome: Secondary | ICD-10-CM | POA: Diagnosis not present

## 2015-06-10 DIAGNOSIS — I10 Essential (primary) hypertension: Secondary | ICD-10-CM

## 2015-06-10 DIAGNOSIS — I428 Other cardiomyopathies: Secondary | ICD-10-CM

## 2015-06-10 LAB — POCT URINALYSIS DIPSTICK
BILIRUBIN UA: NEGATIVE
Blood, UA: NEGATIVE
Glucose, UA: NEGATIVE
Ketones, UA: NEGATIVE
LEUKOCYTES UA: NEGATIVE
NITRITE UA: NEGATIVE
PH UA: 7
PROTEIN UA: NEGATIVE
Spec Grav, UA: 1.02
UROBILINOGEN UA: 0.2

## 2015-06-11 ENCOUNTER — Telehealth: Payer: Self-pay

## 2015-06-11 NOTE — Telephone Encounter (Signed)
Patient notified of appt for ultrasound on Jan 4th @ 3:10 @ [place]

## 2015-06-11 NOTE — Patient Instructions (Signed)
It was a pleasure to see you today. Continue to watch diet. Continue same medications and return in 6 months.

## 2015-06-11 NOTE — Progress Notes (Signed)
Subjective:    Patient ID: Casey [name], female    DOB: 04-30-1935, 80 y.o.   MRN: [id number]  HPI 80 year old Black  Female in today for health maintenance exam and evaluation of medical issues. She has a history of nonischemic cardiomyopathy with low ejection fraction, implantable defibrillator, hyperlipidemia, hypertension, uncontrolled type 2 diabetes mellitus, chronic Coumadin therapy, glaucoma. Patient is under the care of  Dr. Wynonia Lawman and Dr. Caryl Comes, Cardiologists. Past medical history: Hysterectomy with BSO February 1990. She took estrogen replacement for number of years. She has had to use a searing sections. History of peptic ulcer disease when she was 80 years old. History of spinal stenosis treated conservatively by Dr. Carloyn Manner many years ago but was hospitalized due to severe pain at that time.  Right cataract extraction July 2009. History of ganglion cyst 2 cm in diameter aspirated from right leg by orthopedist.  Insertion of implantable defibrillator 2005. Had new defibrillator placed July 2009.  In December 2010 I incised and drained an infected epidermoid cyst.  She took Lexapro for a while during the time her mother was ill but has been able to stop that medication.  Last colonoscopy on file 1999. She receives annual influenza immunization. Had tetanus vaccine March 2011. Pneumovax immunization March 2011.  Social history: She is married. She is a Agricultural engineer. Completed one year of college. Husband is a retired Art gallery manager who has COPD. They have a son and a daughter. Patient does not smoke or consume alcohol.  She has regular eye exams because of history of glaucoma.  Family history: Father died at age 69 with history of cancer of the esophagus. Mother with history of diabetes mellitus, CVA, hypertension, MI, dementia. One brother died of unknown causes. Patient indicates it was a suspicious death but he had a history of heart problems. One sister living but not much contact  with her.    Review of Systems  Constitutional: Negative.   All other systems reviewed and are negative.      Objective:   Physical Exam  Constitutional: She is oriented to person, place, and time. She appears well-developed and well-nourished. No distress.  HENT:  Head: Normocephalic and atraumatic.  Right Ear: External ear normal.  Left Ear: External ear normal.  Mouth/Throat: Oropharynx is clear and moist. No oropharyngeal exudate.  Eyes: Conjunctivae and EOM are normal. Pupils are equal, round, and reactive to light. Right eye exhibits no discharge. Left eye exhibits no discharge. No scleral icterus.  Neck: Neck supple. No JVD present. No thyromegaly present.  Cardiovascular: Normal rate, regular rhythm, normal heart sounds and intact distal pulses.   No murmur heard. 3/6 holosystolic ejection murmur. Unchanged.  Pulmonary/Chest: Effort normal and breath sounds normal. She has no rales.  Abdominal: Soft. Bowel sounds are normal. She exhibits no mass. There is no tenderness. There is no rebound and no guarding.  Genitourinary:  Deferred due to age and history of hysterectomy with BSO  Musculoskeletal: She exhibits no edema.  Lymphadenopathy:    She has no cervical adenopathy.  Neurological: She is alert and oriented to person, place, and time. She has normal reflexes. She displays normal reflexes. No cranial nerve deficit.  Skin: Skin is warm and dry. No rash noted. She is not diaphoretic.  Psychiatric: She has a normal mood and affect. Her behavior is normal. Judgment and thought content normal.  Vitals reviewed.         Assessment & Plan:  Controlled type 2 diabetes mellitus-stable  Glaucoma-followed by  ophthalmologist with regular appointments  Hyperlipidemia-statin intolerant. Watch diet.  History of vitamin D deficiency  Essential hypertension-stable  Anxiety  History of lumbar spinal stenosis currently asymptomatic  History of ventricular septal  defect  History of nonischemic cardiomyopathy  History of implantable cardiac defibrillator  Chronic anticoagulation therapy  Metabolic syndrome  Plan: Continue same medications and return in 6 months.  Subjective:   Patient presents for Medicare Annual/Subsequent preventive examination.  Review Past Medical/Family/Social:   Risk Factors  Current exercise habits: Sedentary Dietary issues discussed: Low fat low carbohydrate  Cardiac risk factors: Hyperlipidemia, diabetes  Depression Screen  (Note: if answer to either of the following is "Yes", a more complete depression screening is indicated)   Over the past two weeks, have you felt down, depressed or hopeless? No  Over the past two weeks, have you felt little interest or pleasure in doing things? Yes-frustrated with husband Have you lost interest or pleasure in daily life? No Do you often feel hopeless? No Do you cry easily over simple problems? No   Activities of Daily Living  In your present state of health, do you have any difficulty performing the following activities?:   Driving? No  Managing money? No  Feeding yourself? No  Getting from bed to chair? No  Climbing a flight of stairs? No  Preparing food and eating?: No  Bathing or showering? No  Getting dressed: No  Getting to the toilet? No  Using the toilet:No  Moving around from place to place: No  In the past year have you fallen or had a near fall?: Yes Are you sexually active? No  Do you have more than one partner? No   Hearing Difficulties: No  Do you often ask people to speak up or repeat themselves? No  Do you experience ringing or noises in your ears? Sometimes Do you have difficulty understanding soft or whispered voices? Sometimes Do you feel that you have a problem with memory? No Do you often misplace items? No    Home Safety:  Do you have a smoke alarm at your residence? Yes Do you have grab bars in the bathroom? No Do you have throw  rugs in your house? No   Cognitive Testing  Alert? Yes Normal Appearance?Yes  Oriented to person? Yes Place? Yes  Time? Yes  Recall of three objects? Yes  Can perform simple calculations? Yes  Displays appropriate judgment?Yes  Can read the correct time from a watch face?Yes   List the Names of Other Physician/Practitioners you currently use:  See referral list for the physicians patient is currently seeing.  Ophthalmologist, Dr. Wynonia Lawman and Dr. Caryl Comes   Review of Systems: See above   Objective:     General appearance: Appears stated age  Head: Normocephalic, without obvious abnormality, atraumatic  Eyes: conj clear, EOMi PEERLA  Ears: normal TM's and external ear canals both ears  Nose: Nares normal. Septum midline. Mucosa normal. No drainage or sinus tenderness.  Throat: lips, mucosa, and tongue normal; teeth and gums normal  Neck: no adenopathy, no carotid bruit, no JVD, supple, symmetrical, trachea midline and thyroid not enlarged, symmetric, no tenderness/mass/nodules  No CVA tenderness.  Lungs: clear to auscultation bilaterally  Breasts: normal appearance, no masses or tenderness Heart: regular rate and rhythm, S1, S2 normal, no murmur, click, rub or gallop  Abdomen: soft, non-tender; bowel sounds normal; no masses, no organomegaly  Musculoskeletal: ROM normal in all joints, no crepitus, no deformity, Normal muscle strengthen. Back  is symmetric,  no curvature. Skin: Skin color, texture, turgor normal. No rashes or lesions  Lymph nodes: Cervical, supraclavicular, and axillary nodes normal.  Neurologic: CN 2 -12 Normal, Normal symmetric reflexes. Normal coordination and gait  Psych: Alert & Oriented x 3, Mood appear stable.    Assessment:    Annual wellness medicare exam   Plan:    During the course of the visit the patient was educated and counseled about appropriate screening and preventive services including:  Annual flu vaccine  Annual  mammogram      Patient Instructions (the written plan) was given to the patient.  Medicare Attestation  I have personally reviewed:  The patient's medical and social history  Their use of alcohol, tobacco or illicit drugs  Their current medications and supplements  The patient's functional ability including ADLs,fall risks, home safety risks, cognitive, and hearing and visual impairment  Diet and physical activities  Evidence for depression or mood disorders  The patient's weight, height, BMI, and visual acuity have been recorded in the chart. I have made referrals, counseling, and provided education to the patient based on review of the above and I have provided the patient with a written personalized care plan for preventive services.

## 2015-06-16 ENCOUNTER — Ambulatory Visit
Admission: RE | Admit: 2015-06-16 | Discharge: 2015-06-16 | Disposition: A | Discharge status: Home / Self Care | Payer: Medicare Other | Source: Ambulatory Visit | Attending: Internal Medicine | Admitting: Internal Medicine

## 2015-06-16 DIAGNOSIS — N281 Cyst of kidney, acquired: Secondary | ICD-10-CM | POA: Diagnosis not present

## 2015-06-16 DIAGNOSIS — R7989 Other specified abnormal findings of blood chemistry: Secondary | ICD-10-CM

## 2015-06-24 DIAGNOSIS — E039 Hypothyroidism, unspecified: Secondary | ICD-10-CM | POA: Diagnosis not present

## 2015-06-24 DIAGNOSIS — N183 Chronic kidney disease, stage 3 (moderate): Secondary | ICD-10-CM | POA: Diagnosis not present

## 2015-06-24 DIAGNOSIS — Z7901 Long term (current) use of anticoagulants: Secondary | ICD-10-CM | POA: Diagnosis not present

## 2015-06-24 DIAGNOSIS — I472 Ventricular tachycardia: Secondary | ICD-10-CM | POA: Diagnosis not present

## 2015-06-24 DIAGNOSIS — Q210 Ventricular septal defect: Secondary | ICD-10-CM | POA: Diagnosis not present

## 2015-06-24 DIAGNOSIS — I429 Cardiomyopathy, unspecified: Secondary | ICD-10-CM | POA: Diagnosis not present

## 2015-06-24 DIAGNOSIS — E668 Other obesity: Secondary | ICD-10-CM | POA: Diagnosis not present

## 2015-06-24 DIAGNOSIS — Z95810 Presence of automatic (implantable) cardiac defibrillator: Secondary | ICD-10-CM | POA: Diagnosis not present

## 2015-06-24 DIAGNOSIS — I5022 Chronic systolic (congestive) heart failure: Secondary | ICD-10-CM | POA: Diagnosis not present

## 2015-07-01 DIAGNOSIS — I472 Ventricular tachycardia: Secondary | ICD-10-CM | POA: Diagnosis not present

## 2015-07-01 DIAGNOSIS — N183 Chronic kidney disease, stage 3 (moderate): Secondary | ICD-10-CM | POA: Diagnosis not present

## 2015-07-01 DIAGNOSIS — Z7901 Long term (current) use of anticoagulants: Secondary | ICD-10-CM | POA: Diagnosis not present

## 2015-07-01 DIAGNOSIS — E668 Other obesity: Secondary | ICD-10-CM | POA: Diagnosis not present

## 2015-07-01 DIAGNOSIS — Z95810 Presence of automatic (implantable) cardiac defibrillator: Secondary | ICD-10-CM | POA: Diagnosis not present

## 2015-07-01 DIAGNOSIS — I429 Cardiomyopathy, unspecified: Secondary | ICD-10-CM | POA: Diagnosis not present

## 2015-07-01 DIAGNOSIS — I5022 Chronic systolic (congestive) heart failure: Secondary | ICD-10-CM | POA: Diagnosis not present

## 2015-07-01 DIAGNOSIS — Q210 Ventricular septal defect: Secondary | ICD-10-CM | POA: Diagnosis not present

## 2015-07-01 DIAGNOSIS — E039 Hypothyroidism, unspecified: Secondary | ICD-10-CM | POA: Diagnosis not present

## 2015-07-22 DIAGNOSIS — I5022 Chronic systolic (congestive) heart failure: Secondary | ICD-10-CM | POA: Diagnosis not present

## 2015-07-22 DIAGNOSIS — N183 Chronic kidney disease, stage 3 (moderate): Secondary | ICD-10-CM | POA: Diagnosis not present

## 2015-07-22 DIAGNOSIS — Q210 Ventricular septal defect: Secondary | ICD-10-CM | POA: Diagnosis not present

## 2015-07-22 DIAGNOSIS — E668 Other obesity: Secondary | ICD-10-CM | POA: Diagnosis not present

## 2015-07-22 DIAGNOSIS — E039 Hypothyroidism, unspecified: Secondary | ICD-10-CM | POA: Diagnosis not present

## 2015-07-22 DIAGNOSIS — I429 Cardiomyopathy, unspecified: Secondary | ICD-10-CM | POA: Diagnosis not present

## 2015-07-22 DIAGNOSIS — I472 Ventricular tachycardia: Secondary | ICD-10-CM | POA: Diagnosis not present

## 2015-07-22 DIAGNOSIS — Z95810 Presence of automatic (implantable) cardiac defibrillator: Secondary | ICD-10-CM | POA: Diagnosis not present

## 2015-07-22 DIAGNOSIS — Z7901 Long term (current) use of anticoagulants: Secondary | ICD-10-CM | POA: Diagnosis not present

## 2015-08-16 DIAGNOSIS — H401112 Primary open-angle glaucoma, right eye, moderate stage: Secondary | ICD-10-CM | POA: Diagnosis not present

## 2015-08-16 DIAGNOSIS — H524 Presbyopia: Secondary | ICD-10-CM | POA: Diagnosis not present

## 2015-08-16 DIAGNOSIS — H02109 Unspecified ectropion of unspecified eye, unspecified eyelid: Secondary | ICD-10-CM | POA: Diagnosis not present

## 2015-08-16 DIAGNOSIS — H401121 Primary open-angle glaucoma, left eye, mild stage: Secondary | ICD-10-CM | POA: Diagnosis not present

## 2015-08-26 DIAGNOSIS — N183 Chronic kidney disease, stage 3 (moderate): Secondary | ICD-10-CM | POA: Diagnosis not present

## 2015-08-26 DIAGNOSIS — E039 Hypothyroidism, unspecified: Secondary | ICD-10-CM | POA: Diagnosis not present

## 2015-08-26 DIAGNOSIS — Z95810 Presence of automatic (implantable) cardiac defibrillator: Secondary | ICD-10-CM | POA: Diagnosis not present

## 2015-08-26 DIAGNOSIS — Q210 Ventricular septal defect: Secondary | ICD-10-CM | POA: Diagnosis not present

## 2015-08-26 DIAGNOSIS — I5022 Chronic systolic (congestive) heart failure: Secondary | ICD-10-CM | POA: Diagnosis not present

## 2015-08-26 DIAGNOSIS — I429 Cardiomyopathy, unspecified: Secondary | ICD-10-CM | POA: Diagnosis not present

## 2015-08-26 DIAGNOSIS — Z7901 Long term (current) use of anticoagulants: Secondary | ICD-10-CM | POA: Diagnosis not present

## 2015-08-26 DIAGNOSIS — E668 Other obesity: Secondary | ICD-10-CM | POA: Diagnosis not present

## 2015-08-26 DIAGNOSIS — I472 Ventricular tachycardia: Secondary | ICD-10-CM | POA: Diagnosis not present

## 2015-09-02 DIAGNOSIS — R7989 Other specified abnormal findings of blood chemistry: Secondary | ICD-10-CM | POA: Diagnosis not present

## 2015-09-02 DIAGNOSIS — D631 Anemia in chronic kidney disease: Secondary | ICD-10-CM | POA: Diagnosis not present

## 2015-09-02 DIAGNOSIS — N183 Chronic kidney disease, stage 3 (moderate): Secondary | ICD-10-CM | POA: Diagnosis not present

## 2015-09-02 DIAGNOSIS — N2581 Secondary hyperparathyroidism of renal origin: Secondary | ICD-10-CM | POA: Diagnosis not present

## 2015-09-02 DIAGNOSIS — R809 Proteinuria, unspecified: Secondary | ICD-10-CM | POA: Diagnosis not present

## 2015-09-09 ENCOUNTER — Other Ambulatory Visit: Payer: Self-pay

## 2015-09-09 DIAGNOSIS — Z1231 Encounter for screening mammogram for malignant neoplasm of breast: Secondary | ICD-10-CM

## 2015-09-23 DIAGNOSIS — Q210 Ventricular septal defect: Secondary | ICD-10-CM | POA: Diagnosis not present

## 2015-09-23 DIAGNOSIS — Z7901 Long term (current) use of anticoagulants: Secondary | ICD-10-CM | POA: Diagnosis not present

## 2015-09-23 DIAGNOSIS — E039 Hypothyroidism, unspecified: Secondary | ICD-10-CM | POA: Diagnosis not present

## 2015-09-23 DIAGNOSIS — Z95810 Presence of automatic (implantable) cardiac defibrillator: Secondary | ICD-10-CM | POA: Diagnosis not present

## 2015-09-23 DIAGNOSIS — I5022 Chronic systolic (congestive) heart failure: Secondary | ICD-10-CM | POA: Diagnosis not present

## 2015-09-23 DIAGNOSIS — E668 Other obesity: Secondary | ICD-10-CM | POA: Diagnosis not present

## 2015-09-23 DIAGNOSIS — I472 Ventricular tachycardia: Secondary | ICD-10-CM | POA: Diagnosis not present

## 2015-09-23 DIAGNOSIS — N183 Chronic kidney disease, stage 3 (moderate): Secondary | ICD-10-CM | POA: Diagnosis not present

## 2015-09-23 DIAGNOSIS — I429 Cardiomyopathy, unspecified: Secondary | ICD-10-CM | POA: Diagnosis not present

## 2015-09-30 DIAGNOSIS — E668 Other obesity: Secondary | ICD-10-CM | POA: Diagnosis not present

## 2015-09-30 DIAGNOSIS — I428 Other cardiomyopathies: Secondary | ICD-10-CM | POA: Diagnosis not present

## 2015-09-30 DIAGNOSIS — Q210 Ventricular septal defect: Secondary | ICD-10-CM | POA: Diagnosis not present

## 2015-09-30 DIAGNOSIS — I429 Cardiomyopathy, unspecified: Secondary | ICD-10-CM | POA: Diagnosis not present

## 2015-09-30 DIAGNOSIS — Z95810 Presence of automatic (implantable) cardiac defibrillator: Secondary | ICD-10-CM | POA: Diagnosis not present

## 2015-09-30 DIAGNOSIS — Z7901 Long term (current) use of anticoagulants: Secondary | ICD-10-CM | POA: Diagnosis not present

## 2015-09-30 DIAGNOSIS — I472 Ventricular tachycardia: Secondary | ICD-10-CM | POA: Diagnosis not present

## 2015-09-30 DIAGNOSIS — N183 Chronic kidney disease, stage 3 (moderate): Secondary | ICD-10-CM | POA: Diagnosis not present

## 2015-09-30 DIAGNOSIS — E039 Hypothyroidism, unspecified: Secondary | ICD-10-CM | POA: Diagnosis not present

## 2015-09-30 DIAGNOSIS — I5022 Chronic systolic (congestive) heart failure: Secondary | ICD-10-CM | POA: Diagnosis not present

## 2015-10-01 ENCOUNTER — Ambulatory Visit
Admission: RE | Admit: 2015-10-01 | Discharge: 2015-10-01 | Disposition: A | Discharge status: Home / Self Care | Payer: Medicare Other | Source: Ambulatory Visit

## 2015-10-01 DIAGNOSIS — Z1231 Encounter for screening mammogram for malignant neoplasm of breast: Secondary | ICD-10-CM

## 2015-10-11 ENCOUNTER — Encounter: Payer: Self-pay | Admitting: Internal Medicine

## 2015-10-11 DIAGNOSIS — I5022 Chronic systolic (congestive) heart failure: Secondary | ICD-10-CM | POA: Diagnosis not present

## 2015-10-11 DIAGNOSIS — N183 Chronic kidney disease, stage 3 (moderate): Secondary | ICD-10-CM | POA: Diagnosis not present

## 2015-10-11 DIAGNOSIS — Z95810 Presence of automatic (implantable) cardiac defibrillator: Secondary | ICD-10-CM | POA: Diagnosis not present

## 2015-10-11 DIAGNOSIS — Q210 Ventricular septal defect: Secondary | ICD-10-CM | POA: Diagnosis not present

## 2015-10-11 DIAGNOSIS — I472 Ventricular tachycardia: Secondary | ICD-10-CM | POA: Diagnosis not present

## 2015-10-11 DIAGNOSIS — E668 Other obesity: Secondary | ICD-10-CM | POA: Diagnosis not present

## 2015-10-11 DIAGNOSIS — I428 Other cardiomyopathies: Secondary | ICD-10-CM | POA: Diagnosis not present

## 2015-10-11 DIAGNOSIS — I5032 Chronic diastolic (congestive) heart failure: Secondary | ICD-10-CM | POA: Diagnosis not present

## 2015-10-11 DIAGNOSIS — E039 Hypothyroidism, unspecified: Secondary | ICD-10-CM | POA: Diagnosis not present

## 2015-10-11 DIAGNOSIS — Z7901 Long term (current) use of anticoagulants: Secondary | ICD-10-CM | POA: Diagnosis not present

## 2015-10-11 DIAGNOSIS — I429 Cardiomyopathy, unspecified: Secondary | ICD-10-CM | POA: Diagnosis not present

## 2015-10-14 DIAGNOSIS — R7989 Other specified abnormal findings of blood chemistry: Secondary | ICD-10-CM | POA: Diagnosis not present

## 2015-10-14 DIAGNOSIS — R809 Proteinuria, unspecified: Secondary | ICD-10-CM | POA: Diagnosis not present

## 2015-10-14 DIAGNOSIS — N183 Chronic kidney disease, stage 3 (moderate): Secondary | ICD-10-CM | POA: Diagnosis not present

## 2015-10-14 DIAGNOSIS — D631 Anemia in chronic kidney disease: Secondary | ICD-10-CM | POA: Diagnosis not present

## 2015-10-14 DIAGNOSIS — N2581 Secondary hyperparathyroidism of renal origin: Secondary | ICD-10-CM | POA: Diagnosis not present

## 2015-10-20 DIAGNOSIS — Z95810 Presence of automatic (implantable) cardiac defibrillator: Secondary | ICD-10-CM | POA: Diagnosis not present

## 2015-10-20 DIAGNOSIS — Q210 Ventricular septal defect: Secondary | ICD-10-CM | POA: Diagnosis not present

## 2015-10-20 DIAGNOSIS — I428 Other cardiomyopathies: Secondary | ICD-10-CM | POA: Diagnosis not present

## 2015-10-20 DIAGNOSIS — I472 Ventricular tachycardia: Secondary | ICD-10-CM | POA: Diagnosis not present

## 2015-10-20 DIAGNOSIS — E039 Hypothyroidism, unspecified: Secondary | ICD-10-CM | POA: Diagnosis not present

## 2015-10-20 DIAGNOSIS — I429 Cardiomyopathy, unspecified: Secondary | ICD-10-CM | POA: Diagnosis not present

## 2015-10-20 DIAGNOSIS — Z7901 Long term (current) use of anticoagulants: Secondary | ICD-10-CM | POA: Diagnosis not present

## 2015-10-20 DIAGNOSIS — E668 Other obesity: Secondary | ICD-10-CM | POA: Diagnosis not present

## 2015-10-20 DIAGNOSIS — N183 Chronic kidney disease, stage 3 (moderate): Secondary | ICD-10-CM | POA: Diagnosis not present

## 2015-10-20 DIAGNOSIS — I5022 Chronic systolic (congestive) heart failure: Secondary | ICD-10-CM | POA: Diagnosis not present

## 2015-11-17 DIAGNOSIS — I428 Other cardiomyopathies: Secondary | ICD-10-CM | POA: Diagnosis not present

## 2015-11-17 DIAGNOSIS — I429 Cardiomyopathy, unspecified: Secondary | ICD-10-CM | POA: Diagnosis not present

## 2015-11-17 DIAGNOSIS — I472 Ventricular tachycardia: Secondary | ICD-10-CM | POA: Diagnosis not present

## 2015-11-17 DIAGNOSIS — I5022 Chronic systolic (congestive) heart failure: Secondary | ICD-10-CM | POA: Diagnosis not present

## 2015-11-17 DIAGNOSIS — Q210 Ventricular septal defect: Secondary | ICD-10-CM | POA: Diagnosis not present

## 2015-11-17 DIAGNOSIS — N183 Chronic kidney disease, stage 3 (moderate): Secondary | ICD-10-CM | POA: Diagnosis not present

## 2015-11-17 DIAGNOSIS — E039 Hypothyroidism, unspecified: Secondary | ICD-10-CM | POA: Diagnosis not present

## 2015-11-17 DIAGNOSIS — Z95810 Presence of automatic (implantable) cardiac defibrillator: Secondary | ICD-10-CM | POA: Diagnosis not present

## 2015-11-17 DIAGNOSIS — E668 Other obesity: Secondary | ICD-10-CM | POA: Diagnosis not present

## 2015-11-17 DIAGNOSIS — Z7901 Long term (current) use of anticoagulants: Secondary | ICD-10-CM | POA: Diagnosis not present

## 2015-12-07 ENCOUNTER — Other Ambulatory Visit: Payer: Medicare Other | Admitting: Internal Medicine

## 2015-12-07 ENCOUNTER — Other Ambulatory Visit: Payer: Self-pay | Admitting: Internal Medicine

## 2015-12-07 DIAGNOSIS — E785 Hyperlipidemia, unspecified: Secondary | ICD-10-CM | POA: Diagnosis not present

## 2015-12-07 DIAGNOSIS — E119 Type 2 diabetes mellitus without complications: Secondary | ICD-10-CM | POA: Diagnosis not present

## 2015-12-07 DIAGNOSIS — Z79899 Other long term (current) drug therapy: Secondary | ICD-10-CM | POA: Diagnosis not present

## 2015-12-07 DIAGNOSIS — E039 Hypothyroidism, unspecified: Secondary | ICD-10-CM | POA: Diagnosis not present

## 2015-12-07 LAB — LIPID PANEL
CHOLESTEROL: 310 mg/dL — AB (ref 125–200)
HDL: 55 mg/dL (ref >=46)
LDL Cholesterol: 227 mg/dL — ABNORMAL HIGH (ref <130)
Total CHOL/HDL Ratio: 5.6 Ratio — ABNORMAL HIGH (ref <=5.0)
Triglycerides: 140 mg/dL (ref <150)
VLDL: 28 mg/dL (ref <30)

## 2015-12-07 LAB — HEPATIC FUNCTION PANEL
ALK PHOS: 108 U/L (ref 33–130)
ALT: 17 U/L (ref 6–29)
AST: 21 U/L (ref 10–35)
Albumin: 4.5 g/dL (ref 3.6–5.1)
BILIRUBIN DIRECT: 0.2 mg/dL (ref <=0.2)
BILIRUBIN INDIRECT: 1 mg/dL (ref 0.2–1.2)
BILIRUBIN TOTAL: 1.2 mg/dL (ref 0.2–1.2)
Total Protein: 7 g/dL (ref 6.1–8.1)

## 2015-12-07 LAB — HEMOGLOBIN A1C
Hgb A1c MFr Bld: 5.8 % — ABNORMAL HIGH (ref <5.7)
Mean Plasma Glucose: 120 mg/dL

## 2015-12-09 ENCOUNTER — Ambulatory Visit (INDEPENDENT_AMBULATORY_CARE_PROVIDER_SITE_OTHER): Payer: Medicare Other | Admitting: Internal Medicine

## 2015-12-09 ENCOUNTER — Encounter: Payer: Self-pay | Admitting: Internal Medicine

## 2015-12-09 VITALS — BP 128/80 | HR 64 | Temp 97.3°F | Resp 14 | Ht 62.0 in | Wt 164.0 lb

## 2015-12-09 DIAGNOSIS — E785 Hyperlipidemia, unspecified: Secondary | ICD-10-CM

## 2015-12-09 DIAGNOSIS — I428 Other cardiomyopathies: Secondary | ICD-10-CM

## 2015-12-09 DIAGNOSIS — I10 Essential (primary) hypertension: Secondary | ICD-10-CM | POA: Diagnosis not present

## 2015-12-09 DIAGNOSIS — H9193 Unspecified hearing loss, bilateral: Secondary | ICD-10-CM

## 2015-12-09 DIAGNOSIS — I429 Cardiomyopathy, unspecified: Secondary | ICD-10-CM | POA: Diagnosis not present

## 2015-12-09 DIAGNOSIS — R7302 Impaired glucose tolerance (oral): Secondary | ICD-10-CM

## 2015-12-09 DIAGNOSIS — E039 Hypothyroidism, unspecified: Secondary | ICD-10-CM | POA: Diagnosis not present

## 2015-12-09 MED ORDER — EZETIMIBE 10 MG PO TABS: 10.0000 mg | ORAL_TABLET | Freq: Every day | ORAL | Status: DC

## 2015-12-09 NOTE — Progress Notes (Signed)
   Subjective:    Patient ID: Casey [name], female    DOB: 06-09-1935, 81 y.o.   MRN: [id number]  HPI 81 year old Black  Female with nonischemic cardiomyopathy and implantable defibrillator for 6 month recheck. Weight down 5 pounds from CPE visit December 2016.Blood pressure is excellent 128/80. She sees Dr. Wynonia Lawman regularly. No chest pain. No shortness of breath. She has begun to have issues with hearing. Has suggested she have her hearing checked and may likely need hearing aids. Her daughters coming next month to visit and she should ask her to help her with that. She is on chronic anticoagulation with Coumadin She has history of hyperlipidemia. Is not able to take statin medication due to warfarin therapy she says. Her total cholesterol has gone up to 310. LDL cholesterol is 227. Says she's been under stress with her husband who apparently has dementia as well as COPD. We will to try Zetia 10 mg daily if she can afford it and have her follow up at time of physical exam.  History of hypothyroidism. TSH checked and pending.  Seems chronically anxious.  Diabetic foot exam shows no ulcers or abnormal lesions. Pulses intact.    Review of Systems see above     Objective:   Physical Exam Skin warm and dry. Nodes none. Thyroid very slightly enlarged and symmetrical. No nodules palpable. Chest clear to auscultation. Cardiac exam regular rate and rhythm normal S1 and S2. Extremities without edema. Extremities without edema.       Assessment & Plan:  Nonischemic cardiomyopathy  Hypothyroidism-TSH pending  Hyperlipidemia-trial of Zetia 10 mg daily if patient can afford  Chronic anticoagulation-on Coumadin  Essential hypertension  Implantable defibrillator  History of anxiety  Elevated serum creatinine-followed by Dr. Justin Mend  History of gout  Plan: Return in 6 months. Trial of Zetia if affordable. Watch diet. Continue same medications.

## 2015-12-09 NOTE — Patient Instructions (Signed)
Watch diet and try to walk some for exercise. Reviewed high fat foods today. Please have your hearing checked. TSH added to labs previously drawn. Return in the summer for physical exam. Try Zetia 10 mg daily if unable to afford for cholesterol

## 2015-12-10 LAB — TSH: TSH: 2.4 m[IU]/L

## 2015-12-15 DIAGNOSIS — I429 Cardiomyopathy, unspecified: Secondary | ICD-10-CM | POA: Diagnosis not present

## 2015-12-15 DIAGNOSIS — I472 Ventricular tachycardia: Secondary | ICD-10-CM | POA: Diagnosis not present

## 2015-12-15 DIAGNOSIS — Z95810 Presence of automatic (implantable) cardiac defibrillator: Secondary | ICD-10-CM | POA: Diagnosis not present

## 2015-12-15 DIAGNOSIS — I428 Other cardiomyopathies: Secondary | ICD-10-CM | POA: Diagnosis not present

## 2015-12-15 DIAGNOSIS — N183 Chronic kidney disease, stage 3 (moderate): Secondary | ICD-10-CM | POA: Diagnosis not present

## 2015-12-15 DIAGNOSIS — I5022 Chronic systolic (congestive) heart failure: Secondary | ICD-10-CM | POA: Diagnosis not present

## 2015-12-15 DIAGNOSIS — Z7901 Long term (current) use of anticoagulants: Secondary | ICD-10-CM | POA: Diagnosis not present

## 2015-12-15 DIAGNOSIS — E668 Other obesity: Secondary | ICD-10-CM | POA: Diagnosis not present

## 2015-12-15 DIAGNOSIS — E039 Hypothyroidism, unspecified: Secondary | ICD-10-CM | POA: Diagnosis not present

## 2015-12-15 DIAGNOSIS — Q210 Ventricular septal defect: Secondary | ICD-10-CM | POA: Diagnosis not present

## 2015-12-19 IMAGING — MG MM SCREEN MAMMOGRAM BILATERAL
4 series · 4 of 4 positions shown · non-contrast
Comparison: Previous exam(s).

CLINICAL DATA: Screening.

EXAM:
DIGITAL SCREENING BILATERAL MAMMOGRAM WITH CAD

[R CC]
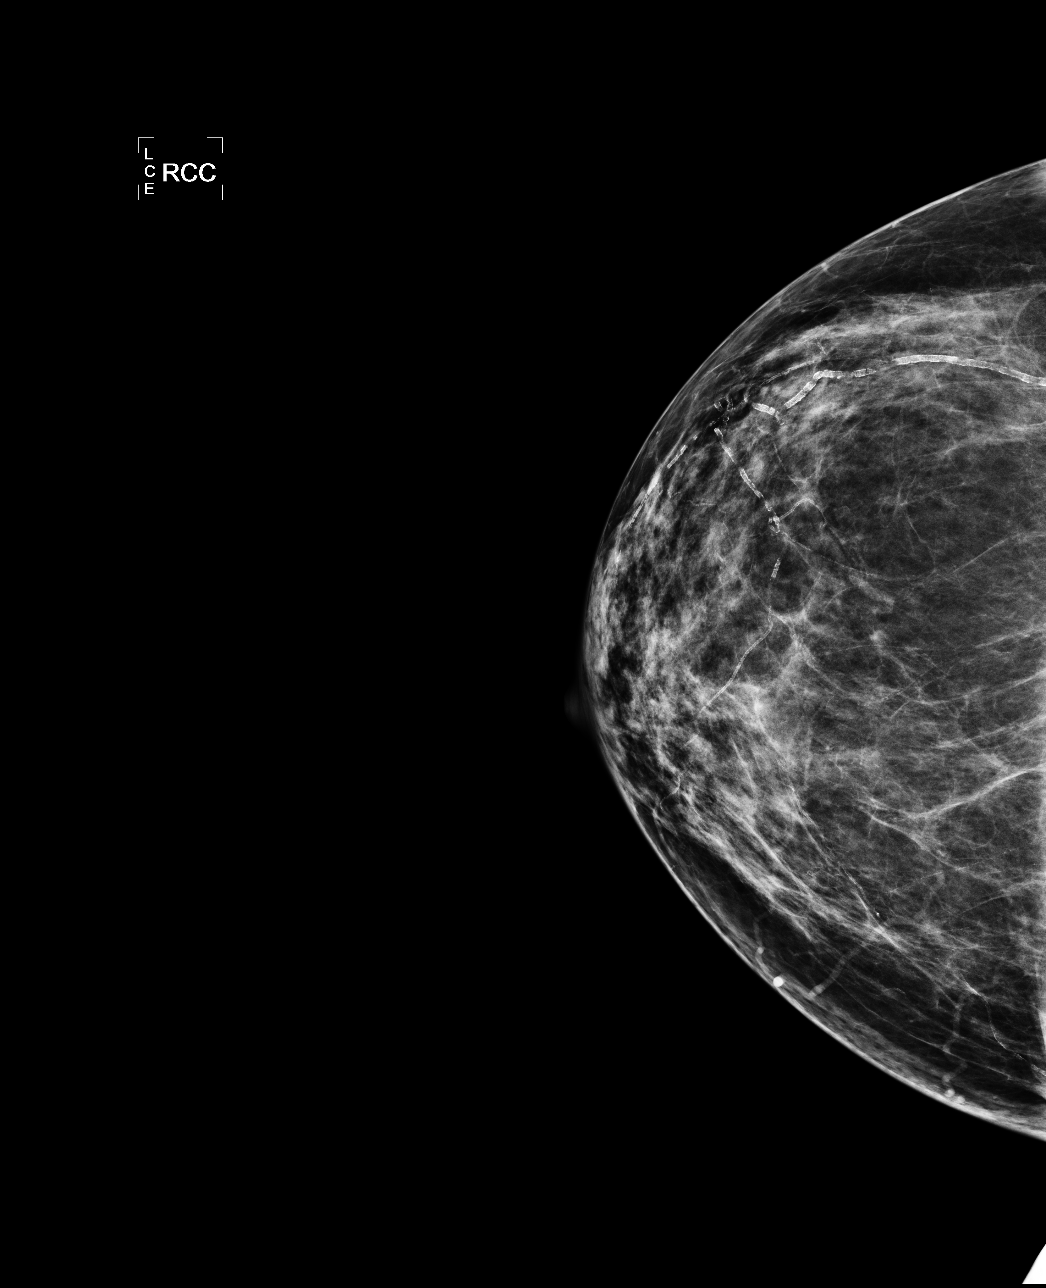

[L CC]
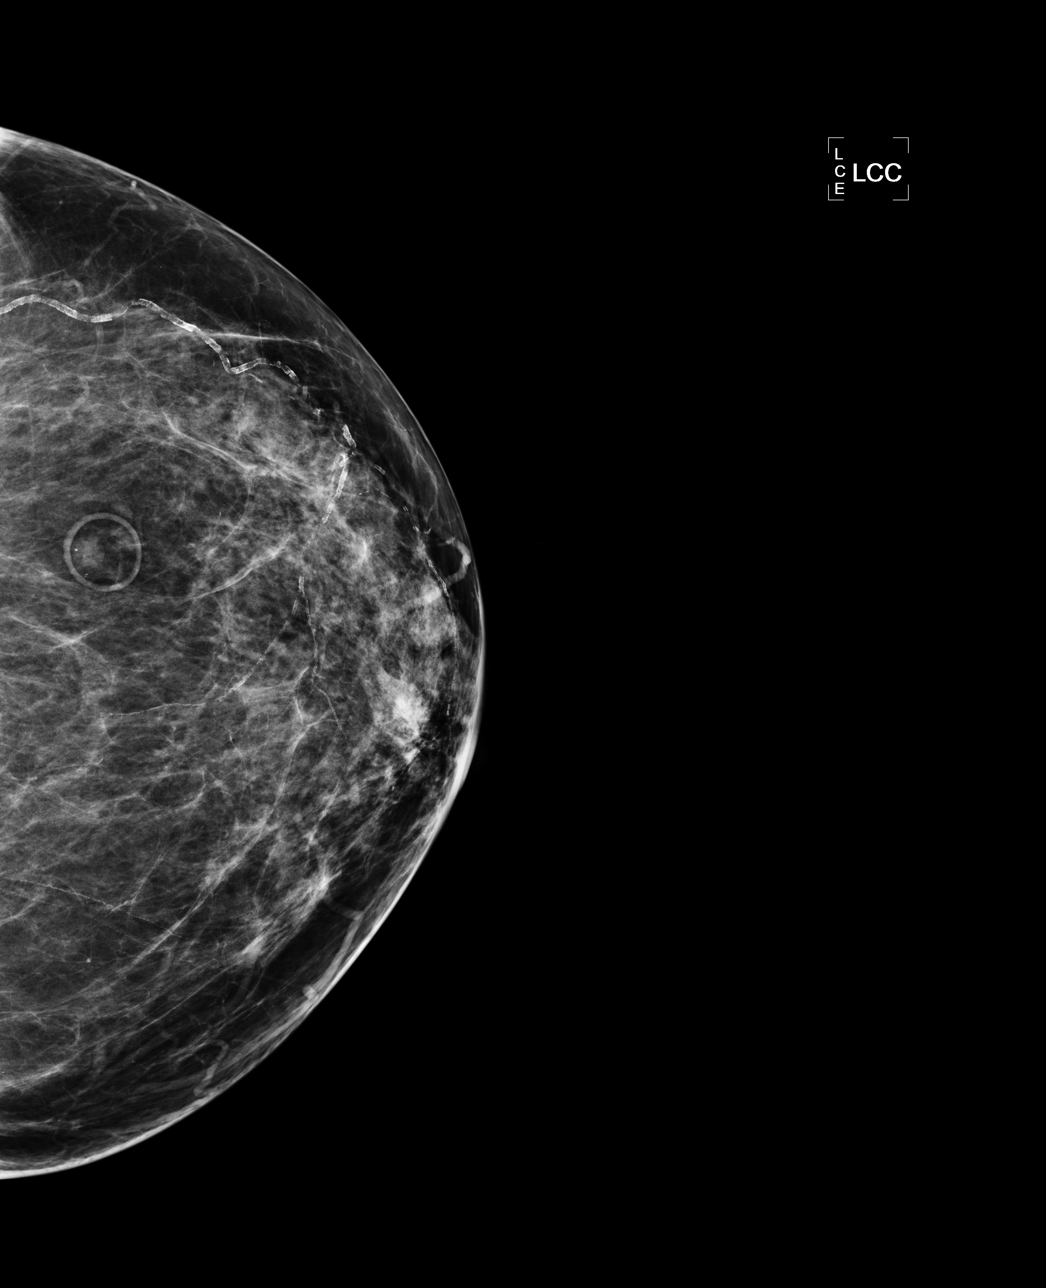

[L MLO]
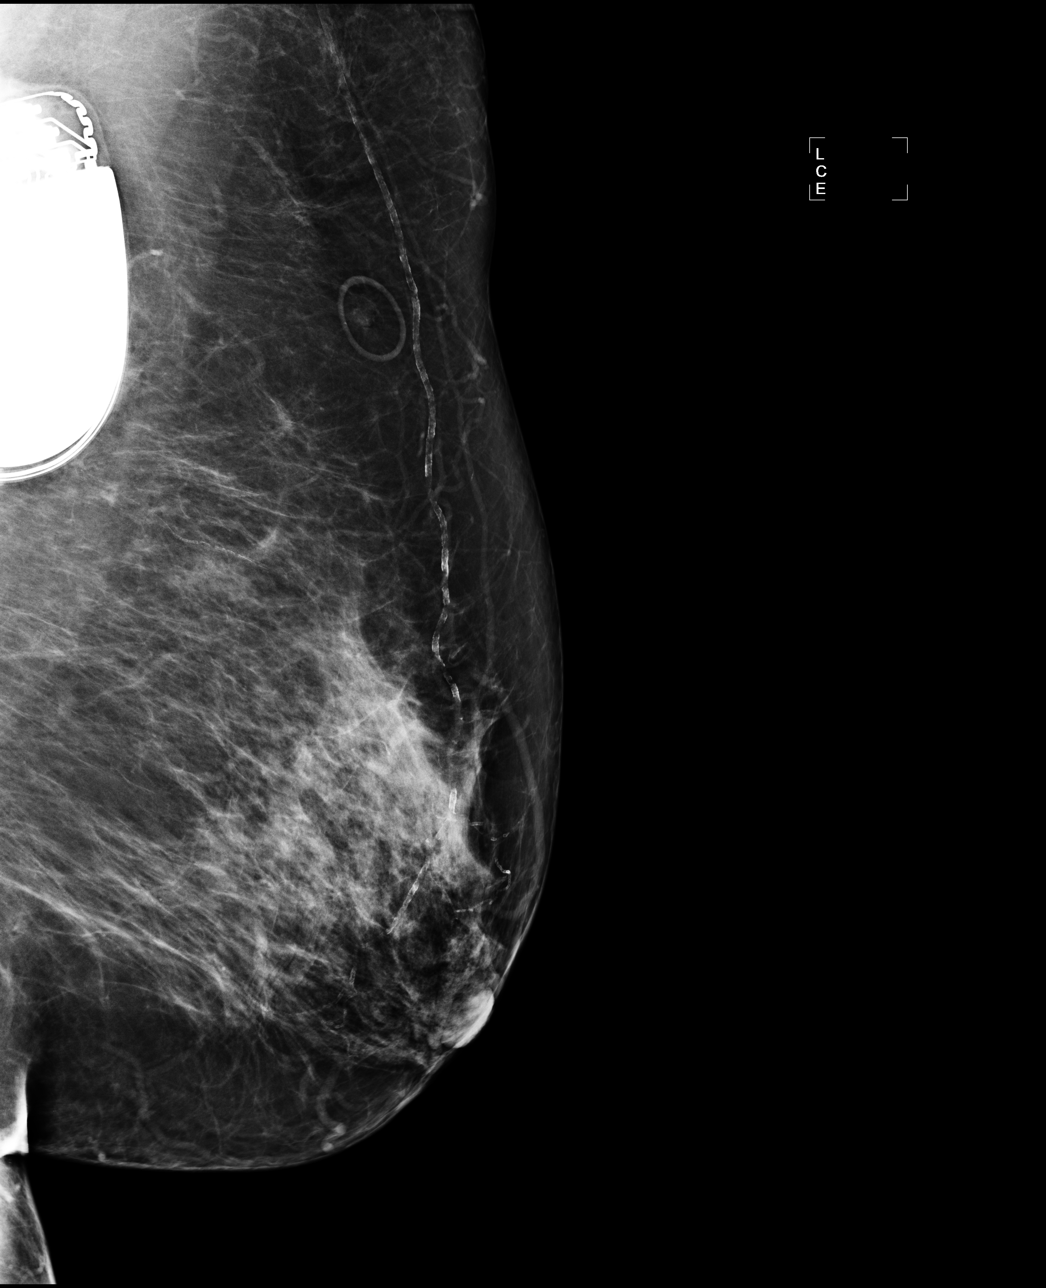

[R MLO]
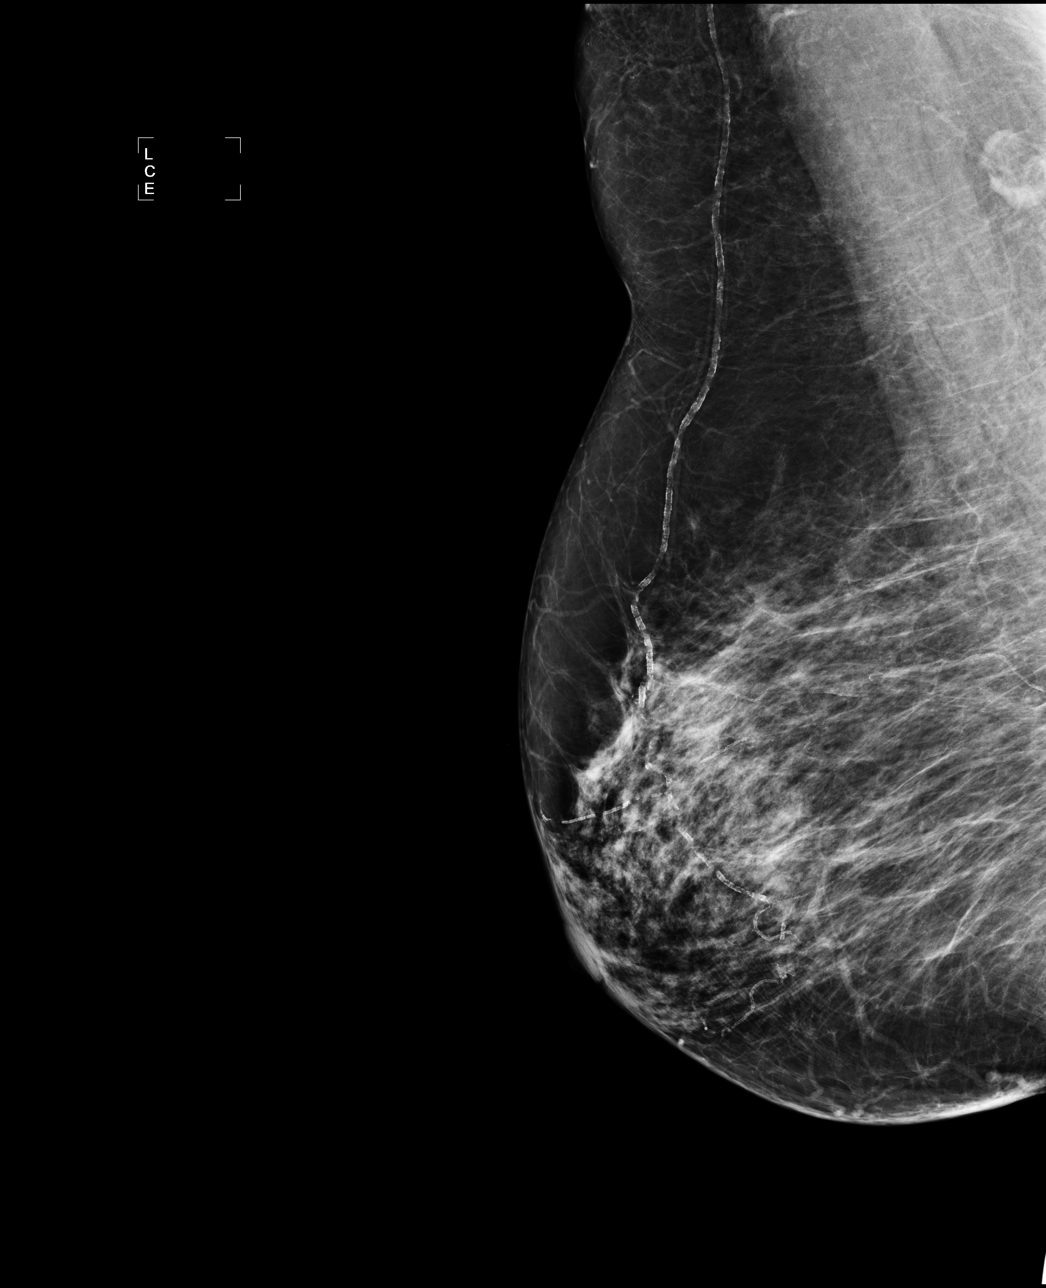

[4 of 4 positions shown; findings below may reference images not displayed]

ACR Breast Density Category c: The breast tissue is heterogeneously
dense, which may obscure small masses.
FINDINGS: There are no findings suspicious for malignancy. Images were
processed with CAD.
IMPRESSION: No mammographic evidence of malignancy. A result letter of this
screening mammogram will be mailed directly to the patient.

RECOMMENDATION:
Screening mammogram in one year. (Code:YJ-2-FEZ)

BI-RADS CATEGORY  1: Negative.

## 2015-12-29 DIAGNOSIS — Z7901 Long term (current) use of anticoagulants: Secondary | ICD-10-CM | POA: Diagnosis not present

## 2015-12-29 DIAGNOSIS — Z95810 Presence of automatic (implantable) cardiac defibrillator: Secondary | ICD-10-CM | POA: Diagnosis not present

## 2015-12-29 DIAGNOSIS — Q210 Ventricular septal defect: Secondary | ICD-10-CM | POA: Diagnosis not present

## 2015-12-29 DIAGNOSIS — E668 Other obesity: Secondary | ICD-10-CM | POA: Diagnosis not present

## 2015-12-29 DIAGNOSIS — I429 Cardiomyopathy, unspecified: Secondary | ICD-10-CM | POA: Diagnosis not present

## 2015-12-29 DIAGNOSIS — N183 Chronic kidney disease, stage 3 (moderate): Secondary | ICD-10-CM | POA: Diagnosis not present

## 2015-12-29 DIAGNOSIS — I472 Ventricular tachycardia: Secondary | ICD-10-CM | POA: Diagnosis not present

## 2015-12-29 DIAGNOSIS — I5022 Chronic systolic (congestive) heart failure: Secondary | ICD-10-CM | POA: Diagnosis not present

## 2015-12-29 DIAGNOSIS — E039 Hypothyroidism, unspecified: Secondary | ICD-10-CM | POA: Diagnosis not present

## 2015-12-29 DIAGNOSIS — I428 Other cardiomyopathies: Secondary | ICD-10-CM | POA: Diagnosis not present

## 2015-12-30 DIAGNOSIS — E668 Other obesity: Secondary | ICD-10-CM | POA: Diagnosis not present

## 2015-12-30 DIAGNOSIS — I429 Cardiomyopathy, unspecified: Secondary | ICD-10-CM | POA: Diagnosis not present

## 2015-12-30 DIAGNOSIS — I472 Ventricular tachycardia: Secondary | ICD-10-CM | POA: Diagnosis not present

## 2015-12-30 DIAGNOSIS — Q210 Ventricular septal defect: Secondary | ICD-10-CM | POA: Diagnosis not present

## 2015-12-30 DIAGNOSIS — N183 Chronic kidney disease, stage 3 (moderate): Secondary | ICD-10-CM | POA: Diagnosis not present

## 2015-12-30 DIAGNOSIS — Z95810 Presence of automatic (implantable) cardiac defibrillator: Secondary | ICD-10-CM | POA: Diagnosis not present

## 2015-12-30 DIAGNOSIS — Z7901 Long term (current) use of anticoagulants: Secondary | ICD-10-CM | POA: Diagnosis not present

## 2015-12-30 DIAGNOSIS — I428 Other cardiomyopathies: Secondary | ICD-10-CM | POA: Diagnosis not present

## 2015-12-30 DIAGNOSIS — E039 Hypothyroidism, unspecified: Secondary | ICD-10-CM | POA: Diagnosis not present

## 2015-12-30 DIAGNOSIS — I5022 Chronic systolic (congestive) heart failure: Secondary | ICD-10-CM | POA: Diagnosis not present

## 2016-01-13 DIAGNOSIS — N183 Chronic kidney disease, stage 3 (moderate): Secondary | ICD-10-CM | POA: Diagnosis not present

## 2016-01-13 DIAGNOSIS — Z7901 Long term (current) use of anticoagulants: Secondary | ICD-10-CM | POA: Diagnosis not present

## 2016-01-13 DIAGNOSIS — I472 Ventricular tachycardia: Secondary | ICD-10-CM | POA: Diagnosis not present

## 2016-01-13 DIAGNOSIS — E039 Hypothyroidism, unspecified: Secondary | ICD-10-CM | POA: Diagnosis not present

## 2016-01-13 DIAGNOSIS — Z95810 Presence of automatic (implantable) cardiac defibrillator: Secondary | ICD-10-CM | POA: Diagnosis not present

## 2016-01-13 DIAGNOSIS — I429 Cardiomyopathy, unspecified: Secondary | ICD-10-CM | POA: Diagnosis not present

## 2016-01-13 DIAGNOSIS — Q210 Ventricular septal defect: Secondary | ICD-10-CM | POA: Diagnosis not present

## 2016-01-13 DIAGNOSIS — I428 Other cardiomyopathies: Secondary | ICD-10-CM | POA: Diagnosis not present

## 2016-01-13 DIAGNOSIS — E668 Other obesity: Secondary | ICD-10-CM | POA: Diagnosis not present

## 2016-01-13 DIAGNOSIS — I5022 Chronic systolic (congestive) heart failure: Secondary | ICD-10-CM | POA: Diagnosis not present

## 2016-01-27 DIAGNOSIS — E039 Hypothyroidism, unspecified: Secondary | ICD-10-CM | POA: Diagnosis not present

## 2016-01-27 DIAGNOSIS — I5022 Chronic systolic (congestive) heart failure: Secondary | ICD-10-CM | POA: Diagnosis not present

## 2016-01-27 DIAGNOSIS — I428 Other cardiomyopathies: Secondary | ICD-10-CM | POA: Diagnosis not present

## 2016-01-27 DIAGNOSIS — N183 Chronic kidney disease, stage 3 (moderate): Secondary | ICD-10-CM | POA: Diagnosis not present

## 2016-01-27 DIAGNOSIS — Q210 Ventricular septal defect: Secondary | ICD-10-CM | POA: Diagnosis not present

## 2016-01-27 DIAGNOSIS — Z7901 Long term (current) use of anticoagulants: Secondary | ICD-10-CM | POA: Diagnosis not present

## 2016-01-27 DIAGNOSIS — E668 Other obesity: Secondary | ICD-10-CM | POA: Diagnosis not present

## 2016-01-27 DIAGNOSIS — I429 Cardiomyopathy, unspecified: Secondary | ICD-10-CM | POA: Diagnosis not present

## 2016-01-27 DIAGNOSIS — Z95810 Presence of automatic (implantable) cardiac defibrillator: Secondary | ICD-10-CM | POA: Diagnosis not present

## 2016-01-27 DIAGNOSIS — I472 Ventricular tachycardia: Secondary | ICD-10-CM | POA: Diagnosis not present

## 2016-02-21 ENCOUNTER — Ambulatory Visit (INDEPENDENT_AMBULATORY_CARE_PROVIDER_SITE_OTHER): Payer: Medicare Other | Admitting: Internal Medicine

## 2016-02-21 DIAGNOSIS — Z23 Encounter for immunization: Secondary | ICD-10-CM

## 2016-02-24 DIAGNOSIS — N183 Chronic kidney disease, stage 3 (moderate): Secondary | ICD-10-CM | POA: Diagnosis not present

## 2016-02-24 DIAGNOSIS — I428 Other cardiomyopathies: Secondary | ICD-10-CM | POA: Diagnosis not present

## 2016-02-24 DIAGNOSIS — I472 Ventricular tachycardia: Secondary | ICD-10-CM | POA: Diagnosis not present

## 2016-02-24 DIAGNOSIS — Q210 Ventricular septal defect: Secondary | ICD-10-CM | POA: Diagnosis not present

## 2016-02-24 DIAGNOSIS — E039 Hypothyroidism, unspecified: Secondary | ICD-10-CM | POA: Diagnosis not present

## 2016-02-24 DIAGNOSIS — Z7901 Long term (current) use of anticoagulants: Secondary | ICD-10-CM | POA: Diagnosis not present

## 2016-02-24 DIAGNOSIS — Z95810 Presence of automatic (implantable) cardiac defibrillator: Secondary | ICD-10-CM | POA: Diagnosis not present

## 2016-02-24 DIAGNOSIS — E668 Other obesity: Secondary | ICD-10-CM | POA: Diagnosis not present

## 2016-02-24 DIAGNOSIS — I5022 Chronic systolic (congestive) heart failure: Secondary | ICD-10-CM | POA: Diagnosis not present

## 2016-02-24 DIAGNOSIS — I429 Cardiomyopathy, unspecified: Secondary | ICD-10-CM | POA: Diagnosis not present

## 2016-03-09 ENCOUNTER — Telehealth: Payer: Self-pay | Admitting: *Deleted

## 2016-03-09 DIAGNOSIS — H401132 Primary open-angle glaucoma, bilateral, moderate stage: Secondary | ICD-10-CM | POA: Diagnosis not present

## 2016-03-09 DIAGNOSIS — H35371 Puckering of macula, right eye: Secondary | ICD-10-CM | POA: Diagnosis not present

## 2016-03-09 DIAGNOSIS — E119 Type 2 diabetes mellitus without complications: Secondary | ICD-10-CM | POA: Diagnosis not present

## 2016-03-09 LAB — HM DIABETES EYE EXAM

## 2016-03-09 NOTE — Telephone Encounter (Signed)
Fax received from [facility], Pt had Diabetic Eye Exam on 03/09/16, Results showed no diabetic retinopathy detected. Form sent to be scanned into chart.

## 2016-03-10 DIAGNOSIS — E039 Hypothyroidism, unspecified: Secondary | ICD-10-CM | POA: Diagnosis not present

## 2016-03-10 DIAGNOSIS — I428 Other cardiomyopathies: Secondary | ICD-10-CM | POA: Diagnosis not present

## 2016-03-10 DIAGNOSIS — I429 Cardiomyopathy, unspecified: Secondary | ICD-10-CM | POA: Diagnosis not present

## 2016-03-10 DIAGNOSIS — Q210 Ventricular septal defect: Secondary | ICD-10-CM | POA: Diagnosis not present

## 2016-03-10 DIAGNOSIS — I472 Ventricular tachycardia: Secondary | ICD-10-CM | POA: Diagnosis not present

## 2016-03-10 DIAGNOSIS — Z95810 Presence of automatic (implantable) cardiac defibrillator: Secondary | ICD-10-CM | POA: Diagnosis not present

## 2016-03-10 DIAGNOSIS — Z7901 Long term (current) use of anticoagulants: Secondary | ICD-10-CM | POA: Diagnosis not present

## 2016-03-10 DIAGNOSIS — I5022 Chronic systolic (congestive) heart failure: Secondary | ICD-10-CM | POA: Diagnosis not present

## 2016-03-10 DIAGNOSIS — N183 Chronic kidney disease, stage 3 (moderate): Secondary | ICD-10-CM | POA: Diagnosis not present

## 2016-03-10 DIAGNOSIS — E668 Other obesity: Secondary | ICD-10-CM | POA: Diagnosis not present

## 2016-03-30 DIAGNOSIS — I428 Other cardiomyopathies: Secondary | ICD-10-CM | POA: Diagnosis not present

## 2016-03-30 DIAGNOSIS — I5022 Chronic systolic (congestive) heart failure: Secondary | ICD-10-CM | POA: Diagnosis not present

## 2016-03-30 DIAGNOSIS — Z7901 Long term (current) use of anticoagulants: Secondary | ICD-10-CM | POA: Diagnosis not present

## 2016-03-30 DIAGNOSIS — Z95810 Presence of automatic (implantable) cardiac defibrillator: Secondary | ICD-10-CM | POA: Diagnosis not present

## 2016-03-30 DIAGNOSIS — Q210 Ventricular septal defect: Secondary | ICD-10-CM | POA: Diagnosis not present

## 2016-03-30 DIAGNOSIS — N183 Chronic kidney disease, stage 3 (moderate): Secondary | ICD-10-CM | POA: Diagnosis not present

## 2016-03-30 DIAGNOSIS — E668 Other obesity: Secondary | ICD-10-CM | POA: Diagnosis not present

## 2016-03-30 DIAGNOSIS — I429 Cardiomyopathy, unspecified: Secondary | ICD-10-CM | POA: Diagnosis not present

## 2016-03-30 DIAGNOSIS — I472 Ventricular tachycardia: Secondary | ICD-10-CM | POA: Diagnosis not present

## 2016-03-30 DIAGNOSIS — E039 Hypothyroidism, unspecified: Secondary | ICD-10-CM | POA: Diagnosis not present

## 2016-04-11 DIAGNOSIS — Z95810 Presence of automatic (implantable) cardiac defibrillator: Secondary | ICD-10-CM | POA: Diagnosis not present

## 2016-04-11 DIAGNOSIS — Z7901 Long term (current) use of anticoagulants: Secondary | ICD-10-CM | POA: Diagnosis not present

## 2016-04-11 DIAGNOSIS — I472 Ventricular tachycardia: Secondary | ICD-10-CM | POA: Diagnosis not present

## 2016-04-11 DIAGNOSIS — N183 Chronic kidney disease, stage 3 (moderate): Secondary | ICD-10-CM | POA: Diagnosis not present

## 2016-04-11 DIAGNOSIS — E668 Other obesity: Secondary | ICD-10-CM | POA: Diagnosis not present

## 2016-04-11 DIAGNOSIS — E039 Hypothyroidism, unspecified: Secondary | ICD-10-CM | POA: Diagnosis not present

## 2016-04-11 DIAGNOSIS — Q210 Ventricular septal defect: Secondary | ICD-10-CM | POA: Diagnosis not present

## 2016-04-11 DIAGNOSIS — I428 Other cardiomyopathies: Secondary | ICD-10-CM | POA: Diagnosis not present

## 2016-04-11 DIAGNOSIS — I429 Cardiomyopathy, unspecified: Secondary | ICD-10-CM | POA: Diagnosis not present

## 2016-04-11 DIAGNOSIS — I5022 Chronic systolic (congestive) heart failure: Secondary | ICD-10-CM | POA: Diagnosis not present

## 2016-04-21 DIAGNOSIS — D631 Anemia in chronic kidney disease: Secondary | ICD-10-CM | POA: Diagnosis not present

## 2016-04-21 DIAGNOSIS — N183 Chronic kidney disease, stage 3 (moderate): Secondary | ICD-10-CM | POA: Diagnosis not present

## 2016-04-21 DIAGNOSIS — Z23 Encounter for immunization: Secondary | ICD-10-CM | POA: Diagnosis not present

## 2016-04-21 DIAGNOSIS — N2581 Secondary hyperparathyroidism of renal origin: Secondary | ICD-10-CM | POA: Diagnosis not present

## 2016-04-21 DIAGNOSIS — R7989 Other specified abnormal findings of blood chemistry: Secondary | ICD-10-CM | POA: Diagnosis not present

## 2016-04-21 DIAGNOSIS — Z6830 Body mass index (BMI) 30.0-30.9, adult: Secondary | ICD-10-CM | POA: Diagnosis not present

## 2016-04-21 DIAGNOSIS — R809 Proteinuria, unspecified: Secondary | ICD-10-CM | POA: Diagnosis not present

## 2016-05-02 ENCOUNTER — Other Ambulatory Visit: Payer: Self-pay | Admitting: Internal Medicine

## 2016-05-09 DIAGNOSIS — E039 Hypothyroidism, unspecified: Secondary | ICD-10-CM | POA: Diagnosis not present

## 2016-05-09 DIAGNOSIS — E668 Other obesity: Secondary | ICD-10-CM | POA: Diagnosis not present

## 2016-05-09 DIAGNOSIS — Z95810 Presence of automatic (implantable) cardiac defibrillator: Secondary | ICD-10-CM | POA: Diagnosis not present

## 2016-05-09 DIAGNOSIS — Q210 Ventricular septal defect: Secondary | ICD-10-CM | POA: Diagnosis not present

## 2016-05-09 DIAGNOSIS — Z7901 Long term (current) use of anticoagulants: Secondary | ICD-10-CM | POA: Diagnosis not present

## 2016-05-09 DIAGNOSIS — N183 Chronic kidney disease, stage 3 (moderate): Secondary | ICD-10-CM | POA: Diagnosis not present

## 2016-05-09 DIAGNOSIS — I5022 Chronic systolic (congestive) heart failure: Secondary | ICD-10-CM | POA: Diagnosis not present

## 2016-05-09 DIAGNOSIS — I472 Ventricular tachycardia: Secondary | ICD-10-CM | POA: Diagnosis not present

## 2016-05-09 DIAGNOSIS — I429 Cardiomyopathy, unspecified: Secondary | ICD-10-CM | POA: Diagnosis not present

## 2016-05-09 DIAGNOSIS — I428 Other cardiomyopathies: Secondary | ICD-10-CM | POA: Diagnosis not present

## 2016-05-19 ENCOUNTER — Encounter: Payer: Self-pay | Admitting: Internal Medicine

## 2016-05-25 ENCOUNTER — Encounter: Payer: Medicare Other | Admitting: Internal Medicine

## 2016-05-29 DIAGNOSIS — N183 Chronic kidney disease, stage 3 (moderate): Secondary | ICD-10-CM | POA: Diagnosis not present

## 2016-05-30 NOTE — Progress Notes (Signed)
Electrophysiology Office Note Date: 05/31/2016  ID:  Galilea, Quito 09-30-1934, MRN [id number]  PCP: Elby Showers, MD Primary Cardiologist: Wynonia Lawman Electrophysiologist: Caryl Comes  CC: Routine ICD follow-up  Casey [name] is a 81 y.o. female seen today for Dr Caryl Comes.  She presents today for routine electrophysiology followup.  Since last being seen in our clinic, the patient reports doing reasonably well.  She remains closely followed by Dr Wynonia Lawman.   Her husband is having some significant medical problems with lack of mobility and shortness of breath that is causing increased stress. She has tried to convince him to be seen by a physician, but he continues to decline.  She denies chest pain, palpitations, dyspnea, PND, orthopnea, nausea, vomiting, dizziness, syncope, edema, weight gain, or early satiety.  She has not had ICD shocks.   Device History: MDT CRTD implanted 2005 for NICM, CHF; RV lead revision 2007; gen change 2009; gen change 2014 History of appropriate therapy: Yes History of AAD therapy: Yes - amiodarone    Past Medical History:  Diagnosis Date  . Biventricular ICD (implantable cardiac defibrillator) in place    medtronic   . Chronic systolic heart failure ([place])   . Hypertension   . Nonischemic cardiomyopathy ([place])   . VT (ventricular tachycardia) ([place])    appropriate therapy via ICD 2013   Past Surgical History:  Procedure Laterality Date  . BIV ICD GENERTAOR CHANGE OUT N/A 04/16/2013   Procedure: BIV ICD GENERTAOR CHANGE OUT;  Surgeon: Deboraha Sprang, MD;  Location: [facility] CATH LAB;  Service: Cardiovascular;  Laterality: N/A;    Current Outpatient Prescriptions  Medication Sig Dispense Refill  . allopurinol (ZYLOPRIM) 100 MG tablet TAKE 1 TABLET BY MOUTH EVERY DAY 30 tablet 11  . amiodarone (PACERONE) 200 MG tablet Take 200 mg by mouth daily.    . furosemide (LASIX) 40 MG tablet Take 40 mg by mouth 2 (two) times daily.     Marland Kitchen levothyroxine  (SYNTHROID, LEVOTHROID) 50 MCG tablet Take 50 mcg by mouth daily before breakfast.    . lisinopril (PRINIVIL,ZESTRIL) 20 MG tablet Take 20 mg by mouth daily.     . meclizine (ANTIVERT) 25 MG tablet Take 1 tablet (25 mg total) by mouth 3 (three) times daily as needed for dizziness. 30 tablet 0  . metoprolol (LOPRESSOR) 50 MG tablet Take 50 mg by mouth daily.     . Multiple Vitamins-Calcium (ONE-A-DAY WOMENS FORMULA) TABS Take 1 tablet by mouth daily.      . potassium chloride SA (K-DUR,KLOR-CON) 20 MEQ tablet Take 20 mEq by mouth daily.     . vitamin D, CHOLECALCIFEROL, 400 UNITS tablet Take 800 Units by mouth daily.    Marland Kitchen warfarin (COUMADIN) 2.5 MG tablet Take 2.5 mg by mouth daily. T-TH-Sat-Sun    . warfarin (COUMADIN) 3 MG tablet Take 3 mg by mouth daily. M-W-F     No current facility-administered medications for this visit.     Allergies:   Codeine   Social History: Social History   Social History  . Marital status: Married    Spouse name: N/A  . Number of children: N/A  . Years of education: N/A   Occupational History  . Not on file.   Social History Main Topics  . Smoking status: Never Smoker  . Smokeless tobacco: Never Used  . Alcohol use No  . Drug use: No  . Sexual activity: Not on file   Other Topics Concern  . Not on file  Social History Narrative  . No narrative on file    Family History: Family History  Problem Relation Age of Onset  . Heart failure Mother   . Cancer Father     Review of Systems: All other systems reviewed and are otherwise negative except as noted above.   Physical Exam: VS:  BP 135/78   Pulse 68   Ht 5\' 2"  (1.575 m)   Wt 170 lb (77.1 kg)   BMI 31.09 kg/m  , BMI Body mass index is 31.09 kg/m.  GEN- The patient is well appearing, alert and oriented x 3 today.   HEENT: normocephalic, atraumatic; sclera clear, conjunctiva pink; hearing intact; oropharynx clear; neck supple  Lungs- Clear to ausculation bilaterally, normal work  of breathing.  No wheezes, rales, rhonchi Heart- Regular rate and rhythm (paced) GI- soft, non-tender, non-distended, bowel sounds present  Extremities- no clubbing, cyanosis, or edema; DP/PT/radial pulses 2+ bilaterally MS- no significant deformity or atrophy Skin- warm and dry, no rash or lesion; ICD pocket well healed Psych- euthymic mood, full affect Neuro- strength and sensation are intact  ICD interrogation- reviewed in detail today,  See PACEART report  EKG:  EKG is not ordered today.  Recent Labs: 06/08/2015: BUN 41; Creat 2.14; Hemoglobin 14.7; Platelets 133; Potassium 4.5; Sodium 141 12/07/2015: ALT 17; TSH 2.40   Wt Readings from Last 3 Encounters:  05/31/16 170 lb (77.1 kg)  12/09/15 164 lb (74.4 kg)  06/10/15 169 lb (76.7 kg)     Other studies Reviewed: Additional studies/ records that were reviewed today include: Dr Olin Pia office notes  Assessment and Plan:  1.  Chronic systolic dysfunction euvolemic today Stable on an appropriate medical regimen Normal ICD function See [facility] report No changes today Carelink followed by Dr Thurman Coyer office  2.  Ventricular tachycardia No recent recurrence Continue amiodarone LFT's, TSH followed by Dr Wynonia Lawman. Annual eye exams recommended  3.  HTN Stable No change required today    Current medicines are reviewed at length with the patient today.   The patient does not have concerns regarding her medicines.  The following changes were made today:  none  Labs/ tests ordered today include: none No orders of the defined types were placed in this encounter.    Disposition:   Follow up with Dr Caryl Comes in 1 year. Dr Wynonia Lawman as scheduled     Signed, Chanetta Marshall, NP 05/31/2016 11:34 AM  [facility] [address] [place] [address] [phone] (office) [phone] (fax)

## 2016-05-31 ENCOUNTER — Ambulatory Visit (INDEPENDENT_AMBULATORY_CARE_PROVIDER_SITE_OTHER): Payer: Medicare Other | Admitting: Nurse Practitioner

## 2016-05-31 VITALS — BP 135/78 | HR 68 | Ht 62.0 in | Wt 170.0 lb

## 2016-05-31 DIAGNOSIS — I472 Ventricular tachycardia, unspecified: Secondary | ICD-10-CM

## 2016-05-31 DIAGNOSIS — I5022 Chronic systolic (congestive) heart failure: Secondary | ICD-10-CM | POA: Diagnosis not present

## 2016-05-31 DIAGNOSIS — I10 Essential (primary) hypertension: Secondary | ICD-10-CM | POA: Diagnosis not present

## 2016-05-31 LAB — CUP PACEART INCLINIC DEVICE CHECK
Implantable Lead Implant Date: 20071119
Implantable Lead Location: 753858
Implantable Lead Model: 4193
Implantable Lead Model: 5076
Implantable Lead Model: 6947
MDC IDC LEAD IMPLANT DT: 20050525
MDC IDC LEAD IMPLANT DT: 20050525
MDC IDC LEAD LOCATION: 753859
MDC IDC LEAD LOCATION: 753860
MDC IDC PG IMPLANT DT: 20141105
MDC IDC SESS DTM: 20171220120453

## 2016-05-31 NOTE — Patient Instructions (Signed)
Medication Instructions:   Your physician recommends that you continue on your current medications as directed. Please refer to the Current Medication list given to you today.   If you need a refill on your cardiac medications before your next appointment, please call your pharmacy.  Labwork: NONE ORDERED  TODAY    Testing/Procedures: NONE ORDERED  TODAY    Follow-Up: Your physician wants you to follow-up in: ONE YEAR WITH  KLEIN   You will receive a reminder letter in the mail two months in advance. If you don't receive a letter, please call our office to schedule the follow-up appointment.     Any Other Special Instructions Will Be Listed Below (If Applicable).                                                                                                                                                    

## 2016-06-07 DIAGNOSIS — Z95810 Presence of automatic (implantable) cardiac defibrillator: Secondary | ICD-10-CM | POA: Diagnosis not present

## 2016-06-07 DIAGNOSIS — N183 Chronic kidney disease, stage 3 (moderate): Secondary | ICD-10-CM | POA: Diagnosis not present

## 2016-06-07 DIAGNOSIS — I472 Ventricular tachycardia: Secondary | ICD-10-CM | POA: Diagnosis not present

## 2016-06-07 DIAGNOSIS — I428 Other cardiomyopathies: Secondary | ICD-10-CM | POA: Diagnosis not present

## 2016-06-07 DIAGNOSIS — E668 Other obesity: Secondary | ICD-10-CM | POA: Diagnosis not present

## 2016-06-07 DIAGNOSIS — I429 Cardiomyopathy, unspecified: Secondary | ICD-10-CM | POA: Diagnosis not present

## 2016-06-07 DIAGNOSIS — Z7901 Long term (current) use of anticoagulants: Secondary | ICD-10-CM | POA: Diagnosis not present

## 2016-06-07 DIAGNOSIS — I5022 Chronic systolic (congestive) heart failure: Secondary | ICD-10-CM | POA: Diagnosis not present

## 2016-06-07 DIAGNOSIS — E039 Hypothyroidism, unspecified: Secondary | ICD-10-CM | POA: Diagnosis not present

## 2016-06-07 DIAGNOSIS — Q210 Ventricular septal defect: Secondary | ICD-10-CM | POA: Diagnosis not present

## 2016-06-08 ENCOUNTER — Other Ambulatory Visit: Payer: Medicare Other | Admitting: Internal Medicine

## 2016-06-13 ENCOUNTER — Other Ambulatory Visit: Payer: Medicare Other | Admitting: Internal Medicine

## 2016-06-13 DIAGNOSIS — E559 Vitamin D deficiency, unspecified: Secondary | ICD-10-CM

## 2016-06-13 DIAGNOSIS — E039 Hypothyroidism, unspecified: Secondary | ICD-10-CM

## 2016-06-13 DIAGNOSIS — R7302 Impaired glucose tolerance (oral): Secondary | ICD-10-CM | POA: Diagnosis not present

## 2016-06-13 DIAGNOSIS — Z0000 Encounter for general adult medical examination without abnormal findings: Secondary | ICD-10-CM

## 2016-06-13 DIAGNOSIS — E785 Hyperlipidemia, unspecified: Secondary | ICD-10-CM

## 2016-06-13 LAB — LIPID PANEL
CHOLESTEROL: 267 mg/dL — AB (ref <200)
HDL: 55 mg/dL (ref >50)
LDL Cholesterol: 181 mg/dL — ABNORMAL HIGH (ref <100)
TRIGLYCERIDES: 156 mg/dL — AB (ref <150)
Total CHOL/HDL Ratio: 4.9 Ratio (ref <5.0)
VLDL: 31 mg/dL — ABNORMAL HIGH (ref <30)

## 2016-06-13 LAB — CBC WITH DIFFERENTIAL/PLATELET
BASOS ABS: 44 {cells}/uL (ref 0–200)
Basophils Relative: 1 %
EOS ABS: 132 {cells}/uL (ref 15–500)
Eosinophils Relative: 3 %
HEMATOCRIT: 41.5 % (ref 35.0–45.0)
Hemoglobin: 13.7 g/dL (ref 11.7–15.5)
LYMPHS PCT: 22 %
Lymphs Abs: 968 cells/uL (ref 850–3900)
MCH: 34.3 pg — AB (ref 27.0–33.0)
MCHC: 33 g/dL (ref 32.0–36.0)
MCV: 103.8 fL — AB (ref 80.0–100.0)
MONO ABS: 572 {cells}/uL (ref 200–950)
MONOS PCT: 13 %
MPV: 12.9 fL — ABNORMAL HIGH (ref 7.5–12.5)
Neutro Abs: 2684 cells/uL (ref 1500–7800)
Neutrophils Relative %: 61 %
Platelets: 136 10*3/uL — ABNORMAL LOW (ref 140–400)
RBC: 4 MIL/uL (ref 3.80–5.10)
RDW: 13.9 % (ref 11.0–15.0)
WBC: 4.4 10*3/uL (ref 3.8–10.8)

## 2016-06-13 LAB — COMPLETE METABOLIC PANEL WITH GFR
ALT: 18 U/L (ref 6–29)
AST: 20 U/L (ref 10–35)
Albumin: 3.9 g/dL (ref 3.6–5.1)
Alkaline Phosphatase: 114 U/L (ref 33–130)
BILIRUBIN TOTAL: 1 mg/dL (ref 0.2–1.2)
BUN: 32 mg/dL — ABNORMAL HIGH (ref 7–25)
CALCIUM: 9.2 mg/dL (ref 8.6–10.4)
CO2: 25 mmol/L (ref 20–31)
CREATININE: 1.55 mg/dL — AB (ref 0.60–0.93)
Chloride: 109 mmol/L (ref 98–110)
GFR, EST AFRICAN AMERICAN: 36 mL/min — AB (ref >=60)
GFR, EST NON AFRICAN AMERICAN: 32 mL/min — AB (ref >=60)
Glucose, Bld: 108 mg/dL — ABNORMAL HIGH (ref 65–99)
Potassium: 4.3 mmol/L (ref 3.5–5.3)
Sodium: 143 mmol/L (ref 135–146)
TOTAL PROTEIN: 6.3 g/dL (ref 6.1–8.1)

## 2016-06-13 LAB — TSH: TSH: 2.29 mIU/L

## 2016-06-13 NOTE — Addendum Note (Signed)
Addended by: Amado Coe on: 06/13/2016 10:11 AM   Modules accepted: Orders

## 2016-06-14 LAB — HEMOGLOBIN A1C
Hgb A1c MFr Bld: 5.5 % (ref <5.7)
MEAN PLASMA GLUCOSE: 111 mg/dL

## 2016-06-14 LAB — VITAMIN D 25 HYDROXY (VIT D DEFICIENCY, FRACTURES): VIT D 25 HYDROXY: 23 ng/mL — AB (ref 30–100)

## 2016-06-15 ENCOUNTER — Encounter: Payer: Medicare Other | Admitting: Internal Medicine

## 2016-06-29 DIAGNOSIS — Z95810 Presence of automatic (implantable) cardiac defibrillator: Secondary | ICD-10-CM | POA: Diagnosis not present

## 2016-06-29 DIAGNOSIS — I5022 Chronic systolic (congestive) heart failure: Secondary | ICD-10-CM | POA: Diagnosis not present

## 2016-06-29 DIAGNOSIS — E039 Hypothyroidism, unspecified: Secondary | ICD-10-CM | POA: Diagnosis not present

## 2016-06-29 DIAGNOSIS — E668 Other obesity: Secondary | ICD-10-CM | POA: Diagnosis not present

## 2016-06-29 DIAGNOSIS — I472 Ventricular tachycardia: Secondary | ICD-10-CM | POA: Diagnosis not present

## 2016-06-29 DIAGNOSIS — Z7901 Long term (current) use of anticoagulants: Secondary | ICD-10-CM | POA: Diagnosis not present

## 2016-06-29 DIAGNOSIS — I429 Cardiomyopathy, unspecified: Secondary | ICD-10-CM | POA: Diagnosis not present

## 2016-06-29 DIAGNOSIS — Q210 Ventricular septal defect: Secondary | ICD-10-CM | POA: Diagnosis not present

## 2016-06-29 DIAGNOSIS — I428 Other cardiomyopathies: Secondary | ICD-10-CM | POA: Diagnosis not present

## 2016-06-29 DIAGNOSIS — N183 Chronic kidney disease, stage 3 (moderate): Secondary | ICD-10-CM | POA: Diagnosis not present

## 2016-07-05 DIAGNOSIS — E039 Hypothyroidism, unspecified: Secondary | ICD-10-CM | POA: Diagnosis not present

## 2016-07-05 DIAGNOSIS — I428 Other cardiomyopathies: Secondary | ICD-10-CM | POA: Diagnosis not present

## 2016-07-05 DIAGNOSIS — Z7901 Long term (current) use of anticoagulants: Secondary | ICD-10-CM | POA: Diagnosis not present

## 2016-07-05 DIAGNOSIS — Z95810 Presence of automatic (implantable) cardiac defibrillator: Secondary | ICD-10-CM | POA: Diagnosis not present

## 2016-07-05 DIAGNOSIS — I472 Ventricular tachycardia: Secondary | ICD-10-CM | POA: Diagnosis not present

## 2016-07-05 DIAGNOSIS — I5022 Chronic systolic (congestive) heart failure: Secondary | ICD-10-CM | POA: Diagnosis not present

## 2016-07-05 DIAGNOSIS — I429 Cardiomyopathy, unspecified: Secondary | ICD-10-CM | POA: Diagnosis not present

## 2016-07-05 DIAGNOSIS — E668 Other obesity: Secondary | ICD-10-CM | POA: Diagnosis not present

## 2016-07-05 DIAGNOSIS — Q210 Ventricular septal defect: Secondary | ICD-10-CM | POA: Diagnosis not present

## 2016-07-05 DIAGNOSIS — N183 Chronic kidney disease, stage 3 (moderate): Secondary | ICD-10-CM | POA: Diagnosis not present

## 2016-07-06 ENCOUNTER — Ambulatory Visit (INDEPENDENT_AMBULATORY_CARE_PROVIDER_SITE_OTHER): Payer: Medicare Other | Admitting: Internal Medicine

## 2016-07-06 ENCOUNTER — Encounter: Payer: Self-pay | Admitting: Internal Medicine

## 2016-07-06 VITALS — BP 102/80 | HR 66 | Temp 96.9°F | Ht 62.0 in | Wt 168.0 lb

## 2016-07-06 DIAGNOSIS — Z8739 Personal history of other diseases of the musculoskeletal system and connective tissue: Secondary | ICD-10-CM

## 2016-07-06 DIAGNOSIS — Z0000 Encounter for general adult medical examination without abnormal findings: Secondary | ICD-10-CM

## 2016-07-06 DIAGNOSIS — E784 Other hyperlipidemia: Secondary | ICD-10-CM | POA: Diagnosis not present

## 2016-07-06 DIAGNOSIS — Q210 Ventricular septal defect: Secondary | ICD-10-CM | POA: Diagnosis not present

## 2016-07-06 DIAGNOSIS — I428 Other cardiomyopathies: Secondary | ICD-10-CM | POA: Diagnosis not present

## 2016-07-06 DIAGNOSIS — I10 Essential (primary) hypertension: Secondary | ICD-10-CM

## 2016-07-06 DIAGNOSIS — E7849 Other hyperlipidemia: Secondary | ICD-10-CM

## 2016-07-06 DIAGNOSIS — F411 Generalized anxiety disorder: Secondary | ICD-10-CM | POA: Diagnosis not present

## 2016-07-06 DIAGNOSIS — I429 Cardiomyopathy, unspecified: Secondary | ICD-10-CM

## 2016-07-06 DIAGNOSIS — Z7901 Long term (current) use of anticoagulants: Secondary | ICD-10-CM

## 2016-07-06 DIAGNOSIS — E8881 Metabolic syndrome: Secondary | ICD-10-CM | POA: Diagnosis not present

## 2016-07-06 DIAGNOSIS — R7302 Impaired glucose tolerance (oral): Secondary | ICD-10-CM | POA: Diagnosis not present

## 2016-07-06 NOTE — Progress Notes (Signed)
Subjective:    Patient ID: Casey [name], female    DOB: Jun 23, 1934, 82 y.o.   MRN: [id number]  HPI  82 year old Black Female for health maintenance exam and evaluation of medical issues.  Husband not well. Has COPD and incontinence.He refuses to go to Dr. Have suggested she get her daughter to help intervene with his care. Patient is worn-out trying to clean up house after husband.  Patient was not able to afford Zetia for hyperlipidemia. Amiodarone interferes with statins.   Significant situational stress with husband.  She has a history of nonischemic cardiomyopathy with low ejection fraction, implantable defibrillator, hyperlipidemia, hypertension, controlled type 2 diabetes mellitus, chronic Coumadin therapy, glaucoma.  Past medical history: Hysterectomy with BSO February 1990. She took estrogen replacement for a number of years. History of peptic ulcer disease when she was 82 years old. She has had 2 C- sections. History of spinal stenosis treated conservatively by Dr. Carloyn Manner many years ago but was hospitalized due to severe pain at that time.  Right cataract extraction July 2009. History of ganglion cyst 2 cm in diameter aspirated from right leg by orthopedist.  Insertion of implantable defibrillator 2005. Had new defibrillator placed July 2009.  In December 2010, I incised and drained an infected epidermoid cyst.  Last colonoscopy on file on 1999.  She receives annual flu vaccine.  Had tetanus vaccine March 2011. Has had Prevnar 13 and pneumococcal 23 vaccines.  She took Lexapro for a while during the time her mother was ill but has been able to stop that medication.  She has regular eye exams due to history of glaucoma.  Social history: She is married. She is a Agricultural engineer. Completed one year college. Husband is a retired Art gallery manager who has severe COPD. They have a son and a daughter. Patient does not smoke or consume alcohol. Children live [place]of[place]town.  Family history:  Father died at age 91 with history of cancer of the esophagus. Mother with history of diabetes mellitus, CVA, hypertension, MI, dementia. One brother died of unknown causes. Patient indicates it was a suspicious death but he had a history of heart problems. One sister living but she does not have much contact with that sister.      Review of Systems  Constitutional: Positive for fatigue.  Respiratory: Negative.   Cardiovascular: Negative.   Gastrointestinal: Negative.   Genitourinary: Negative.   Neurological: Negative.   Psychiatric/Behavioral:       Anxious over husband's health       Objective:   Physical Exam  Constitutional: She is oriented to person, place, and time. She appears well-developed and well-nourished. No distress.  HENT:  Head: Normocephalic and atraumatic.  Right Ear: External ear normal.  Left Ear: External ear normal.  Mouth/Throat: Oropharynx is clear and moist.  Eyes: Conjunctivae and EOM are normal. Pupils are equal, round, and reactive to light. Right eye exhibits no discharge. Left eye exhibits no discharge.  Neck: Neck supple. No JVD present. No thyromegaly present.  Cardiovascular: Normal rate, regular rhythm, normal heart sounds and intact distal pulses.   No murmur heard. 3/6 systolic ejection murmur which is holosystolic. Defibrillator in place.  Pulmonary/Chest: No respiratory distress. She has no wheezes. She has no rales.  Breasts normal female without masses  Abdominal: Soft. Bowel sounds are normal. She exhibits no distension and no mass. There is no tenderness. There is no rebound and no guarding.  Genitourinary:  Genitourinary Comments: Deferred due to age and hysterectomy with BSO  Musculoskeletal: She exhibits no edema.  Neurological: She is alert and oriented to person, place, and time. She has normal reflexes. No cranial nerve deficit. Coordination normal.  Skin: Skin is warm and dry. She is not diaphoretic.  Psychiatric: She has a  normal mood and affect. Her behavior is normal. Thought content normal.  Vitals reviewed.         Assessment & Plan:  Ischemic cardiomyopathy  Implantable defibrillator  Controlled type 2 diabetes mellitus-stable  Glaucoma followed by ophthalmologist with regular appointments  Hyperlipidemia-cannot take statin with amiodarone. Not able to afford Zetia. Watch diet.  History of vitamin D deficiency  Essential hypertension  Anxiety  History of lumbar spinal stenosis currently asymptomatic  History of VSD  History of nonischemic cardiomyopathy  History of implantable cardiac defibrillator  Chronic anticoagulation therapy  Metabolic syndrome  Situational stress with husband-is going to need help from children to manage TM  Plan: Continue same medications and return in 6 months.  Subjective:   Patient presents for Medicare Annual/Subsequent preventive examination.  Review Past Medical/Family/Social:See above   Risk Factors  Current exercise habits: Mostly sedentary except for light housework Dietary issues discussed: Low fat low car  Cardiac risk factors: Family history in mother, hyperlipidemia, diabetes  Depression Screen  (Note: if answer to either of the following is "Yes", a more complete depression screening is indicated)   Over the past two weeks, have you felt down, depressed or hopeless? yes due to situation with husband Over the past two weeks, have you felt little interest or pleasure in doing things? Yes due to situation with husband Have you lost interest or pleasure in daily life? Somewhat Do you often feel hopeless? No Do you cry easily over simple problems? No   Activities of Daily Living  In your present state of health, do you have any difficulty performing the following activities?:   Driving? No  Managing money? No  Feeding yourself? No  Getting from bed to chair? No  Climbing a flight of stairs? No  Preparing food and eating?: No    Bathing or showering? No  Getting dressed: No  Getting to the toilet? No  Using the toilet:No  Moving around from place to place: No  In the past year have you fallen or had a near fall?:No  Are you sexually active? No  Do you have more than one partner? No   Hearing Difficulties: No  Do you often ask people to speak up or repeat themselves? No  Do you experience ringing or noises in your ears? Sometimes Do you have difficulty understanding soft or whispered voices? Sometimes Do you feel that you have a problem with memory? No Do you often misplace items? No    Home Safety:  Do you have a smoke alarm at your residence? Yes Do you have grab bars in the bathroom? No Do you have throw rugs in your house? No   Cognitive Testing  Alert? Yes Normal Appearance?Yes  Oriented to person? Yes Place? Yes  Time? Yes  Recall of three objects? Yes  Can perform simple calculations? Yes  Displays appropriate judgment?Yes  Can read the correct time from a watch face?Yes   List the Names of Other Physician/Practitioners you currently use:  See referral list for the physicians patient is currently seeing.     Review of Systems: See above   Objective:     General appearance: Appears stated age  Head: Normocephalic, without obvious abnormality, atraumatic  Eyes: conj clear,  EOMi PEERLA  Ears: normal TM's and external ear canals both ears  Nose: Nares normal. Septum midline. Mucosa normal. No drainage or sinus tenderness.  Throat: lips, mucosa, and tongue normal; teeth and gums normal  Neck: no adenopathy, no carotid bruit, no JVD, supple, symmetrical, trachea midline and thyroid not enlarged, symmetric, no tenderness/mass/nodules  No CVA tenderness.  Lungs: clear to auscultation bilaterally  Breasts: normal appearance, no masses or tenderness, implantable defibrillator in place Heart: regular rate and rhythm, S1, S2 . 3/6 systolic ejection murmur which is holosystolic and  unchanged Abdomen: soft, non-tender; bowel sounds normal; no masses, no organomegaly  Musculoskeletal: ROM normal in all joints, no crepitus, no deformity, Normal muscle strengthen. Back  is symmetric, no curvature. Skin: Skin color, texture, turgor normal. No rashes or lesions  Lymph nodes: Cervical, supraclavicular, and axillary nodes normal.  Neurologic: CN 2 -12 Normal, Normal symmetric reflexes. Normal coordination and gait  Psych: Alert & Oriented x 3, Mood appear stable.    Assessment:    Annual wellness medicare exam   Plan:    During the course of the visit the patient was educated and counseled about appropriate screening and preventive services including:   Annual mammogram  Annual flu vaccine     Patient Instructions (the written plan) was given to the patient.  Medicare Attestation  I have personally reviewed:  The patient's medical and social history  Their use of alcohol, tobacco or illicit drugs  Their current medications and supplements  The patient's functional ability including ADLs,fall risks, home safety risks, cognitive, and hearing and visual impairment  Diet and physical activities  Evidence for depression or mood disorders  The patient's weight, height, BMI, and visual acuity have been recorded in the chart. I have made referrals, counseling, and provided education to the patient based on review of the above and I have provided the patient with a written personalized care plan for preventive services.

## 2016-07-28 ENCOUNTER — Ambulatory Visit (INDEPENDENT_AMBULATORY_CARE_PROVIDER_SITE_OTHER): Payer: Medicare Other | Admitting: Internal Medicine

## 2016-07-28 ENCOUNTER — Encounter: Payer: Self-pay | Admitting: Internal Medicine

## 2016-07-28 VITALS — BP 122/88 | HR 93 | Temp 97.4°F | Ht 62.0 in | Wt 171.0 lb

## 2016-07-28 DIAGNOSIS — R220 Localized swelling, mass and lump, head: Secondary | ICD-10-CM

## 2016-07-28 MED ORDER — DOXYCYCLINE HYCLATE 100 MG PO TABS: 100.0000 mg | ORAL_TABLET | Freq: Two times a day (BID) | ORAL | 0 refills | Status: DC

## 2016-07-31 ENCOUNTER — Ambulatory Visit: Payer: Self-pay | Admitting: Internal Medicine

## 2016-08-01 ENCOUNTER — Ambulatory Visit (INDEPENDENT_AMBULATORY_CARE_PROVIDER_SITE_OTHER): Payer: Medicare Other | Admitting: Internal Medicine

## 2016-08-01 ENCOUNTER — Encounter: Payer: Self-pay | Admitting: Internal Medicine

## 2016-08-01 VITALS — BP 120/80 | HR 80 | Temp 97.1°F | Wt 171.8 lb

## 2016-08-01 DIAGNOSIS — I889 Nonspecific lymphadenitis, unspecified: Secondary | ICD-10-CM

## 2016-08-01 NOTE — Progress Notes (Signed)
   Subjective:    Patient ID: Casey [name], female    DOB: 09/19/1934, 82 y.o.   MRN: [id number]  HPI Patient was seen here February 26 with swollen left submandibular lymph node. She was not complaining of any dental pain.Marland Kitchen She was placed on doxycycline 100 mg twice daily for 10 days and advise follow-up today. The area has decreased in size considerably. It was nontender then and is nontender now. No fever or chills. No sore throat or respiratory infection symptoms.  History of some dental issues requiring dental extractions a year or so ago.    Review of Systems see above     Objective:   Physical Exam She has a molar left mandibular area that is possibly decayed. It is not tender to palpation. The left submandibular node has decreased in size by 50% but is still present and nontender       Assessment & Plan:  Left submandibular adenopathy  Possible dental caries  Situational stress with husband-he has COPD urinary incontinence and dementia. Family is coming to help her this weekend  Plan: Suggest patient see dentist in the near future and finish course of doxycycline. Lymph node should decrease back to normal size within 4 weeks.

## 2016-08-01 NOTE — Patient Instructions (Signed)
Finish course of doxycycline and see dentist in the near future. If no does not completely disappear in the next 4 weeks please call back.

## 2016-08-02 DIAGNOSIS — I429 Cardiomyopathy, unspecified: Secondary | ICD-10-CM | POA: Diagnosis not present

## 2016-08-02 DIAGNOSIS — I5022 Chronic systolic (congestive) heart failure: Secondary | ICD-10-CM | POA: Diagnosis not present

## 2016-08-02 DIAGNOSIS — Z7901 Long term (current) use of anticoagulants: Secondary | ICD-10-CM | POA: Diagnosis not present

## 2016-08-02 DIAGNOSIS — Q210 Ventricular septal defect: Secondary | ICD-10-CM | POA: Diagnosis not present

## 2016-08-02 DIAGNOSIS — E668 Other obesity: Secondary | ICD-10-CM | POA: Diagnosis not present

## 2016-08-02 DIAGNOSIS — Z95810 Presence of automatic (implantable) cardiac defibrillator: Secondary | ICD-10-CM | POA: Diagnosis not present

## 2016-08-02 DIAGNOSIS — N183 Chronic kidney disease, stage 3 (moderate): Secondary | ICD-10-CM | POA: Diagnosis not present

## 2016-08-02 DIAGNOSIS — I472 Ventricular tachycardia: Secondary | ICD-10-CM | POA: Diagnosis not present

## 2016-08-02 DIAGNOSIS — I428 Other cardiomyopathies: Secondary | ICD-10-CM | POA: Diagnosis not present

## 2016-08-02 DIAGNOSIS — E039 Hypothyroidism, unspecified: Secondary | ICD-10-CM | POA: Diagnosis not present

## 2016-08-06 NOTE — Progress Notes (Signed)
   Subjective:    Patient ID: Casey [name], female    DOB: 07-Jul-1934, 82 y.o.   MRN: [id number]  HPI Has noticed a lump under her left chin area that appeared abruptly. This surprised her. It's somewhat tender to touch. Says about a year ago she had some dental extractions done. Doesn't have any dental pain at this point in time. No fever chills or flulike symptoms. No weight loss or night sweats. No itching. No cough or sore throat    Review of Systems see above     Objective:   Physical Exam  Approximate 2 cm left submandibular mass/noted not rock hard but slightly spongy. It is not draining. Dentition is poor. Pharynx is clear. TMs are clear. Neck is supple. Chest clear.      Assessment & Plan:  Left submandibular adenopathy  Plan: Doxycycline 100 mg twice daily for 10 days. Follow-up this coming Tuesday with recheck. Call if symptoms worsen in the meantime.

## 2016-08-07 NOTE — Patient Instructions (Addendum)
Doxycycline 100 mg twice daily for 10 days. Return next week for follow-up.

## 2016-08-08 NOTE — Patient Instructions (Signed)
It was a pleasure to see you today. Continue same medications and return in 6 months. Try to get your children to help you with her husband's care.

## 2016-09-01 DIAGNOSIS — Z95810 Presence of automatic (implantable) cardiac defibrillator: Secondary | ICD-10-CM | POA: Diagnosis not present

## 2016-09-01 DIAGNOSIS — Q210 Ventricular septal defect: Secondary | ICD-10-CM | POA: Diagnosis not present

## 2016-09-01 DIAGNOSIS — E668 Other obesity: Secondary | ICD-10-CM | POA: Diagnosis not present

## 2016-09-01 DIAGNOSIS — I429 Cardiomyopathy, unspecified: Secondary | ICD-10-CM | POA: Diagnosis not present

## 2016-09-01 DIAGNOSIS — Z7901 Long term (current) use of anticoagulants: Secondary | ICD-10-CM | POA: Diagnosis not present

## 2016-09-01 DIAGNOSIS — E039 Hypothyroidism, unspecified: Secondary | ICD-10-CM | POA: Diagnosis not present

## 2016-09-01 DIAGNOSIS — N183 Chronic kidney disease, stage 3 (moderate): Secondary | ICD-10-CM | POA: Diagnosis not present

## 2016-09-01 DIAGNOSIS — I5022 Chronic systolic (congestive) heart failure: Secondary | ICD-10-CM | POA: Diagnosis not present

## 2016-09-01 DIAGNOSIS — I472 Ventricular tachycardia: Secondary | ICD-10-CM | POA: Diagnosis not present

## 2016-09-01 DIAGNOSIS — I428 Other cardiomyopathies: Secondary | ICD-10-CM | POA: Diagnosis not present

## 2016-09-04 ENCOUNTER — Other Ambulatory Visit: Payer: Self-pay | Admitting: Internal Medicine

## 2016-09-04 DIAGNOSIS — Z1231 Encounter for screening mammogram for malignant neoplasm of breast: Secondary | ICD-10-CM

## 2016-09-13 DIAGNOSIS — H02109 Unspecified ectropion of unspecified eye, unspecified eyelid: Secondary | ICD-10-CM | POA: Diagnosis not present

## 2016-09-13 DIAGNOSIS — H401121 Primary open-angle glaucoma, left eye, mild stage: Secondary | ICD-10-CM | POA: Diagnosis not present

## 2016-09-13 DIAGNOSIS — H401112 Primary open-angle glaucoma, right eye, moderate stage: Secondary | ICD-10-CM | POA: Diagnosis not present

## 2016-09-29 DIAGNOSIS — N183 Chronic kidney disease, stage 3 (moderate): Secondary | ICD-10-CM | POA: Diagnosis not present

## 2016-09-29 DIAGNOSIS — Z95810 Presence of automatic (implantable) cardiac defibrillator: Secondary | ICD-10-CM | POA: Diagnosis not present

## 2016-09-29 DIAGNOSIS — Z7901 Long term (current) use of anticoagulants: Secondary | ICD-10-CM | POA: Diagnosis not present

## 2016-09-29 DIAGNOSIS — Q210 Ventricular septal defect: Secondary | ICD-10-CM | POA: Diagnosis not present

## 2016-09-29 DIAGNOSIS — I428 Other cardiomyopathies: Secondary | ICD-10-CM | POA: Diagnosis not present

## 2016-09-29 DIAGNOSIS — E039 Hypothyroidism, unspecified: Secondary | ICD-10-CM | POA: Diagnosis not present

## 2016-09-29 DIAGNOSIS — I472 Ventricular tachycardia: Secondary | ICD-10-CM | POA: Diagnosis not present

## 2016-09-29 DIAGNOSIS — E668 Other obesity: Secondary | ICD-10-CM | POA: Diagnosis not present

## 2016-09-29 DIAGNOSIS — I429 Cardiomyopathy, unspecified: Secondary | ICD-10-CM | POA: Diagnosis not present

## 2016-09-29 DIAGNOSIS — I5022 Chronic systolic (congestive) heart failure: Secondary | ICD-10-CM | POA: Diagnosis not present

## 2016-10-02 ENCOUNTER — Ambulatory Visit
Admission: RE | Admit: 2016-10-02 | Discharge: 2016-10-02 | Disposition: A | Discharge status: Home / Self Care | Payer: Medicare Other | Source: Ambulatory Visit | Attending: Internal Medicine | Admitting: Internal Medicine

## 2016-10-02 DIAGNOSIS — I428 Other cardiomyopathies: Secondary | ICD-10-CM | POA: Diagnosis not present

## 2016-10-02 DIAGNOSIS — Z1231 Encounter for screening mammogram for malignant neoplasm of breast: Secondary | ICD-10-CM

## 2016-10-02 DIAGNOSIS — I429 Cardiomyopathy, unspecified: Secondary | ICD-10-CM | POA: Diagnosis not present

## 2016-10-02 DIAGNOSIS — I472 Ventricular tachycardia: Secondary | ICD-10-CM | POA: Diagnosis not present

## 2016-10-02 DIAGNOSIS — Z95810 Presence of automatic (implantable) cardiac defibrillator: Secondary | ICD-10-CM | POA: Diagnosis not present

## 2016-10-02 DIAGNOSIS — I5022 Chronic systolic (congestive) heart failure: Secondary | ICD-10-CM | POA: Diagnosis not present

## 2016-10-02 DIAGNOSIS — Z7901 Long term (current) use of anticoagulants: Secondary | ICD-10-CM | POA: Diagnosis not present

## 2016-10-02 DIAGNOSIS — E039 Hypothyroidism, unspecified: Secondary | ICD-10-CM | POA: Diagnosis not present

## 2016-10-02 DIAGNOSIS — E668 Other obesity: Secondary | ICD-10-CM | POA: Diagnosis not present

## 2016-10-02 DIAGNOSIS — Q210 Ventricular septal defect: Secondary | ICD-10-CM | POA: Diagnosis not present

## 2016-10-02 DIAGNOSIS — N183 Chronic kidney disease, stage 3 (moderate): Secondary | ICD-10-CM | POA: Diagnosis not present

## 2016-10-09 DIAGNOSIS — Q210 Ventricular septal defect: Secondary | ICD-10-CM | POA: Diagnosis not present

## 2016-10-09 DIAGNOSIS — N183 Chronic kidney disease, stage 3 (moderate): Secondary | ICD-10-CM | POA: Diagnosis not present

## 2016-10-09 DIAGNOSIS — E039 Hypothyroidism, unspecified: Secondary | ICD-10-CM | POA: Diagnosis not present

## 2016-10-09 DIAGNOSIS — I428 Other cardiomyopathies: Secondary | ICD-10-CM | POA: Diagnosis not present

## 2016-10-09 DIAGNOSIS — Z7901 Long term (current) use of anticoagulants: Secondary | ICD-10-CM | POA: Diagnosis not present

## 2016-10-09 DIAGNOSIS — E668 Other obesity: Secondary | ICD-10-CM | POA: Diagnosis not present

## 2016-10-09 DIAGNOSIS — I5022 Chronic systolic (congestive) heart failure: Secondary | ICD-10-CM | POA: Diagnosis not present

## 2016-10-09 DIAGNOSIS — I429 Cardiomyopathy, unspecified: Secondary | ICD-10-CM | POA: Diagnosis not present

## 2016-10-09 DIAGNOSIS — I472 Ventricular tachycardia: Secondary | ICD-10-CM | POA: Diagnosis not present

## 2016-10-09 DIAGNOSIS — Z95810 Presence of automatic (implantable) cardiac defibrillator: Secondary | ICD-10-CM | POA: Diagnosis not present

## 2016-10-10 DIAGNOSIS — R7989 Other specified abnormal findings of blood chemistry: Secondary | ICD-10-CM | POA: Diagnosis not present

## 2016-10-10 DIAGNOSIS — R809 Proteinuria, unspecified: Secondary | ICD-10-CM | POA: Diagnosis not present

## 2016-10-10 DIAGNOSIS — N183 Chronic kidney disease, stage 3 (moderate): Secondary | ICD-10-CM | POA: Diagnosis not present

## 2016-10-10 DIAGNOSIS — N2581 Secondary hyperparathyroidism of renal origin: Secondary | ICD-10-CM | POA: Diagnosis not present

## 2016-10-10 DIAGNOSIS — D631 Anemia in chronic kidney disease: Secondary | ICD-10-CM | POA: Diagnosis not present

## 2016-10-10 DIAGNOSIS — Z6831 Body mass index (BMI) 31.0-31.9, adult: Secondary | ICD-10-CM | POA: Diagnosis not present

## 2016-10-25 DIAGNOSIS — I472 Ventricular tachycardia: Secondary | ICD-10-CM | POA: Diagnosis not present

## 2016-10-25 DIAGNOSIS — I429 Cardiomyopathy, unspecified: Secondary | ICD-10-CM | POA: Diagnosis not present

## 2016-10-25 DIAGNOSIS — E039 Hypothyroidism, unspecified: Secondary | ICD-10-CM | POA: Diagnosis not present

## 2016-10-25 DIAGNOSIS — Z7901 Long term (current) use of anticoagulants: Secondary | ICD-10-CM | POA: Diagnosis not present

## 2016-10-25 DIAGNOSIS — N183 Chronic kidney disease, stage 3 (moderate): Secondary | ICD-10-CM | POA: Diagnosis not present

## 2016-10-25 DIAGNOSIS — I428 Other cardiomyopathies: Secondary | ICD-10-CM | POA: Diagnosis not present

## 2016-10-25 DIAGNOSIS — Q210 Ventricular septal defect: Secondary | ICD-10-CM | POA: Diagnosis not present

## 2016-10-25 DIAGNOSIS — I5022 Chronic systolic (congestive) heart failure: Secondary | ICD-10-CM | POA: Diagnosis not present

## 2016-10-25 DIAGNOSIS — E668 Other obesity: Secondary | ICD-10-CM | POA: Diagnosis not present

## 2016-10-25 DIAGNOSIS — Z95810 Presence of automatic (implantable) cardiac defibrillator: Secondary | ICD-10-CM | POA: Diagnosis not present

## 2016-11-08 DIAGNOSIS — I428 Other cardiomyopathies: Secondary | ICD-10-CM | POA: Diagnosis not present

## 2016-11-08 DIAGNOSIS — I5022 Chronic systolic (congestive) heart failure: Secondary | ICD-10-CM | POA: Diagnosis not present

## 2016-11-08 DIAGNOSIS — Z7901 Long term (current) use of anticoagulants: Secondary | ICD-10-CM | POA: Diagnosis not present

## 2016-11-08 DIAGNOSIS — E039 Hypothyroidism, unspecified: Secondary | ICD-10-CM | POA: Diagnosis not present

## 2016-11-08 DIAGNOSIS — Z95810 Presence of automatic (implantable) cardiac defibrillator: Secondary | ICD-10-CM | POA: Diagnosis not present

## 2016-11-08 DIAGNOSIS — E668 Other obesity: Secondary | ICD-10-CM | POA: Diagnosis not present

## 2016-11-08 DIAGNOSIS — I472 Ventricular tachycardia: Secondary | ICD-10-CM | POA: Diagnosis not present

## 2016-11-08 DIAGNOSIS — Q210 Ventricular septal defect: Secondary | ICD-10-CM | POA: Diagnosis not present

## 2016-11-08 DIAGNOSIS — I429 Cardiomyopathy, unspecified: Secondary | ICD-10-CM | POA: Diagnosis not present

## 2016-11-08 DIAGNOSIS — N183 Chronic kidney disease, stage 3 (moderate): Secondary | ICD-10-CM | POA: Diagnosis not present

## 2016-11-22 DIAGNOSIS — E039 Hypothyroidism, unspecified: Secondary | ICD-10-CM | POA: Diagnosis not present

## 2016-11-22 DIAGNOSIS — I428 Other cardiomyopathies: Secondary | ICD-10-CM | POA: Diagnosis not present

## 2016-11-22 DIAGNOSIS — Q210 Ventricular septal defect: Secondary | ICD-10-CM | POA: Diagnosis not present

## 2016-11-22 DIAGNOSIS — N183 Chronic kidney disease, stage 3 (moderate): Secondary | ICD-10-CM | POA: Diagnosis not present

## 2016-11-22 DIAGNOSIS — I429 Cardiomyopathy, unspecified: Secondary | ICD-10-CM | POA: Diagnosis not present

## 2016-11-22 DIAGNOSIS — Z7901 Long term (current) use of anticoagulants: Secondary | ICD-10-CM | POA: Diagnosis not present

## 2016-11-22 DIAGNOSIS — E668 Other obesity: Secondary | ICD-10-CM | POA: Diagnosis not present

## 2016-11-22 DIAGNOSIS — Z95810 Presence of automatic (implantable) cardiac defibrillator: Secondary | ICD-10-CM | POA: Diagnosis not present

## 2016-11-22 DIAGNOSIS — I5022 Chronic systolic (congestive) heart failure: Secondary | ICD-10-CM | POA: Diagnosis not present

## 2016-11-22 DIAGNOSIS — I472 Ventricular tachycardia: Secondary | ICD-10-CM | POA: Diagnosis not present

## 2016-12-06 DIAGNOSIS — I429 Cardiomyopathy, unspecified: Secondary | ICD-10-CM | POA: Diagnosis not present

## 2016-12-06 DIAGNOSIS — I472 Ventricular tachycardia: Secondary | ICD-10-CM | POA: Diagnosis not present

## 2016-12-06 DIAGNOSIS — E668 Other obesity: Secondary | ICD-10-CM | POA: Diagnosis not present

## 2016-12-06 DIAGNOSIS — Q210 Ventricular septal defect: Secondary | ICD-10-CM | POA: Diagnosis not present

## 2016-12-06 DIAGNOSIS — Z7901 Long term (current) use of anticoagulants: Secondary | ICD-10-CM | POA: Diagnosis not present

## 2016-12-06 DIAGNOSIS — Z95810 Presence of automatic (implantable) cardiac defibrillator: Secondary | ICD-10-CM | POA: Diagnosis not present

## 2016-12-06 DIAGNOSIS — I5022 Chronic systolic (congestive) heart failure: Secondary | ICD-10-CM | POA: Diagnosis not present

## 2016-12-06 DIAGNOSIS — I428 Other cardiomyopathies: Secondary | ICD-10-CM | POA: Diagnosis not present

## 2016-12-06 DIAGNOSIS — E039 Hypothyroidism, unspecified: Secondary | ICD-10-CM | POA: Diagnosis not present

## 2016-12-06 DIAGNOSIS — N183 Chronic kidney disease, stage 3 (moderate): Secondary | ICD-10-CM | POA: Diagnosis not present

## 2016-12-08 ENCOUNTER — Ambulatory Visit (INDEPENDENT_AMBULATORY_CARE_PROVIDER_SITE_OTHER): Payer: Medicare Other | Admitting: Internal Medicine

## 2016-12-08 ENCOUNTER — Encounter: Payer: Self-pay | Admitting: Internal Medicine

## 2016-12-08 VITALS — BP 120/86 | HR 64 | Temp 97.1°F | Resp 24 | Wt 168.0 lb

## 2016-12-08 DIAGNOSIS — M545 Low back pain, unspecified: Secondary | ICD-10-CM

## 2016-12-08 DIAGNOSIS — S39011A Strain of muscle, fascia and tendon of abdomen, initial encounter: Secondary | ICD-10-CM | POA: Diagnosis not present

## 2016-12-08 DIAGNOSIS — Z8739 Personal history of other diseases of the musculoskeletal system and connective tissue: Secondary | ICD-10-CM

## 2016-12-08 MED ORDER — PREDNISONE 10 MG PO TABS: ORAL_TABLET | ORAL | 0 refills | Status: DC

## 2016-12-08 MED ORDER — CYCLOBENZAPRINE HCL 10 MG PO TABS: ORAL_TABLET | ORAL | 0 refills | Status: DC

## 2016-12-08 NOTE — Patient Instructions (Signed)
Flexeril 10 mg one half tablet at bedtime. Prednisone 10 mg (#42 tablets) to take in tapering course as directed 6-6-5-5-4-4-3-3-2-2-1-1 taper.

## 2016-12-08 NOTE — Progress Notes (Signed)
   Subjective:    Patient ID: Casey [name], female    DOB: [birth year], 82 y.o.   MRN: [id number]  HPI move about the house. He has multiple medical issues and had a stroke recently. He's now a nursing facility. Somehow she strained her back helping him move around. She was pulling to help him out of a chair.  History of low back pain years ago seen by Dr. Carloyn Manner requiring hospitalization for pain control. History of spinal stenosis but has not been bothered by that in a number of years. Complaining of left lower back pain radiating into her buttock and also into left lower quadrant which may be more musculoskeletal pain    Review of Systems     Objective:   Physical Exam Straight leg raising is negative at 90 bilaterally and her muscle strength is good. Deep tendon reflexes 2+ and symmetrical in the knees and absent in the ankles. She is tender in her left lower quadrant without rebound tenderness. I think this is musculoskeletal in nature. She is moving slowly.       Assessment & Plan:  Left back strain  Left abdominal strain  Plan: Prednisone 10 mg  (#42) to take in tapering course as directed decreasing by one  10 mg tablet every 2 days for a total of a 12 day course of steroids. Flexeril 10 mg to take one half tablet at bedtime. She may need to go to physical therapy if not improving.

## 2016-12-20 DIAGNOSIS — Z95810 Presence of automatic (implantable) cardiac defibrillator: Secondary | ICD-10-CM | POA: Diagnosis not present

## 2016-12-20 DIAGNOSIS — Z7901 Long term (current) use of anticoagulants: Secondary | ICD-10-CM | POA: Diagnosis not present

## 2016-12-20 DIAGNOSIS — I472 Ventricular tachycardia: Secondary | ICD-10-CM | POA: Diagnosis not present

## 2016-12-20 DIAGNOSIS — I5022 Chronic systolic (congestive) heart failure: Secondary | ICD-10-CM | POA: Diagnosis not present

## 2016-12-20 DIAGNOSIS — E668 Other obesity: Secondary | ICD-10-CM | POA: Diagnosis not present

## 2016-12-20 DIAGNOSIS — I429 Cardiomyopathy, unspecified: Secondary | ICD-10-CM | POA: Diagnosis not present

## 2016-12-20 DIAGNOSIS — E039 Hypothyroidism, unspecified: Secondary | ICD-10-CM | POA: Diagnosis not present

## 2016-12-20 DIAGNOSIS — N183 Chronic kidney disease, stage 3 (moderate): Secondary | ICD-10-CM | POA: Diagnosis not present

## 2016-12-20 DIAGNOSIS — Q210 Ventricular septal defect: Secondary | ICD-10-CM | POA: Diagnosis not present

## 2016-12-20 DIAGNOSIS — I428 Other cardiomyopathies: Secondary | ICD-10-CM | POA: Diagnosis not present

## 2017-01-02 DIAGNOSIS — Z7901 Long term (current) use of anticoagulants: Secondary | ICD-10-CM | POA: Diagnosis not present

## 2017-01-02 DIAGNOSIS — Q210 Ventricular septal defect: Secondary | ICD-10-CM | POA: Diagnosis not present

## 2017-01-02 DIAGNOSIS — I472 Ventricular tachycardia: Secondary | ICD-10-CM | POA: Diagnosis not present

## 2017-01-02 DIAGNOSIS — E039 Hypothyroidism, unspecified: Secondary | ICD-10-CM | POA: Diagnosis not present

## 2017-01-02 DIAGNOSIS — I429 Cardiomyopathy, unspecified: Secondary | ICD-10-CM | POA: Diagnosis not present

## 2017-01-02 DIAGNOSIS — I428 Other cardiomyopathies: Secondary | ICD-10-CM | POA: Diagnosis not present

## 2017-01-02 DIAGNOSIS — Z95810 Presence of automatic (implantable) cardiac defibrillator: Secondary | ICD-10-CM | POA: Diagnosis not present

## 2017-01-02 DIAGNOSIS — E668 Other obesity: Secondary | ICD-10-CM | POA: Diagnosis not present

## 2017-01-02 DIAGNOSIS — N183 Chronic kidney disease, stage 3 (moderate): Secondary | ICD-10-CM | POA: Diagnosis not present

## 2017-01-02 DIAGNOSIS — I5022 Chronic systolic (congestive) heart failure: Secondary | ICD-10-CM | POA: Diagnosis not present

## 2017-01-09 ENCOUNTER — Other Ambulatory Visit: Payer: Medicare Other | Admitting: Internal Medicine

## 2017-01-09 ENCOUNTER — Telehealth: Payer: Self-pay | Admitting: Internal Medicine

## 2017-01-09 ENCOUNTER — Encounter: Payer: Self-pay | Admitting: Internal Medicine

## 2017-01-09 NOTE — Telephone Encounter (Signed)
An appointment this morning for fasting lab work. Says that Flexeril has made her wobbly and that has been unsteady on her feet. Not sure she could drive.  Reminded her that she had missed her fasting lab appointment today and has an upcoming appointment on August 2.  She agrees to try to, August 2 at 10:45 AM fasting. If she cannot, she will call in advance and cancel. She is to stop Flexeril.

## 2017-01-10 ENCOUNTER — Other Ambulatory Visit: Payer: Medicare Other | Admitting: Internal Medicine

## 2017-01-11 ENCOUNTER — Ambulatory Visit: Payer: Medicare Other | Admitting: Internal Medicine

## 2017-01-26 ENCOUNTER — Encounter: Payer: Self-pay | Admitting: Internal Medicine

## 2017-01-26 ENCOUNTER — Ambulatory Visit (INDEPENDENT_AMBULATORY_CARE_PROVIDER_SITE_OTHER): Payer: Medicare Other | Admitting: Internal Medicine

## 2017-01-26 VITALS — HR 70 | Temp 97.5°F | Wt 158.0 lb

## 2017-01-26 DIAGNOSIS — H8110 Benign paroxysmal vertigo, unspecified ear: Secondary | ICD-10-CM

## 2017-01-26 DIAGNOSIS — Z7901 Long term (current) use of anticoagulants: Secondary | ICD-10-CM

## 2017-01-26 DIAGNOSIS — I429 Cardiomyopathy, unspecified: Secondary | ICD-10-CM

## 2017-01-26 DIAGNOSIS — I428 Other cardiomyopathies: Secondary | ICD-10-CM

## 2017-01-26 DIAGNOSIS — I10 Essential (primary) hypertension: Secondary | ICD-10-CM

## 2017-01-26 DIAGNOSIS — E785 Hyperlipidemia, unspecified: Secondary | ICD-10-CM

## 2017-01-26 DIAGNOSIS — E039 Hypothyroidism, unspecified: Secondary | ICD-10-CM | POA: Diagnosis not present

## 2017-01-26 MED ORDER — MECLIZINE HCL 25 MG PO TABS: 25.0000 mg | ORAL_TABLET | Freq: Every day | ORAL | 0 refills | Status: DC

## 2017-01-26 NOTE — Progress Notes (Signed)
   Subjective:    Patient ID: Casey [name], female    DOB: March 09, 1935, 82 y.o.   MRN: [id number]  HPI 82 year old Female complaining of lightheadedness. Has had some room spinning dizziness. Last week had a fall  and bumped her knee. Had vertigo with that episode. Week before last, went to turn on air conditioner and had room spinning dizziness  and fell. Had been taking Flexeril for back pain at night but stopped after she called here to complain of legs giving away. Have had a hard time sorting these symptoms out this morning. She is very hard of hearing. Husband is in nursing home.  She is accompanied by her neighbor today who is very concerned about her. Says daughter and son do not visit very much. Patient seems lonely since husband has been placed in nursing home. However he was too much to care for at home. He had severe COPD and was incontinent. He's now in [facility]. Daughter lives in the [place] area apparently. We will try to call her.    Review of Systems     Objective:   Physical Exam She has rightward nystagmus. PERRLA. Moves all 4 extremities and muscle strength is 4/5 in all groups tested. She has a 10 mm drop in blood pressure from lying to sitting but is stable with standing. However it is low at 765 systolically.       Assessment & Plan:  Hyperlipidemia-continue diet. Not able to exercise much. Intolerant of statin medication.  TSH was checked in January. For some reason orders are pended for July 25 and August 2.  Elevated alkaline phosphatase-7 months ago it was 114. Need to follow-up on this.  Mild orthostasis-recommend that she drink plenty of fluids and stay well hydrated. Will need follow-up.  I believe she has benign positional vertigo today and have prescribed meclizine for her. She'll let me know if not better in 24-48 hours or sooner if worse.

## 2017-01-27 LAB — LIPID PANEL
CHOL/HDL RATIO: 6.7 ratio — AB (ref <5.0)
Cholesterol: 235 mg/dL — ABNORMAL HIGH (ref <200)
HDL: 35 mg/dL — AB (ref >50)
LDL Cholesterol: 164 mg/dL — ABNORMAL HIGH (ref <100)
TRIGLYCERIDES: 179 mg/dL — AB (ref <150)
VLDL: 36 mg/dL — ABNORMAL HIGH (ref <30)

## 2017-01-27 LAB — HEPATIC FUNCTION PANEL
ALBUMIN: 3.7 g/dL (ref 3.6–5.1)
ALK PHOS: 171 U/L — AB (ref 33–130)
ALT: 30 U/L — AB (ref 6–29)
AST: 21 U/L (ref 10–35)
BILIRUBIN TOTAL: 1.2 mg/dL (ref 0.2–1.2)
Bilirubin, Direct: 0.3 mg/dL — ABNORMAL HIGH (ref <=0.2)
Indirect Bilirubin: 0.9 mg/dL (ref 0.2–1.2)
TOTAL PROTEIN: 6.5 g/dL (ref 6.1–8.1)

## 2017-02-04 NOTE — Patient Instructions (Signed)
Take meclizine as directed. Stay well hydrated. Call if not better in 24-48 hours or sooner if worse.

## 2017-02-05 DIAGNOSIS — I5022 Chronic systolic (congestive) heart failure: Secondary | ICD-10-CM | POA: Diagnosis not present

## 2017-02-05 DIAGNOSIS — I428 Other cardiomyopathies: Secondary | ICD-10-CM | POA: Diagnosis not present

## 2017-02-05 DIAGNOSIS — Z7901 Long term (current) use of anticoagulants: Secondary | ICD-10-CM | POA: Diagnosis not present

## 2017-02-05 DIAGNOSIS — E668 Other obesity: Secondary | ICD-10-CM | POA: Diagnosis not present

## 2017-02-05 DIAGNOSIS — I429 Cardiomyopathy, unspecified: Secondary | ICD-10-CM | POA: Diagnosis not present

## 2017-02-05 DIAGNOSIS — E039 Hypothyroidism, unspecified: Secondary | ICD-10-CM | POA: Diagnosis not present

## 2017-02-05 DIAGNOSIS — Z95810 Presence of automatic (implantable) cardiac defibrillator: Secondary | ICD-10-CM | POA: Diagnosis not present

## 2017-02-05 DIAGNOSIS — I472 Ventricular tachycardia: Secondary | ICD-10-CM | POA: Diagnosis not present

## 2017-02-05 DIAGNOSIS — N183 Chronic kidney disease, stage 3 (moderate): Secondary | ICD-10-CM | POA: Diagnosis not present

## 2017-02-05 DIAGNOSIS — Q210 Ventricular septal defect: Secondary | ICD-10-CM | POA: Diagnosis not present

## 2017-02-07 ENCOUNTER — Encounter (HOSPITAL_COMMUNITY): Payer: Self-pay | Admitting: *Deleted

## 2017-02-07 ENCOUNTER — Emergency Department (HOSPITAL_COMMUNITY)
Admission: EM | Admit: 2017-02-07 | Discharge: 2017-02-07 | Disposition: A | Discharge status: Home / Self Care | Payer: Medicare Other | Attending: Emergency Medicine | Admitting: Emergency Medicine

## 2017-02-07 DIAGNOSIS — I5022 Chronic systolic (congestive) heart failure: Secondary | ICD-10-CM | POA: Diagnosis not present

## 2017-02-07 DIAGNOSIS — Z79899 Other long term (current) drug therapy: Secondary | ICD-10-CM | POA: Insufficient documentation

## 2017-02-07 DIAGNOSIS — I4891 Unspecified atrial fibrillation: Secondary | ICD-10-CM | POA: Insufficient documentation

## 2017-02-07 DIAGNOSIS — E039 Hypothyroidism, unspecified: Secondary | ICD-10-CM | POA: Diagnosis not present

## 2017-02-07 DIAGNOSIS — I130 Hypertensive heart and chronic kidney disease with heart failure and stage 1 through stage 4 chronic kidney disease, or unspecified chronic kidney disease: Secondary | ICD-10-CM | POA: Diagnosis not present

## 2017-02-07 DIAGNOSIS — Z7901 Long term (current) use of anticoagulants: Secondary | ICD-10-CM | POA: Insufficient documentation

## 2017-02-07 DIAGNOSIS — R9431 Abnormal electrocardiogram [ECG] [EKG]: Secondary | ICD-10-CM | POA: Diagnosis not present

## 2017-02-07 DIAGNOSIS — Z95810 Presence of automatic (implantable) cardiac defibrillator: Secondary | ICD-10-CM | POA: Insufficient documentation

## 2017-02-07 DIAGNOSIS — Q210 Ventricular septal defect: Secondary | ICD-10-CM | POA: Diagnosis not present

## 2017-02-07 DIAGNOSIS — R791 Abnormal coagulation profile: Secondary | ICD-10-CM | POA: Diagnosis not present

## 2017-02-07 DIAGNOSIS — R7989 Other specified abnormal findings of blood chemistry: Secondary | ICD-10-CM | POA: Diagnosis not present

## 2017-02-07 DIAGNOSIS — N179 Acute kidney failure, unspecified: Secondary | ICD-10-CM

## 2017-02-07 DIAGNOSIS — N183 Chronic kidney disease, stage 3 (moderate): Secondary | ICD-10-CM | POA: Diagnosis not present

## 2017-02-07 DIAGNOSIS — I429 Cardiomyopathy, unspecified: Secondary | ICD-10-CM | POA: Diagnosis not present

## 2017-02-07 DIAGNOSIS — I428 Other cardiomyopathies: Secondary | ICD-10-CM | POA: Insufficient documentation

## 2017-02-07 DIAGNOSIS — E668 Other obesity: Secondary | ICD-10-CM | POA: Diagnosis not present

## 2017-02-07 DIAGNOSIS — I472 Ventricular tachycardia: Secondary | ICD-10-CM | POA: Diagnosis not present

## 2017-02-07 LAB — CBC
HEMATOCRIT: 36.1 % (ref 36.0–46.0)
HEMOGLOBIN: 11.2 g/dL — AB (ref 12.0–15.0)
MCH: 32.5 pg (ref 26.0–34.0)
MCHC: 31 g/dL (ref 30.0–36.0)
MCV: 104.6 fL — ABNORMAL HIGH (ref 78.0–100.0)
Platelets: 126 10*3/uL — ABNORMAL LOW (ref 150–400)
RBC: 3.45 MIL/uL — AB (ref 3.87–5.11)
RDW: 14.8 % (ref 11.5–15.5)
WBC: 6.1 10*3/uL (ref 4.0–10.5)

## 2017-02-07 LAB — BASIC METABOLIC PANEL
Anion gap: 8 (ref 5–15)
BUN: 65 mg/dL — AB (ref 6–20)
CO2: 17 mmol/L — ABNORMAL LOW (ref 22–32)
Calcium: 9.3 mg/dL (ref 8.9–10.3)
Chloride: 118 mmol/L — ABNORMAL HIGH (ref 101–111)
Creatinine, Ser: 3.47 mg/dL — ABNORMAL HIGH (ref 0.44–1.00)
GFR calc Af Amer: 13 mL/min — ABNORMAL LOW (ref >60)
GFR, EST NON AFRICAN AMERICAN: 12 mL/min — AB (ref >60)
Glucose, Bld: 113 mg/dL — ABNORMAL HIGH (ref 65–99)
POTASSIUM: 4.9 mmol/L (ref 3.5–5.1)
SODIUM: 143 mmol/L (ref 135–145)

## 2017-02-07 LAB — PROTIME-INR
INR: 4.99 — AB
Prothrombin Time: 46 seconds — ABNORMAL HIGH (ref 11.4–15.2)

## 2017-02-07 LAB — POC OCCULT BLOOD, ED: Fecal Occult Bld: NEGATIVE

## 2017-02-07 NOTE — ED Provider Notes (Signed)
[facility] DEPT Provider Note   CSN: [id number] Arrival date & time: 02/07/17  1884     History   Chief Complaint Chief Complaint  Patient presents with  . Abnormal Lab    HPI Casey [name] is a 82 y.o. female.  HPI 82 year old female who presents with abnormal lab. She is unsure what the abnormal lab result was, but was told by Dr. Thurman Coyer office to come to the ED today for evaluation. She does have a history of nonischemic cardiomyopathy status post AICD, hypertension, paroxysmal atrial fibrillation on Coumadin. States that her Coumadin level was elevated about 1-2 weeks ago, and her Coumadin dose was decreased. She had plans to potentially get her Coumadin level rechecked again today. Denies any fevers, headaches, confusion, focal numbness or weakness, nausea or vomiting, chest pain or difficulty breathing, hematemesis, melena or hematochezia, or abdominal pain. Past Medical History:  Diagnosis Date  . Biventricular ICD (implantable cardiac defibrillator) in place    medtronic   . Chronic systolic heart failure ([place])   . Hypertension   . Nonischemic cardiomyopathy ([place])   . VT (ventricular tachycardia) ([place])    appropriate therapy via ICD 2013    Patient Active Problem List   Diagnosis Date Noted  . Impaired glucose tolerance 12/09/2015  . Bilateral hearing loss 12/09/2015  . Kidney disease, chronic, stage III (GFR 30-59 ml/min)   . Ventricular septal defect (VSD)   . Long-term (current) use of anticoagulants   . Hypothyroidism 03/11/2013  . Gout   . Vitamin D deficiency 03/06/2011  . Glaucoma   . Anxiety   . Spinal stenosis   . PVC's (premature ventricular contractions) 02/14/2011  . Atrial fibrillation ([place]) 02/14/2011  . Leg pain-intermittent exertionally related 02/14/2011  . Biventricular implantable cardioverter-defibrillator in situ   . Nonischemic cardiomyopathy ([place])   . Hyperlipidemia   . Ventricular tachycardia   . Hypertensive heart  disease   . Chronic systolic CHF (congestive heart failure) (HCC)     Past Surgical History:  Procedure Laterality Date  . BIV ICD GENERTAOR CHANGE OUT N/A 04/16/2013   Procedure: BIV ICD GENERTAOR CHANGE OUT;  Surgeon: Deboraha Sprang, MD;  Location: [facility] CATH LAB;  Service: Cardiovascular;  Laterality: N/A;    OB History    No data available       Home Medications    Prior to Admission medications   Medication Sig Start Date End Date Taking? Authorizing Provider  allopurinol (ZYLOPRIM) 100 MG tablet TAKE 1 TABLET BY MOUTH EVERY DAY 05/02/16  Yes Baxley, Cresenciano Lick, MD  amiodarone (PACERONE) 200 MG tablet Take 200 mg by mouth daily.   Yes [provider]  furosemide (LASIX) 40 MG tablet Take 40 mg by mouth 2 (two) times daily.    Yes [provider]  levothyroxine (SYNTHROID, LEVOTHROID) 50 MCG tablet Take 50 mcg by mouth daily before breakfast.   Yes [provider]  lisinopril (PRINIVIL,ZESTRIL) 20 MG tablet Take 20 mg by mouth 2 (two) times daily.    Yes [provider]  meclizine (ANTIVERT) 25 MG tablet Take 1 tablet (25 mg total) by mouth at bedtime. 01/26/17  Yes Baxley, Cresenciano Lick, MD  metoprolol (LOPRESSOR) 50 MG tablet Take 50 mg by mouth daily.    Yes [provider]  Multiple Vitamins-Calcium (ONE-A-DAY WOMENS FORMULA) TABS Take 1 tablet by mouth daily.     Yes [provider]  potassium chloride SA (K-DUR,KLOR-CON) 20 MEQ tablet Take 20 mEq by mouth daily.  Yes [provider]  vitamin D, CHOLECALCIFEROL, 400 UNITS tablet Take 800 Units by mouth daily.   Yes [provider]  warfarin (COUMADIN) 2.5 MG tablet Take 2.5 mg by mouth daily.    Yes [provider]    Family History Family History  Problem Relation Age of Onset  . Heart failure Mother   . Cancer Father     Social History Social History  Substance Use Topics  . Smoking status: Never Smoker  . Smokeless tobacco: Never Used  .  Alcohol use No     Allergies   Codeine   Review of Systems Review of Systems  Constitutional: Negative for fever.  Respiratory: Negative for shortness of breath.   Cardiovascular: Negative for chest pain.  Gastrointestinal: Negative for abdominal pain and blood in stool.  Neurological: Negative for headaches.  Hematological: Bruises/bleeds easily.  All other systems reviewed and are negative.    Physical Exam Updated Vital Signs BP (!) 118/58   Pulse 69   Temp 97.9 F (36.6 C) (Oral)   Resp 18   SpO2 98%   Physical Exam Physical Exam  Nursing note and vitals reviewed. Constitutional: Well developed, well nourished, non-toxic, and in no acute distress Head: Normocephalic and atraumatic.  Mouth/Throat: Oropharynx is clear and moist.  Neck: Normal range of motion. Neck supple.  Cardiovascular: Normal rate and regular rhythm.   Pulmonary/Chest: Effort normal and breath sounds normal.  Abdominal: Soft. There is no tenderness. There is no rebound and no guarding.  Musculoskeletal: Normal range of motion.  Neurological: Alert, no facial droop, fluent speech, moves all extremities symmetrically Skin: Skin is warm and dry.  Psychiatric: Cooperative   ED Treatments / Results  Labs (all labs ordered are listed, but only abnormal results are displayed) Labs Reviewed  CBC - Abnormal; Notable for the following:       Result Value   RBC 3.45 (*)    Hemoglobin 11.2 (*)    MCV 104.6 (*)    Platelets 126 (*)    All other components within normal limits  PROTIME-INR - Abnormal; Notable for the following:    Prothrombin Time 46.0 (*)    INR 4.99 (*)    All other components within normal limits  BASIC METABOLIC PANEL - Abnormal; Notable for the following:    Chloride 118 (*)    CO2 17 (*)    Glucose, Bld 113 (*)    BUN 65 (*)    Creatinine, Ser 3.47 (*)    GFR calc non Af Amer 12 (*)    GFR calc Af Amer 13 (*)    All other components within normal limits  POC OCCULT  BLOOD, ED    EKG  EKG Interpretation None       Radiology No results found.  Procedures Procedures (including critical care time)  Medications Ordered in ED Medications - No data to display   Initial Impression / Assessment and Plan / ED Course  I have reviewed the triage vital signs and the nursing notes.  Pertinent labs & imaging results that were available during my care of the patient were reviewed by me and considered in my medical decision making (see chart for details).     82 year old female who was sent in from Dr. Thurman Coyer office for INR check. I spoke with Dr. Wynonia Lawman on the phone, and she had to INR checks within the last week that was greater than 8, including today. On recheck of her INR today, it  was 4.99. There is no evidence of acute bleeding although she is slightly anemic at 11.4 compared to 13 about 6 months ago. She is guaiac negative stool. Dr. Wynonia Lawman recommended holding her Coumadin until he rechecks her INR next week. Patient will call the office to arrange appointment.  Also has notable worsening function on top of chronic kidney disease. Creatinine is 3.4, and her baseline 6-7 months ago was 1.5. She does not have hyperkalemia. No signs of upper GI bleed. She is not fluid overloaded. Given that she does have a single kidney, discussed with nephrology and spoke with Dr. Florene Glen. I he recommends that we hold lisinopril and Lasix until she talks with Dr. Justin Mend, who is her nephrologist. Discussed with patient that she will call Dr. Jason Nest office today and have them arrange him to call her within 24 hours to discuss medication changes, per Dr. Florene Glen.  Strict return and follow-up instructions reviewed. She expressed understanding of all discharge instructions and felt comfortable with the plan of care.   Final Clinical Impressions(s) / ED Diagnoses   Final diagnoses:  Supratherapeutic INR  Acute kidney injury [facility])    New Prescriptions New Prescriptions    No medications on file     Forde Dandy, MD 02/07/17 1205

## 2017-02-07 NOTE — Discharge Instructions (Signed)
Your INR is 4.99. Dr. Wynonia Lawman asks you to STOP/HOLD taking your coumadin until he sees you next week for a re-check. Please call Dr. Thurman Coyer office today to set up a follow-up appointment on Monday for re-check.  Your kidney function is also worse. Your creatine is 3.4. Please HOLD your Lisinopril (Zestril/Prinivil) and HOLD your furosemide (lasix). Please call Dr. Justin Mend today, and make sure the office has his call you back today or tomorrow. Dr. Justin Mend will then guide you about restarting those medications.   Please return without fail for worsening symptoms, including confusion, intractable vomiting, blood in your stools or black stools, or any other symptoms concerning to you.

## 2017-02-07 NOTE — ED Triage Notes (Signed)
Pt reports taking coumadin and sent here today due to abnormal result when getting her levels checked today at dr Ezekiel Slocumb office. No other complaints and no acute distress is noted at triage.

## 2017-02-08 ENCOUNTER — Telehealth: Payer: Self-pay | Admitting: Internal Medicine

## 2017-02-08 ENCOUNTER — Encounter: Payer: Self-pay | Admitting: Internal Medicine

## 2017-02-08 DIAGNOSIS — Z029 Encounter for administrative examinations, unspecified: Secondary | ICD-10-CM

## 2017-02-08 NOTE — Telephone Encounter (Signed)
Telephone call to daughter, Rollene Fare who lives in [place]. Best contact number  is [phone].  Explained to her that mother had been in emergency department yesterday with elevated INR yesterday which to my knowledge should never happened before. She says mother was confused and drowsy while on muscle relaxant and Antivert.  Explained to her that mother may need for help in the home. Explained her that she is hard of hearing and needs hearing evaluation.  She says mother has become increasingly anxious since father has been placed in nursing home. She is worried about money and whether or not Medicaid will come through for the nursing home payments.  She says neighbors take care of her mother. Explained to her that did not think that was a long-term option. I can send home health out to check on her but they are coarse will not stay with her for any. Of time.  She says her her brother lives here in town but seldom checks on his mother. He did take her to see her husband in nursing home yesterday.  She says she texts her mother frequently.  Explained to her that kidney functions were elevated in emergency department yesterday and I was concerned about volume depletion. She has a history of chronic kidney disease. Daughter is aware of that. Creatinine was elevated at 3.47 and previously had been 1.55. ProTime INR was 4.99. Stool was guaiac-negative. Coumadin is being held until INR is checked next week. Lisinopril and Lasix are being held.  Reason for: Daughter was to establish contact with her and let her know mother has not been doing as well in the past few weeks since husband went to nursing home. Encouraged her to keep frequent contact with her mother.

## 2017-02-09 ENCOUNTER — Telehealth: Payer: Self-pay

## 2017-02-09 NOTE — Telephone Encounter (Signed)
[facility] called the patient and she told them she does not need their help and hung up on them. She is aware we are who sent them her information but told Margreta Journey she did not need her help she is fine and hung up. They wanted to make [place] aware so that we can decide what steps to take now. Christina's call back number is [phone]. Please advise.

## 2017-02-09 NOTE — Telephone Encounter (Signed)
Cancel referral and call her duaghter. Her number is in one of my messages

## 2017-02-09 NOTE — Telephone Encounter (Signed)
Called and spoke with Casey [name] and she stated that she has spoken with her mother this morning and she feels that the referral was a huge overstep because her mother did not ask for this or need this. She said her mother is taking all of her medications and she only went the ER because Dr. Oran Rein told her to in order to get her blood work back faster. She also said that her mother is very upset with [place], and even though she appreciated the concern she does not need any help. Her daughter also said that she is not going to do anything she has to pay for and I told her that it all goes through insurance and she said "yeah no she does not need any of that." Stated she is driving now to see her dad at [facility] and if her mother is okay to do all that then she is fine. Casey [name] said she would call if she needs [place] otherwise she is fine and needs nothing.

## 2017-02-20 DIAGNOSIS — I5022 Chronic systolic (congestive) heart failure: Secondary | ICD-10-CM | POA: Diagnosis not present

## 2017-02-20 DIAGNOSIS — I428 Other cardiomyopathies: Secondary | ICD-10-CM | POA: Diagnosis not present

## 2017-02-20 DIAGNOSIS — E039 Hypothyroidism, unspecified: Secondary | ICD-10-CM | POA: Diagnosis not present

## 2017-02-20 DIAGNOSIS — N183 Chronic kidney disease, stage 3 (moderate): Secondary | ICD-10-CM | POA: Diagnosis not present

## 2017-02-20 DIAGNOSIS — Z7901 Long term (current) use of anticoagulants: Secondary | ICD-10-CM | POA: Diagnosis not present

## 2017-02-20 DIAGNOSIS — Z95810 Presence of automatic (implantable) cardiac defibrillator: Secondary | ICD-10-CM | POA: Diagnosis not present

## 2017-02-20 DIAGNOSIS — Q210 Ventricular septal defect: Secondary | ICD-10-CM | POA: Diagnosis not present

## 2017-02-20 DIAGNOSIS — I429 Cardiomyopathy, unspecified: Secondary | ICD-10-CM | POA: Diagnosis not present

## 2017-02-20 DIAGNOSIS — I472 Ventricular tachycardia: Secondary | ICD-10-CM | POA: Diagnosis not present

## 2017-02-20 DIAGNOSIS — E668 Other obesity: Secondary | ICD-10-CM | POA: Diagnosis not present

## 2017-02-26 DIAGNOSIS — N2581 Secondary hyperparathyroidism of renal origin: Secondary | ICD-10-CM | POA: Diagnosis not present

## 2017-02-26 DIAGNOSIS — R809 Proteinuria, unspecified: Secondary | ICD-10-CM | POA: Diagnosis not present

## 2017-02-26 DIAGNOSIS — D631 Anemia in chronic kidney disease: Secondary | ICD-10-CM | POA: Diagnosis not present

## 2017-02-26 DIAGNOSIS — R7989 Other specified abnormal findings of blood chemistry: Secondary | ICD-10-CM | POA: Diagnosis not present

## 2017-02-26 DIAGNOSIS — N183 Chronic kidney disease, stage 3 (moderate): Secondary | ICD-10-CM | POA: Diagnosis not present

## 2017-02-26 DIAGNOSIS — Z6831 Body mass index (BMI) 31.0-31.9, adult: Secondary | ICD-10-CM | POA: Diagnosis not present

## 2017-02-28 DIAGNOSIS — E039 Hypothyroidism, unspecified: Secondary | ICD-10-CM | POA: Diagnosis not present

## 2017-02-28 DIAGNOSIS — I472 Ventricular tachycardia: Secondary | ICD-10-CM | POA: Diagnosis not present

## 2017-02-28 DIAGNOSIS — Q210 Ventricular septal defect: Secondary | ICD-10-CM | POA: Diagnosis not present

## 2017-02-28 DIAGNOSIS — I5022 Chronic systolic (congestive) heart failure: Secondary | ICD-10-CM | POA: Diagnosis not present

## 2017-02-28 DIAGNOSIS — I428 Other cardiomyopathies: Secondary | ICD-10-CM | POA: Diagnosis not present

## 2017-02-28 DIAGNOSIS — E668 Other obesity: Secondary | ICD-10-CM | POA: Diagnosis not present

## 2017-02-28 DIAGNOSIS — I429 Cardiomyopathy, unspecified: Secondary | ICD-10-CM | POA: Diagnosis not present

## 2017-02-28 DIAGNOSIS — Z7901 Long term (current) use of anticoagulants: Secondary | ICD-10-CM | POA: Diagnosis not present

## 2017-02-28 DIAGNOSIS — N183 Chronic kidney disease, stage 3 (moderate): Secondary | ICD-10-CM | POA: Diagnosis not present

## 2017-02-28 DIAGNOSIS — Z95810 Presence of automatic (implantable) cardiac defibrillator: Secondary | ICD-10-CM | POA: Diagnosis not present

## 2017-03-06 DIAGNOSIS — N183 Chronic kidney disease, stage 3 (moderate): Secondary | ICD-10-CM | POA: Diagnosis not present

## 2017-03-06 DIAGNOSIS — R809 Proteinuria, unspecified: Secondary | ICD-10-CM | POA: Diagnosis not present

## 2017-03-06 DIAGNOSIS — D631 Anemia in chronic kidney disease: Secondary | ICD-10-CM | POA: Diagnosis not present

## 2017-03-06 DIAGNOSIS — N2581 Secondary hyperparathyroidism of renal origin: Secondary | ICD-10-CM | POA: Diagnosis not present

## 2017-03-06 DIAGNOSIS — Z6831 Body mass index (BMI) 31.0-31.9, adult: Secondary | ICD-10-CM | POA: Diagnosis not present

## 2017-03-06 DIAGNOSIS — R7989 Other specified abnormal findings of blood chemistry: Secondary | ICD-10-CM | POA: Diagnosis not present

## 2017-03-14 DIAGNOSIS — I472 Ventricular tachycardia: Secondary | ICD-10-CM | POA: Diagnosis not present

## 2017-03-14 DIAGNOSIS — I429 Cardiomyopathy, unspecified: Secondary | ICD-10-CM | POA: Diagnosis not present

## 2017-03-14 DIAGNOSIS — Z7901 Long term (current) use of anticoagulants: Secondary | ICD-10-CM | POA: Diagnosis not present

## 2017-03-14 DIAGNOSIS — N183 Chronic kidney disease, stage 3 (moderate): Secondary | ICD-10-CM | POA: Diagnosis not present

## 2017-03-14 DIAGNOSIS — E039 Hypothyroidism, unspecified: Secondary | ICD-10-CM | POA: Diagnosis not present

## 2017-03-14 DIAGNOSIS — Z95810 Presence of automatic (implantable) cardiac defibrillator: Secondary | ICD-10-CM | POA: Diagnosis not present

## 2017-03-14 DIAGNOSIS — Q210 Ventricular septal defect: Secondary | ICD-10-CM | POA: Diagnosis not present

## 2017-03-14 DIAGNOSIS — I5022 Chronic systolic (congestive) heart failure: Secondary | ICD-10-CM | POA: Diagnosis not present

## 2017-03-14 DIAGNOSIS — E668 Other obesity: Secondary | ICD-10-CM | POA: Diagnosis not present

## 2017-03-14 DIAGNOSIS — I428 Other cardiomyopathies: Secondary | ICD-10-CM | POA: Diagnosis not present

## 2017-03-19 ENCOUNTER — Ambulatory Visit: Payer: Medicare Other | Admitting: Internal Medicine

## 2017-03-26 DIAGNOSIS — Z23 Encounter for immunization: Secondary | ICD-10-CM | POA: Diagnosis not present

## 2017-04-03 DIAGNOSIS — Z95810 Presence of automatic (implantable) cardiac defibrillator: Secondary | ICD-10-CM | POA: Diagnosis not present

## 2017-04-03 DIAGNOSIS — I472 Ventricular tachycardia: Secondary | ICD-10-CM | POA: Diagnosis not present

## 2017-04-03 DIAGNOSIS — Z7901 Long term (current) use of anticoagulants: Secondary | ICD-10-CM | POA: Diagnosis not present

## 2017-04-03 DIAGNOSIS — I429 Cardiomyopathy, unspecified: Secondary | ICD-10-CM | POA: Diagnosis not present

## 2017-04-03 DIAGNOSIS — E039 Hypothyroidism, unspecified: Secondary | ICD-10-CM | POA: Diagnosis not present

## 2017-04-03 DIAGNOSIS — Q210 Ventricular septal defect: Secondary | ICD-10-CM | POA: Diagnosis not present

## 2017-04-03 DIAGNOSIS — I428 Other cardiomyopathies: Secondary | ICD-10-CM | POA: Diagnosis not present

## 2017-04-03 DIAGNOSIS — I5022 Chronic systolic (congestive) heart failure: Secondary | ICD-10-CM | POA: Diagnosis not present

## 2017-04-03 DIAGNOSIS — E668 Other obesity: Secondary | ICD-10-CM | POA: Diagnosis not present

## 2017-04-03 DIAGNOSIS — N183 Chronic kidney disease, stage 3 (moderate): Secondary | ICD-10-CM | POA: Diagnosis not present

## 2017-04-10 ENCOUNTER — Ambulatory Visit
Admission: RE | Admit: 2017-04-10 | Discharge: 2017-04-10 | Disposition: A | Discharge status: Home / Self Care | Payer: Medicare Other | Source: Ambulatory Visit | Attending: Cardiology | Admitting: Cardiology

## 2017-04-10 ENCOUNTER — Other Ambulatory Visit: Payer: Self-pay | Admitting: Cardiology

## 2017-04-10 DIAGNOSIS — N183 Chronic kidney disease, stage 3 (moderate): Secondary | ICD-10-CM | POA: Diagnosis not present

## 2017-04-10 DIAGNOSIS — R0602 Shortness of breath: Secondary | ICD-10-CM | POA: Diagnosis not present

## 2017-04-10 DIAGNOSIS — I5022 Chronic systolic (congestive) heart failure: Secondary | ICD-10-CM | POA: Diagnosis not present

## 2017-04-10 DIAGNOSIS — E039 Hypothyroidism, unspecified: Secondary | ICD-10-CM | POA: Diagnosis not present

## 2017-04-10 DIAGNOSIS — Z7901 Long term (current) use of anticoagulants: Secondary | ICD-10-CM | POA: Diagnosis not present

## 2017-04-10 DIAGNOSIS — I472 Ventricular tachycardia: Secondary | ICD-10-CM | POA: Diagnosis not present

## 2017-04-10 DIAGNOSIS — E668 Other obesity: Secondary | ICD-10-CM | POA: Diagnosis not present

## 2017-04-10 DIAGNOSIS — Q210 Ventricular septal defect: Secondary | ICD-10-CM | POA: Diagnosis not present

## 2017-04-10 DIAGNOSIS — Z95810 Presence of automatic (implantable) cardiac defibrillator: Secondary | ICD-10-CM | POA: Diagnosis not present

## 2017-04-10 DIAGNOSIS — I429 Cardiomyopathy, unspecified: Secondary | ICD-10-CM | POA: Diagnosis not present

## 2017-04-10 DIAGNOSIS — I428 Other cardiomyopathies: Secondary | ICD-10-CM | POA: Diagnosis not present

## 2017-04-18 DIAGNOSIS — E039 Hypothyroidism, unspecified: Secondary | ICD-10-CM | POA: Diagnosis not present

## 2017-04-18 DIAGNOSIS — N183 Chronic kidney disease, stage 3 (moderate): Secondary | ICD-10-CM | POA: Diagnosis not present

## 2017-04-18 DIAGNOSIS — Z7901 Long term (current) use of anticoagulants: Secondary | ICD-10-CM | POA: Diagnosis not present

## 2017-04-18 DIAGNOSIS — I429 Cardiomyopathy, unspecified: Secondary | ICD-10-CM | POA: Diagnosis not present

## 2017-04-18 DIAGNOSIS — R0602 Shortness of breath: Secondary | ICD-10-CM | POA: Diagnosis not present

## 2017-04-18 DIAGNOSIS — I700 Atherosclerosis of aorta: Secondary | ICD-10-CM | POA: Diagnosis not present

## 2017-04-18 DIAGNOSIS — I472 Ventricular tachycardia: Secondary | ICD-10-CM | POA: Diagnosis not present

## 2017-04-18 DIAGNOSIS — E668 Other obesity: Secondary | ICD-10-CM | POA: Diagnosis not present

## 2017-04-18 DIAGNOSIS — I5022 Chronic systolic (congestive) heart failure: Secondary | ICD-10-CM | POA: Diagnosis not present

## 2017-04-18 DIAGNOSIS — Q210 Ventricular septal defect: Secondary | ICD-10-CM | POA: Diagnosis not present

## 2017-04-18 DIAGNOSIS — I428 Other cardiomyopathies: Secondary | ICD-10-CM | POA: Diagnosis not present

## 2017-04-18 DIAGNOSIS — Z95810 Presence of automatic (implantable) cardiac defibrillator: Secondary | ICD-10-CM | POA: Diagnosis not present

## 2017-04-20 DIAGNOSIS — I5022 Chronic systolic (congestive) heart failure: Secondary | ICD-10-CM | POA: Diagnosis not present

## 2017-04-25 DIAGNOSIS — E668 Other obesity: Secondary | ICD-10-CM | POA: Diagnosis not present

## 2017-04-25 DIAGNOSIS — I429 Cardiomyopathy, unspecified: Secondary | ICD-10-CM | POA: Diagnosis not present

## 2017-04-25 DIAGNOSIS — I428 Other cardiomyopathies: Secondary | ICD-10-CM | POA: Diagnosis not present

## 2017-04-25 DIAGNOSIS — Z95810 Presence of automatic (implantable) cardiac defibrillator: Secondary | ICD-10-CM | POA: Diagnosis not present

## 2017-04-25 DIAGNOSIS — I5022 Chronic systolic (congestive) heart failure: Secondary | ICD-10-CM | POA: Diagnosis not present

## 2017-04-25 DIAGNOSIS — E039 Hypothyroidism, unspecified: Secondary | ICD-10-CM | POA: Diagnosis not present

## 2017-04-25 DIAGNOSIS — Z7901 Long term (current) use of anticoagulants: Secondary | ICD-10-CM | POA: Diagnosis not present

## 2017-04-25 DIAGNOSIS — N183 Chronic kidney disease, stage 3 (moderate): Secondary | ICD-10-CM | POA: Diagnosis not present

## 2017-04-25 DIAGNOSIS — R0602 Shortness of breath: Secondary | ICD-10-CM | POA: Diagnosis not present

## 2017-04-25 DIAGNOSIS — I700 Atherosclerosis of aorta: Secondary | ICD-10-CM | POA: Diagnosis not present

## 2017-04-25 DIAGNOSIS — I472 Ventricular tachycardia: Secondary | ICD-10-CM | POA: Diagnosis not present

## 2017-04-25 DIAGNOSIS — Q210 Ventricular septal defect: Secondary | ICD-10-CM | POA: Diagnosis not present

## 2017-04-30 DIAGNOSIS — I5022 Chronic systolic (congestive) heart failure: Secondary | ICD-10-CM | POA: Diagnosis not present

## 2017-05-07 DIAGNOSIS — Z7901 Long term (current) use of anticoagulants: Secondary | ICD-10-CM | POA: Diagnosis not present

## 2017-05-07 DIAGNOSIS — Z95810 Presence of automatic (implantable) cardiac defibrillator: Secondary | ICD-10-CM | POA: Diagnosis not present

## 2017-05-07 DIAGNOSIS — I700 Atherosclerosis of aorta: Secondary | ICD-10-CM | POA: Diagnosis not present

## 2017-05-07 DIAGNOSIS — I428 Other cardiomyopathies: Secondary | ICD-10-CM | POA: Diagnosis not present

## 2017-05-07 DIAGNOSIS — R0602 Shortness of breath: Secondary | ICD-10-CM | POA: Diagnosis not present

## 2017-05-07 DIAGNOSIS — N183 Chronic kidney disease, stage 3 (moderate): Secondary | ICD-10-CM | POA: Diagnosis not present

## 2017-05-07 DIAGNOSIS — E668 Other obesity: Secondary | ICD-10-CM | POA: Diagnosis not present

## 2017-05-07 DIAGNOSIS — I5022 Chronic systolic (congestive) heart failure: Secondary | ICD-10-CM | POA: Diagnosis not present

## 2017-05-07 DIAGNOSIS — E039 Hypothyroidism, unspecified: Secondary | ICD-10-CM | POA: Diagnosis not present

## 2017-05-07 DIAGNOSIS — Q210 Ventricular septal defect: Secondary | ICD-10-CM | POA: Diagnosis not present

## 2017-05-07 DIAGNOSIS — I472 Ventricular tachycardia: Secondary | ICD-10-CM | POA: Diagnosis not present

## 2017-05-07 DIAGNOSIS — I429 Cardiomyopathy, unspecified: Secondary | ICD-10-CM | POA: Diagnosis not present

## 2017-05-08 DIAGNOSIS — Z6831 Body mass index (BMI) 31.0-31.9, adult: Secondary | ICD-10-CM | POA: Diagnosis not present

## 2017-05-08 DIAGNOSIS — N2581 Secondary hyperparathyroidism of renal origin: Secondary | ICD-10-CM | POA: Diagnosis not present

## 2017-05-08 DIAGNOSIS — N183 Chronic kidney disease, stage 3 (moderate): Secondary | ICD-10-CM | POA: Diagnosis not present

## 2017-05-08 DIAGNOSIS — D631 Anemia in chronic kidney disease: Secondary | ICD-10-CM | POA: Diagnosis not present

## 2017-05-08 DIAGNOSIS — R7989 Other specified abnormal findings of blood chemistry: Secondary | ICD-10-CM | POA: Diagnosis not present

## 2017-05-08 DIAGNOSIS — R809 Proteinuria, unspecified: Secondary | ICD-10-CM | POA: Diagnosis not present

## 2017-05-11 DIAGNOSIS — N183 Chronic kidney disease, stage 3 (moderate): Secondary | ICD-10-CM | POA: Diagnosis not present

## 2017-05-11 DIAGNOSIS — Z7901 Long term (current) use of anticoagulants: Secondary | ICD-10-CM | POA: Diagnosis not present

## 2017-05-15 DIAGNOSIS — I5022 Chronic systolic (congestive) heart failure: Secondary | ICD-10-CM | POA: Diagnosis not present

## 2017-05-15 DIAGNOSIS — I429 Cardiomyopathy, unspecified: Secondary | ICD-10-CM | POA: Diagnosis not present

## 2017-05-15 DIAGNOSIS — I472 Ventricular tachycardia: Secondary | ICD-10-CM | POA: Diagnosis not present

## 2017-05-15 DIAGNOSIS — I428 Other cardiomyopathies: Secondary | ICD-10-CM | POA: Diagnosis not present

## 2017-05-15 DIAGNOSIS — N183 Chronic kidney disease, stage 3 (moderate): Secondary | ICD-10-CM | POA: Diagnosis not present

## 2017-05-15 DIAGNOSIS — Q210 Ventricular septal defect: Secondary | ICD-10-CM | POA: Diagnosis not present

## 2017-05-15 DIAGNOSIS — E039 Hypothyroidism, unspecified: Secondary | ICD-10-CM | POA: Diagnosis not present

## 2017-05-15 DIAGNOSIS — R0602 Shortness of breath: Secondary | ICD-10-CM | POA: Diagnosis not present

## 2017-05-15 DIAGNOSIS — I700 Atherosclerosis of aorta: Secondary | ICD-10-CM | POA: Diagnosis not present

## 2017-05-15 DIAGNOSIS — E668 Other obesity: Secondary | ICD-10-CM | POA: Diagnosis not present

## 2017-05-15 DIAGNOSIS — Z95810 Presence of automatic (implantable) cardiac defibrillator: Secondary | ICD-10-CM | POA: Diagnosis not present

## 2017-05-15 DIAGNOSIS — Z7901 Long term (current) use of anticoagulants: Secondary | ICD-10-CM | POA: Diagnosis not present

## 2017-05-25 ENCOUNTER — Encounter: Payer: Self-pay | Admitting: Internal Medicine

## 2017-05-31 ENCOUNTER — Encounter: Payer: Medicare Other | Admitting: Internal Medicine

## 2017-06-06 DIAGNOSIS — Z95810 Presence of automatic (implantable) cardiac defibrillator: Secondary | ICD-10-CM | POA: Diagnosis not present

## 2017-06-06 DIAGNOSIS — Z7901 Long term (current) use of anticoagulants: Secondary | ICD-10-CM | POA: Diagnosis not present

## 2017-06-06 DIAGNOSIS — I700 Atherosclerosis of aorta: Secondary | ICD-10-CM | POA: Diagnosis not present

## 2017-06-06 DIAGNOSIS — I429 Cardiomyopathy, unspecified: Secondary | ICD-10-CM | POA: Diagnosis not present

## 2017-06-06 DIAGNOSIS — R0602 Shortness of breath: Secondary | ICD-10-CM | POA: Diagnosis not present

## 2017-06-06 DIAGNOSIS — Q210 Ventricular septal defect: Secondary | ICD-10-CM | POA: Diagnosis not present

## 2017-06-06 DIAGNOSIS — I428 Other cardiomyopathies: Secondary | ICD-10-CM | POA: Diagnosis not present

## 2017-06-06 DIAGNOSIS — I472 Ventricular tachycardia: Secondary | ICD-10-CM | POA: Diagnosis not present

## 2017-06-06 DIAGNOSIS — N183 Chronic kidney disease, stage 3 (moderate): Secondary | ICD-10-CM | POA: Diagnosis not present

## 2017-06-06 DIAGNOSIS — E039 Hypothyroidism, unspecified: Secondary | ICD-10-CM | POA: Diagnosis not present

## 2017-06-06 DIAGNOSIS — E668 Other obesity: Secondary | ICD-10-CM | POA: Diagnosis not present

## 2017-06-06 DIAGNOSIS — I5022 Chronic systolic (congestive) heart failure: Secondary | ICD-10-CM | POA: Diagnosis not present

## 2017-06-15 DIAGNOSIS — I5022 Chronic systolic (congestive) heart failure: Secondary | ICD-10-CM | POA: Diagnosis not present

## 2017-06-15 DIAGNOSIS — Z95810 Presence of automatic (implantable) cardiac defibrillator: Secondary | ICD-10-CM | POA: Diagnosis not present

## 2017-06-15 DIAGNOSIS — Q210 Ventricular septal defect: Secondary | ICD-10-CM | POA: Diagnosis not present

## 2017-06-15 DIAGNOSIS — R0602 Shortness of breath: Secondary | ICD-10-CM | POA: Diagnosis not present

## 2017-06-15 DIAGNOSIS — E039 Hypothyroidism, unspecified: Secondary | ICD-10-CM | POA: Diagnosis not present

## 2017-06-15 DIAGNOSIS — N183 Chronic kidney disease, stage 3 (moderate): Secondary | ICD-10-CM | POA: Diagnosis not present

## 2017-06-15 DIAGNOSIS — E668 Other obesity: Secondary | ICD-10-CM | POA: Diagnosis not present

## 2017-06-15 DIAGNOSIS — Z7901 Long term (current) use of anticoagulants: Secondary | ICD-10-CM | POA: Diagnosis not present

## 2017-06-15 DIAGNOSIS — I700 Atherosclerosis of aorta: Secondary | ICD-10-CM | POA: Diagnosis not present

## 2017-06-15 DIAGNOSIS — I472 Ventricular tachycardia: Secondary | ICD-10-CM | POA: Diagnosis not present

## 2017-06-15 DIAGNOSIS — I428 Other cardiomyopathies: Secondary | ICD-10-CM | POA: Diagnosis not present

## 2017-06-15 DIAGNOSIS — I429 Cardiomyopathy, unspecified: Secondary | ICD-10-CM | POA: Diagnosis not present

## 2017-07-03 DIAGNOSIS — I472 Ventricular tachycardia: Secondary | ICD-10-CM | POA: Diagnosis not present

## 2017-07-03 DIAGNOSIS — R0602 Shortness of breath: Secondary | ICD-10-CM | POA: Diagnosis not present

## 2017-07-03 DIAGNOSIS — I5022 Chronic systolic (congestive) heart failure: Secondary | ICD-10-CM | POA: Diagnosis not present

## 2017-07-03 DIAGNOSIS — Q210 Ventricular septal defect: Secondary | ICD-10-CM | POA: Diagnosis not present

## 2017-07-03 DIAGNOSIS — Z95810 Presence of automatic (implantable) cardiac defibrillator: Secondary | ICD-10-CM | POA: Diagnosis not present

## 2017-07-03 DIAGNOSIS — E039 Hypothyroidism, unspecified: Secondary | ICD-10-CM | POA: Diagnosis not present

## 2017-07-03 DIAGNOSIS — N183 Chronic kidney disease, stage 3 (moderate): Secondary | ICD-10-CM | POA: Diagnosis not present

## 2017-07-03 DIAGNOSIS — I700 Atherosclerosis of aorta: Secondary | ICD-10-CM | POA: Diagnosis not present

## 2017-07-03 DIAGNOSIS — R0600 Dyspnea, unspecified: Secondary | ICD-10-CM | POA: Diagnosis not present

## 2017-07-03 DIAGNOSIS — I428 Other cardiomyopathies: Secondary | ICD-10-CM | POA: Diagnosis not present

## 2017-07-03 DIAGNOSIS — Z7901 Long term (current) use of anticoagulants: Secondary | ICD-10-CM | POA: Diagnosis not present

## 2017-07-03 DIAGNOSIS — E668 Other obesity: Secondary | ICD-10-CM | POA: Diagnosis not present

## 2017-07-05 DIAGNOSIS — I472 Ventricular tachycardia: Secondary | ICD-10-CM | POA: Diagnosis not present

## 2017-07-05 DIAGNOSIS — I429 Cardiomyopathy, unspecified: Secondary | ICD-10-CM | POA: Diagnosis not present

## 2017-07-05 DIAGNOSIS — R0600 Dyspnea, unspecified: Secondary | ICD-10-CM | POA: Diagnosis not present

## 2017-07-05 DIAGNOSIS — R0602 Shortness of breath: Secondary | ICD-10-CM | POA: Diagnosis not present

## 2017-07-05 DIAGNOSIS — N183 Chronic kidney disease, stage 3 (moderate): Secondary | ICD-10-CM | POA: Diagnosis not present

## 2017-07-05 DIAGNOSIS — Z7901 Long term (current) use of anticoagulants: Secondary | ICD-10-CM | POA: Diagnosis not present

## 2017-07-05 DIAGNOSIS — I700 Atherosclerosis of aorta: Secondary | ICD-10-CM | POA: Diagnosis not present

## 2017-07-05 DIAGNOSIS — Q210 Ventricular septal defect: Secondary | ICD-10-CM | POA: Diagnosis not present

## 2017-07-05 DIAGNOSIS — E039 Hypothyroidism, unspecified: Secondary | ICD-10-CM | POA: Diagnosis not present

## 2017-07-05 DIAGNOSIS — Z95810 Presence of automatic (implantable) cardiac defibrillator: Secondary | ICD-10-CM | POA: Diagnosis not present

## 2017-07-05 DIAGNOSIS — E668 Other obesity: Secondary | ICD-10-CM | POA: Diagnosis not present

## 2017-07-05 DIAGNOSIS — I5022 Chronic systolic (congestive) heart failure: Secondary | ICD-10-CM | POA: Diagnosis not present

## 2017-07-05 DIAGNOSIS — I428 Other cardiomyopathies: Secondary | ICD-10-CM | POA: Diagnosis not present

## 2017-07-06 ENCOUNTER — Encounter: Payer: Self-pay | Admitting: Cardiology

## 2017-07-06 ENCOUNTER — Other Ambulatory Visit: Payer: Self-pay | Admitting: Cardiology

## 2017-07-06 ENCOUNTER — Inpatient Hospital Stay (HOSPITAL_COMMUNITY)
Admission: AD | Admit: 2017-07-06 | Discharge: 2017-07-11 | DRG: 291 | Disposition: A | Discharge status: Home Health | Payer: Medicare Other | Source: Ambulatory Visit | Attending: Cardiology | Admitting: Cardiology

## 2017-07-06 ENCOUNTER — Other Ambulatory Visit: Payer: Self-pay

## 2017-07-06 DIAGNOSIS — N184 Chronic kidney disease, stage 4 (severe): Secondary | ICD-10-CM | POA: Diagnosis present

## 2017-07-06 DIAGNOSIS — I272 Pulmonary hypertension, unspecified: Secondary | ICD-10-CM | POA: Diagnosis present

## 2017-07-06 DIAGNOSIS — Z79899 Other long term (current) drug therapy: Secondary | ICD-10-CM | POA: Diagnosis not present

## 2017-07-06 DIAGNOSIS — I5021 Acute systolic (congestive) heart failure: Secondary | ICD-10-CM | POA: Diagnosis not present

## 2017-07-06 DIAGNOSIS — F329 Major depressive disorder, single episode, unspecified: Secondary | ICD-10-CM | POA: Diagnosis present

## 2017-07-06 DIAGNOSIS — Z7901 Long term (current) use of anticoagulants: Secondary | ICD-10-CM | POA: Diagnosis not present

## 2017-07-06 DIAGNOSIS — Z8711 Personal history of peptic ulcer disease: Secondary | ICD-10-CM | POA: Diagnosis not present

## 2017-07-06 DIAGNOSIS — Z86718 Personal history of other venous thrombosis and embolism: Secondary | ICD-10-CM | POA: Diagnosis not present

## 2017-07-06 DIAGNOSIS — I4729 Other ventricular tachycardia: Secondary | ICD-10-CM

## 2017-07-06 DIAGNOSIS — I130 Hypertensive heart and chronic kidney disease with heart failure and stage 1 through stage 4 chronic kidney disease, or unspecified chronic kidney disease: Secondary | ICD-10-CM | POA: Diagnosis present

## 2017-07-06 DIAGNOSIS — I428 Other cardiomyopathies: Secondary | ICD-10-CM | POA: Diagnosis present

## 2017-07-06 DIAGNOSIS — Z90710 Acquired absence of both cervix and uterus: Secondary | ICD-10-CM | POA: Diagnosis not present

## 2017-07-06 DIAGNOSIS — I472 Ventricular tachycardia: Secondary | ICD-10-CM

## 2017-07-06 DIAGNOSIS — I340 Nonrheumatic mitral (valve) insufficiency: Secondary | ICD-10-CM | POA: Diagnosis present

## 2017-07-06 DIAGNOSIS — I480 Paroxysmal atrial fibrillation: Secondary | ICD-10-CM | POA: Diagnosis not present

## 2017-07-06 DIAGNOSIS — R0602 Shortness of breath: Secondary | ICD-10-CM | POA: Diagnosis not present

## 2017-07-06 DIAGNOSIS — I700 Atherosclerosis of aorta: Secondary | ICD-10-CM | POA: Diagnosis present

## 2017-07-06 DIAGNOSIS — Z95810 Presence of automatic (implantable) cardiac defibrillator: Secondary | ICD-10-CM | POA: Diagnosis not present

## 2017-07-06 DIAGNOSIS — E039 Hypothyroidism, unspecified: Secondary | ICD-10-CM | POA: Diagnosis present

## 2017-07-06 DIAGNOSIS — I5023 Acute on chronic systolic (congestive) heart failure: Secondary | ICD-10-CM | POA: Diagnosis not present

## 2017-07-06 DIAGNOSIS — Z885 Allergy status to narcotic agent status: Secondary | ICD-10-CM | POA: Diagnosis not present

## 2017-07-06 DIAGNOSIS — F419 Anxiety disorder, unspecified: Secondary | ICD-10-CM | POA: Diagnosis present

## 2017-07-06 DIAGNOSIS — Q210 Ventricular septal defect: Secondary | ICD-10-CM

## 2017-07-06 DIAGNOSIS — M109 Gout, unspecified: Secondary | ICD-10-CM | POA: Diagnosis present

## 2017-07-06 DIAGNOSIS — I509 Heart failure, unspecified: Secondary | ICD-10-CM | POA: Diagnosis not present

## 2017-07-06 HISTORY — DX: Unspecified thoracic, thoracolumbar and lumbosacral intervertebral disc disorder: M51.9

## 2017-07-06 HISTORY — DX: Atherosclerosis of aorta: I70.0

## 2017-07-06 HISTORY — DX: Ventricular septal defect: Q21.0

## 2017-07-06 HISTORY — DX: Chronic kidney disease, stage 3 (moderate): N18.3

## 2017-07-06 HISTORY — DX: Chronic kidney disease, stage 3 unspecified: N18.30

## 2017-07-06 HISTORY — DX: Gout, unspecified: M10.9

## 2017-07-06 HISTORY — DX: Hypothyroidism, unspecified: E03.9

## 2017-07-06 HISTORY — DX: Personal history of peptic ulcer disease: Z87.11

## 2017-07-06 LAB — COMPREHENSIVE METABOLIC PANEL
ALK PHOS: 122 U/L (ref 38–126)
ALT: 23 U/L (ref 14–54)
AST: 28 U/L (ref 15–41)
Albumin: 3.7 g/dL (ref 3.5–5.0)
Anion gap: 13 (ref 5–15)
BUN: 32 mg/dL — AB (ref 6–20)
CALCIUM: 9.3 mg/dL (ref 8.9–10.3)
CHLORIDE: 111 mmol/L (ref 101–111)
CO2: 23 mmol/L (ref 22–32)
CREATININE: 2.1 mg/dL — AB (ref 0.44–1.00)
GFR calc non Af Amer: 21 mL/min — ABNORMAL LOW (ref >60)
GFR, EST AFRICAN AMERICAN: 24 mL/min — AB (ref >60)
Glucose, Bld: 130 mg/dL — ABNORMAL HIGH (ref 65–99)
Potassium: 3.6 mmol/L (ref 3.5–5.1)
SODIUM: 147 mmol/L — AB (ref 135–145)
Total Bilirubin: 2.2 mg/dL — ABNORMAL HIGH (ref 0.3–1.2)
Total Protein: 6.1 g/dL — ABNORMAL LOW (ref 6.5–8.1)

## 2017-07-06 LAB — CBC WITH DIFFERENTIAL/PLATELET
BASOS PCT: 1 %
Basophils Absolute: 0.1 10*3/uL (ref 0.0–0.1)
EOS ABS: 0.1 10*3/uL (ref 0.0–0.7)
EOS PCT: 1 %
HCT: 46.3 % — ABNORMAL HIGH (ref 36.0–46.0)
HEMOGLOBIN: 14.7 g/dL (ref 12.0–15.0)
LYMPHS PCT: 16 %
Lymphs Abs: 0.9 10*3/uL (ref 0.7–4.0)
MCH: 35.2 pg — AB (ref 26.0–34.0)
MCHC: 31.7 g/dL (ref 30.0–36.0)
MCV: 110.8 fL — AB (ref 78.0–100.0)
Monocytes Absolute: 0.4 10*3/uL (ref 0.1–1.0)
Monocytes Relative: 7 %
NEUTROS PCT: 75 %
Neutro Abs: 4.4 10*3/uL (ref 1.7–7.7)
Platelets: 145 10*3/uL — ABNORMAL LOW (ref 150–400)
RBC: 4.18 MIL/uL (ref 3.87–5.11)
RDW: 14.9 % (ref 11.5–15.5)
WBC: 5.9 10*3/uL (ref 4.0–10.5)

## 2017-07-06 LAB — PROTIME-INR
INR: 2.17
Prothrombin Time: 24 seconds — ABNORMAL HIGH (ref 11.4–15.2)

## 2017-07-06 LAB — TSH: TSH: 5.959 u[IU]/mL — ABNORMAL HIGH (ref 0.350–4.500)

## 2017-07-06 LAB — BRAIN NATRIURETIC PEPTIDE: B Natriuretic Peptide: 4340.1 pg/mL — ABNORMAL HIGH (ref 0.0–100.0)

## 2017-07-06 MED ORDER — WARFARIN SODIUM 2 MG PO TABS
2.0000 mg | ORAL_TABLET | Freq: Once | ORAL | Status: AC
  Administered 2017-07-06: 2 mg via ORAL
  Filled 2017-07-06: qty 1

## 2017-07-06 MED ORDER — ALLOPURINOL 100 MG PO TABS
100.0000 mg | ORAL_TABLET | Freq: Every day | ORAL | Status: DC
  Administered 2017-07-06 – 2017-07-11 (×6): 100 mg via ORAL
  Filled 2017-07-06 (×6): qty 1

## 2017-07-06 MED ORDER — METOPROLOL SUCCINATE ER 25 MG PO TB24
25.0000 mg | ORAL_TABLET | Freq: Every day | ORAL | Status: DC
  Administered 2017-07-06 – 2017-07-11 (×5): 25 mg via ORAL
  Filled 2017-07-06 (×6): qty 1

## 2017-07-06 MED ORDER — AMIODARONE HCL 200 MG PO TABS
200.0000 mg | ORAL_TABLET | Freq: Every day | ORAL | Status: DC
  Administered 2017-07-06 – 2017-07-11 (×6): 200 mg via ORAL
  Filled 2017-07-06 (×6): qty 1

## 2017-07-06 MED ORDER — ONDANSETRON HCL 4 MG/2ML IJ SOLN: 4.0000 mg | Freq: Four times a day (QID) | INTRAMUSCULAR | Status: DC | PRN

## 2017-07-06 MED ORDER — FUROSEMIDE 10 MG/ML IJ SOLN
80.0000 mg | Freq: Four times a day (QID) | INTRAMUSCULAR | Status: DC
  Administered 2017-07-06 – 2017-07-07 (×3): 80 mg via INTRAVENOUS
  Filled 2017-07-06 (×4): qty 8

## 2017-07-06 MED ORDER — SODIUM CHLORIDE 0.9% FLUSH
3.0000 mL | Freq: Two times a day (BID) | INTRAVENOUS | Status: DC
  Administered 2017-07-06 – 2017-07-09 (×3): 3 mL via INTRAVENOUS

## 2017-07-06 MED ORDER — LEVOTHYROXINE SODIUM 75 MCG PO TABS
75.0000 ug | ORAL_TABLET | Freq: Every day | ORAL | Status: DC
  Administered 2017-07-07 – 2017-07-11 (×5): 75 ug via ORAL
  Filled 2017-07-06 (×5): qty 1

## 2017-07-06 MED ORDER — SODIUM CHLORIDE 0.9 % IV SOLN: 250.0000 mL | INTRAVENOUS | Status: DC | PRN

## 2017-07-06 MED ORDER — WARFARIN - PHARMACIST DOSING INPATIENT: Freq: Every day | Status: DC

## 2017-07-06 MED ORDER — ACETAMINOPHEN 325 MG PO TABS: 650.0000 mg | ORAL_TABLET | ORAL | Status: DC | PRN

## 2017-07-06 MED ORDER — SODIUM CHLORIDE 0.9% FLUSH: 3.0000 mL | INTRAVENOUS | Status: DC | PRN

## 2017-07-06 NOTE — H&P (Addendum)
History and Physical   Admit date: 07/06/2017 Name:  Casey [name] Medical record number: [id number] DOB/Age:  [birth year]  83 y.o. female  Primary physician: Dr. Renold Genta  Primary Cardiologist: Dr. Wynonia Lawman  Chief complaint/reason for admission: Severe shortness of breath  HPI:  This 83 year old female has a history of a nonischemic cardiomyopathy and a previous small ventricular septal defect.  At catheterization in May 2005 she had normal coronary arteries.  She had a biventricular defibrillator placed and had a fractured defibrillator lead replaced because of inappropriate shocks in November 2007.  She has had generator changes since then.  She has been managed and would get along fairly well but began to have worsening heart failure this year.  She had been on ACE inhibitors previously but these developed worsening renal insufficiency and she was found to have an atrophic kidney.  We attempted to place her on an ARB as well as I tried Delene Loll as an outpatient but this also resulted in worsening renal failure and she wound up having to be seen by the nephrologist again who advised that we discontinue this.  She has had intermittent weight gain that we have managed with increasing her diuretics.  Over the past several days she has had worsening dyspnea and came in yesterday for a pro time showing severe dyspnea on exertion yesterday.  A BNP level was 3300 but she did not want to go into the hospital yesterday.  I increased her furosemide to 120 mg in the morning and 80 in the afternoon but today she was so severely dyspneic with reduced urine output that we decided to put her in the hospital for diuresis.  Her most recent echocardiogram was done in the office and showed an ejection fraction of 10-15%.  There was mild dyssynchrony of the septum.  She also has a history of ventricular tachycardia and has been managed with amiodarone for this.  She does not have angina.  She does have significant  chronic kidney disease.  She has significant bend apnea as well as nocturia.  She has a history of paroxysmal atrial fibrillation in the past and is anticoagulated with warfarin.    Past Medical History:  Diagnosis Date  . Atherosclerosis of aorta ([place])   . Biventricular ICD (implantable cardiac defibrillator) in place    medtronic   . Chronic kidney disease (CKD), stage III (moderate) (HCC)   . Chronic systolic heart failure ([place])   . Gout   . History of peptic ulcer disease   . Hypertension   . Hypothyroidism   . Lumbar disc disease   . Nonischemic cardiomyopathy ([place])   . Ventricular septal defect   . VT (ventricular tachycardia) ([place])    appropriate therapy via ICD 2013     Past Surgical History:  Procedure Laterality Date  . ABDOMINAL HYSTERECTOMY    . BIV ICD GENERTAOR CHANGE OUT N/A 04/16/2013   Procedure: BIV ICD GENERTAOR CHANGE OUT;  Surgeon: Deboraha Sprang, MD;  Location: [facility] CATH LAB;  Service: Cardiovascular;  Laterality: N/A;  . CESAREAN SECTION    . FEMUR IM NAIL     Allergies: is allergic to codeine.   Medications: Prior to Admission medications   Medication Sig Start Date End Date Taking? Authorizing Provider  allopurinol (ZYLOPRIM) 100 MG tablet TAKE 1 TABLET BY MOUTH EVERY DAY 05/02/16   Elby Showers, MD  amiodarone (PACERONE) 200 MG tablet Take 200 mg by mouth daily.    [provider]  furosemide (LASIX)  40 MG tablet Take 40 mg by mouth 2 (two) times daily.     [provider]  levothyroxine (SYNTHROID, LEVOTHROID) 50 MCG tablet Take 50 mcg by mouth daily before breakfast.    [provider]  metoprolol (LOPRESSOR) 50 MG tablet Take 50 mg by mouth daily.     [provider]  Multiple Vitamins-Calcium (ONE-A-DAY WOMENS FORMULA) TABS Take 1 tablet by mouth daily.      [provider]  vitamin D, CHOLECALCIFEROL, 400 UNITS tablet Take 800 Units by mouth daily.    [provider]  warfarin (COUMADIN) 2.5  MG tablet Take 2.5 mg by mouth daily.     [provider]   Family History:  Family Status  Relation Name Status  . Mother  Deceased at age 57       CHF  . Father  Deceased       unknown type of cancer  . MGM  Deceased  . MGF  Deceased  . PGM  Deceased  . PGF  Deceased    Social History:   reports that  has never smoked. she has never used smokeless tobacco. She reports that she does not drink alcohol or use drugs.   She is a retired Magazine features editor.   Review of Systems: She has had significant emotional distress due to her husband's illness who is currently in a nursing home.  She has significant anxiety and depression.  She has chronic low back pain and has significant lumbar disc disease and has had significant injections in this area in the past.  She has reduced vision on the right she has had weight gain as well as malaise and fatigue.  There is no history of stroke or TIA. Other than as noted above, the remainder of the review of systems is normal  Physical Exam: BP 116/67 (BP Location: Right Arm)   Pulse 74   Resp 14   Ht 5\' 2"  (1.575 m)   Wt 73.8 kg (162 lb 12.8 oz)   SpO2 98%   BMI 29.78 kg/m   General appearance: Mildly obese black female in moderate respiratory distress Head: Normocephalic, without obvious abnormality, atraumatic Eyes: conjunctivae/corneas clear. PERRL, EOM's intact. Fundi benign. Neck: no adenopathy, no carotid bruit, supple, symmetrical, trachea midline and JVD increased with V waves Lungs: Increased respiratory rate, reduced breath sounds with faint crackles at bases Heart: Regular rate and rhythm, normal S1 and S2, S3 noted, 2/6 systolic murmur Abdomen: Distended, slight fluid wave noted, liver appears slightly enlarged, nontender Pelvic: deferred Extremities: Normal range of motion, 2+ peripheral edema is present Pulses: 2+ and symmetric Skin: Skin color, texture, turgor normal. No rashes or lesions Neurologic: Grossly normal   Psych: Alert and oriented x3  Labs: Pending at the time of dictation  EKG: Sinus rhythm with paced ventricular beat Independently reviewed by me  Radiology: Pending at the time of dictation   IMPRESSIONS: 1.  Acute on chronic systolic congestive heart failure 2.  Nonischemic cardiomyopathy 3.  Functioning biventricular defibrillator 4.  Stage IV chronic kidney disease with atrophic kidney 5.  History of ventricular septal defect small 6.  Pulmonary hypertension 7.  Aortic atherosclerosis 8.  History of left arm DVT following defibrillator placement 9.  Ventricular tachycardia suppressed with amiodarone 10.  History of paroxysmal atrial fibrillation 11.  Long-term use of anticoagulation  PLAN: The patient is failed to respond to outpatient medical therapy with higher doses of diuretics.  She had a BNP level of  over 3000 the day prior to admission.  She will be admitted for inpatient diuresis.  She will have evaluation by the advanced heart failure team while she is in. Check [place] as well as repeat ECHO. MIght need to get renal involved as well as they see her as an outpatient.   Signed: Kerry Hough MD [facility] Cardiology  07/06/2017, 6:26 PM

## 2017-07-06 NOTE — Progress Notes (Signed)
Pt's SpO2= 91% on RA. Pt placed on 2L nasal cannula. Pt SpO2= 98% on 2L nasal cannula.

## 2017-07-06 NOTE — Progress Notes (Signed)
ANTICOAGULATION CONSULT NOTE - Initial Consult  Pharmacy Consult for warfarin Indication: atrial fibrillation  Allergies  Allergen Reactions  . Codeine     Unknown    Patient Measurements: Height: 5\' 2"  (157.5 cm) Weight: 162 lb 12.8 oz (73.8 kg) IBW/kg (Calculated) : 50.1   Vital Signs: Temp: 98.2 F (36.8 C) (01/25 1843) Temp Source: Oral (01/25 1843) BP: 116/67 (01/25 1725) Pulse Rate: 74 (01/25 1725)  Labs: Recent Labs    07/06/17 1900  HGB 14.7  HCT 46.3*  PLT 145*  LABPROT 24.0*  INR 2.17    CrCl cannot be calculated (Patient's most recent lab result is older than the maximum 21 days allowed.).    Assessment: Casey [name] is an 83 year old female admitted 1/25 with severe shortness of breath. Patient has a history of atrial fibrillation and takes warfarin at home. INR on admission is therapeutic at 2.17 and CBC is stable. No signs of bleeding have been noted.   PTA warfarin dose: 2 mg daily with last dose 1/24 at 1900.  Patient is on amiodarone, allopurinol and levothyroxine as PTA which can increase levels of warfarin.  Goal of Therapy:  INR 2-3 Monitor platelets by anticoagulation protocol: Yes   Plan:  Warfarin 2 mg X 1 dose Daily INR Monitor for signs/symptoms of bleeding   Jimmy Footman, PharmD, BCPS PGY2 Infectious Diseases Pharmacy Resident Pager: [phone]  07/06/2017,8:26 PM

## 2017-07-07 ENCOUNTER — Inpatient Hospital Stay (HOSPITAL_COMMUNITY): Payer: Medicare Other

## 2017-07-07 ENCOUNTER — Encounter (HOSPITAL_COMMUNITY): Payer: Self-pay

## 2017-07-07 DIAGNOSIS — N184 Chronic kidney disease, stage 4 (severe): Secondary | ICD-10-CM

## 2017-07-07 DIAGNOSIS — I480 Paroxysmal atrial fibrillation: Secondary | ICD-10-CM

## 2017-07-07 DIAGNOSIS — I5023 Acute on chronic systolic (congestive) heart failure: Secondary | ICD-10-CM

## 2017-07-07 LAB — BASIC METABOLIC PANEL
Anion gap: 12 (ref 5–15)
BUN: 31 mg/dL — ABNORMAL HIGH (ref 6–20)
CALCIUM: 8.9 mg/dL (ref 8.9–10.3)
CO2: 24 mmol/L (ref 22–32)
Chloride: 108 mmol/L (ref 101–111)
Creatinine, Ser: 2.02 mg/dL — ABNORMAL HIGH (ref 0.44–1.00)
GFR, EST AFRICAN AMERICAN: 26 mL/min — AB (ref >60)
GFR, EST NON AFRICAN AMERICAN: 22 mL/min — AB (ref >60)
Glucose, Bld: 106 mg/dL — ABNORMAL HIGH (ref 65–99)
Potassium: 3.5 mmol/L (ref 3.5–5.1)
SODIUM: 144 mmol/L (ref 135–145)

## 2017-07-07 LAB — PROTIME-INR
INR: 2.03
PROTHROMBIN TIME: 22.8 s — AB (ref 11.4–15.2)

## 2017-07-07 MED ORDER — HYDRALAZINE HCL 10 MG PO TABS
10.0000 mg | ORAL_TABLET | Freq: Three times a day (TID) | ORAL | Status: DC
  Administered 2017-07-07 – 2017-07-11 (×9): 10 mg via ORAL
  Filled 2017-07-07 (×12): qty 1

## 2017-07-07 MED ORDER — POTASSIUM CHLORIDE CRYS ER 20 MEQ PO TBCR
40.0000 meq | EXTENDED_RELEASE_TABLET | Freq: Once | ORAL | Status: AC
  Administered 2017-07-07: 40 meq via ORAL
  Filled 2017-07-07: qty 2

## 2017-07-07 MED ORDER — ISOSORBIDE MONONITRATE ER 30 MG PO TB24
15.0000 mg | ORAL_TABLET | Freq: Every day | ORAL | Status: DC
  Administered 2017-07-07 – 2017-07-08 (×2): 15 mg via ORAL
  Filled 2017-07-07 (×3): qty 1

## 2017-07-07 MED ORDER — FUROSEMIDE 10 MG/ML IJ SOLN
80.0000 mg | Freq: Three times a day (TID) | INTRAMUSCULAR | Status: DC
  Administered 2017-07-07 – 2017-07-08 (×3): 80 mg via INTRAVENOUS
  Filled 2017-07-07 (×2): qty 8

## 2017-07-07 MED ORDER — WARFARIN SODIUM 2 MG PO TABS
2.0000 mg | ORAL_TABLET | Freq: Every day | ORAL | Status: DC
  Administered 2017-07-07 – 2017-07-08 (×2): 2 mg via ORAL
  Filled 2017-07-07 (×2): qty 1

## 2017-07-07 NOTE — Progress Notes (Signed)
  Echocardiogram 2D Echocardiogram has been performed.  Casey [name] 07/07/2017, 2:09 PM

## 2017-07-07 NOTE — Progress Notes (Signed)
[place] for warfarin Indication: atrial fibrillation  Allergies  Allergen Reactions  . Codeine     Unknown    Patient Measurements: Height: 5\' 2"  (157.5 cm) Weight: 163 lb 4.8 oz (74.1 kg) IBW/kg (Calculated) : 50.1   Vital Signs: Temp: 97.7 F (36.5 C) (01/26 0730) Temp Source: Oral (01/26 0730) BP: 100/62 (01/26 0730) Pulse Rate: 72 (01/26 0730)  Labs: Recent Labs    07/06/17 1900 07/07/17 0726  HGB 14.7  --   HCT 46.3*  --   PLT 145*  --   LABPROT 24.0* 22.8*  INR 2.17 2.03  CREATININE 2.10* 2.02*    Estimated Creatinine Clearance: 20.9 mL/min (A) (by C-G formula based on SCr of 2.02 mg/dL (H)).    Assessment: Casey [name] is an 83 year old female admitted 1/25 with severe shortness of breath and noted with HF (last EF= 10-15%. Patient has a history of atrial fibrillation and takes warfarin at home. INR on admissionwas 2.17.  PTA warfarin dose: 2 mg daily with last dose 1/24 at 1900.   Goal of Therapy:  INR 2-3 Monitor platelets by anticoagulation protocol: Yes   Plan:  Warfarin 2 mg po daily Daily INR  Hildred Laser, Pharm D 07/07/2017 10:44 AM

## 2017-07-07 NOTE — Care Management Note (Signed)
Case Management Note  Patient Details  Name: Casey [name] MRN: [id number] Date of Birth: September 30, 1934  Subjective/Objective:  From home alone, she states her spouse is in [facility].  She has been falling at home, will need pt/ot eval.  She states she has a son who lives here in [place], he is a Art gallery manager.  She states her POA is her daughter Peri Jefferson and son in [place], they live in [place].  She does not use any DME at home.  She has PCP and medication coverage.                   Action/Plan: NCM will follow for dc needs.   Expected Discharge Date:                  Expected Discharge Plan:  [place]  In-House Referral:     Discharge planning Services  CM Consult  Post Acute Care Choice:    Choice offered to:     DME Arranged:    DME Agency:     HH Arranged:    [place] Agency:     Status of Service:  In process, will continue to follow  If discussed at Long Length of Stay Meetings, dates discussed:    Additional Comments:  Zenon Mayo, RN 07/07/2017, 1:28 PM

## 2017-07-07 NOTE — Consult Note (Signed)
Advanced Heart Failure Team Consult Note   Primary Cardiologist: Dr. Wynonia Lawman  Reason for Consultation: CHF  HPI:    Casey [name] is seen today for evaluation of CHF at the request of Dr. Wynonia Lawman.   19 with long history of nonischemic cardiomyopathy, now CKD stage 3-4.  She had cath in 5/05 with normal coronaries.  Has MDT CRT-D device.  Not on ACEI/ARB/ARNI due to renal dysfunction (tried all in the past).  On warfarin with history of PAF.  Last echo in 5/17 with EF 20%, moderate MR, moderate TR, small VSD.    She has developed steadily worsening volume overload and dyspnea despite increase in diuretics as an outpatient.  Therefore, she was admitted on 1/25 for IV diuresis.  She has remained in NSR.  CXR today was clear.  She appears to be BiV pacing.  She says that she urinated well overnight but I/Os not recorded completely (says she was flushing urine). She is subjectively feeling better but still orthopneic.    Review of Systems: All systems reviewed and negative except as per HPI.   Home Medications Prior to Admission medications   Medication Sig Start Date End Date Taking? Authorizing Provider  allopurinol (ZYLOPRIM) 100 MG tablet TAKE 1 TABLET BY MOUTH EVERY DAY Patient taking differently: TAKE 1 TABLET (100mg ) BY MOUTH EVERY DAY 05/02/16  Yes Baxley, Cresenciano Lick, MD  amiodarone (PACERONE) 200 MG tablet Take 200 mg by mouth daily.   Yes [provider]  brinzolamide (AZOPT) 1 % ophthalmic suspension Place 1 drop into both eyes 3 (three) times daily.   Yes [provider]  furosemide (LASIX) 40 MG tablet Take 80-120 mg by mouth See admin instructions. 120mg  in the morning and 80mg  at night   Yes [provider]  latanoprost (XALATAN) 0.005 % ophthalmic solution Place 1 drop into both eyes at bedtime.   Yes [provider]  levothyroxine (SYNTHROID, LEVOTHROID) 75 MCG tablet Take 75 mcg by mouth daily before breakfast.    Yes [provider]  lisinopril (PRINIVIL,ZESTRIL) 20 MG tablet Take 20 mg by mouth 2 (two) times daily.    Yes [provider]  metoprolol succinate (TOPROL-XL) 50 MG 24 hr tablet Take 50 mg by mouth daily. Take with or immediately following a meal.   Yes [provider]  Multiple Vitamins-Calcium (ONE-A-DAY WOMENS FORMULA) TABS Take 1 tablet by mouth daily.     Yes [provider]  potassium chloride SA (K-DUR,KLOR-CON) 20 MEQ tablet Take 20 mEq by mouth daily.    Yes [provider]  vitamin D, CHOLECALCIFEROL, 400 UNITS tablet Take 800 Units by mouth daily.   Yes [provider]  warfarin (COUMADIN) 2 MG tablet Take 2 mg by mouth daily.    Yes [provider]  meclizine (ANTIVERT) 25 MG tablet Take 1 tablet (25 mg total) by mouth at bedtime. Patient not taking: Reported on 07/06/2017 01/26/17   Elby Showers, MD    Past Medical History: 1. Chronic systolic CHF: Nonischemic cardiomyopathy, long-standing.  LHC in 5/05 with normal coronaries.   - MDT CRT-D. - Echo (5/17) with EF 20%, moderate MR, moderate TR, small VSD. 2. CKD stage 3-4 - Worsening renal function with ACEI/ARB/ARNI 3. H/o VT: On amiodarone.  4. Atrial fibrillation: Paroxysmal.  5. VSD: Small, restrictive.  6. H/o left arm DVT (associated with device).  7. Gout 8. HTN 9. Hypothyroidism 10. PUD  Past Surgical History: Past Surgical History:  Procedure  Laterality Date  . ABDOMINAL HYSTERECTOMY    . BIV ICD GENERTAOR CHANGE OUT N/A 04/16/2013   Procedure: BIV ICD GENERTAOR CHANGE OUT;  Surgeon: Deboraha Sprang, MD;  Location: [facility] CATH LAB;  Service: Cardiovascular;  Laterality: N/A;  . CESAREAN SECTION    . FEMUR IM NAIL      Family History: Family History  Problem Relation Age of Onset  . Heart failure Mother   . Cancer Father     Social History: Social History   Socioeconomic History  . Marital status: Married    Spouse name: None  . Number of children: None   . Years of education: None  . Highest education level: None  Social Needs  . Financial resource strain: None  . Food insecurity - worry: None  . Food insecurity - inability: None  . Transportation needs - medical: None  . Transportation needs - non-medical: None  Occupational History  . None  Tobacco Use  . Smoking status: Never Smoker  . Smokeless tobacco: Never Used  Substance and Sexual Activity  . Alcohol use: No  . Drug use: No  . Sexual activity: None  Other Topics Concern  . None  Social History Narrative  . None    Allergies:  Allergies  Allergen Reactions  . Codeine     Unknown    Objective:    Vital Signs:   Temp:  [97.4 F (36.3 C)-98.2 F (36.8 C)] 97.6 F (36.4 C) (01/26 0520) Pulse Rate:  [65-74] 70 (01/26 0520) Resp:  [14-18] 18 (01/26 0520) BP: (95-116)/(53-67) 95/63 (01/26 0520) SpO2:  [98 %-100 %] 99 % (01/26 0520) Weight:  [162 lb 12.8 oz (73.8 kg)-163 lb 4.8 oz (74.1 kg)] 163 lb 4.8 oz (74.1 kg) (01/26 0300) Last BM Date: 07/06/17  Weight change: Filed Weights   07/06/17 1600 07/07/17 0300  Weight: 162 lb 12.8 oz (73.8 kg) 163 lb 4.8 oz (74.1 kg)    Intake/Output:   Intake/Output Summary (Last 24 hours) at 07/07/2017 1035 Last data filed at 07/07/2017 0900 Gross per 24 hour  Intake 240 ml  Output 300 ml  Net -60 ml      Physical Exam    General: NAD Neck: JVP 14+ cm, no thyromegaly or thyroid nodule.  Lungs: Slight crackles at bases. CV: Lateral PMI.  Heart regular S1/S2, no S3/S4, no murmur.  1+ edema to knees bilaterally.   Abdomen: Soft, nontender, no hepatosplenomegaly, mild distention.  Skin: Intact without lesions or rashes.  Neurologic: Alert and oriented x 3.  Psych: Normal affect. Extremities: No clubbing or cyanosis.  HEENT: Normal.    Telemetry   NSR with BiV pacing (personally reviewed).  EKG    NSR with BiV pacing (personally reviewed)  Labs   Basic Metabolic Panel: Recent Labs  Lab 07/06/17 1900  07/07/17 0726  NA 147* 144  K 3.6 3.5  CL 111 108  CO2 23 24  GLUCOSE 130* 106*  BUN 32* 31*  CREATININE 2.10* 2.02*  CALCIUM 9.3 8.9    Liver Function Tests: Recent Labs  Lab 07/06/17 1900  AST 28  ALT 23  ALKPHOS 122  BILITOT 2.2*  PROT 6.1*  ALBUMIN 3.7   No results for input(s): LIPASE, AMYLASE in the last 168 hours. No results for input(s): AMMONIA in the last 168 hours.  CBC: Recent Labs  Lab 07/06/17 1900  WBC 5.9  NEUTROABS 4.4  HGB 14.7  HCT 46.3*  MCV 110.8*  PLT 145*  Cardiac Enzymes: No results for input(s): CKTOTAL, CKMB, CKMBINDEX, TROPONINI in the last 168 hours.  BNP: BNP (last 3 results) Recent Labs    07/06/17 1900  BNP 4,340.1*    ProBNP (last 3 results) No results for input(s): PROBNP in the last 8760 hours.   CBG: No results for input(s): GLUCAP in the last 168 hours.  Coagulation Studies: Recent Labs    07/06/17 1900 07/07/17 0726  LABPROT 24.0* 22.8*  INR 2.17 2.03     Imaging   Dg Chest 2 View  Result Date: 07/07/2017 CLINICAL DATA:  Congestive heart failure. EXAM: CHEST  2 VIEW COMPARISON:  April 10, 2017 FINDINGS: Stable cardiomegaly and AICD device. No pneumothorax. No pulmonary nodules, masses, focal infiltrates, or overt edema. IMPRESSION: No active cardiopulmonary disease. Electronically Signed   By: Dorise Bullion III M.D   On: 07/07/2017 08:14      Medications:     Current Medications: . allopurinol  100 mg Oral Daily  . amiodarone  200 mg Oral Daily  . furosemide  80 mg Intravenous Q8H  . hydrALAZINE  10 mg Oral Q8H  . isosorbide mononitrate  15 mg Oral Daily  . levothyroxine  75 mcg Oral QAC breakfast  . metoprolol succinate  25 mg Oral Daily  . potassium chloride  40 mEq Oral Once  . sodium chloride flush  3 mL Intravenous Q12H  . Warfarin - Pharmacist Dosing Inpatient   Does not apply q1800     Infusions: . sodium chloride        Assessment/Plan   1. Acute on chronic systolic  CHF: Long-standing NICM, 5/05 cath without significant CAD.  Echo in 5/17 with EF 20%.  Complicated by CKD stage 3-4.  Has MDT CRT-D device. On exam today, she is volume overloaded.  Per report, diuresing well but I/Os not completely recorded.  - Needs I/Os and daily weights.  - Continue Lasix 80 mg IV every 8 hrs.  - Continue Toprol XL 25 mg daily.  - No ACEI/ARB/ARNI: has failed in the past with AKI.  - Add hydralazine 10 mg tid + Imdur 15 daily.   - If BP/renal function stable, will add spironolactone 12.5 daily.  - Repeat echo.  2. Atrial fibrillation: Paroxysmal.  In NSR on amiodarone.  - Continue amiodarone.  - Continue warfarin, INR therapeutic.  3. CKD: Stage 4.  Follow carefully with diuresis, down some today (likely with lowering of renal venous pressure).  4. H/o VSD: likely restrictive/small, getting echo today.   Length of Stay: 1  Loralie Champagne, MD  07/07/2017, 10:35 AM  Advanced Heart Failure Team Pager [phone] (M-F; 7a - 4p)  Please contact [facility] for night-coverage after hours (4p -7a ) and weekends on [email or web address]

## 2017-07-08 LAB — BASIC METABOLIC PANEL
ANION GAP: 12 (ref 5–15)
BUN: 42 mg/dL — AB (ref 6–20)
CALCIUM: 9.3 mg/dL (ref 8.9–10.3)
CO2: 21 mmol/L — AB (ref 22–32)
Chloride: 108 mmol/L (ref 101–111)
Creatinine, Ser: 2.31 mg/dL — ABNORMAL HIGH (ref 0.44–1.00)
GFR calc Af Amer: 22 mL/min — ABNORMAL LOW (ref >60)
GFR calc non Af Amer: 19 mL/min — ABNORMAL LOW (ref >60)
GLUCOSE: 109 mg/dL — AB (ref 65–99)
Potassium: 4.2 mmol/L (ref 3.5–5.1)
Sodium: 141 mmol/L (ref 135–145)

## 2017-07-08 LAB — CBC WITH DIFFERENTIAL/PLATELET
BASOS PCT: 1 %
Basophils Absolute: 0.1 10*3/uL (ref 0.0–0.1)
EOS PCT: 3 %
Eosinophils Absolute: 0.2 10*3/uL (ref 0.0–0.7)
HEMATOCRIT: 45.4 % (ref 36.0–46.0)
Hemoglobin: 14 g/dL (ref 12.0–15.0)
LYMPHS ABS: 1.2 10*3/uL (ref 0.7–4.0)
Lymphocytes Relative: 24 %
MCH: 34.3 pg — ABNORMAL HIGH (ref 26.0–34.0)
MCHC: 30.8 g/dL (ref 30.0–36.0)
MCV: 111.3 fL — AB (ref 78.0–100.0)
MONOS PCT: 10 %
Monocytes Absolute: 0.5 10*3/uL (ref 0.1–1.0)
NEUTROS PCT: 62 %
Neutro Abs: 3.1 10*3/uL (ref 1.7–7.7)
Platelets: 138 10*3/uL — ABNORMAL LOW (ref 150–400)
RBC: 4.08 MIL/uL (ref 3.87–5.11)
RDW: 14.5 % (ref 11.5–15.5)
WBC: 5.1 10*3/uL (ref 4.0–10.5)

## 2017-07-08 LAB — PROTIME-INR
INR: 1.99
Prothrombin Time: 22.4 seconds — ABNORMAL HIGH (ref 11.4–15.2)

## 2017-07-08 MED ORDER — METOLAZONE 2.5 MG PO TABS
2.5000 mg | ORAL_TABLET | Freq: Once | ORAL | Status: AC
  Administered 2017-07-08: 2.5 mg via ORAL
  Filled 2017-07-08: qty 1

## 2017-07-08 MED ORDER — FUROSEMIDE 10 MG/ML IJ SOLN
80.0000 mg | Freq: Two times a day (BID) | INTRAMUSCULAR | Status: DC
  Administered 2017-07-08 – 2017-07-09 (×3): 80 mg via INTRAVENOUS
  Filled 2017-07-08 (×3): qty 8

## 2017-07-08 MED ORDER — SODIUM CHLORIDE 0.9% FLUSH
10.0000 mL | INTRAVENOUS | Status: DC | PRN
  Administered 2017-07-10: 20 mL
  Filled 2017-07-08: qty 40

## 2017-07-08 NOTE — Progress Notes (Signed)
Patient ID: Casey [name], female   DOB: [birth year], 83 y.o.   MRN: [id number]     Advanced Heart Failure Rounding Note  Primary Cardiologist: Wynonia Lawman HF Cardiology: Aundra Dubin  Subjective:    Subjectively, she is breathing better today.  Less of an effort to get out of bed.  However, I/Os fairly even and no significant weight change.  Creatinine 2 => 2.3.    Objective:   Weight Range: 164 lb (74.4 kg) Body mass index is 30 kg/m.   Vital Signs:   Temp:  [97.5 F (36.4 C)-97.8 F (36.6 C)] 97.5 F (36.4 C) (01/27 0447) Pulse Rate:  [58-66] 66 (01/27 0447) Resp:  [16-18] 18 (01/27 0447) BP: (98-115)/(55-66) 115/65 (01/27 0447) SpO2:  [97 %-100 %] 100 % (01/27 0447) Weight:  [164 lb (74.4 kg)] 164 lb (74.4 kg) (01/27 0447) Last BM Date: 07/06/17  Weight change: Filed Weights   07/06/17 1600 07/07/17 0300 07/08/17 0447  Weight: 162 lb 12.8 oz (73.8 kg) 163 lb 4.8 oz (74.1 kg) 164 lb (74.4 kg)    Intake/Output:   Intake/Output Summary (Last 24 hours) at 07/08/2017 0936 Last data filed at 07/08/2017 0900 Gross per 24 hour  Intake 1320 ml  Output 1500 ml  Net -180 ml      Physical Exam    General:  Well appearing. No resp difficulty HEENT: Normal Neck: Supple. JVP 14 cm.  No lymphadenopathy or thyromegaly appreciated. Cor: PMI nondisplaced. Regular rate & rhythm. No rubs, gallops. 2/6 systolic murmur along sternal border.  Lungs: Clear Abdomen: Soft, nontender, nondistended. No hepatosplenomegaly. No bruits or masses. Good bowel sounds. Extremities: No cyanosis, clubbing, rash. 1+ ankle edema.  Neuro: Alert & orientedx3, cranial nerves grossly intact. moves all 4 extremities w/o difficulty. Affect pleasant   Telemetry   NSR with BiV pacing (personally reviewed).   Labs    CBC Recent Labs    07/06/17 1900 07/08/17 0555  WBC 5.9 5.1  NEUTROABS 4.4 3.1  HGB 14.7 14.0  HCT 46.3* 45.4  MCV 110.8* 111.3*  PLT 145* 086*   Basic Metabolic Panel Recent  Labs    07/07/17 0726 07/08/17 0555  NA 144 141  K 3.5 4.2  CL 108 108  CO2 24 21*  GLUCOSE 106* 109*  BUN 31* 42*  CREATININE 2.02* 2.31*  CALCIUM 8.9 9.3   Liver Function Tests Recent Labs    07/06/17 1900  AST 28  ALT 23  ALKPHOS 122  BILITOT 2.2*  PROT 6.1*  ALBUMIN 3.7   No results for input(s): LIPASE, AMYLASE in the last 72 hours. Cardiac Enzymes No results for input(s): CKTOTAL, CKMB, CKMBINDEX, TROPONINI in the last 72 hours.  BNP: BNP (last 3 results) Recent Labs    07/06/17 1900  BNP 4,340.1*    ProBNP (last 3 results) No results for input(s): PROBNP in the last 8760 hours.   D-Dimer No results for input(s): DDIMER in the last 72 hours. Hemoglobin A1C No results for input(s): HGBA1C in the last 72 hours. Fasting Lipid Panel No results for input(s): CHOL, HDL, LDLCALC, TRIG, CHOLHDL, LDLDIRECT in the last 72 hours. Thyroid Function Tests Recent Labs    07/06/17 1900  TSH 5.959*    Other results:   Imaging     No results found.   Medications:     Scheduled Medications: . allopurinol  100 mg Oral Daily  . amiodarone  200 mg Oral Daily  . furosemide  80 mg Intravenous BID  . hydrALAZINE  10 mg Oral Q8H  . isosorbide mononitrate  15 mg Oral Daily  . levothyroxine  75 mcg Oral QAC breakfast  . metolazone  2.5 mg Oral Once  . metoprolol succinate  25 mg Oral Daily  . sodium chloride flush  3 mL Intravenous Q12H  . warfarin  2 mg Oral q1800  . Warfarin - Pharmacist Dosing Inpatient   Does not apply q1800     Infusions: . sodium chloride       PRN Medications:  sodium chloride, acetaminophen, ondansetron (ZOFRAN) IV, sodium chloride flush    Assessment/Plan   1. Acute on chronic systolic CHF: Long-standing NICM, 5/05 cath without significant CAD.  Echo in 5/17 with EF 20%.  Complicated by CKD stage 3-4.  Has MDT CRT-D device. Echo done yesterday but not yet read.  She feels better symptomatically but still volume  overloaded on exam and does not appear to have diuresed well yesterday.  Creatinine 2=>2.3.  - Lasix 80 mg IV bid today with metolazone 2.5 mg x 1.   - Continue Toprol XL 25 mg daily.  - No ACEI/ARB/ARNI: has failed in the past with AKI.  - Continue hydralazine 10 mg tid + Imdur 15 daily, no BP room to increase.   - With elevated creatinine and difficulty diuresing, will place PICC line (poor HD candidate) and follow co-ox and CVP.  Can send to step-down after PICC placed.  If co-ox low, will add milrinone gtt.  2. Atrial fibrillation: Paroxysmal.  In NSR on amiodarone.  - Continue amiodarone.  - Continue warfarin per pharmacy.  3. CKD: Stage 4.  Follow carefully with diuresis, creatinine 2.3 today.  4. H/o VSD: likely restrictive/small, getting echo today.    Length of Stay: 2  Loralie Champagne, MD  07/08/2017, 9:36 AM  Advanced Heart Failure Team Pager [phone] (M-F; 7a - 4p)  Please contact [facility] for night-coverage after hours (4p -7a ) and weekends on [email or web address]

## 2017-07-08 NOTE — Progress Notes (Signed)
Pt ambulated to restroom independently, failed to use BSC with hat to measure output

## 2017-07-08 NOTE — Progress Notes (Signed)
Peripherally Inserted Central Catheter/Midline Placement  The IV Nurse has discussed with the patient and/or persons authorized to consent for the patient, the purpose of this procedure and the potential benefits and risks involved with this procedure.  The benefits include less needle sticks, lab draws from the catheter, and the patient may be discharged home with the catheter. Risks include, but not limited to, infection, bleeding, blood clot (thrombus formation), and puncture of an artery; nerve damage and irregular heartbeat and possibility to perform a PICC exchange if needed/ordered by physician.  Alternatives to this procedure were also discussed.  Bard Power PICC patient education guide, fact sheet on infection prevention and patient information card has been provided to patient /or left at bedside.    PICC/Midline Placement Documentation  PICC Double Lumen 07/08/17 PICC Right Brachial 37 cm 1 cm (Active)  Indication for Insertion or Continuance of Line Vasoactive infusions 07/08/2017 12:47 PM  Exposed Catheter (cm) 1 cm 07/08/2017 12:47 PM  Site Assessment Clean;Dry;Intact 07/08/2017 12:47 PM  Lumen #1 Status Flushed;Saline locked;Cap changed;Blood return noted 07/08/2017 12:47 PM  Lumen #2 Status Flushed;Saline locked;Cap changed;Blood return noted 07/08/2017 12:47 PM  Dressing Type Transparent;Securing device 07/08/2017 12:47 PM  Dressing Status Clean;Dry;Intact;Antimicrobial disc in place 07/08/2017 12:47 PM  Line Care Connections checked and tightened 07/08/2017 12:47 PM  Line Adjustment (NICU/IV Team Only) No 07/08/2017 12:47 PM  Dressing Intervention New dressing 07/08/2017 12:47 PM  Dressing Change Due 07/15/17 07/08/2017 12:47 PM       Christianna Belmonte, Weldon Inches 07/08/2017, 12:49 PM

## 2017-07-08 NOTE — Plan of Care (Signed)
  Progressing Safety: Ability to remain free from injury will improve 07/08/2017 0442 - Progressing by Ardine Eng, RN

## 2017-07-08 NOTE — Progress Notes (Signed)
[facility] for warfarin Indication: atrial fibrillation  Allergies  Allergen Reactions  . Codeine     Unknown    Patient Measurements: Height: 5\' 2"  (157.5 cm) Weight: 164 lb (74.4 kg)(scale c) IBW/kg (Calculated) : 50.1   Vital Signs: Temp: 97.5 F (36.4 C) (01/27 0447) Temp Source: Oral (01/27 0447) BP: 115/65 (01/27 0447) Pulse Rate: 66 (01/27 0447)  Labs: Recent Labs    07/06/17 1900 07/07/17 0726 07/08/17 0555  HGB 14.7  --  14.0  HCT 46.3*  --  45.4  PLT 145*  --  138*  LABPROT 24.0* 22.8* 22.4*  INR 2.17 2.03 1.99  CREATININE 2.10* 2.02* 2.31*    Estimated Creatinine Clearance: 18.3 mL/min (A) (by C-G formula based on SCr of 2.31 mg/dL (H)).    Assessment: 83 year old female admitted 1/25 with severe shortness of breath and noted with HF. Patient has a history of atrial fibrillation and takes warfarin at home. INR on admission was 2.17. -INR= 1.99  PTA warfarin dose: 2 mg daily with last dose 1/24 at 1900.   Goal of Therapy:  INR 2-3 Monitor platelets by anticoagulation protocol: Yes   Plan:  Warfarin 2 mg po daily Daily INR  Hildred Laser, Pharm D 07/08/2017 10:54 AM

## 2017-07-08 NOTE — Progress Notes (Signed)
No response from page to cards yet, PICC nurse currently at bedside for PICC placement.

## 2017-07-08 NOTE — Evaluation (Signed)
Physical Therapy Evaluation Patient Details Name: Casey [name] MRN: [id number] DOB: 1935/01/16 Today's Date: 07/08/2017   History of Present Illness  61 with long history of nonischemic cardiomyopathy, now CKD stage 3-4.  She had cath in 5/05 with normal coronaries.  Has MDT CRT-D device.  Not on ACEI/ARB/ARNI due to renal dysfunction (tried all in the past).  On warfarin with history of PAF.  Last echo in 5/17 with EF 20%, moderate MR, moderate TR, small VSD.  Presented with worsening dyspnea and volume overload.  Clinical Impression  Pt admitted with above diagnosis. Pt currently with functional limitations due to the deficits listed below (see PT Problem List). Pt ambulated 200' with RW and supervision, she experienced 2/4 DOE during ambulation but O2 sats remained mid 90's on RA. Pt reports that falls have occurred when she has been sitting a long time and then got up and got dizzy. Instructed her to use her rollator consistently and taught exercises for orthostatic hypotension.  Pt will benefit from skilled PT to increase their independence and safety with mobility to allow discharge to the venue listed below.       Follow Up Recommendations Home health PT;Supervision - Intermittent    Equipment Recommendations  None recommended by PT    Recommendations for Other Services       Precautions / Restrictions Precautions Precautions: Fall Precaution Comments: 4 recent falls at home Restrictions Weight Bearing Restrictions: No      Mobility  Bed Mobility Overal bed mobility: Modified Independent             General bed mobility comments: pt received sitting EOB  Transfers Overall transfer level: Needs assistance Equipment used: Rolling walker (2 wheeled) Transfers: Sit to/from Stand Sit to Stand: Supervision Stand pivot transfers: Supervision       General transfer comment: pt gets dyspneic just with sit to stand transfer but O2 sats remain 95% on RA. vc's for  hand placement  Ambulation/Gait Ambulation/Gait assistance: Supervision Ambulation Distance (Feet): 200 Feet Assistive device: Rolling walker (2 wheeled) Gait Pattern/deviations: Step-through pattern Gait velocity: decreased Gait velocity interpretation: Below normal speed for age/gender General Gait Details: 2/4 DOE with ambulation, pt fatigued at 100' and needed to turn back to room. O2 sats remained mid 90's. HR 68 bpm.   Stairs            Wheelchair Mobility    Modified Rankin (Stroke Patients Only)       Balance Overall balance assessment: Needs assistance Sitting-balance support: No upper extremity supported Sitting balance-Leahy Scale: Good     Standing balance support: No upper extremity supported Standing balance-Leahy Scale: Fair                               Pertinent Vitals/Pain Pain Assessment: No/denies pain    Home Living Family/patient expects to be discharged to:: Private residence Living Arrangements: Alone Available Help at Discharge: Family;Available PRN/intermittently Type of Home: House Home Access: Stairs to enter   Entrance Stairs-Number of Steps: 3 Home Layout: One level Home Equipment: Walker - 4 wheels Additional Comments: pt's husband is at [facility] right now (after CVA) so she is living alone. Her daughter assists when she asks for help but she does not live in [place].     Prior Function Level of Independence: Independent with assistive device(s)         Comments: pt has been using rollator since falling in the  summer (though did not have it with her for subsequent falls). She reports she was independent but sometimes gets dizzy     Hand Dominance   Dominant Hand: Right    Extremity/Trunk Assessment   Upper Extremity Assessment Upper Extremity Assessment: Defer to OT evaluation    Lower Extremity Assessment Lower Extremity Assessment: Overall WFL for tasks assessed    Cervical / Trunk Assessment Cervical  / Trunk Assessment: Normal  Communication   Communication: No difficulties  Cognition Arousal/Alertness: Awake/alert Behavior During Therapy: WFL for tasks assessed/performed Overall Cognitive Status: Within Functional Limits for tasks assessed                                        General Comments General comments (skin integrity, edema, etc.): discussed management of orthostatic hypotension since she mentioned dizziness when she first stands after sitting for a long period. Pt voiced understanding.     Exercises     Assessment/Plan    PT Assessment Patient needs continued PT services  PT Problem List Decreased balance;Decreased activity tolerance;Cardiopulmonary status limiting activity;Decreased knowledge of precautions;Decreased mobility       PT Treatment Interventions DME instruction;Gait training;Stair training;Functional mobility training;Therapeutic activities;Therapeutic exercise;Balance training;Patient/family education    PT Goals (Current goals can be found in the Care Plan section)  Acute Rehab PT Goals Patient Stated Goal: remain independent PT Goal Formulation: With patient Time For Goal Achievement: 07/22/17 Potential to Achieve Goals: Good    Frequency Min 3X/week   Barriers to discharge Decreased caregiver support lives alone right now    Co-evaluation               AM-PAC PT "6 Clicks" Daily Activity  Outcome Measure Difficulty turning over in bed (including adjusting bedclothes, sheets and blankets)?: None Difficulty moving from lying on back to sitting on the side of the bed? : None Difficulty sitting down on and standing up from a chair with arms (e.g., wheelchair, bedside commode, etc,.)?: A Little Help needed moving to and from a bed to chair (including a wheelchair)?: A Little Help needed walking in hospital room?: A Little Help needed climbing 3-5 steps with a railing? : A Little 6 Click Score: 20    End of Session  Equipment Utilized During Treatment: Gait belt Activity Tolerance: Patient tolerated treatment well Patient left: in chair;with call bell/phone within reach;with chair alarm set Nurse Communication: Mobility status PT Visit Diagnosis: Unsteadiness on feet (R26.81);History of falling (Z91.81);Dizziness and giddiness (R42)    Time: 5625-6389 PT Time Calculation (min) (ACUTE ONLY): 21 min   Charges:   PT Evaluation $PT Eval Moderate Complexity: 1 Mod     PT G Codes:        Leighton Roach, PT  Acute Rehab Services  [facility] 07/08/2017, 1:49 PM

## 2017-07-08 NOTE — Progress Notes (Signed)
Page to Advanced heart failure team regarding BP 98/55 HR 59 and pending AM meds pacerone, metoprolol and imdur.

## 2017-07-08 NOTE — Evaluation (Signed)
Occupational Therapy Evaluation Patient Details Name: Casey [name] MRN: [id number] DOB: 09/26/1934 Today's Date: 07/08/2017    History of Present Illness PATIENT ADMITTED WITH A ON C CHF. AFIB CHRONIC KIDNEY DX.    Clinical Impression   PATIENT HAS DECREASED I AND SAFETY WITH PERFORMING ADLS AND MOBILITY. PATIENT WANTS TO D/C HOME AND WOULD BENEFIT FROM HHOT TO DECREASE RISK OF FURTHER FALLS AT HOME. ACUTE OT TO FOLLOW.     Follow Up Recommendations  Home health OT(PATIENT HAS HAD SEVERAL FALLS AND MAY BENEFIT FROM [facility] )    Equipment Recommendations  3 in 1 bedside commode;Tub/shower bench    Recommendations for Other Services       Precautions / Restrictions Precautions Precautions: Fall Precaution Comments: PATIENT REPORTS 4 RECENT FALLS AT HOME.  Restrictions Weight Bearing Restrictions: No      Mobility Bed Mobility Overal bed mobility: Modified Independent                Transfers Overall transfer level: Needs assistance Equipment used: Rolling walker (2 wheeled) Transfers: Stand Pivot Transfers   Stand pivot transfers: Supervision       General transfer comment: cues for proper hand placement    Balance                                           ADL either performed or assessed with clinical judgement   ADL Overall ADL's : Needs assistance/impaired Eating/Feeding: Independent   Grooming: Wash/dry hands;Wash/dry face;Supervision/safety;Standing   Upper Body Bathing: Supervision/ safety;Set up;Sitting   Lower Body Bathing: Supervison/ safety;Set up;Sit to/from stand   Upper Body Dressing : Supervision/safety;Set up;Sitting   Lower Body Dressing: Supervision/safety;Set up;Sit to/from stand   Toilet Transfer: Supervision/safety;Cueing for safety;Ambulation;Comfort height toilet Toilet Transfer Details (indicate cue type and reason): (patient requirs cues for proper hand placement for transfers) Toileting- Clothing  Manipulation and Hygiene: Supervision/safety       Functional mobility during ADLs: Supervision/safety;Cueing for safety;Rolling walker General ADL Comments: Patient is s with adls with cues for proper hand placement for sit to stand and stand to sit.      Vision Baseline Vision/History: Wears glasses;Glaucoma Wears Glasses: At all times Patient Visual Report: No change from baseline(PATIENT REPORTS HX OF B  CATARACT SX)       Perception     Praxis      Pertinent Vitals/Pain Pain Assessment: No/denies pain     Hand Dominance Right   Extremity/Trunk Assessment Upper Extremity Assessment Upper Extremity Assessment: Overall WFL for tasks assessed           Communication Communication Communication: No difficulties   Cognition Arousal/Alertness: Awake/alert Behavior During Therapy: WFL for tasks assessed/performed Overall Cognitive Status: Within Functional Limits for tasks assessed                                     General Comments       Exercises     Shoulder Instructions      Home Living Family/patient expects to be discharged to:: Private residence Living Arrangements: Alone Available Help at Discharge: Available PRN/intermittently(PATIENT HUSBAND IS AT A SNF CURRENTLY SECONDARY TO A CVA. ) Type of Home: House       Home Layout: One level     Bathroom Shower/Tub: Tub/shower unit  Bathroom Toilet: Handicapped height     Home Equipment: Walker - 4 wheels   Additional Comments: PATIENT STATES DAUGHTER IS ABLE TO ASSIST. PATIENT STATES THAT DTR IS WILLING TO LET PATIENT MOVE IN WITH THEM.       Prior Functioning/Environment Level of Independence: Independent with assistive device(s)        Comments: PATIENT REPORTS  THAT SHE WAS DOING COOKING, CLEANING, SHOPPING AND WAS DRIVING.         OT Problem List: Decreased activity tolerance;Decreased knowledge of use of DME or AE      OT Treatment/Interventions: Self-care/ADL  training;DME and/or AE instruction;Therapeutic activities;Patient/family education    OT Goals(Current goals can be found in the care plan section) Acute Rehab OT Goals Patient Stated Goal: PATENT WANTS TO GET STRONGER. OT Goal Formulation: With patient Time For Goal Achievement: 07/22/17 Potential to Achieve Goals: Good  OT Frequency: Min 2X/week   Barriers to D/C: Decreased caregiver support          Co-evaluation              AM-PAC PT "6 Clicks" Daily Activity     Outcome Measure Help from another person eating meals?: None Help from another person taking care of personal grooming?: A Little Help from another person toileting, which includes using toliet, bedpan, or urinal?: A Little Help from another person bathing (including washing, rinsing, drying)?: A Little Help from another person to put on and taking off regular upper body clothing?: A Little Help from another person to put on and taking off regular lower body clothing?: A Little 6 Click Score: 19   End of Session Equipment Utilized During Treatment: Gait belt;Rolling walker  Activity Tolerance: Patient tolerated treatment well Patient left: (left with PT)  OT Visit Diagnosis: Unsteadiness on feet (R26.81)                Time: 9292-4462 OT Time Calculation (min): 29 min Charges:  OT General Charges $OT Visit: 1 Visit OT Evaluation $OT Eval Moderate Complexity: 1 Mod OT Treatments $Self Care/Home Management : 8-22 mins G-Codes:    6 CLICKS  Tyner Codner 07/08/2017, 12:06 PM

## 2017-07-09 LAB — BASIC METABOLIC PANEL
ANION GAP: 12 (ref 5–15)
BUN: 47 mg/dL — ABNORMAL HIGH (ref 6–20)
CHLORIDE: 102 mmol/L (ref 101–111)
CO2: 27 mmol/L (ref 22–32)
Calcium: 9.4 mg/dL (ref 8.9–10.3)
Creatinine, Ser: 2.44 mg/dL — ABNORMAL HIGH (ref 0.44–1.00)
GFR calc non Af Amer: 18 mL/min — ABNORMAL LOW (ref >60)
GFR, EST AFRICAN AMERICAN: 20 mL/min — AB (ref >60)
GLUCOSE: 110 mg/dL — AB (ref 65–99)
POTASSIUM: 3.9 mmol/L (ref 3.5–5.1)
Sodium: 141 mmol/L (ref 135–145)

## 2017-07-09 LAB — CBC WITH DIFFERENTIAL/PLATELET
BASOS ABS: 0 10*3/uL (ref 0.0–0.1)
Basophils Relative: 1 %
EOS PCT: 3 %
Eosinophils Absolute: 0.1 10*3/uL (ref 0.0–0.7)
HEMATOCRIT: 44.3 % (ref 36.0–46.0)
HEMOGLOBIN: 14.1 g/dL (ref 12.0–15.0)
LYMPHS PCT: 23 %
Lymphs Abs: 1.2 10*3/uL (ref 0.7–4.0)
MCH: 34.7 pg — ABNORMAL HIGH (ref 26.0–34.0)
MCHC: 31.8 g/dL (ref 30.0–36.0)
MCV: 109.1 fL — AB (ref 78.0–100.0)
Monocytes Absolute: 0.7 10*3/uL (ref 0.1–1.0)
Monocytes Relative: 15 %
NEUTROS ABS: 3 10*3/uL (ref 1.7–7.7)
NEUTROS PCT: 58 %
PLATELETS: 137 10*3/uL — AB (ref 150–400)
RBC: 4.06 MIL/uL (ref 3.87–5.11)
RDW: 14.1 % (ref 11.5–15.5)
WBC: 5.1 10*3/uL (ref 4.0–10.5)

## 2017-07-09 LAB — ECHOCARDIOGRAM COMPLETE
HEIGHTINCHES: 62 in
WEIGHTICAEL: 2612.8 [oz_av]

## 2017-07-09 LAB — COOXEMETRY PANEL
CARBOXYHEMOGLOBIN: 1.5 % (ref 0.5–1.5)
METHEMOGLOBIN: 1.3 % (ref 0.0–1.5)
O2 Saturation: 84.7 %
Total hemoglobin: 13.9 g/dL (ref 12.0–16.0)

## 2017-07-09 LAB — PROTIME-INR
INR: 1.97
Prothrombin Time: 22.3 seconds — ABNORMAL HIGH (ref 11.4–15.2)

## 2017-07-09 MED ORDER — POTASSIUM CHLORIDE CRYS ER 20 MEQ PO TBCR
40.0000 meq | EXTENDED_RELEASE_TABLET | Freq: Once | ORAL | Status: AC
  Administered 2017-07-09: 40 meq via ORAL
  Filled 2017-07-09: qty 2

## 2017-07-09 MED ORDER — ISOSORBIDE MONONITRATE ER 30 MG PO TB24
30.0000 mg | ORAL_TABLET | Freq: Every day | ORAL | Status: DC
  Administered 2017-07-09 – 2017-07-11 (×3): 30 mg via ORAL
  Filled 2017-07-09 (×3): qty 1

## 2017-07-09 MED ORDER — WARFARIN SODIUM 3 MG PO TABS
3.0000 mg | ORAL_TABLET | Freq: Once | ORAL | Status: AC
  Administered 2017-07-09: 3 mg via ORAL
  Filled 2017-07-09: qty 1

## 2017-07-09 MED ORDER — METOLAZONE 2.5 MG PO TABS
2.5000 mg | ORAL_TABLET | Freq: Once | ORAL | Status: AC
  Administered 2017-07-09: 2.5 mg via ORAL
  Filled 2017-07-09: qty 1

## 2017-07-09 NOTE — Progress Notes (Addendum)
Patient is feeling somewhat better.  Less tired but still dyspneic with activity.  Weight is about the same as it was when she came in but has diuresed some.    Appreciate Dr. Aundra Dubin and the advanced heart failure team's help.  Kerry Hough MD [facility]

## 2017-07-09 NOTE — Progress Notes (Signed)
PT Cancellation Note  Patient Details Name: [name] MRN: [id number] DOB: 02/14/1935   Cancelled Treatment:    Reason Eval/Treat Not Completed: Patient declined, no reason specified. Pt agitated on arrival to room stating she was not informed that PT was ordered for her and she does not appreciate people just showing up. Pt agreeable to therapy trying again later in the afternoon. Will check back if time allows.  Benjiman Core, PTA Pager [phone] Acute Rehab   Allena Katz 07/09/2017, 1:10 PM

## 2017-07-09 NOTE — Progress Notes (Signed)
Patient ID: Casey [name], female   DOB: [birth year], 83 y.o.   MRN: [id number]     Advanced Heart Failure Rounding Note  Primary Cardiologist: Wynonia Lawman HF Cardiology: Aundra Dubin  Subjective:    Yesterday diuresed with IV lasix + metolazone. Weight down 2 pounds. Negative 990 cc. Creatinine 2.4. Lives alone.   Feeling better. Denies SOB.   Objective:   Weight Range: 162 lb 9.6 oz (73.8 kg) Body mass index is 29.74 kg/m.   Vital Signs:   Temp:  [97.5 F (36.4 C)-97.8 F (36.6 C)] 97.5 F (36.4 C) (01/28 0442) Pulse Rate:  [55-69] 67 (01/28 0442) Resp:  [20] 20 (01/28 0442) BP: (98-124)/(55-72) 124/72 (01/28 0635) SpO2:  [93 %-100 %] 100 % (01/28 0442) Weight:  [162 lb 9.6 oz (73.8 kg)] 162 lb 9.6 oz (73.8 kg) (01/28 0442) Last BM Date: 07/07/17  Weight change: Filed Weights   07/07/17 0300 07/08/17 0447 07/09/17 0442  Weight: 163 lb 4.8 oz (74.1 kg) 164 lb (74.4 kg) 162 lb 9.6 oz (73.8 kg)    Intake/Output:   Intake/Output Summary (Last 24 hours) at 07/09/2017 0759 Last data filed at 07/09/2017 0737 Gross per 24 hour  Intake 960 ml  Output 2300 ml  Net -1340 ml      Physical Exam    General:  Well appearing. No resp difficulty. In bed.  HEENT: normal Neck: supple. JVP 14 cm. Carotids 2+ bilat; no bruits. No lymphadenopathy or thryomegaly appreciated. Cor: PMI nondisplaced. Regular rate & rhythm. No rubs, gallops or murmurs. Lungs: clear Abdomen: soft, nontender, nondistended. No hepatosplenomegaly. No bruits or masses. Good bowel sounds. Extremities: no cyanosis, clubbing, rash, R and LLE trace edema. RUE PICC Neuro: alert & orientedx3, cranial nerves grossly intact. moves all 4 extremities w/o difficulty. Affect pleasant    Telemetry   NSR biv paced 80s personally reviewed.   Labs    CBC Recent Labs    07/08/17 0555 07/09/17 0332  WBC 5.1 5.1  NEUTROABS 3.1 3.0  HGB 14.0 14.1  HCT 45.4 44.3  MCV 111.3* 109.1*  PLT 138* 137*   Basic  Metabolic Panel Recent Labs    07/08/17 0555 07/09/17 0332  NA 141 141  K 4.2 3.9  CL 108 102  CO2 21* 27  GLUCOSE 109* 110*  BUN 42* 47*  CREATININE 2.31* 2.44*  CALCIUM 9.3 9.4   Liver Function Tests Recent Labs    07/06/17 1900  AST 28  ALT 23  ALKPHOS 122  BILITOT 2.2*  PROT 6.1*  ALBUMIN 3.7   No results for input(s): LIPASE, AMYLASE in the last 72 hours. Cardiac Enzymes No results for input(s): CKTOTAL, CKMB, CKMBINDEX, TROPONINI in the last 72 hours.  BNP: BNP (last 3 results) Recent Labs    07/06/17 1900  BNP 4,340.1*    ProBNP (last 3 results) No results for input(s): PROBNP in the last 8760 hours.   D-Dimer No results for input(s): DDIMER in the last 72 hours. Hemoglobin A1C No results for input(s): HGBA1C in the last 72 hours. Fasting Lipid Panel No results for input(s): CHOL, HDL, LDLCALC, TRIG, CHOLHDL, LDLDIRECT in the last 72 hours. Thyroid Function Tests Recent Labs    07/06/17 1900  TSH 5.959*    Other results:   Imaging    No results found.   Medications:     Scheduled Medications: . allopurinol  100 mg Oral Daily  . amiodarone  200 mg Oral Daily  . furosemide  80 mg Intravenous BID  .  hydrALAZINE  10 mg Oral Q8H  . isosorbide mononitrate  15 mg Oral Daily  . levothyroxine  75 mcg Oral QAC breakfast  . metoprolol succinate  25 mg Oral Daily  . sodium chloride flush  3 mL Intravenous Q12H  . warfarin  2 mg Oral q1800  . Warfarin - Pharmacist Dosing Inpatient   Does not apply q1800    Infusions: . sodium chloride      PRN Medications: sodium chloride, acetaminophen, ondansetron (ZOFRAN) IV, sodium chloride flush, sodium chloride flush    Assessment/Plan   1. Acute on chronic systolic CHF: Long-standing NICM, 5/05 cath without significant CAD.  Echo in 5/17 with EF 20%.  Complicated by CKD stage 3-4.  Has MDT CRT-D device. Echo has not been read. Breathing better overall.  - Volume status improving.  - Continue  IV lasix. Creatinine 2=>2.3=>2.4  - Continue Toprol XL 25 mg daily.  - No ACEI/ARB/ARNI: has failed in the past with AKI.  - Continue hydralazine 10 mg tid + Imdur 15 daily, no BP room to increase.   - CO-OX pending.  2. Atrial fibrillation: Paroxysmal.   -Maintaining NSR.  - Continue amiodarone.  - INR 1.97 - Continue warfarin per pharmacy.  3. CKD: Stage 4.  Follow carefully with diuresis, creatinine 2.3 today.  4. H/o VSD: likely restrictive/small, getting echo today.   Consult cardiac rehab. She will [place] for HF management. Lives alone. Husband currently in SNF due to stroke.   Length of Stay: 3  Amy Clegg, NP  07/09/2017, 7:59 AM  Advanced Heart Failure Team Pager [phone] (M-F; [place])  Please contact [facility] for night-coverage after hours (4p -7a ) and weekends on [email or web address]  Patient seen with NP, agree with the above note.  Co-ox is ok at 84% today, will not start milrinone.  On exam, she remains volume overloaded.  Some diuresis with IV Lasix + metolazone x 1 yesterday and weight down.  Creatinine up slightly . - Will give Lasix 80 mg IV bid + metolazone 2.5 mg po x 1 again today.   Loralie Champagne 07/09/2017 10:03 AM

## 2017-07-09 NOTE — Plan of Care (Signed)
  Progressing Safety: Ability to remain free from injury will improve 07/09/2017 4718 - Progressing by Ardine Eng, RN

## 2017-07-09 NOTE — Progress Notes (Signed)
[place] for warfarin Indication: atrial fibrillation  Allergies  Allergen Reactions  . Codeine     Unknown    Patient Measurements: Height: 5\' 2"  (157.5 cm) Weight: 162 lb 9.6 oz (73.8 kg)(scale c) IBW/kg (Calculated) : 50.1   Vital Signs: Temp: 97.3 F (36.3 C) (01/28 1203) Temp Source: Oral (01/28 1203) BP: 110/54 (01/28 1358) Pulse Rate: 71 (01/28 1358)  Labs: Recent Labs    07/06/17 1900 07/07/17 0726 07/08/17 0555 07/09/17 0332  HGB 14.7  --  14.0 14.1  HCT 46.3*  --  45.4 44.3  PLT 145*  --  138* 137*  LABPROT 24.0* 22.8* 22.4* 22.3*  INR 2.17 2.03 1.99 1.97  CREATININE 2.10* 2.02* 2.31* 2.44*    Estimated Creatinine Clearance: 17.3 mL/min (A) (by C-G formula based on SCr of 2.44 mg/dL (H)).   Assessment: 83 year old female admitted 1/25 with severe shortness of breath and noted with HF. Patient has a history of atrial fibrillation and takes warfarin at home. INR on admission was 2.17.  INR is 1.97 today from 1.99. CBC stable. No signs/symptoms of bleeding. No new medication interactions.   PTA warfarin dose: 2 mg daily with last dose 1/24 at 1900.  Goal of Therapy:  INR 2-3 Monitor platelets by anticoagulation protocol: Yes   Plan:  Increase to warfarin 3 mg tonight once to get into goal range Daily INR and CBC  Doylene Canard, PharmD Clinical Pharmacist  Pager: [phone] Clinical Phone for 07/09/2017 until 3:30pm: [phone] If after 3:30pm, please call main pharmacy at [phone]  07/09/2017 2:38 PM

## 2017-07-09 NOTE — Progress Notes (Signed)
Physical Therapy Treatment Patient Details Name: Casey [name] MRN: [id number] DOB: 1934/08/30 Today's Date: 07/09/2017    History of Present Illness 4 with long history of nonischemic cardiomyopathy, now CKD stage 3-4.  She had cath in 5/05 with normal coronaries.  Has MDT CRT-D device.  Not on ACEI/ARB/ARNI due to renal dysfunction (tried all in the past).  On warfarin with history of PAF.  Last echo in 5/17 with EF 20%, moderate MR, moderate TR, small VSD.  Presented with worsening dyspnea and volume overload.    PT Comments    Patient progressing well towards PT goals. Continues to demonstrate 2/4 DOE and Sp02 dropped to 87% on RA, but recovered quickly to 91% with seated rest break. Encouraged increasing mobility with short bouts of activity to improve 02 and cardiopulmonary endurance. Pt agreeable. Pt with LOB posteriorly when donning gown requiring assist to prevent fall. Will plan for stair training tomorrow as tolerated. Will follow.     Follow Up Recommendations  Home health PT;Supervision - Intermittent     Equipment Recommendations  None recommended by PT    Recommendations for Other Services       Precautions / Restrictions Precautions Precautions: Fall Precaution Comments: 4 recent falls at home Restrictions Weight Bearing Restrictions: No    Mobility  Bed Mobility Overal bed mobility: Modified Independent             General bed mobility comments: No assist needed.   Transfers Overall transfer level: Needs assistance Equipment used: None Transfers: Sit to/from Stand Sit to Stand: Supervision Stand pivot transfers: Supervision       General transfer comment: Stood from EOB without difficulty. SPT bed to [facility] with supervision for safety.   Ambulation/Gait Ambulation/Gait assistance: Min guard Ambulation Distance (Feet): 200 Feet Assistive device: None Gait Pattern/deviations: Step-through pattern;Decreased stride length;Drifts  right/left Gait velocity: decreased Gait velocity interpretation: Below normal speed for age/gender General Gait Details: Slow, mildly unsteady gait with 2/4 DOE. No standing rest breaks needed. Sp02 dropped to 87% on RA. HR stable.    Stairs            Wheelchair Mobility    Modified Rankin (Stroke Patients Only)       Balance Overall balance assessment: Needs assistance Sitting-balance support: Feet supported;No upper extremity supported Sitting balance-Leahy Scale: Good     Standing balance support: During functional activity Standing balance-Leahy Scale: Poor Standing balance comment: Able to perform pericare without issues however when trying to pull on gown over head, pt with LOB posteriorly requiring assist to prevent fall onto bed.                             Cognition Arousal/Alertness: Awake/alert Behavior During Therapy: WFL for tasks assessed/performed Overall Cognitive Status: Within Functional Limits for tasks assessed                                 General Comments: however pt with possible impaired memory- "this is the first time i've been up since being here," despite PT seeing her the other day.      Exercises      General Comments        Pertinent Vitals/Pain Pain Assessment: No/denies pain    Home Living                      Prior  Function            PT Goals (current goals can now be found in the care plan section) Progress towards PT goals: Progressing toward goals    Frequency    Min 3X/week      PT Plan Current plan remains appropriate    Co-evaluation              AM-PAC PT "6 Clicks" Daily Activity  Outcome Measure  Difficulty turning over in bed (including adjusting bedclothes, sheets and blankets)?: None Difficulty moving from lying on back to sitting on the side of the bed? : None Difficulty sitting down on and standing up from a chair with arms (e.g., wheelchair, bedside  commode, etc,.)?: None Help needed moving to and from a bed to chair (including a wheelchair)?: A Little Help needed walking in hospital room?: A Little Help needed climbing 3-5 steps with a railing? : A Little 6 Click Score: 21    End of Session Equipment Utilized During Treatment: Gait belt Activity Tolerance: Patient tolerated treatment well Patient left: in bed;with call bell/phone within reach;with bed alarm set Nurse Communication: Mobility status PT Visit Diagnosis: Unsteadiness on feet (R26.81);History of falling (Z91.81);Dizziness and giddiness (R42)     Time: 3668-1594 PT Time Calculation (min) (ACUTE ONLY): 18 min  Charges:  $Therapeutic Exercise: 8-22 mins                    G Codes:       Wray Kearns, PT, DPT [phone]     Marguarite Arbour A Jevin Camino 07/09/2017, 3:57 PM

## 2017-07-09 NOTE — Progress Notes (Signed)
Occupational Therapy Treatment Patient Details Name: Casey [name] MRN: [id number] DOB: Feb 17, 1935 Today's Date: 07/09/2017    History of present illness 110 with long history of nonischemic cardiomyopathy, now CKD stage 3-4.  She had cath in 5/05 with normal coronaries.  Has MDT CRT-D device.  Not on ACEI/ARB/ARNI due to renal dysfunction (tried all in the past).  On warfarin with history of PAF.  Last echo in 5/17 with EF 20%, moderate MR, moderate TR, small VSD.  Presented with worsening dyspnea and volume overload.   OT comments  PATIENT STATES SHE CAN TAKE CARE OF HERSELF AND DOES NOT NEED ALLTHESE PEOPLE COMING IN. PATIENT WAS AGREEABLE TO ONE MORE SESSION. PATIENT WAS ABLE TO PERFORM DRESSING TASK POST SETUP. PATIENT WAS S TO AMB INTO BATHROOM AND PERFORM TRANSFER WITH CUES FOR PROPER HAND PLACEMENT. PATIENT WAS S WITH SIT TO STAND FROM VARIES SURFACES WITH CUES FOR PROPER HAND PLACEMENT. PATIENT STATES SHE ONLY TAKES SINK BATH AT HOME. PATIENT AGAIN STATED SHE DID NOT WANT FURTHER OT.    Follow Up Recommendations       Equipment Recommendations       Recommendations for Other Services      Precautions / Restrictions Precautions Precautions: Fall Precaution Comments: 4 recent falls at home       Mobility Bed Mobility                  Transfers                      Balance                                           ADL either performed or assessed with clinical judgement   ADL                   Upper Body Dressing : Modified independent;Set up   Lower Body Dressing: Modified independent;Set up   Toilet Transfer: Supervision/safety           Functional mobility during ADLs: (Patient AMB with walker and states she can walk and does not) General ADL Comments: Pt. states shecan take care of herself and does not need therapy coming in.      Vision       Perception     Praxis      Cognition Arousal/Alertness:  Awake/alert Behavior During Therapy: WFL for tasks assessed/performed Overall Cognitive Status: Within Functional Limits for tasks assessed                                          Exercises     Shoulder Instructions       General Comments      Pertinent Vitals/ Pain       Pain Assessment: No/denies pain  Home Living                                          Prior Functioning/Environment              Frequency           Progress Toward Goals  OT Goals(current goals can now be found  in the care plan section)  Progress towards OT goals: Progressing toward goals  Acute Rehab OT Goals Patient Stated Goal: go home  Plan      Co-evaluation                 AM-PAC PT "6 Clicks" Daily Activity     Outcome Measure   Help from another person eating meals?: None Help from another person taking care of personal grooming?: None Help from another person toileting, which includes using toliet, bedpan, or urinal?: A Little Help from another person bathing (including washing, rinsing, drying)?: A Little Help from another person to put on and taking off regular upper body clothing?: A Little Help from another person to put on and taking off regular lower body clothing?: A Little 6 Click Score: 20    End of Session        Activity Tolerance     Patient Left     Nurse Communication          Time: 8485-9276 OT Time Calculation (min): 24 min  Charges: OT General Charges $OT Visit: 1 Visit OT Treatments $Self Care/Home Management : 39-43 mins  6 CLICKS   Lachell Rochette 07/09/2017, 9:57 AM

## 2017-07-10 LAB — CBC WITH DIFFERENTIAL/PLATELET
BASOS PCT: 1 %
Basophils Absolute: 0 10*3/uL (ref 0.0–0.1)
Eosinophils Absolute: 0.1 10*3/uL (ref 0.0–0.7)
Eosinophils Relative: 3 %
HEMATOCRIT: 44.4 % (ref 36.0–46.0)
Hemoglobin: 14.9 g/dL (ref 12.0–15.0)
LYMPHS ABS: 1.1 10*3/uL (ref 0.7–4.0)
Lymphocytes Relative: 21 %
MCH: 35.6 pg — AB (ref 26.0–34.0)
MCHC: 33.6 g/dL (ref 30.0–36.0)
MCV: 106.2 fL — AB (ref 78.0–100.0)
MONO ABS: 0.7 10*3/uL (ref 0.1–1.0)
MONOS PCT: 14 %
NEUTROS ABS: 3.2 10*3/uL (ref 1.7–7.7)
Neutrophils Relative %: 61 %
Platelets: 125 10*3/uL — ABNORMAL LOW (ref 150–400)
RBC: 4.18 MIL/uL (ref 3.87–5.11)
RDW: 14.5 % (ref 11.5–15.5)
WBC: 5.2 10*3/uL (ref 4.0–10.5)

## 2017-07-10 LAB — BASIC METABOLIC PANEL
ANION GAP: 11 (ref 5–15)
BUN: 53 mg/dL — ABNORMAL HIGH (ref 6–20)
CHLORIDE: 99 mmol/L — AB (ref 101–111)
CO2: 29 mmol/L (ref 22–32)
Calcium: 9.5 mg/dL (ref 8.9–10.3)
Creatinine, Ser: 2.57 mg/dL — ABNORMAL HIGH (ref 0.44–1.00)
GFR calc non Af Amer: 17 mL/min — ABNORMAL LOW (ref >60)
GFR, EST AFRICAN AMERICAN: 19 mL/min — AB (ref >60)
Glucose, Bld: 116 mg/dL — ABNORMAL HIGH (ref 65–99)
POTASSIUM: 3.7 mmol/L (ref 3.5–5.1)
SODIUM: 139 mmol/L (ref 135–145)

## 2017-07-10 LAB — COOXEMETRY PANEL
Carboxyhemoglobin: 1.7 % — ABNORMAL HIGH (ref 0.5–1.5)
Methemoglobin: 1.2 % (ref 0.0–1.5)
O2 SAT: 79.6 %
Total hemoglobin: 14.9 g/dL (ref 12.0–16.0)

## 2017-07-10 LAB — PROTIME-INR
INR: 1.96
PROTHROMBIN TIME: 22.2 s — AB (ref 11.4–15.2)

## 2017-07-10 MED ORDER — POTASSIUM CHLORIDE CRYS ER 20 MEQ PO TBCR
40.0000 meq | EXTENDED_RELEASE_TABLET | Freq: Once | ORAL | Status: AC
  Administered 2017-07-10: 40 meq via ORAL
  Filled 2017-07-10: qty 2

## 2017-07-10 MED ORDER — WARFARIN SODIUM 3 MG PO TABS
3.0000 mg | ORAL_TABLET | Freq: Once | ORAL | Status: AC
  Administered 2017-07-10: 3 mg via ORAL
  Filled 2017-07-10: qty 1

## 2017-07-10 MED ORDER — METOLAZONE 2.5 MG PO TABS
2.5000 mg | ORAL_TABLET | Freq: Once | ORAL | Status: AC
  Administered 2017-07-10: 2.5 mg via ORAL
  Filled 2017-07-10: qty 1

## 2017-07-10 MED ORDER — FUROSEMIDE 10 MG/ML IJ SOLN
80.0000 mg | Freq: Once | INTRAMUSCULAR | Status: AC
  Administered 2017-07-10: 80 mg via INTRAVENOUS
  Filled 2017-07-10: qty 8

## 2017-07-10 NOTE — Progress Notes (Signed)
[place] for warfarin Indication: atrial fibrillation  Allergies  Allergen Reactions  . Codeine     Unknown    Patient Measurements: Height: 5\' 2"  (157.5 cm) Weight: 157 lb 4.8 oz (71.4 kg)(scale c) IBW/kg (Calculated) : 50.1   Vital Signs: Temp: 97.7 F (36.5 C) (01/29 0548) Temp Source: Oral (01/29 0548) BP: 96/48 (01/29 1040) Pulse Rate: 63 (01/29 1040)  Labs: Recent Labs    07/08/17 0555 07/09/17 0332 07/10/17 0411  HGB 14.0 14.1 14.9  HCT 45.4 44.3 44.4  PLT 138* 137* 125*  LABPROT 22.4* 22.3* 22.2*  INR 1.99 1.97 1.96  CREATININE 2.31* 2.44* 2.57*    Estimated Creatinine Clearance: 16.2 mL/min (A) (by C-G formula based on SCr of 2.57 mg/dL (H)).   Assessment: 83 year old female admitted 1/25 with severe shortness of breath and noted with HF. Patient has a history of atrial fibrillation and takes warfarin at home. INR on admission was 2.17.  INR is 1.96 today from 1.97, after increased dose of 3 mg last night. CBC stable. No signs/symptoms of bleeding. No new medication interactions.   PTA warfarin dose: 2 mg daily with last dose 1/24 at 1900.  Goal of Therapy:  INR 2-3 Monitor platelets by anticoagulation protocol: Yes   Plan:  Will continue warfarin 3 mg tonight  Daily INR and CBC  Doylene Canard, PharmD Clinical Pharmacist  Pager: [phone] Clinical Phone for 07/10/2017 until 3:30pm: [phone] If after 3:30pm, please call main pharmacy at [phone]  07/10/2017 11:40 AM

## 2017-07-10 NOTE — Progress Notes (Signed)
Patient ID: Casey [name], female   DOB: 1934-11-13, 83 y.o.   MRN: [id number]     Advanced Heart Failure Rounding Note  Primary Cardiologist: Wynonia Lawman HF Cardiology: Aundra Dubin  Subjective:    Good diuresis yesterday, weight down 5 lbs but creatinine to 2.5.  SBP 90s-100s.  Decreased dyspnea, still mild dyspnea walking down hall.   Echo: EF 10-15% with prominent dyssynchrony, moderate LV dilation, moderate-severe MR, PASP 91 mmHg (?if this is from VSD).   Objective:   Weight Range: 157 lb 4.8 oz (71.4 kg) Body mass index is 28.77 kg/m.   Vital Signs:   Temp:  [97.3 F (36.3 C)-98 F (36.7 C)] 97.7 F (36.5 C) (01/29 0548) Pulse Rate:  [62-71] 69 (01/29 0548) Resp:  [18-20] 18 (01/29 0548) BP: (95-110)/(54-61) 98/55 (01/29 0548) SpO2:  [92 %-100 %] 100 % (01/29 0548) Weight:  [157 lb 4.8 oz (71.4 kg)] 157 lb 4.8 oz (71.4 kg) (01/29 0548) Last BM Date: 07/07/17  Weight change: Filed Weights   07/08/17 0447 07/09/17 0442 07/10/17 0548  Weight: 164 lb (74.4 kg) 162 lb 9.6 oz (73.8 kg) 157 lb 4.8 oz (71.4 kg)    Intake/Output:   Intake/Output Summary (Last 24 hours) at 07/10/2017 0848 Last data filed at 07/10/2017 0616 Gross per 24 hour  Intake 480 ml  Output 4200 ml  Net -3720 ml      Physical Exam    General: NAD Neck: JVP 12 cm, no thyromegaly or thyroid nodule.  Lungs: Clear to auscultation bilaterally with normal respiratory effort. CV: Lateral PMI.  Heart regular S1/S2, no S3/S4, 2/6 HSM .  No peripheral edema.   Abdomen: Soft, nontender, no hepatosplenomegaly, mild distention.  Skin: Intact without lesions or rashes.  Neurologic: Alert and oriented x 3.  Psych: Normal affect. Extremities: No clubbing or cyanosis.  HEENT: Normal.    Telemetry   NSR biv paced 80s personally reviewed.   Labs    CBC Recent Labs    07/09/17 0332 07/10/17 0411  WBC 5.1 5.2  NEUTROABS 3.0 3.2  HGB 14.1 14.9  HCT 44.3 44.4  MCV 109.1* 106.2*  PLT 137* 125*    Basic Metabolic Panel Recent Labs    07/09/17 0332 07/10/17 0411  NA 141 139  K 3.9 3.7  CL 102 99*  CO2 27 29  GLUCOSE 110* 116*  BUN 47* 53*  CREATININE 2.44* 2.57*  CALCIUM 9.4 9.5   Liver Function Tests No results for input(s): AST, ALT, ALKPHOS, BILITOT, PROT, ALBUMIN in the last 72 hours. No results for input(s): LIPASE, AMYLASE in the last 72 hours. Cardiac Enzymes No results for input(s): CKTOTAL, CKMB, CKMBINDEX, TROPONINI in the last 72 hours.  BNP: BNP (last 3 results) Recent Labs    07/06/17 1900  BNP 4,340.1*    ProBNP (last 3 results) No results for input(s): PROBNP in the last 8760 hours.   D-Dimer No results for input(s): DDIMER in the last 72 hours. Hemoglobin A1C No results for input(s): HGBA1C in the last 72 hours. Fasting Lipid Panel No results for input(s): CHOL, HDL, LDLCALC, TRIG, CHOLHDL, LDLDIRECT in the last 72 hours. Thyroid Function Tests No results for input(s): TSH, T4TOTAL, T3FREE, THYROIDAB in the last 72 hours.  Invalid input(s): FREET3  Other results:   Imaging    No results found.   Medications:     Scheduled Medications: . allopurinol  100 mg Oral Daily  . amiodarone  200 mg Oral Daily  . furosemide  80 mg  Intravenous Once  . hydrALAZINE  10 mg Oral Q8H  . isosorbide mononitrate  30 mg Oral Daily  . levothyroxine  75 mcg Oral QAC breakfast  . metolazone  2.5 mg Oral Once  . metoprolol succinate  25 mg Oral Daily  . potassium chloride  40 mEq Oral Once  . sodium chloride flush  3 mL Intravenous Q12H  . Warfarin - Pharmacist Dosing Inpatient   Does not apply q1800    Infusions: . sodium chloride      PRN Medications: sodium chloride, acetaminophen, ondansetron (ZOFRAN) IV, sodium chloride flush, sodium chloride flush    Assessment/Plan   1. Acute on chronic systolic CHF: Long-standing NICM, 5/05 cath without significant CAD.  Echo in 5/17 with EF 20%, echo this admission with EF 10-15%, prominent  dyssynchrony, moderate-severe MR, PASP 91 (but ?if this represents VSD flow rather than TR).  Complicated by CKD stage 3-4.  Has MDT CRT-D device but wide QRS 188 msec. She is breathing better but still with dyspnea.  Still volume overloaded on exam but creatinine up to 2.5. Good co-ox 80%.  - Will give Lasix 80 mg IV x 1 with metolazone 2.5 x 1 this morning then will stop aggressive diuresis with rise in renal function.  If renal function stable tomorrow, will start on torsemide po.  - Continue Toprol XL 25 mg daily.  - No ACEI/ARB/ARNI: has failed in the past with AKI.  - Continue hydralazine 10 mg tid + Imdur 30 daily, no BP room to increase.   - No BP room to add spironolactone, may be able to in future.  - Given dyssynchrony by echo and wide QRS, will have EP evaluate to see if BiV pacing can be adjusted.  2. Atrial fibrillation: Paroxysmal. Maintaining NSR.  - Continue amiodarone.  - Continue warfarin per pharmacy.  3. CKD: Stage 4. Creatinine up to 2.5, stop IV diuresis today after am Lasix/metolazone.  4. H/o VSD: likely restrictive/small.  ?If elevated PA pressure by echo actually represents VSD jet rather than TR jet.  Will need to review.  5. Mitral regurgitation: Moderate-severe central MR.  Functional.  May be candidate for MV clip in future.   Loralie Champagne 07/10/2017 8:48 AM

## 2017-07-10 NOTE — Progress Notes (Signed)
[id number] Pt stated she had walked earlier with [employer] staff and is going to walk again this afternoon. Gave pt CHF booklet and discussed when to call MD. Encouraged daily weights and watching sodium. Gave low sodium handouts and 2000 mg restriction. Pt stated she does not keep weight chart but knows when she starts to gain fluid. Stated that she is seen frequently by Dr Wynonia Lawman and has a good relationship with him and lets him know when she feels that she is gaining fluid. Since [employer] staff can walk pt and has been followed by PT, we will sign off. Pt encouraged to read CHF booklet.  Graylon Good RN BSN 07/10/2017 12:00 PM

## 2017-07-10 NOTE — Progress Notes (Signed)
Pt slept well overnight, vitals stable, diuresing very well, denies CP, SOB and distress, will continue to monitor the patient.  Palma Holter, RN

## 2017-07-10 NOTE — Consult Note (Signed)
   [facility] Inpatient Consult   07/10/2017  Casey [name] 1934-06-28 [id number]   Patient was screened for HF exacerbation in the Medicare ACO.  EF 15-20%. Met with the patient at the bedside regarding [place] Management's role for post hospital needs.  Patient states, "I am not at the point in my life where I need someone fumbling around my house.  I can manage."  Explained how [place] Management can assist in post hospital education and ensuring transition back home and to appointments can help.  Patient states, "you can leave the information, right now, I feel like I good once I get this fluid off."  Brochure with 24 hour nurse advise line and contact information was given.  Patient endorses Dr. Elby Showers as her primary care provider. No needs with medications or transportation.  For questions, please contact:  Natividad Brood, RN BSN [facility] Liaison  [phone] business mobile phone Toll free office [phone]

## 2017-07-10 NOTE — Care Management Important Message (Signed)
  Important Message  Patient Details  Name: Casey [name] MRN: [id number] Date of Birth: 1934/10/25   Medicare Important Message Given:  Yes    Orbie Pyo 07/10/2017, 12:13 PM

## 2017-07-11 LAB — CBC WITH DIFFERENTIAL/PLATELET
BASOS ABS: 0 10*3/uL (ref 0.0–0.1)
Basophils Relative: 1 %
Eosinophils Absolute: 0.1 10*3/uL (ref 0.0–0.7)
Eosinophils Relative: 3 %
HEMATOCRIT: 46.1 % — AB (ref 36.0–46.0)
Hemoglobin: 15.3 g/dL — ABNORMAL HIGH (ref 12.0–15.0)
LYMPHS ABS: 1.3 10*3/uL (ref 0.7–4.0)
LYMPHS PCT: 25 %
MCH: 35 pg — ABNORMAL HIGH (ref 26.0–34.0)
MCHC: 33.2 g/dL (ref 30.0–36.0)
MCV: 105.5 fL — AB (ref 78.0–100.0)
Monocytes Absolute: 0.8 10*3/uL (ref 0.1–1.0)
Monocytes Relative: 15 %
NEUTROS ABS: 3 10*3/uL (ref 1.7–7.7)
Neutrophils Relative %: 56 %
Platelets: 127 10*3/uL — ABNORMAL LOW (ref 150–400)
RBC: 4.37 MIL/uL (ref 3.87–5.11)
RDW: 13.8 % (ref 11.5–15.5)
WBC: 5.3 10*3/uL (ref 4.0–10.5)

## 2017-07-11 LAB — BASIC METABOLIC PANEL
ANION GAP: 13 (ref 5–15)
BUN: 58 mg/dL — ABNORMAL HIGH (ref 6–20)
CALCIUM: 9.3 mg/dL (ref 8.9–10.3)
CO2: 28 mmol/L (ref 22–32)
Chloride: 96 mmol/L — ABNORMAL LOW (ref 101–111)
Creatinine, Ser: 2.49 mg/dL — ABNORMAL HIGH (ref 0.44–1.00)
GFR calc Af Amer: 20 mL/min — ABNORMAL LOW (ref >60)
GFR, EST NON AFRICAN AMERICAN: 17 mL/min — AB (ref >60)
Glucose, Bld: 107 mg/dL — ABNORMAL HIGH (ref 65–99)
Potassium: 3.9 mmol/L (ref 3.5–5.1)
SODIUM: 137 mmol/L (ref 135–145)

## 2017-07-11 LAB — PROTIME-INR
INR: 1.79
Prothrombin Time: 20.6 seconds — ABNORMAL HIGH (ref 11.4–15.2)

## 2017-07-11 LAB — COOXEMETRY PANEL
CARBOXYHEMOGLOBIN: 1.8 % — AB (ref 0.5–1.5)
METHEMOGLOBIN: 0.8 % (ref 0.0–1.5)
O2 Saturation: 44.3 %
Total hemoglobin: 15.6 g/dL (ref 12.0–16.0)

## 2017-07-11 MED ORDER — ISOSORBIDE MONONITRATE ER 30 MG PO TB24: 30.0000 mg | ORAL_TABLET | Freq: Every day | ORAL | 6 refills | Status: DC

## 2017-07-11 MED ORDER — HYDRALAZINE HCL 10 MG PO TABS: 10.0000 mg | ORAL_TABLET | Freq: Three times a day (TID) | ORAL | 6 refills | Status: DC

## 2017-07-11 MED ORDER — TORSEMIDE 20 MG PO TABS
60.0000 mg | ORAL_TABLET | Freq: Every day | ORAL | Status: DC
  Administered 2017-07-11: 60 mg via ORAL
  Filled 2017-07-11: qty 3

## 2017-07-11 MED ORDER — METOPROLOL SUCCINATE ER 25 MG PO TB24: 25.0000 mg | ORAL_TABLET | Freq: Every day | ORAL | 6 refills | Status: DC

## 2017-07-11 MED ORDER — POTASSIUM CHLORIDE CRYS ER 20 MEQ PO TBCR
20.0000 meq | EXTENDED_RELEASE_TABLET | Freq: Every day | ORAL | Status: DC
  Administered 2017-07-11: 20 meq via ORAL
  Filled 2017-07-11: qty 1

## 2017-07-11 MED ORDER — WARFARIN SODIUM 2 MG PO TABS: 2.0000 mg | ORAL_TABLET | Freq: Every day | ORAL | 6 refills | Status: DC

## 2017-07-11 MED ORDER — WARFARIN SODIUM 5 MG PO TABS
5.0000 mg | ORAL_TABLET | Freq: Once | ORAL | Status: AC
  Administered 2017-07-11: 5 mg via ORAL
  Filled 2017-07-11: qty 1

## 2017-07-11 MED ORDER — TORSEMIDE 20 MG PO TABS: 60.0000 mg | ORAL_TABLET | Freq: Every day | ORAL | 6 refills | Status: DC

## 2017-07-11 NOTE — Progress Notes (Signed)
Patient ID: Casey [name], female   DOB: [birth year], 83 y.o.   MRN: [id number]     Advanced Heart Failure Rounding Note  Primary Cardiologist: Wynonia Lawman HF Cardiology: Aundra Dubin  Subjective:    She diuresed well again yesterday, weight down 2 lbs (about 7-8 lbs total).  SBP 90s-100s.  Breathing much improved now, denies dyspnea in hall.  Creatinine 2.49 which is fairly stable.   Echo: EF 10-15% with prominent dyssynchrony, moderate LV dilation, moderate-severe MR, PASP 91 mmHg (?if this is from VSD).   Objective:   Weight Range: 155 lb 1.6 oz (70.4 kg) Body mass index is 28.37 kg/m.   Vital Signs:   Temp:  [97.4 F (36.3 C)-97.7 F (36.5 C)] 97.4 F (36.3 C) (01/30 0520) Pulse Rate:  [63-70] 70 (01/30 0520) Resp:  [18] 18 (01/30 0520) BP: (85-105)/(44-59) 105/56 (01/30 0520) SpO2:  [92 %-98 %] 98 % (01/30 0520) Weight:  [155 lb 1.6 oz (70.4 kg)] 155 lb 1.6 oz (70.4 kg) (01/30 0520) Last BM Date: 07/07/17  Weight change: Filed Weights   07/09/17 0442 07/10/17 0548 07/11/17 0520  Weight: 162 lb 9.6 oz (73.8 kg) 157 lb 4.8 oz (71.4 kg) 155 lb 1.6 oz (70.4 kg)    Intake/Output:   Intake/Output Summary (Last 24 hours) at 07/11/2017 0852 Last data filed at 07/11/2017 0524 Gross per 24 hour  Intake 260 ml  Output 2300 ml  Net -2040 ml      Physical Exam    General: NAD Neck: JVP 8 cm, no thyromegaly or thyroid nodule.  Lungs: Clear to auscultation bilaterally with normal respiratory effort. CV: Nondisplaced PMI.  Heart regular S1/S2, no S3/S4, 2/6 HSM apex.  No peripheral edema.   Abdomen: Soft, nontender, no hepatosplenomegaly, no distention.  Skin: Intact without lesions or rashes.  Neurologic: Alert and oriented x 3.  Psych: Normal affect. Extremities: No clubbing or cyanosis.  HEENT: Normal.    Telemetry   NSR biv paced 80s personally reviewed.   Labs    CBC Recent Labs    07/10/17 0411 07/11/17 0421  WBC 5.2 5.3  NEUTROABS 3.2 3.0  HGB 14.9  15.3*  HCT 44.4 46.1*  MCV 106.2* 105.5*  PLT 125* 518*   Basic Metabolic Panel Recent Labs    07/10/17 0411 07/11/17 0421  NA 139 137  K 3.7 3.9  CL 99* 96*  CO2 29 28  GLUCOSE 116* 107*  BUN 53* 58*  CREATININE 2.57* 2.49*  CALCIUM 9.5 9.3   Liver Function Tests No results for input(s): AST, ALT, ALKPHOS, BILITOT, PROT, ALBUMIN in the last 72 hours. No results for input(s): LIPASE, AMYLASE in the last 72 hours. Cardiac Enzymes No results for input(s): CKTOTAL, CKMB, CKMBINDEX, TROPONINI in the last 72 hours.  BNP: BNP (last 3 results) Recent Labs    07/06/17 1900  BNP 4,340.1*    ProBNP (last 3 results) No results for input(s): PROBNP in the last 8760 hours.   D-Dimer No results for input(s): DDIMER in the last 72 hours. Hemoglobin A1C No results for input(s): HGBA1C in the last 72 hours. Fasting Lipid Panel No results for input(s): CHOL, HDL, LDLCALC, TRIG, CHOLHDL, LDLDIRECT in the last 72 hours. Thyroid Function Tests No results for input(s): TSH, T4TOTAL, T3FREE, THYROIDAB in the last 72 hours.  Invalid input(s): FREET3  Other results:   Imaging    No results found.   Medications:     Scheduled Medications: . allopurinol  100 mg Oral Daily  . amiodarone  200 mg Oral Daily  . hydrALAZINE  10 mg Oral Q8H  . isosorbide mononitrate  30 mg Oral Daily  . levothyroxine  75 mcg Oral QAC breakfast  . metoprolol succinate  25 mg Oral Daily  . potassium chloride  20 mEq Oral Daily  . sodium chloride flush  3 mL Intravenous Q12H  . torsemide  60 mg Oral Daily  . Warfarin - Pharmacist Dosing Inpatient   Does not apply q1800    Infusions: . sodium chloride      PRN Medications: sodium chloride, acetaminophen, ondansetron (ZOFRAN) IV, sodium chloride flush, sodium chloride flush    Assessment/Plan   1. Acute on chronic systolic CHF: Long-standing NICM, 5/05 cath without significant CAD.  Echo in 5/17 with EF 20%, echo this admission with EF  10-15%, prominent dyssynchrony, moderate-severe MR, PASP 91 (but ?if this represents VSD flow rather than TR).  Complicated by CKD stage 3-4.  Has MDT CRT-D device but wide QRS 188 msec. Breathing much improved, creatinine fairly stable at 2.49.  Weight down another 2 lbs.  Volume status looks better on exam.  - Transition to torsemide 60 mg daily + KCl 20 mEq daily at home (had been chronically on Lasix 80 mg po bid).  May need to increase, so will follow closely as outpatient.   - Continue Toprol XL 25 mg daily.  - No ACEI/ARB/ARNI: has failed in the past with AKI.  - Continue hydralazine 10 mg tid + Imdur 30 daily, no BP room to increase.   - No BP room to add spironolactone, may be able to in future if creatinine remains stable.  - Given dyssynchrony by echo and wide QRS, will have EP evaluate to see if BiV pacing can be adjusted => EP plans V-V optimization this morning before she goes home.  2. Atrial fibrillation: Paroxysmal. Maintaining NSR.  - Continue amiodarone.  - Continue warfarin per pharmacy.  3. CKD: Stage 4. Creatinine fairly stable at 2.49.  4. H/o VSD: likely restrictive/small.  ?If elevated PA pressure by echo represents VSD jet rather than TR jet => this may actually be reflective of VSD jet velocity. 5. Mitral regurgitation: Moderate-severe central MR.  Functional.  May be candidate for MV clip in future.  6. I think she can go home today.  She will need close followup, will be seen by Dr. Wynonia Lawman and in [place] clinic in about a week or so.  Cardiac meds for home: Torsemide 60 mg daily, KCl 20 daily, hydralazine 10 mg tid, Imdur 30 daily, Toprol XL 25 daily, warfarin, amiodarone 200 daily.   Loralie Champagne 07/11/2017 8:52 AM

## 2017-07-11 NOTE — Progress Notes (Signed)
Casey [name] for warfarin Indication: atrial fibrillation  Allergies  Allergen Reactions  . Codeine     Unknown    Patient Measurements: Height: 5\' 2"  (157.5 cm) Weight: 155 lb 1.6 oz (70.4 kg)(scale c) IBW/kg (Calculated) : 50.1   Vital Signs: Temp: 97.4 F (36.3 C) (01/30 0520) Temp Source: Oral (01/30 0520) BP: 105/56 (01/30 0520) Pulse Rate: 70 (01/30 0520)  Labs: Recent Labs    07/09/17 0332 07/10/17 0411 07/11/17 0421  HGB 14.1 14.9 15.3*  HCT 44.3 44.4 46.1*  PLT 137* 125* 127*  LABPROT 22.3* 22.2* 20.6*  INR 1.97 1.96 1.79  CREATININE 2.44* 2.57* 2.49*    Estimated Creatinine Clearance: 16.6 mL/min (A) (by C-G formula based on SCr of 2.49 mg/dL (H)).   Assessment: 83 year old female admitted 1/25 with severe shortness of breath and noted with HF. Patient has a history of atrial fibrillation and takes warfarin at home. INR on admission was 2.17.  INR is 1.79 today from 1.96, after another increased dose of 3 mg last night. CBC stable. No signs/symptoms of bleeding. No new medication interactions. She reports following with Dr Wynonia Lawman as outpatient for warfarin INR checks.   PTA warfarin dose: 2 mg daily with last dose 1/24 at 1900.  Goal of Therapy:  INR 2-3 Monitor platelets by anticoagulation protocol: Yes   Plan:  Will order one time dose of 5 mg tonight given subtherapeutic INR -If discharged, would resume home dose of 2 mg tomorrow and follow up with INR clinic later this week or early next week  Daily INR and CBC  Doylene Canard, PharmD Clinical Pharmacist  Pager: [phone] Clinical Phone for 07/11/2017 until 3:30pm: [phone] If after 3:30pm, please call main pharmacy at [phone]  07/11/2017 10:09 AM

## 2017-07-11 NOTE — Discharge Summary (Signed)
Advanced Heart Failure Discharge Note  Discharge Summary   Patient ID: Casey [name] MRN: [id number], DOB/AGE: 81-Jun-1936 81 y.o. Admit date: 07/06/2017 D/C date:     07/11/2017   Primary Discharge Diagnoses:  1. Acute on chronic systolic heart failure 2. Paroxysmal Atrial fibrillation 3. CKD, stage 4 4. History of VSD 5. Mitral regurgitation  Hospital Course:  Casey [name] is a 83 y.o. female  long history of nonischemic cardiomyopathy, now CKD stage 3-4.  She had cath in 5/05 with normal coronaries.  Has MDT CRT-D device.  Not on ACEI/ARB/ARNI due to renal dysfunction (tried all in the past).  On warfarin with history of PAF.  Last echo in 5/17 with EF 20%, moderate MR, moderate TR, small VSD.   Admitted 1/25 from Dr Ezekiel Slocumb office with increased SOB and decreased urine output. Prior to admit home diuretic regimen was increased with poor response.   Acute on Chronic Systolic Heart Failure: Admitted 07/06/17 from office with increased SOB and decreased urine output unresponsive to PO lasix. Echo 07/07/17 EF 10-15% with grade 2 DD, prominent dyssynchrony, moderate-severe MR, PASP 91 (but ?if this represents VSD flow rather than TR). She diuresed with IV lasix + metolazone and was transitioned to torsemide 60 mg daily. Overall diuresed 7 lbs. Optimized HF meds. No ACE/ARB with elevated creatinine. Consider spiro in future if BP and creatinine remain stable. Has medtronic CRT-D with wide QRS. EP completed V-V optimization.  Paroxysmal Atrial Fibrillation Continued on warfarin and amiodarone 200 mg daily. Maintained NSR throughout admission. Received a dose of warfarin prior to DC (INR 1.79 this morning) and let her resume normal dosing tomorrow. Dr Wynonia Lawman manages her warfarin. Appointment made for INR check on Friday 9:30.  CKD, stage 3-4 Baseline 2.0. Creatinine 2.49 on day of discharge.   History of VSD Likely restrictive/small. ?If elevated PA pressure by echo represents  VSD jet rather than TR jet => this may actually be reflective of VSD jet velocity.  Mitral Regurgitation Moderate-severe central MR. Functional. May be candidate for MV clip in future.  Deconditioning Orders for [facility] PT, OT, RN and aide placed.  She will continue to be followed closely in HF clinic. HF follow up next week.  Discharge Weight Range: 155 lbs.  Discharge Vitals: Blood pressure (!) 105/56, pulse 70, temperature (!) 97.4 F (36.3 C), temperature source Oral, resp. rate 18, height 5\' 2"  (1.575 m), weight 155 lb 1.6 oz (70.4 kg), SpO2 98 %.  Labs: Lab Results  Component Value Date   WBC 5.3 07/11/2017   HGB 15.3 (H) 07/11/2017   HCT 46.1 (H) 07/11/2017   MCV 105.5 (H) 07/11/2017   PLT 127 (L) 07/11/2017    Recent Labs  Lab 07/06/17 1900  07/11/17 0421  NA 147*   < > 137  K 3.6   < > 3.9  CL 111   < > 96*  CO2 23   < > 28  BUN 32*   < > 58*  CREATININE 2.10*   < > 2.49*  CALCIUM 9.3   < > 9.3  PROT 6.1*  --   --   BILITOT 2.2*  --   --   ALKPHOS 122  --   --   ALT 23  --   --   AST 28  --   --   GLUCOSE 130*   < > 107*   < > = values in this interval not displayed.   Lab Results  Component Value Date  CHOL 235 (H) 01/26/2017   HDL 35 (L) 01/26/2017   LDLCALC 164 (H) 01/26/2017   TRIG 179 (H) 01/26/2017   BNP (last 3 results) Recent Labs    07/06/17 1900  BNP 4,340.1*    ProBNP (last 3 results) No results for input(s): PROBNP in the last 8760 hours.   Diagnostic Studies/Procedures   No results found.  Discharge Medications   Allergies as of 07/11/2017      Reactions   Codeine    Unknown      Medication List    STOP taking these medications   furosemide 40 MG tablet Commonly known as:  LASIX   lisinopril 20 MG tablet Commonly known as:  PRINIVIL,ZESTRIL   meclizine 25 MG tablet Commonly known as:  ANTIVERT     TAKE these medications   allopurinol 100 MG tablet Commonly known as:  ZYLOPRIM TAKE 1 TABLET BY MOUTH EVERY  DAY What changed:    how much to take  how to take this  when to take this   amiodarone 200 MG tablet Commonly known as:  PACERONE Take 200 mg by mouth daily.   brinzolamide 1 % ophthalmic suspension Commonly known as:  AZOPT Place 1 drop into both eyes 3 (three) times daily.   hydrALAZINE 10 MG tablet Commonly known as:  APRESOLINE Take 1 tablet (10 mg total) by mouth 3 (three) times daily.   isosorbide mononitrate 30 MG 24 hr tablet Commonly known as:  IMDUR Take 1 tablet (30 mg total) by mouth daily. Start taking on:  07/12/2017   latanoprost 0.005 % ophthalmic solution Commonly known as:  XALATAN Place 1 drop into both eyes at bedtime.   levothyroxine 75 MCG tablet Commonly known as:  SYNTHROID, LEVOTHROID Take 75 mcg by mouth daily before breakfast.   metoprolol succinate 25 MG 24 hr tablet Commonly known as:  TOPROL-XL Take 1 tablet (25 mg total) by mouth daily. Start taking on:  07/12/2017 What changed:    medication strength  how much to take  additional instructions   ONE-A-DAY WOMENS FORMULA Tabs Take 1 tablet by mouth daily.   potassium chloride SA 20 MEQ tablet Commonly known as:  K-DUR,KLOR-CON Take 20 mEq by mouth daily.   torsemide 20 MG tablet Commonly known as:  DEMADEX Take 3 tablets (60 mg total) by mouth daily.   vitamin D (CHOLECALCIFEROL) 400 units tablet Take 800 Units by mouth daily.   warfarin 2 MG tablet Commonly known as:  COUMADIN Take 1 tablet (2 mg total) by mouth daily. Start taking on:  07/12/2017       Disposition   The patient will be discharged in stable condition to home. Discharge Instructions    (HEART FAILURE PATIENTS) Call MD:  Anytime you have any of the following symptoms: 1) 3 pound weight gain in 24 hours or 5 pounds in 1 week 2) shortness of breath, with or without a dry hacking cough 3) swelling in the hands, feet or stomach 4) if you have to sleep on extra pillows at night in order to breathe.    Complete by:  As directed    Diet - low sodium heart healthy   Complete by:  As directed    Discharge instructions   Complete by:  As directed    We will give you a dose of coumadin (your blood thinner) before you go home today. Please resume your normal coumadin dose of 2 mg tomorrow (Thursday 07/12/2017). You have an appointment for INR  check on Friday at 9:30 at Dr. Thurman Coyer office.   Heart Failure patients record your daily weight using the same scale at the same time of day   Complete by:  As directed    Increase activity slowly   Complete by:  As directed    STOP any activity that causes chest pain, shortness of breath, dizziness, sweating, or exessive weakness   Complete by:  As directed      Follow-up Information    Elby Showers, MD On 07/20/2017.   Specialty:  Internal Medicine Why:  At 12:00 PM. Contact information: [address] [place] [id number] [place]. Go on 07/17/2017.   Specialty:  Cardiology Why:  10:30 AM, Advanced Heart Failure Clinic, parking code [place] information: [address] [id number] [place] [phone]            Duration of Discharge Encounter: Greater than 35 minutes   Signed, Georgiana Shore, NP 07/11/2017, 1:37 PM

## 2017-07-11 NOTE — Progress Notes (Signed)
PT Cancellation Note  Patient Details Name: Casey [name] MRN: [id number] DOB: 17-Mar-1935   Cancelled Treatment:    Reason Eval/Treat Not Completed: (P) Patient declined, no reason specified(Pt reports she is ready to d/c and the only interest she has in walking is out the front door.  )   Cristela Blue 07/11/2017, 1:16 PM  Governor Rooks, PTA pager [phone]

## 2017-07-11 NOTE — Progress Notes (Signed)
Pt lying in bed informed pt she is being discharged today. Awaiting IV team to remove picc line and transportation.

## 2017-07-11 NOTE — Discharge Instructions (Signed)
Information on my medicine - Coumadin   (Warfarin)  This medication education was reviewed with me or my healthcare representative as part of my discharge preparation.  The pharmacist that spoke with me during my hospital stay was:  Betsey Holiday, [facility]  Why was Coumadin prescribed for you? Coumadin was prescribed for you because you have a blood clot or a medical condition that can cause an increased risk of forming blood clots. Blood clots can cause serious health problems by blocking the flow of blood to the heart, lung, or brain. Coumadin can prevent harmful blood clots from forming. As a reminder your indication for Coumadin is:   Stroke Prevention Because Of Atrial Fibrillation  What test will check on my response to Coumadin? While on Coumadin (warfarin) you will need to have an INR test regularly to ensure that your dose is keeping you in the desired range. The INR (international normalized ratio) number is calculated from the result of the laboratory test called prothrombin time (PT).  If an INR APPOINTMENT HAS NOT ALREADY BEEN MADE FOR YOU please schedule an appointment to have this lab work done by your health care provider within 7 days. Your INR goal is usually a number between:  2 to 3 or your provider may give you a more narrow range like 2-2.5.  Ask your health care provider during an office visit what your goal INR is.  What  do you need to  know  About  COUMADIN? Take Coumadin (warfarin) exactly as prescribed by your healthcare provider about the same time each day.  DO NOT stop taking without talking to the doctor who prescribed the medication.  Stopping without other blood clot prevention medication to take the place of Coumadin may increase your risk of developing a new clot or stroke.  Get refills before you run out.  What do you do if you miss a dose? If you miss a dose, take it as soon as you remember on the same day then continue your regularly scheduled regimen the  next day.  Do not take two doses of Coumadin at the same time.  Important Safety Information A possible side effect of Coumadin (Warfarin) is an increased risk of bleeding. You should call your healthcare provider right away if you experience any of the following: ? Bleeding from an injury or your nose that does not stop. ? Unusual colored urine (red or dark brown) or unusual colored stools (red or black). ? Unusual bruising for unknown reasons. ? A serious fall or if you hit your head (even if there is no bleeding).  Some foods or medicines interact with Coumadin (warfarin) and might alter your response to warfarin. To help avoid this: ? Eat a balanced diet, maintaining a consistent amount of Vitamin K. ? Notify your provider about major diet changes you plan to make. ? Avoid alcohol or limit your intake to 1 drink for women and 2 drinks for men per day. (1 drink is 5 oz. wine, 12 oz. beer, or 1.5 oz. liquor.)  Make sure that ANY health care provider who prescribes medication for you knows that you are taking Coumadin (warfarin).  Also make sure the healthcare provider who is monitoring your Coumadin knows when you have started a new medication including herbals and non-prescription products.  Coumadin (Warfarin)  Major Drug Interactions  Increased Warfarin Effect Decreased Warfarin Effect  Alcohol (large quantities) Antibiotics (esp. Septra/Bactrim, Flagyl, Cipro) Amiodarone (Cordarone) Aspirin (ASA) Cimetidine (Tagamet) Megestrol (Megace) NSAIDs (ibuprofen,  naproxen, etc.) °Piroxicam (Feldene) °Propafenone (Rythmol SR) °Propranolol (Inderal) °Isoniazid (INH) °Posaconazole (Noxafil) Barbiturates (Phenobarbital) °Carbamazepine (Tegretol) °Chlordiazepoxide (Librium) °Cholestyramine (Questran) °Griseofulvin °Oral Contraceptives °Rifampin °Sucralfate (Carafate) °Vitamin K  ° °Coumadin® (Warfarin) Major Herbal Interactions  °Increased Warfarin Effect Decreased Warfarin Effect   °Garlic °Ginseng °Ginkgo biloba Coenzyme Q10 °Green tea °St. John’s wort   ° °Coumadin® (Warfarin) FOOD Interactions  °Eat a consistent number of servings per week of foods HIGH in Vitamin K °(1 serving = ½ cup)  °Collards (cooked, or boiled & drained) °Kale (cooked, or boiled & drained) °Mustard greens (cooked, or boiled & drained) °Parsley *serving size only = ¼ cup °Spinach (cooked, or boiled & drained) °Swiss chard (cooked, or boiled & drained) °Turnip greens (cooked, or boiled & drained)  °Eat a consistent number of servings per week of foods MEDIUM-HIGH in Vitamin K °(1 serving = 1 cup)  °Asparagus (cooked, or boiled & drained) °Broccoli (cooked, boiled & drained, or raw & chopped) °Brussel sprouts (cooked, or boiled & drained) *serving size only = ½ cup °Lettuce, raw (green leaf, endive, romaine) °Spinach, raw °Turnip greens, raw & chopped  ° °These websites have more information on Coumadin (warfarin):  www.coumadin.com; °www.ahrq.gov/consumer/coumadin.htm; ° ° ° °

## 2017-07-11 NOTE — Progress Notes (Addendum)
Patient needs [place] services at discharge; referral made to [facility] with [facility] for the [place]. He will see the patient soon to see if she will qualify. Mindi Slicker [facility] [phone]  2:05 pm- Pt/ daughter is agreeable to the [facility], all questions answered by Georgina Snell with [place]. Mindi Slicker [facility] [phone]

## 2017-07-12 DIAGNOSIS — I5023 Acute on chronic systolic (congestive) heart failure: Secondary | ICD-10-CM | POA: Diagnosis not present

## 2017-07-12 DIAGNOSIS — I130 Hypertensive heart and chronic kidney disease with heart failure and stage 1 through stage 4 chronic kidney disease, or unspecified chronic kidney disease: Secondary | ICD-10-CM | POA: Diagnosis not present

## 2017-07-13 DIAGNOSIS — Q210 Ventricular septal defect: Secondary | ICD-10-CM | POA: Diagnosis not present

## 2017-07-13 DIAGNOSIS — I428 Other cardiomyopathies: Secondary | ICD-10-CM | POA: Diagnosis not present

## 2017-07-13 DIAGNOSIS — I700 Atherosclerosis of aorta: Secondary | ICD-10-CM | POA: Diagnosis not present

## 2017-07-13 DIAGNOSIS — I472 Ventricular tachycardia: Secondary | ICD-10-CM | POA: Diagnosis not present

## 2017-07-13 DIAGNOSIS — I5022 Chronic systolic (congestive) heart failure: Secondary | ICD-10-CM | POA: Diagnosis not present

## 2017-07-13 DIAGNOSIS — Z7901 Long term (current) use of anticoagulants: Secondary | ICD-10-CM | POA: Diagnosis not present

## 2017-07-13 DIAGNOSIS — I130 Hypertensive heart and chronic kidney disease with heart failure and stage 1 through stage 4 chronic kidney disease, or unspecified chronic kidney disease: Secondary | ICD-10-CM | POA: Diagnosis not present

## 2017-07-13 DIAGNOSIS — I5023 Acute on chronic systolic (congestive) heart failure: Secondary | ICD-10-CM | POA: Diagnosis not present

## 2017-07-13 DIAGNOSIS — R0600 Dyspnea, unspecified: Secondary | ICD-10-CM | POA: Diagnosis not present

## 2017-07-13 DIAGNOSIS — R0602 Shortness of breath: Secondary | ICD-10-CM | POA: Diagnosis not present

## 2017-07-13 DIAGNOSIS — E668 Other obesity: Secondary | ICD-10-CM | POA: Diagnosis not present

## 2017-07-13 DIAGNOSIS — E039 Hypothyroidism, unspecified: Secondary | ICD-10-CM | POA: Diagnosis not present

## 2017-07-13 DIAGNOSIS — Z95810 Presence of automatic (implantable) cardiac defibrillator: Secondary | ICD-10-CM | POA: Diagnosis not present

## 2017-07-13 DIAGNOSIS — N183 Chronic kidney disease, stage 3 (moderate): Secondary | ICD-10-CM | POA: Diagnosis not present

## 2017-07-16 ENCOUNTER — Telehealth: Payer: Self-pay

## 2017-07-16 NOTE — Telephone Encounter (Signed)
Ok verbal order was given to [place].

## 2017-07-16 NOTE — Telephone Encounter (Signed)
OK to approve. 

## 2017-07-16 NOTE — Telephone Encounter (Signed)
Katharine Look with [facility] home care called states patient was recently hospitalized for [facility], patient is back home her family lives out of state and Katharine Look is requesting approval for 5 days of skill nursing in home she said PT will visit her too.  Call back number [phone]

## 2017-07-17 ENCOUNTER — Encounter (HOSPITAL_COMMUNITY): Payer: Medicare Other

## 2017-07-17 DIAGNOSIS — I130 Hypertensive heart and chronic kidney disease with heart failure and stage 1 through stage 4 chronic kidney disease, or unspecified chronic kidney disease: Secondary | ICD-10-CM | POA: Diagnosis not present

## 2017-07-17 DIAGNOSIS — I5023 Acute on chronic systolic (congestive) heart failure: Secondary | ICD-10-CM | POA: Diagnosis not present

## 2017-07-17 NOTE — Progress Notes (Deleted)
Electrophysiology Office Note Date: 07/17/2017  ID:  Casey [name], Casey [name] Mar 31, 1935, MRN [id number]  PCP: Elby Showers, MD Primary Cardiologist: Wynonia Lawman Electrophysiologist: Caryl Comes  CC: Routine ICD follow-up  Casey [name] is a 83 y.o. female seen today for Dr Caryl Comes.  She presents today for routine electrophysiology followup. She remains closely followed by Dr Wynonia Lawman. She was recently admitted for HF and underwent device EKG optimization at that time by Dr Caryl Comes. Since discharge, ***.  She denies chest pain, palpitations, dyspnea, PND, orthopnea, nausea, vomiting, dizziness, syncope, edema, weight gain, or early satiety.  She has not had ICD shocks.   Device History: MDT CRTD implanted 2005 for NICM, CHF; RV lead revision 2007; gen change 2009; gen change 2014 History of appropriate therapy: Yes History of AAD therapy: Yes - amiodarone    Past Medical History:  Diagnosis Date  . Atherosclerosis of aorta ([place])   . Biventricular ICD (implantable cardiac defibrillator) in place    medtronic   . Chronic kidney disease (CKD), stage III (moderate) (HCC)   . Chronic systolic heart failure ([place])   . Gout   . History of peptic ulcer disease   . Hypertension   . Hypothyroidism   . Lumbar disc disease   . Nonischemic cardiomyopathy ([place])   . Ventricular septal defect   . VT (ventricular tachycardia) ([place])    appropriate therapy via ICD 2013   Past Surgical History:  Procedure Laterality Date  . ABDOMINAL HYSTERECTOMY    . BIV ICD GENERTAOR CHANGE OUT N/A 04/16/2013   Procedure: BIV ICD GENERTAOR CHANGE OUT;  Surgeon: Deboraha Sprang, MD;  Location: [facility] CATH LAB;  Service: Cardiovascular;  Laterality: N/A;  . CESAREAN SECTION    . FEMUR IM NAIL      Current Outpatient Medications  Medication Sig Dispense Refill  . allopurinol (ZYLOPRIM) 100 MG tablet TAKE 1 TABLET BY MOUTH EVERY DAY (Patient taking differently: TAKE 1 TABLET (100mg ) BY MOUTH EVERY DAY) 30 tablet 11    . amiodarone (PACERONE) 200 MG tablet Take 200 mg by mouth daily.    . brinzolamide (AZOPT) 1 % ophthalmic suspension Place 1 drop into both eyes 3 (three) times daily.    . hydrALAZINE (APRESOLINE) 10 MG tablet Take 1 tablet (10 mg total) by mouth 3 (three) times daily. 90 tablet 6  . isosorbide mononitrate (IMDUR) 30 MG 24 hr tablet Take 1 tablet (30 mg total) by mouth daily. 30 tablet 6  . latanoprost (XALATAN) 0.005 % ophthalmic solution Place 1 drop into both eyes at bedtime.    Marland Kitchen levothyroxine (SYNTHROID, LEVOTHROID) 75 MCG tablet Take 75 mcg by mouth daily before breakfast.     . metoprolol succinate (TOPROL-XL) 25 MG 24 hr tablet Take 1 tablet (25 mg total) by mouth daily. 30 tablet 6  . Multiple Vitamins-Calcium (ONE-A-DAY WOMENS FORMULA) TABS Take 1 tablet by mouth daily.      . potassium chloride SA (K-DUR,KLOR-CON) 20 MEQ tablet Take 20 mEq by mouth daily.     Marland Kitchen torsemide (DEMADEX) 20 MG tablet Take 3 tablets (60 mg total) by mouth daily. 90 tablet 6  . vitamin D, CHOLECALCIFEROL, 400 UNITS tablet Take 800 Units by mouth daily.    Marland Kitchen warfarin (COUMADIN) 2 MG tablet Take 1 tablet (2 mg total) by mouth daily. 30 tablet 6   No current facility-administered medications for this visit.     Allergies:   Codeine   Social History: Social History   Socioeconomic History  .  Marital status: Married    Spouse name: Not on file  . Number of children: Not on file  . Years of education: Not on file  . Highest education level: Not on file  Social Needs  . Financial resource strain: Not on file  . Food insecurity - worry: Not on file  . Food insecurity - inability: Not on file  . Transportation needs - medical: Not on file  . Transportation needs - non-medical: Not on file  Occupational History  . Not on file  Tobacco Use  . Smoking status: Never Smoker  . Smokeless tobacco: Never Used  Substance and Sexual Activity  . Alcohol use: No  . Drug use: No  . Sexual activity: Not on  file  Other Topics Concern  . Not on file  Social History Narrative  . Not on file    Family History: Family History  Problem Relation Age of Onset  . Heart failure Mother   . Cancer Father     Review of Systems: All other systems reviewed and are otherwise negative except as noted above.   Physical Exam: VS:  There were no vitals taken for this visit. , BMI There is no height or weight on file to calculate BMI.  GEN- The patient is well appearing, alert and oriented x 3 today.   HEENT: normocephalic, atraumatic; sclera clear, conjunctiva pink; hearing intact; oropharynx clear; neck supple  Lungs- Clear to ausculation bilaterally, normal work of breathing.  No wheezes, rales, rhonchi Heart- Regular rate and rhythm (paced) GI- soft, non-tender, non-distended, bowel sounds present  Extremities- no clubbing, cyanosis, or edema; DP/PT/radial pulses 2+ bilaterally MS- no significant deformity or atrophy Skin- warm and dry, no rash or lesion; ICD pocket well healed Psych- euthymic mood, full affect Neuro- strength and sensation are intact  ICD interrogation- reviewed in detail today,  See PACEART report  EKG:  EKG is ordered today. EKG today shows ***  Recent Labs: 07/06/2017: ALT 23; B Natriuretic Peptide 4,340.1; TSH 5.959 07/11/2017: BUN 58; Creatinine, Ser 2.49; Hemoglobin 15.3; Platelets 127; Potassium 3.9; Sodium 137   Wt Readings from Last 3 Encounters:  07/11/17 155 lb 1.6 oz (70.4 kg)  01/26/17 158 lb (71.7 kg)  12/08/16 168 lb (76.2 kg)     Other studies Reviewed: Additional studies/ records that were reviewed today include: Dr Olin Pia office notes  Assessment and Plan:  1.  Chronic systolic dysfunction euvolemic today Stable on an appropriate medical regimen Normal ICD function See Pace Art report No changes today Carelink followed by Dr Thurman Coyer office  2.  Ventricular tachycardia No recent recurrence Continue amiodarone LFT's, TSH followed by Dr  Wynonia Lawman. Annual eye exams recommended  3.  HTN Stable No change required today    Current medicines are reviewed at length with the patient today.   The patient does not have concerns regarding her medicines.  The following changes were made today:  none  Labs/ tests ordered today include: none No orders of the defined types were placed in this encounter.    Disposition:   Follow up with Dr Caryl Comes in 1 year. Dr Wynonia Lawman as scheduled     Signed, Chanetta Marshall, NP 07/17/2017 8:02 AM  [address] [place] [address] [phone] (office) [phone] (fax)

## 2017-07-18 ENCOUNTER — Encounter: Payer: Medicare Other | Admitting: Nurse Practitioner

## 2017-07-18 ENCOUNTER — Encounter: Payer: Self-pay | Admitting: Internal Medicine

## 2017-07-18 DIAGNOSIS — H35371 Puckering of macula, right eye: Secondary | ICD-10-CM | POA: Diagnosis not present

## 2017-07-18 DIAGNOSIS — E119 Type 2 diabetes mellitus without complications: Secondary | ICD-10-CM | POA: Diagnosis not present

## 2017-07-18 DIAGNOSIS — H401112 Primary open-angle glaucoma, right eye, moderate stage: Secondary | ICD-10-CM | POA: Diagnosis not present

## 2017-07-18 DIAGNOSIS — H401121 Primary open-angle glaucoma, left eye, mild stage: Secondary | ICD-10-CM | POA: Diagnosis not present

## 2017-07-18 LAB — HM DIABETES EYE EXAM

## 2017-07-20 ENCOUNTER — Ambulatory Visit: Payer: Medicare Other | Admitting: Internal Medicine

## 2017-07-20 DIAGNOSIS — I130 Hypertensive heart and chronic kidney disease with heart failure and stage 1 through stage 4 chronic kidney disease, or unspecified chronic kidney disease: Secondary | ICD-10-CM | POA: Diagnosis not present

## 2017-07-20 DIAGNOSIS — I5023 Acute on chronic systolic (congestive) heart failure: Secondary | ICD-10-CM | POA: Diagnosis not present

## 2017-07-23 ENCOUNTER — Ambulatory Visit (INDEPENDENT_AMBULATORY_CARE_PROVIDER_SITE_OTHER): Payer: Medicare Other | Admitting: Internal Medicine

## 2017-07-23 ENCOUNTER — Encounter: Payer: Self-pay | Admitting: Internal Medicine

## 2017-07-23 VITALS — BP 110/80 | HR 83 | Temp 98.1°F | Wt 156.0 lb

## 2017-07-23 DIAGNOSIS — I509 Heart failure, unspecified: Secondary | ICD-10-CM

## 2017-07-23 DIAGNOSIS — E039 Hypothyroidism, unspecified: Secondary | ICD-10-CM | POA: Diagnosis not present

## 2017-07-23 DIAGNOSIS — I10 Essential (primary) hypertension: Secondary | ICD-10-CM

## 2017-07-23 DIAGNOSIS — Z7901 Long term (current) use of anticoagulants: Secondary | ICD-10-CM

## 2017-07-23 LAB — CBC WITH DIFFERENTIAL/PLATELET
BASOS PCT: 0.8 %
Basophils Absolute: 40 cells/uL (ref 0–200)
EOS ABS: 130 {cells}/uL (ref 15–500)
EOS PCT: 2.6 %
HCT: 45 % (ref 35.0–45.0)
Hemoglobin: 15.3 g/dL (ref 11.7–15.5)
Lymphs Abs: 910 cells/uL (ref 850–3900)
MCH: 34.5 pg — AB (ref 27.0–33.0)
MCHC: 34 g/dL (ref 32.0–36.0)
MCV: 101.4 fL — ABNORMAL HIGH (ref 80.0–100.0)
MONOS PCT: 8.4 %
MPV: 12 fL (ref 7.5–12.5)
NEUTROS ABS: 3500 {cells}/uL (ref 1500–7800)
Neutrophils Relative %: 70 %
PLATELETS: 175 10*3/uL (ref 140–400)
RBC: 4.44 10*6/uL (ref 3.80–5.10)
RDW: 12.7 % (ref 11.0–15.0)
TOTAL LYMPHOCYTE: 18.2 %
WBC mixed population: 420 cells/uL (ref 200–950)
WBC: 5 10*3/uL (ref 3.8–10.8)

## 2017-07-23 LAB — BASIC METABOLIC PANEL
BUN / CREAT RATIO: 20 (calc) (ref 6–22)
BUN: 60 mg/dL — AB (ref 7–25)
CO2: 24 mmol/L (ref 20–32)
Calcium: 9.6 mg/dL (ref 8.6–10.4)
Chloride: 108 mmol/L (ref 98–110)
Creat: 2.94 mg/dL — ABNORMAL HIGH (ref 0.60–0.88)
GLUCOSE: 210 mg/dL — AB (ref 65–99)
Potassium: 4.6 mmol/L (ref 3.5–5.3)
Sodium: 142 mmol/L (ref 135–146)

## 2017-07-23 NOTE — Progress Notes (Signed)
   Subjective:    Patient ID: Casey [name], female    DOB: 08/14/1934, 83 y.o.   MRN: [id number]  HPI 83 year old Female discharged January 30th  after 5 day  stay for acute on chronic CHF. Hx PAF, Hx CKD stage 4, Hx VSD and mitral regurgitation. Diuresed 7 pounds.Discharge weight 155 pounds. Weighs herself every morning. Weighs 156 here in office today.  Patient says she feels much better.  She says her husband remains in the nursing home.  Says social worker is going to try to look for an apartment for them.  Her neighbor is helping her some with meals and light housework.  Dr. Wynonia Lawman is her Cardiologist.  Records indicate she had an echocardiogram in 2017 showing ejection fraction of 20%, moderate MR and moderate TR with small VSD.  More recently echo done July 07, 2017 showed an ejection fraction 10-15% with grade 2 diastolic dysfunction, moderate to severe MR.  She does have a biventricular defibrillator and is on chronic Coumadin therapy.   She is now on amiodarone 200 mg daily, metoprolol and torsemide.  She is also on hydralazine 10 mg 3 times daily.   Review of Systems  Constitutional: Positive for fatigue.  Respiratory: Negative.   Cardiovascular: Negative.        Objective:   Physical Exam Neck is supple.  Chest clear.  Cardiac exam reveals regular rate and rhythm.  Extremities no edema.       Assessment & Plan:  Status post recent hospitalization for acute on chronic systolic heart failure and paroxysmal atrial fibrillation.  Chronic anticoagulation  Chronic kidney disease stage IV followed by nephrology.  Plan: Annual physical exam has been scheduled for May.  She will continue current medications and daily weights and follow-up with Cardiology

## 2017-07-24 DIAGNOSIS — I5023 Acute on chronic systolic (congestive) heart failure: Secondary | ICD-10-CM | POA: Diagnosis not present

## 2017-07-24 DIAGNOSIS — I130 Hypertensive heart and chronic kidney disease with heart failure and stage 1 through stage 4 chronic kidney disease, or unspecified chronic kidney disease: Secondary | ICD-10-CM | POA: Diagnosis not present

## 2017-07-25 DIAGNOSIS — I130 Hypertensive heart and chronic kidney disease with heart failure and stage 1 through stage 4 chronic kidney disease, or unspecified chronic kidney disease: Secondary | ICD-10-CM | POA: Diagnosis not present

## 2017-07-25 DIAGNOSIS — Q210 Ventricular septal defect: Secondary | ICD-10-CM | POA: Diagnosis not present

## 2017-07-25 DIAGNOSIS — I5022 Chronic systolic (congestive) heart failure: Secondary | ICD-10-CM | POA: Diagnosis not present

## 2017-07-25 DIAGNOSIS — I700 Atherosclerosis of aorta: Secondary | ICD-10-CM | POA: Diagnosis not present

## 2017-07-25 DIAGNOSIS — I428 Other cardiomyopathies: Secondary | ICD-10-CM | POA: Diagnosis not present

## 2017-07-25 DIAGNOSIS — E668 Other obesity: Secondary | ICD-10-CM | POA: Diagnosis not present

## 2017-07-25 DIAGNOSIS — R0600 Dyspnea, unspecified: Secondary | ICD-10-CM | POA: Diagnosis not present

## 2017-07-25 DIAGNOSIS — R0602 Shortness of breath: Secondary | ICD-10-CM | POA: Diagnosis not present

## 2017-07-25 DIAGNOSIS — I5023 Acute on chronic systolic (congestive) heart failure: Secondary | ICD-10-CM | POA: Diagnosis not present

## 2017-07-25 DIAGNOSIS — Z95810 Presence of automatic (implantable) cardiac defibrillator: Secondary | ICD-10-CM | POA: Diagnosis not present

## 2017-07-25 DIAGNOSIS — N183 Chronic kidney disease, stage 3 (moderate): Secondary | ICD-10-CM | POA: Diagnosis not present

## 2017-07-25 DIAGNOSIS — Z7901 Long term (current) use of anticoagulants: Secondary | ICD-10-CM | POA: Diagnosis not present

## 2017-07-25 DIAGNOSIS — E039 Hypothyroidism, unspecified: Secondary | ICD-10-CM | POA: Diagnosis not present

## 2017-07-25 DIAGNOSIS — I472 Ventricular tachycardia: Secondary | ICD-10-CM | POA: Diagnosis not present

## 2017-07-26 ENCOUNTER — Telehealth: Payer: Self-pay | Admitting: Internal Medicine

## 2017-07-26 DIAGNOSIS — M4800 Spinal stenosis, site unspecified: Secondary | ICD-10-CM | POA: Diagnosis not present

## 2017-07-26 DIAGNOSIS — I429 Cardiomyopathy, unspecified: Secondary | ICD-10-CM | POA: Diagnosis not present

## 2017-07-26 DIAGNOSIS — I051 Rheumatic mitral insufficiency: Secondary | ICD-10-CM | POA: Diagnosis not present

## 2017-07-26 DIAGNOSIS — I130 Hypertensive heart and chronic kidney disease with heart failure and stage 1 through stage 4 chronic kidney disease, or unspecified chronic kidney disease: Secondary | ICD-10-CM | POA: Diagnosis not present

## 2017-07-26 DIAGNOSIS — I480 Paroxysmal atrial fibrillation: Secondary | ICD-10-CM | POA: Diagnosis not present

## 2017-07-26 DIAGNOSIS — I5023 Acute on chronic systolic (congestive) heart failure: Secondary | ICD-10-CM | POA: Diagnosis not present

## 2017-07-26 DIAGNOSIS — M1030 Gout due to renal impairment, unspecified site: Secondary | ICD-10-CM | POA: Diagnosis not present

## 2017-07-26 DIAGNOSIS — N184 Chronic kidney disease, stage 4 (severe): Secondary | ICD-10-CM | POA: Diagnosis not present

## 2017-07-26 DIAGNOSIS — E039 Hypothyroidism, unspecified: Secondary | ICD-10-CM | POA: Diagnosis not present

## 2017-07-26 NOTE — Telephone Encounter (Signed)
Casey [name] PT, Casey [name] is calling for  Frequency update.  1 time a week for 2 weeks.  Twice x 4 weeks,  Effective 2/1.

## 2017-07-27 DIAGNOSIS — I5023 Acute on chronic systolic (congestive) heart failure: Secondary | ICD-10-CM | POA: Diagnosis not present

## 2017-07-27 DIAGNOSIS — I130 Hypertensive heart and chronic kidney disease with heart failure and stage 1 through stage 4 chronic kidney disease, or unspecified chronic kidney disease: Secondary | ICD-10-CM | POA: Diagnosis not present

## 2017-07-30 ENCOUNTER — Encounter (HOSPITAL_COMMUNITY): Payer: Self-pay

## 2017-07-30 ENCOUNTER — Ambulatory Visit (HOSPITAL_COMMUNITY)
Admission: RE | Admit: 2017-07-30 | Discharge: 2017-07-30 | Disposition: A | Discharge status: Home / Self Care | Payer: Medicare Other | Source: Ambulatory Visit | Attending: Internal Medicine | Admitting: Internal Medicine

## 2017-07-30 VITALS — BP 126/94 | HR 78 | Wt 156.4 lb

## 2017-07-30 DIAGNOSIS — I340 Nonrheumatic mitral (valve) insufficiency: Secondary | ICD-10-CM | POA: Diagnosis not present

## 2017-07-30 DIAGNOSIS — E039 Hypothyroidism, unspecified: Secondary | ICD-10-CM | POA: Insufficient documentation

## 2017-07-30 DIAGNOSIS — Z95810 Presence of automatic (implantable) cardiac defibrillator: Secondary | ICD-10-CM | POA: Diagnosis not present

## 2017-07-30 DIAGNOSIS — I429 Cardiomyopathy, unspecified: Secondary | ICD-10-CM | POA: Insufficient documentation

## 2017-07-30 DIAGNOSIS — I5022 Chronic systolic (congestive) heart failure: Secondary | ICD-10-CM

## 2017-07-30 DIAGNOSIS — I472 Ventricular tachycardia, unspecified: Secondary | ICD-10-CM

## 2017-07-30 DIAGNOSIS — Z8711 Personal history of peptic ulcer disease: Secondary | ICD-10-CM | POA: Diagnosis not present

## 2017-07-30 DIAGNOSIS — I130 Hypertensive heart and chronic kidney disease with heart failure and stage 1 through stage 4 chronic kidney disease, or unspecified chronic kidney disease: Secondary | ICD-10-CM | POA: Insufficient documentation

## 2017-07-30 DIAGNOSIS — N183 Chronic kidney disease, stage 3 unspecified: Secondary | ICD-10-CM

## 2017-07-30 DIAGNOSIS — I5023 Acute on chronic systolic (congestive) heart failure: Secondary | ICD-10-CM | POA: Diagnosis not present

## 2017-07-30 DIAGNOSIS — Z79890 Hormone replacement therapy (postmenopausal): Secondary | ICD-10-CM | POA: Diagnosis not present

## 2017-07-30 DIAGNOSIS — I480 Paroxysmal atrial fibrillation: Secondary | ICD-10-CM

## 2017-07-30 DIAGNOSIS — Z79899 Other long term (current) drug therapy: Secondary | ICD-10-CM | POA: Diagnosis not present

## 2017-07-30 DIAGNOSIS — Z7901 Long term (current) use of anticoagulants: Secondary | ICD-10-CM | POA: Diagnosis not present

## 2017-07-30 DIAGNOSIS — M109 Gout, unspecified: Secondary | ICD-10-CM | POA: Diagnosis not present

## 2017-07-30 DIAGNOSIS — N184 Chronic kidney disease, stage 4 (severe): Secondary | ICD-10-CM | POA: Insufficient documentation

## 2017-07-30 DIAGNOSIS — I4729 Other ventricular tachycardia: Secondary | ICD-10-CM

## 2017-07-30 DIAGNOSIS — I428 Other cardiomyopathies: Secondary | ICD-10-CM | POA: Diagnosis not present

## 2017-07-30 LAB — BASIC METABOLIC PANEL
Anion gap: 12 (ref 5–15)
BUN: 48 mg/dL — AB (ref 6–20)
CALCIUM: 9.7 mg/dL (ref 8.9–10.3)
CHLORIDE: 107 mmol/L (ref 101–111)
CO2: 21 mmol/L — ABNORMAL LOW (ref 22–32)
CREATININE: 2.76 mg/dL — AB (ref 0.44–1.00)
GFR, EST AFRICAN AMERICAN: 18 mL/min — AB (ref >60)
GFR, EST NON AFRICAN AMERICAN: 15 mL/min — AB (ref >60)
Glucose, Bld: 114 mg/dL — ABNORMAL HIGH (ref 65–99)
Potassium: 4.6 mmol/L (ref 3.5–5.1)
SODIUM: 140 mmol/L (ref 135–145)

## 2017-07-30 NOTE — Patient Instructions (Signed)
Routine lab work today. Will notify you of abnormal results, otherwise no news is good news!  Follow up 2 weeks with Oda Kilts PA-C.  Follow up 6 weeks with Dr. Aundra Dubin.  Take all medication as prescribed the day of your appointment. Bring all medications with you to your appointment.  Do the following things EVERYDAY: 1) Weigh yourself in the morning before breakfast. Write it down and keep it in a log. 2) Take your medicines as prescribed 3) Eat low salt foods-Limit salt (sodium) to 2000 mg per day.  4) Stay as active as you can everyday 5) Limit all fluids for the day to less than 2 liters

## 2017-07-30 NOTE — Progress Notes (Signed)
Advanced Heart Failure Clinic Note   Referring Physician: PCP: Elby Showers, MD PCP-Cardiologist: Ezzard Standing, MD   HPI:  Casey [name] is a 83 y.o. female with long history of nonischemic cardiomyopathy, now CKD stage 3-4. She had cath in 5/05 with normal coronaries. She has MDT CRT-D device. Not on ACEI/ARB/ARNI due to renal dysfunction (tried all in the past). On warfarin with history of PAF. Last echo in 5/17 with EF 20%, moderate MR, moderate TR, small VSD.   Admitted 07/06/17 with increase SOB and decreased urine output. Diuresed with IV lasix and metolazone and lost 7 lbs with improvement in symptoms. Transitioned from last to torsemide. Meds optimized but limited due to CKD IV.   Pt presents today for post hospital follow up. Feels "much better" post hospitalization. She denies SOB with her ADLs and is working with [facility]. Denies orthopnea, lightheadedness or dizziness. Seen in PCP last week. Creatinine up slightly. She has good UOP on torsemide. Denies bleeding on coumadin. She has been taking all medication as directed.   Review of systems complete and found to be negative unless listed in HPI.    Past Medical History  1. Chronic systolic CHF: Long-standing NICM, 5/05 cath without significant CAD. Echo in 5/17 with EF 20%, echo this admission with EF 10-15%, prominent dyssynchrony, moderate-severe MR, PASP 91 (but ?if this represents VSD flow rather than TR). Complicated by CKD stage 3-4. Has MDT CRT-D device but wide QRS 188 msec. - No ACEI/ARB/ARNI: has failed in the past with AKI.  - Given dyssynchrony by echo and wide QRS, will have EP evaluate to see if BiV pacing can be adjusted => EP performed V-V optimization admit 06/2017  2. Atrial fibrillation: Paroxysmal. - Maintaining NSR on amio. Warfarin for AC.  3. CKD: Stage 4.  - Follows with nephrology 4. H/o VSD: - likely restrictive/small.  ?If elevated PA pressure by echo represents VSD jet rather than TR  jet => this may actually be reflective of VSD jet velocity. 5. Mitral regurgitation:  - Moderate-severe central MR.  Functional.  May be candidate for MV clip in future.     Past Medical History:  Diagnosis Date  . Atherosclerosis of aorta ([place])   . Biventricular ICD (implantable cardiac defibrillator) in place    medtronic   . Chronic kidney disease (CKD), stage III (moderate) (HCC)   . Chronic systolic heart failure ([place])   . Gout   . History of peptic ulcer disease   . Hypertension   . Hypothyroidism   . Lumbar disc disease   . Nonischemic cardiomyopathy ([place])   . Ventricular septal defect   . VT (ventricular tachycardia) ([place])    appropriate therapy via ICD 2013    Current Outpatient Medications  Medication Sig Dispense Refill  . allopurinol (ZYLOPRIM) 100 MG tablet TAKE 1 TABLET BY MOUTH EVERY DAY (Patient taking differently: TAKE 1 TABLET (100mg ) BY MOUTH EVERY DAY) 30 tablet 11  . amiodarone (PACERONE) 200 MG tablet Take 200 mg by mouth daily.    . brinzolamide (AZOPT) 1 % ophthalmic suspension Place 1 drop into both eyes 3 (three) times daily.    . hydrALAZINE (APRESOLINE) 10 MG tablet Take 1 tablet (10 mg total) by mouth 3 (three) times daily. 90 tablet 6  . isosorbide mononitrate (IMDUR) 30 MG 24 hr tablet Take 1 tablet (30 mg total) by mouth daily. 30 tablet 6  . latanoprost (XALATAN) 0.005 % ophthalmic solution Place 1 drop into both eyes at  bedtime.    Marland Kitchen levothyroxine (SYNTHROID, LEVOTHROID) 75 MCG tablet Take 75 mcg by mouth daily before breakfast.     . metoprolol succinate (TOPROL-XL) 25 MG 24 hr tablet Take 1 tablet (25 mg total) by mouth daily. 30 tablet 6  . Multiple Vitamins-Calcium (ONE-A-DAY WOMENS FORMULA) TABS Take 1 tablet by mouth daily.      . potassium chloride SA (K-DUR,KLOR-CON) 20 MEQ tablet Take 20 mEq by mouth daily.     Marland Kitchen torsemide (DEMADEX) 20 MG tablet Take 3 tablets (60 mg total) by mouth daily. 90 tablet 6  . vitamin D, CHOLECALCIFEROL, 400  UNITS tablet Take 800 Units by mouth daily.    Marland Kitchen warfarin (COUMADIN) 2 MG tablet Take 1 tablet (2 mg total) by mouth daily. 30 tablet 6   No current facility-administered medications for this encounter.     Allergies  Allergen Reactions  . Codeine     Unknown      Social History   Socioeconomic History  . Marital status: Married    Spouse name: Not on file  . Number of children: Not on file  . Years of education: Not on file  . Highest education level: Not on file  Social Needs  . Financial resource strain: Not on file  . Food insecurity - worry: Not on file  . Food insecurity - inability: Not on file  . Transportation needs - medical: Not on file  . Transportation needs - non-medical: Not on file  Occupational History  . Not on file  Tobacco Use  . Smoking status: Never Smoker  . Smokeless tobacco: Never Used  Substance and Sexual Activity  . Alcohol use: No  . Drug use: No  . Sexual activity: Not on file  Other Topics Concern  . Not on file  Social History Narrative  . Not on file      Family History  Problem Relation Age of Onset  . Heart failure Mother   . Cancer Father     Vitals:   07/30/17 1005  BP: (!) 126/94  Pulse: 78  SpO2: 93%  Weight: 156 lb 6.4 oz (70.9 kg)   Wt Readings from Last 3 Encounters:  07/30/17 156 lb 6.4 oz (70.9 kg)  07/23/17 156 lb (70.8 kg)  07/11/17 155 lb 1.6 oz (70.4 kg)    PHYSICAL EXAM: General:  Elderly appearing. NAD.  HEENT: normal Neck: supple. no JVD. Carotids 2+ bilat; no bruits. No lymphadenopathy or thyromegaly appreciated. Cor: PMI nondisplaced. Regular rate & rhythm. 2/6 [facility].  Lungs: CTAB, normal effort. Abdomen: soft, nontender, nondistended. No hepatosplenomegaly. No bruits or masses. Good bowel sounds. Extremities: no cyanosis, clubbing, rash, edema Neuro: alert & oriented x 3, cranial nerves grossly intact. moves all 4 extremities w/o difficulty. Affect pleasant.  ASSESSMENT & PLAN:  1.  Chronic systolic CHF: Long-standing NICM, 5/05 cath without significant CAD. Echo in 5/17 with EF 20%, echo this admission with EF 10-15%, prominent dyssynchrony, moderate-severe MR, PASP 91 (but ?if this represents VSD flow rather than TR). Complicated by CKD stage 3-4. Has MDT CRT-D device but wide QRS 188 msec.  - NYHA II symptoms currently - Volume status stable on exam - Continue torsemide 60 mg daily + KCl 20 mEq daily  - Continue Toprol XL 25 mg daily.  - No ACEI/ARB/ARNI: has failed in the past with AKI.  - Continue hydralazine 10 mg tid + Imdur 30 daily, no BP room to increase.  - Most recent creatinine contraindication for  spironolactone. BMET today.  - CRT recently underwent V-V optimization per EP.  2. Atrial fibrillation: Paroxysmal. Maintaining NSR by ICD - Continue amiodarone.  - Continue warfarin per pharmacy. Denies bleeding.  3. AKI on CKD: Stage 4 - Most recent creatinine 2.4 -> 2.9. BMET today.  4. H/o VSD: likely restrictive/small.  - ?If elevated PA pressure by echo represents VSD jet rather than TR jet => this may actually be reflective of VSD jet velocity. No change. 5. Mitral regurgitation: Moderate-severe central MR.  Functional.   - May be candidate for MV clip in future. No change.  Doing well post-hospitalization. No room to move meds today with AKI on CKD IV. RTC 2 weeks for re-assessment, 6 weeks with MD visit. Not a candidate for advanced therapies with age and CKD IV.   Shirley Friar, PA-C 07/30/17   Greater than 50% of the 25 minute visit was spent in counseling/coordination of care regarding disease state education, salt/fluid restriction, sliding scale diuretics, and medication compliance.

## 2017-07-31 ENCOUNTER — Other Ambulatory Visit: Payer: Medicare Other | Admitting: Internal Medicine

## 2017-08-01 ENCOUNTER — Other Ambulatory Visit: Payer: Self-pay | Admitting: Internal Medicine

## 2017-08-01 DIAGNOSIS — Z6831 Body mass index (BMI) 31.0-31.9, adult: Secondary | ICD-10-CM | POA: Diagnosis not present

## 2017-08-01 DIAGNOSIS — R7989 Other specified abnormal findings of blood chemistry: Secondary | ICD-10-CM

## 2017-08-01 DIAGNOSIS — N183 Chronic kidney disease, stage 3 (moderate): Secondary | ICD-10-CM | POA: Diagnosis not present

## 2017-08-01 DIAGNOSIS — N2581 Secondary hyperparathyroidism of renal origin: Secondary | ICD-10-CM | POA: Diagnosis not present

## 2017-08-01 DIAGNOSIS — R809 Proteinuria, unspecified: Secondary | ICD-10-CM | POA: Diagnosis not present

## 2017-08-01 DIAGNOSIS — D631 Anemia in chronic kidney disease: Secondary | ICD-10-CM | POA: Diagnosis not present

## 2017-08-01 DIAGNOSIS — I509 Heart failure, unspecified: Secondary | ICD-10-CM

## 2017-08-02 ENCOUNTER — Other Ambulatory Visit: Payer: Medicare Other | Admitting: Internal Medicine

## 2017-08-02 DIAGNOSIS — I5023 Acute on chronic systolic (congestive) heart failure: Secondary | ICD-10-CM | POA: Diagnosis not present

## 2017-08-02 DIAGNOSIS — I130 Hypertensive heart and chronic kidney disease with heart failure and stage 1 through stage 4 chronic kidney disease, or unspecified chronic kidney disease: Secondary | ICD-10-CM | POA: Diagnosis not present

## 2017-08-05 NOTE — Patient Instructions (Signed)
Continue same medications and follow-up with Cardiology and Nephrology.  Weigh yourself daily.  Physical exam scheduled for May.

## 2017-08-06 DIAGNOSIS — I5023 Acute on chronic systolic (congestive) heart failure: Secondary | ICD-10-CM | POA: Diagnosis not present

## 2017-08-06 DIAGNOSIS — I130 Hypertensive heart and chronic kidney disease with heart failure and stage 1 through stage 4 chronic kidney disease, or unspecified chronic kidney disease: Secondary | ICD-10-CM | POA: Diagnosis not present

## 2017-08-07 ENCOUNTER — Encounter: Payer: Self-pay | Admitting: Nurse Practitioner

## 2017-08-10 DIAGNOSIS — I5023 Acute on chronic systolic (congestive) heart failure: Secondary | ICD-10-CM | POA: Diagnosis not present

## 2017-08-10 DIAGNOSIS — I130 Hypertensive heart and chronic kidney disease with heart failure and stage 1 through stage 4 chronic kidney disease, or unspecified chronic kidney disease: Secondary | ICD-10-CM | POA: Diagnosis not present

## 2017-08-13 ENCOUNTER — Ambulatory Visit (HOSPITAL_COMMUNITY)
Admission: RE | Admit: 2017-08-13 | Discharge: 2017-08-13 | Disposition: A | Discharge status: Home / Self Care | Payer: Medicare Other | Source: Ambulatory Visit | Attending: Cardiology | Admitting: Cardiology

## 2017-08-13 ENCOUNTER — Encounter (HOSPITAL_COMMUNITY): Payer: Self-pay

## 2017-08-13 VITALS — BP 112/68 | HR 77 | Wt 158.6 lb

## 2017-08-13 DIAGNOSIS — Z79890 Hormone replacement therapy (postmenopausal): Secondary | ICD-10-CM | POA: Insufficient documentation

## 2017-08-13 DIAGNOSIS — I110 Hypertensive heart disease with heart failure: Secondary | ICD-10-CM | POA: Diagnosis not present

## 2017-08-13 DIAGNOSIS — I130 Hypertensive heart and chronic kidney disease with heart failure and stage 1 through stage 4 chronic kidney disease, or unspecified chronic kidney disease: Secondary | ICD-10-CM | POA: Insufficient documentation

## 2017-08-13 DIAGNOSIS — N183 Chronic kidney disease, stage 3 unspecified: Secondary | ICD-10-CM

## 2017-08-13 DIAGNOSIS — Z79899 Other long term (current) drug therapy: Secondary | ICD-10-CM | POA: Diagnosis not present

## 2017-08-13 DIAGNOSIS — I5020 Unspecified systolic (congestive) heart failure: Secondary | ICD-10-CM | POA: Diagnosis not present

## 2017-08-13 DIAGNOSIS — I480 Paroxysmal atrial fibrillation: Secondary | ICD-10-CM | POA: Diagnosis not present

## 2017-08-13 DIAGNOSIS — Z885 Allergy status to narcotic agent status: Secondary | ICD-10-CM | POA: Insufficient documentation

## 2017-08-13 DIAGNOSIS — I428 Other cardiomyopathies: Secondary | ICD-10-CM

## 2017-08-13 DIAGNOSIS — I340 Nonrheumatic mitral (valve) insufficiency: Secondary | ICD-10-CM | POA: Insufficient documentation

## 2017-08-13 DIAGNOSIS — Z95810 Presence of automatic (implantable) cardiac defibrillator: Secondary | ICD-10-CM | POA: Insufficient documentation

## 2017-08-13 DIAGNOSIS — Z7901 Long term (current) use of anticoagulants: Secondary | ICD-10-CM | POA: Diagnosis not present

## 2017-08-13 DIAGNOSIS — N184 Chronic kidney disease, stage 4 (severe): Secondary | ICD-10-CM | POA: Insufficient documentation

## 2017-08-13 DIAGNOSIS — I5022 Chronic systolic (congestive) heart failure: Secondary | ICD-10-CM

## 2017-08-13 DIAGNOSIS — E039 Hypothyroidism, unspecified: Secondary | ICD-10-CM | POA: Insufficient documentation

## 2017-08-13 DIAGNOSIS — I429 Cardiomyopathy, unspecified: Secondary | ICD-10-CM | POA: Insufficient documentation

## 2017-08-13 LAB — BASIC METABOLIC PANEL
ANION GAP: 12 (ref 5–15)
BUN: 36 mg/dL — ABNORMAL HIGH (ref 6–20)
CO2: 22 mmol/L (ref 22–32)
Calcium: 9.4 mg/dL (ref 8.9–10.3)
Chloride: 107 mmol/L (ref 101–111)
Creatinine, Ser: 2.6 mg/dL — ABNORMAL HIGH (ref 0.44–1.00)
GFR calc non Af Amer: 16 mL/min — ABNORMAL LOW (ref >60)
GFR, EST AFRICAN AMERICAN: 19 mL/min — AB (ref >60)
GLUCOSE: 138 mg/dL — AB (ref 65–99)
Potassium: 3.9 mmol/L (ref 3.5–5.1)
Sodium: 141 mmol/L (ref 135–145)

## 2017-08-13 NOTE — Progress Notes (Signed)
Advanced Heart Failure Clinic Note   Referring Physician: PCP: Elby Showers, MD PCP-Cardiologist: Ezzard Standing, MD   HPI:  Casey [name] is a 83 y.o. female with long history of nonischemic cardiomyopathy, now CKD stage 3-4. She had cath in 5/05 with normal coronaries. She has MDT CRT-D device. Not on ACEI/ARB/ARNI due to renal dysfunction (tried all in the past). On warfarin with history of PAF. Last echo in 5/17 with EF 20%, moderate MR, moderate TR, small VSD.   Admitted 07/06/17 with increase SOB and decreased urine output. Diuresed with IV lasix and metolazone and lost 7 lbs with improvement in symptoms. Transitioned from last to torsemide. Meds optimized but limited due to CKD IV.   She presents today for regular follow. Seen 2 weeks ago. No medication changes due to CKD IV.  She continues to improve s/p hospitalization. Finished HHPT this week. Taking all medications as directed. She denies SOB with ADLs. Can walk around her block at pace without SOB.  Denies lightheadedness or dizziness. No CP, palpitations, or bleeding.  She has good UOP on torsemide. Clear to yellow. She has not taken any extra.  She tries to watch fluid intake, but has been eating a lot of soup in the cold weather. Was unaware this counted towards her fluid restriction.   Review of systems complete and found to be negative unless listed in HPI.    Past Medical History  1. Chronic systolic CHF: Long-standing NICM, 5/05 cath without significant CAD. Echo in 5/17 with EF 20%, echo this admission with EF 10-15%, prominent dyssynchrony, moderate-severe MR, PASP 91 (but ?if this represents VSD flow rather than TR). Complicated by CKD stage 3-4. Has MDT CRT-D device but wide QRS 188 msec. - No ACEI/ARB/ARNI: has failed in the past with AKI.  - Given dyssynchrony by echo and wide QRS, will have EP evaluate to see if BiV pacing can be adjusted => EP performed V-V optimization admit 06/2017  2. Atrial  fibrillation: Paroxysmal. - Maintaining NSR on amio. Warfarin for AC.  3. CKD: Stage 4.  - Follows with nephrology 4. H/o VSD: - likely restrictive/small.  ?If elevated PA pressure by echo represents VSD jet rather than TR jet => this may actually be reflective of VSD jet velocity. 5. Mitral regurgitation:  - Moderate-severe central MR.  Functional.  May be candidate for MV clip in future.   Past Medical History:  Diagnosis Date  . Atherosclerosis of aorta ([place])   . Biventricular ICD (implantable cardiac defibrillator) in place    medtronic   . Chronic kidney disease (CKD), stage III (moderate) ([facility])   . Chronic systolic heart failure ([place])   . Gout   . History of peptic ulcer disease   . Hypertension   . Hypothyroidism   . Lumbar disc disease   . Nonischemic cardiomyopathy ([place])   . Ventricular septal defect   . VT (ventricular tachycardia) ([place])    appropriate therapy via ICD 2013    Current Outpatient Medications  Medication Sig Dispense Refill  . allopurinol (ZYLOPRIM) 100 MG tablet TAKE 1 TABLET BY MOUTH EVERY DAY 30 tablet 11  . amiodarone (PACERONE) 200 MG tablet Take 200 mg by mouth daily.    . brinzolamide (AZOPT) 1 % ophthalmic suspension Place 1 drop into both eyes 3 (three) times daily.    . hydrALAZINE (APRESOLINE) 10 MG tablet Take 1 tablet (10 mg total) by mouth 3 (three) times daily. 90 tablet 6  . isosorbide mononitrate (IMDUR)  30 MG 24 hr tablet Take 1 tablet (30 mg total) by mouth daily. 30 tablet 6  . latanoprost (XALATAN) 0.005 % ophthalmic solution Place 1 drop into both eyes at bedtime.    Marland Kitchen levothyroxine (SYNTHROID, LEVOTHROID) 75 MCG tablet Take 75 mcg by mouth daily before breakfast.     . metoprolol succinate (TOPROL-XL) 25 MG 24 hr tablet Take 1 tablet (25 mg total) by mouth daily. 30 tablet 6  . Multiple Vitamins-Calcium (ONE-A-DAY WOMENS FORMULA) TABS Take 1 tablet by mouth daily.      . potassium chloride SA (K-DUR,KLOR-CON) 20 MEQ tablet Take 20  mEq by mouth daily.     Marland Kitchen torsemide (DEMADEX) 20 MG tablet Take 3 tablets (60 mg total) by mouth daily. 90 tablet 6  . vitamin D, CHOLECALCIFEROL, 400 UNITS tablet Take 800 Units by mouth daily.    Marland Kitchen warfarin (COUMADIN) 2 MG tablet Take 1 tablet (2 mg total) by mouth daily. 30 tablet 6   No current facility-administered medications for this encounter.     Allergies  Allergen Reactions  . Codeine     Unknown      Social History   Socioeconomic History  . Marital status: Married    Spouse name: Not on file  . Number of children: Not on file  . Years of education: Not on file  . Highest education level: Not on file  Social Needs  . Financial resource strain: Not on file  . Food insecurity - worry: Not on file  . Food insecurity - inability: Not on file  . Transportation needs - medical: Not on file  . Transportation needs - non-medical: Not on file  Occupational History  . Not on file  Tobacco Use  . Smoking status: Never Smoker  . Smokeless tobacco: Never Used  Substance and Sexual Activity  . Alcohol use: No  . Drug use: No  . Sexual activity: Not on file  Other Topics Concern  . Not on file  Social History Narrative  . Not on file      Family History  Problem Relation Age of Onset  . Heart failure Mother   . Cancer Father    Vitals:   08/13/17 1154  BP: 112/68  Pulse: 77  SpO2: 96%  Weight: 158 lb 9.6 oz (71.9 kg)     Wt Readings from Last 3 Encounters:  08/13/17 158 lb 9.6 oz (71.9 kg)  07/30/17 156 lb 6.4 oz (70.9 kg)  07/23/17 156 lb (70.8 kg)    PHYSICAL EXAM: General: Elderly appearing. No resp difficulty. HEENT: Normal Neck: Supple. JVP ~6-7. Carotids 2+ bilat; no bruits. No thyromegaly or nodule noted. Cor: PMI nondisplaced. RRR, 2/6 HSM. Lungs: CTAB, normal effort. Abdomen: Soft, non-tender, non-distended, no HSM. No bruits or masses. +BS  Extremities: No cyanosis, clubbing, or rash. Trace ankle edema.  Neuro: Alert & orientedx3,  cranial nerves grossly intact. moves all 4 extremities w/o difficulty. Affect pleasant   ASSESSMENT & PLAN:  1. Chronic systolic CHF: Long-standing NICM, 5/05 cath without significant CAD. Echo in 5/17 with EF 20%, echo this admission with EF 10-15%, prominent dyssynchrony, moderate-severe MR, PASP 91 (but ?if this represents VSD flow rather than TR). Complicated by CKD stage 3-4. Has MDT CRT-D device but wide QRS 188 msec.  - NYHA II symptoms currently.  - Volume status stable on exam.  - Continue torsemide 60 mg daily + KCl 20 mEq daily. BMET today.  - Continue Toprol XL 25 mg daily.  -  No ACEI/ARB/ARNI: has failed in the past with AKI.  - Continue hydralazine 10 mg tid + Imdur 30 daily, no BP room to increase.  - Most recent creatinine contraindication for spironolactone. BMET today.  - CRT recently underwent V-V optimization per EP.  - Reinforced fluid restriction to < 2 L daily, sodium restriction to less than 2000 mg daily, and the importance of daily weights.   2. Atrial fibrillation: Paroxysmal. Maintaining NSR by ICD - Continue amiodarone.  - Continue warfarin per pharmacy. Denies bleeding.  3. CKD: Stage 4 - Baseline creatinine appears to be 2.0 - 2.4. Waiting to equilibrate after recent hospitalization.  BMET today.  4. H/o VSD: likely restrictive/small.  - ?If elevated PA pressure by echo represents VSD jet rather than TR jet => this may actually be reflective of VSD jet velocity. No change.  5. Mitral regurgitation: Moderate-severe central MR.  Functional.   - May be candidate for MV clip in future. No change.   No room for med adjustment with CKD 4. Continue current torsemide. Sliding scale as needed. RTC 4 weeks with MD visit. Labs today.   Shirley Friar, PA-C 08/13/17   Greater than 50% of the 25 minute visit was spent in counseling/coordination of care regarding disease state education, salt/fluid restriction, sliding scale diuretics, and medication  compliance.

## 2017-08-13 NOTE — Patient Instructions (Signed)
Routine lab work today. Will notify you of abnormal results, otherwise no news is good news!  Follow up as scheduled.  Take all medication as prescribed the day of your appointment. Bring all medications with you to your appointment.  Do the following things EVERYDAY: 1) Weigh yourself in the morning before breakfast. Write it down and keep it in a log. 2) Take your medicines as prescribed 3) Eat low salt foods-Limit salt (sodium) to 2000 mg per day.  4) Stay as active as you can everyday 5) Limit all fluids for the day to less than 2 liters  

## 2017-08-16 DIAGNOSIS — I5023 Acute on chronic systolic (congestive) heart failure: Secondary | ICD-10-CM | POA: Diagnosis not present

## 2017-08-16 DIAGNOSIS — I130 Hypertensive heart and chronic kidney disease with heart failure and stage 1 through stage 4 chronic kidney disease, or unspecified chronic kidney disease: Secondary | ICD-10-CM | POA: Diagnosis not present

## 2017-08-17 DIAGNOSIS — R0602 Shortness of breath: Secondary | ICD-10-CM | POA: Diagnosis not present

## 2017-08-17 DIAGNOSIS — Z95810 Presence of automatic (implantable) cardiac defibrillator: Secondary | ICD-10-CM | POA: Diagnosis not present

## 2017-08-17 DIAGNOSIS — E039 Hypothyroidism, unspecified: Secondary | ICD-10-CM | POA: Diagnosis not present

## 2017-08-17 DIAGNOSIS — I5022 Chronic systolic (congestive) heart failure: Secondary | ICD-10-CM | POA: Diagnosis not present

## 2017-08-17 DIAGNOSIS — E668 Other obesity: Secondary | ICD-10-CM | POA: Diagnosis not present

## 2017-08-17 DIAGNOSIS — I472 Ventricular tachycardia: Secondary | ICD-10-CM | POA: Diagnosis not present

## 2017-08-17 DIAGNOSIS — I428 Other cardiomyopathies: Secondary | ICD-10-CM | POA: Diagnosis not present

## 2017-08-17 DIAGNOSIS — N183 Chronic kidney disease, stage 3 (moderate): Secondary | ICD-10-CM | POA: Diagnosis not present

## 2017-08-17 DIAGNOSIS — Z7901 Long term (current) use of anticoagulants: Secondary | ICD-10-CM | POA: Diagnosis not present

## 2017-08-17 DIAGNOSIS — Q210 Ventricular septal defect: Secondary | ICD-10-CM | POA: Diagnosis not present

## 2017-08-17 DIAGNOSIS — I700 Atherosclerosis of aorta: Secondary | ICD-10-CM | POA: Diagnosis not present

## 2017-08-17 DIAGNOSIS — R0600 Dyspnea, unspecified: Secondary | ICD-10-CM | POA: Diagnosis not present

## 2017-08-22 DIAGNOSIS — Z7901 Long term (current) use of anticoagulants: Secondary | ICD-10-CM | POA: Diagnosis not present

## 2017-08-22 DIAGNOSIS — I472 Ventricular tachycardia: Secondary | ICD-10-CM | POA: Diagnosis not present

## 2017-08-22 DIAGNOSIS — Q210 Ventricular septal defect: Secondary | ICD-10-CM | POA: Diagnosis not present

## 2017-08-22 DIAGNOSIS — E668 Other obesity: Secondary | ICD-10-CM | POA: Diagnosis not present

## 2017-08-22 DIAGNOSIS — R0602 Shortness of breath: Secondary | ICD-10-CM | POA: Diagnosis not present

## 2017-08-22 DIAGNOSIS — E039 Hypothyroidism, unspecified: Secondary | ICD-10-CM | POA: Diagnosis not present

## 2017-08-22 DIAGNOSIS — N183 Chronic kidney disease, stage 3 (moderate): Secondary | ICD-10-CM | POA: Diagnosis not present

## 2017-08-22 DIAGNOSIS — I5022 Chronic systolic (congestive) heart failure: Secondary | ICD-10-CM | POA: Diagnosis not present

## 2017-08-22 DIAGNOSIS — Z95810 Presence of automatic (implantable) cardiac defibrillator: Secondary | ICD-10-CM | POA: Diagnosis not present

## 2017-08-22 DIAGNOSIS — I428 Other cardiomyopathies: Secondary | ICD-10-CM | POA: Diagnosis not present

## 2017-08-22 DIAGNOSIS — R0600 Dyspnea, unspecified: Secondary | ICD-10-CM | POA: Diagnosis not present

## 2017-08-22 DIAGNOSIS — I700 Atherosclerosis of aorta: Secondary | ICD-10-CM | POA: Diagnosis not present

## 2017-08-22 NOTE — Addendum Note (Signed)
Encounter addended by: Shirley Friar, PA-C on: 08/22/2017 7:14 AM  Actions taken: LOS modified

## 2017-09-06 DIAGNOSIS — H401112 Primary open-angle glaucoma, right eye, moderate stage: Secondary | ICD-10-CM | POA: Diagnosis not present

## 2017-09-11 ENCOUNTER — Encounter (HOSPITAL_COMMUNITY): Payer: Self-pay | Admitting: Cardiology

## 2017-09-11 ENCOUNTER — Ambulatory Visit (HOSPITAL_COMMUNITY)
Admission: RE | Admit: 2017-09-11 | Discharge: 2017-09-11 | Disposition: A | Discharge status: Home / Self Care | Payer: Medicare Other | Source: Ambulatory Visit | Attending: Cardiology | Admitting: Cardiology

## 2017-09-11 VITALS — BP 129/93 | HR 73 | Wt 161.5 lb

## 2017-09-11 DIAGNOSIS — I429 Cardiomyopathy, unspecified: Secondary | ICD-10-CM | POA: Insufficient documentation

## 2017-09-11 DIAGNOSIS — N184 Chronic kidney disease, stage 4 (severe): Secondary | ICD-10-CM | POA: Diagnosis not present

## 2017-09-11 DIAGNOSIS — I959 Hypotension, unspecified: Secondary | ICD-10-CM | POA: Insufficient documentation

## 2017-09-11 DIAGNOSIS — I340 Nonrheumatic mitral (valve) insufficiency: Secondary | ICD-10-CM | POA: Insufficient documentation

## 2017-09-11 DIAGNOSIS — Z7901 Long term (current) use of anticoagulants: Secondary | ICD-10-CM | POA: Diagnosis not present

## 2017-09-11 DIAGNOSIS — I428 Other cardiomyopathies: Secondary | ICD-10-CM

## 2017-09-11 DIAGNOSIS — M109 Gout, unspecified: Secondary | ICD-10-CM | POA: Diagnosis not present

## 2017-09-11 DIAGNOSIS — Z79899 Other long term (current) drug therapy: Secondary | ICD-10-CM | POA: Diagnosis not present

## 2017-09-11 DIAGNOSIS — I5022 Chronic systolic (congestive) heart failure: Secondary | ICD-10-CM | POA: Diagnosis not present

## 2017-09-11 DIAGNOSIS — Z8711 Personal history of peptic ulcer disease: Secondary | ICD-10-CM | POA: Insufficient documentation

## 2017-09-11 DIAGNOSIS — I480 Paroxysmal atrial fibrillation: Secondary | ICD-10-CM | POA: Insufficient documentation

## 2017-09-11 DIAGNOSIS — E039 Hypothyroidism, unspecified: Secondary | ICD-10-CM | POA: Insufficient documentation

## 2017-09-11 LAB — COMPREHENSIVE METABOLIC PANEL
ALBUMIN: 3.7 g/dL (ref 3.5–5.0)
ALK PHOS: 116 U/L (ref 38–126)
ALT: 18 U/L (ref 14–54)
ANION GAP: 9 (ref 5–15)
AST: 23 U/L (ref 15–41)
BILIRUBIN TOTAL: 1.3 mg/dL — AB (ref 0.3–1.2)
BUN: 44 mg/dL — AB (ref 6–20)
CALCIUM: 9.7 mg/dL (ref 8.9–10.3)
CO2: 22 mmol/L (ref 22–32)
Chloride: 107 mmol/L (ref 101–111)
Creatinine, Ser: 2.66 mg/dL — ABNORMAL HIGH (ref 0.44–1.00)
GFR calc Af Amer: 18 mL/min — ABNORMAL LOW (ref >60)
GFR calc non Af Amer: 16 mL/min — ABNORMAL LOW (ref >60)
GLUCOSE: 91 mg/dL (ref 65–99)
Potassium: 4.3 mmol/L (ref 3.5–5.1)
Sodium: 138 mmol/L (ref 135–145)
TOTAL PROTEIN: 6.6 g/dL (ref 6.5–8.1)

## 2017-09-11 MED ORDER — HYDRALAZINE HCL 25 MG PO TABS: 25.0000 mg | ORAL_TABLET | Freq: Three times a day (TID) | ORAL | 3 refills | Status: DC

## 2017-09-11 MED ORDER — TORSEMIDE 20 MG PO TABS: ORAL_TABLET | ORAL | 6 refills | Status: DC

## 2017-09-11 NOTE — Patient Instructions (Signed)
Increase Hydralazine 25 mg (1 tab), three times a day  Increase Torsemide 60 mg (3 tabs), alternating with 80 mg (4 tabs) every other day.  Labs drawn today (if we do not call you, then your lab work was stable)   Your physician recommends that you return for lab work in: 10 days   Your physician has requested that you have a TEE. During a TEE, sound waves are used to create images of your heart. It provides your doctor with information about the size and shape of your heart and how well your heart's chambers and valves are working. In this test, a transducer is attached to the end of a flexible tube that's guided down your throat and into your esophagus (the tube leading from you mouth to your stomach) to get a more detailed image of your heart. You are not awake for the procedure. Please see the instruction sheet given to you today. For further information please visit [email or web address].   Please Call to schedule  Your physician recommends that you schedule a follow-up appointment in: 1 month with Dr. Aundra Dubin

## 2017-09-11 NOTE — Progress Notes (Signed)
Advanced Heart Failure Clinic Note   Referring Physician: PCP: Elby Showers, MD PCP-Cardiologist: Ezzard Standing, MD  HF Cardiology: Dr. Aundra Dubin  HPI:  Casey [name] is a 83 y.o. female with long history of nonischemic cardiomyopathy, now CKD stage 3-4. She had cath in 5/05 with normal coronaries. She has MDT CRT-D device. Not on ACEI/ARB/ARNI due to renal dysfunction (tried all in the past). On warfarin with history of paroxysmal atrial fibrillation. Echo in 5/17 with EF 20%, moderate MR, moderate TR, small VSD.   Admitted 07/06/17 with increase SOB and decreased urine output. Echo with EF 10-15%, moderate-severe MR.  Diuresed with IV lasix and metolazone and lost 7 lbs with improvement in symptoms. Transitioned from last to torsemide. Meds optimized but limited due to CKD IV.   She returns for followup of CHF.  Weight is up about 3 lbs on our scale and also up a bit at home.  BP is higher than in the past.  She is short of breath walking up hills or inclines.  Generally ok walking on flat ground.  No chest pain, lightheadedness, falls. No orthopnea/PND. She is able to walk around [place] without getting excessively tired. She has been under a lot of stress, husband is at a SNF and she is home by herself.   Labs (3/19): K 3.9, creatinine 2.6, hgb 15.3  Medtronic device interrogated today: Fluid index < threshold.  No atrial fibrillation or VT.   Review of systems complete and found to be negative unless listed in HPI.    Past Medical History 1. Chronic systolic CHF: Long-standing nonischemic cardiomyopathy, 5/05 cath without significant CAD. Echo in 5/17 with EF 20%.  - Echo 1/19 with EF 10-15%, prominent dyssynchrony, moderate-severe MR, PASP 91 (but ?if this represents VSD flow rather than TR).  - MDT CRT-D device.  2. Atrial fibrillation: Paroxysmal. She is on amiodarone and warfarin.  3. CKD: Stage 4. Follows with nephrology (Dr. Justin Mend).  4. H/o VSD:   Small/restrictive.  5. Mitral regurgitation: Moderate-severe central MR on 1/19 echo, likely functional.   6. Gout 7. Hypothyroidism 8. PUD  Current Outpatient Medications  Medication Sig Dispense Refill  . allopurinol (ZYLOPRIM) 100 MG tablet TAKE 1 TABLET BY MOUTH EVERY DAY 30 tablet 11  . amiodarone (PACERONE) 200 MG tablet Take 200 mg by mouth daily.    . brinzolamide (AZOPT) 1 % ophthalmic suspension Place 1 drop into both eyes 3 (three) times daily.    . dorzolamide (TRUSOPT) 2 % ophthalmic solution Place 1 drop into both eyes 3 (three) times daily.    . hydrALAZINE (APRESOLINE) 25 MG tablet Take 1 tablet (25 mg total) by mouth 3 (three) times daily. 90 tablet 3  . isosorbide mononitrate (IMDUR) 30 MG 24 hr tablet Take 1 tablet (30 mg total) by mouth daily. 30 tablet 6  . Latanoprost 0.005 % EMUL Apply 1 drop to eye at bedtime.    Marland Kitchen levothyroxine (SYNTHROID, LEVOTHROID) 75 MCG tablet Take 75 mcg by mouth daily before breakfast.     . metoprolol succinate (TOPROL-XL) 25 MG 24 hr tablet Take 1 tablet (25 mg total) by mouth daily. 30 tablet 6  . Multiple Vitamins-Calcium (ONE-A-DAY WOMENS FORMULA) TABS Take 1 tablet by mouth daily.      . potassium chloride SA (K-DUR,KLOR-CON) 20 MEQ tablet Take 20 mEq by mouth daily.     Marland Kitchen torsemide (DEMADEX) 20 MG tablet Take 60 mg (3 tabs), alternating with 80 mg (4 tabs) every other  day. 105 tablet 6  . vitamin D, CHOLECALCIFEROL, 400 UNITS tablet Take 800 Units by mouth daily.    Marland Kitchen warfarin (COUMADIN) 2 MG tablet Take 1 tablet (2 mg total) by mouth daily. 30 tablet 6   No current facility-administered medications for this encounter.     Allergies  Allergen Reactions  . Codeine     Unknown      Social History   Socioeconomic History  . Marital status: Married    Spouse name: Not on file  . Number of children: Not on file  . Years of education: Not on file  . Highest education level: Not on file  Occupational History  . Not on file    Social Needs  . Financial resource strain: Not on file  . Food insecurity:    Worry: Not on file    Inability: Not on file  . Transportation needs:    Medical: Not on file    Non-medical: Not on file  Tobacco Use  . Smoking status: Never Smoker  . Smokeless tobacco: Never Used  Substance and Sexual Activity  . Alcohol use: No  . Drug use: No  . Sexual activity: Not on file  Lifestyle  . Physical activity:    Days per week: Not on file    Minutes per session: Not on file  . Stress: Not on file  Relationships  . Social connections:    Talks on phone: Not on file    Gets together: Not on file    Attends religious service: Not on file    Active member of club or organization: Not on file    Attends meetings of clubs or organizations: Not on file    Relationship status: Not on file  . Intimate partner violence:    Fear of current or ex partner: Not on file    Emotionally abused: Not on file    Physically abused: Not on file    Forced sexual activity: Not on file  Other Topics Concern  . Not on file  Social History Narrative  . Not on file      Family History  Problem Relation Age of Onset  . Heart failure Mother   . Cancer Father    Vitals:   09/11/17 1049  BP: (!) 129/93  Pulse: 73  SpO2: 99%  Weight: 161 lb 8 oz (73.3 kg)     Wt Readings from Last 3 Encounters:  09/11/17 161 lb 8 oz (73.3 kg)  08/13/17 158 lb 9.6 oz (71.9 kg)  07/30/17 156 lb 6.4 oz (70.9 kg)    PHYSICAL EXAM: General: NAD Neck: JVP 9-10 cm, no thyromegaly or thyroid nodule.  Lungs: Clear to auscultation bilaterally with normal respiratory effort. CV: Nondisplaced PMI.  Heart regular S1/S2, +S3, 3/6 HSM apex.  Trace ankle edema.  No carotid bruit, normal pedal pulses.  Abdomen: Soft, nontender, no hepatosplenomegaly, no distention.  Skin: Intact without lesions or rashes.  Neurologic: Alert and oriented x 3.  Psych: Normal affect. Extremities: No clubbing or cyanosis.  HEENT: Normal.    ASSESSMENT & PLAN:  1. Chronic systolic CHF: Long-standing NICM, 5/05 cath without significant CAD. Echo in 5/17 with EF 20%, echo in 1/19 with EF 10-15%, prominent dyssynchrony, moderate-severe MR, PASP 91 (but ?if this represents VSD flow rather than TR). Complicated by CKD stage 4. Has MDT CRT-D device.  Optivol does not suggest volume overload today, but she is volume overloaded on exam with weight gain and S3. NYHA  class II-III symptoms.  Mitral regurgitation may be potentiating her symptoms/volume overload.  - Increase torsemide to 80 mg daily alternating with 60 mg daily.  BMET today and in 10 days.  - Continue Toprol XL 25 mg daily.  - No ACEI/ARB/ARNI: has failed in the past with AKI.  - Increase hydralazine to 25 mg tid + continue Imdur 30 daily.  - Most recent creatinine contraindication for spironolactone. BMET today.  - In 1/19, she underwent V-V optimization per EP.  2. Atrial fibrillation: Paroxysmal. Maintaining NSR.  - Continue amiodarone. Check LFTs/TSH today, will need regular eye exams.  - Continue warfarin per pharmacy.   3. CKD: Stage 4.  Baseline creatinine appears to be 2.0 - 2.4. Follow carefully, check BMET today and again in 10 days.   4. H/o VSD: likely restrictive/small. Suspect what was read as elevated PA pressure on last echo represents VSD jet rather than TR jet.  5. Mitral regurgitation: Moderate-severe central MR.  Functional.  She remains symptomatic and medication titration has been difficult with low BP and CKD.  I would consider her for mitral valve clip if MR is truly severe.  - I will arrange for TEE to fully evaluate the mitral valve.  We discussed risks/benefits of procedure, she agrees but wants to arrange with her daughter so may be in a few weeks.   Loralie Champagne, MD 09/11/17

## 2017-09-19 ENCOUNTER — Ambulatory Visit (HOSPITAL_COMMUNITY)
Admission: RE | Admit: 2017-09-19 | Discharge: 2017-09-19 | Disposition: A | Discharge status: Home / Self Care | Payer: Medicare Other | Source: Ambulatory Visit | Attending: Internal Medicine | Admitting: Internal Medicine

## 2017-09-19 DIAGNOSIS — Q210 Ventricular septal defect: Secondary | ICD-10-CM | POA: Diagnosis not present

## 2017-09-19 DIAGNOSIS — I472 Ventricular tachycardia: Secondary | ICD-10-CM | POA: Diagnosis not present

## 2017-09-19 DIAGNOSIS — E668 Other obesity: Secondary | ICD-10-CM | POA: Diagnosis not present

## 2017-09-19 DIAGNOSIS — R0600 Dyspnea, unspecified: Secondary | ICD-10-CM | POA: Diagnosis not present

## 2017-09-19 DIAGNOSIS — E039 Hypothyroidism, unspecified: Secondary | ICD-10-CM | POA: Diagnosis not present

## 2017-09-19 DIAGNOSIS — I428 Other cardiomyopathies: Secondary | ICD-10-CM | POA: Diagnosis not present

## 2017-09-19 DIAGNOSIS — R0602 Shortness of breath: Secondary | ICD-10-CM | POA: Diagnosis not present

## 2017-09-19 DIAGNOSIS — Z95810 Presence of automatic (implantable) cardiac defibrillator: Secondary | ICD-10-CM | POA: Diagnosis not present

## 2017-09-19 DIAGNOSIS — Z7901 Long term (current) use of anticoagulants: Secondary | ICD-10-CM | POA: Diagnosis not present

## 2017-09-19 DIAGNOSIS — I5022 Chronic systolic (congestive) heart failure: Secondary | ICD-10-CM | POA: Diagnosis not present

## 2017-09-19 DIAGNOSIS — I700 Atherosclerosis of aorta: Secondary | ICD-10-CM | POA: Diagnosis not present

## 2017-09-19 DIAGNOSIS — N183 Chronic kidney disease, stage 3 (moderate): Secondary | ICD-10-CM | POA: Diagnosis not present

## 2017-09-19 LAB — BASIC METABOLIC PANEL
Anion gap: 13 (ref 5–15)
BUN: 59 mg/dL — AB (ref 6–20)
CHLORIDE: 105 mmol/L (ref 101–111)
CO2: 22 mmol/L (ref 22–32)
CREATININE: 3.36 mg/dL — AB (ref 0.44–1.00)
Calcium: 9.9 mg/dL (ref 8.9–10.3)
GFR calc Af Amer: 14 mL/min — ABNORMAL LOW (ref >60)
GFR calc non Af Amer: 12 mL/min — ABNORMAL LOW (ref >60)
GLUCOSE: 126 mg/dL — AB (ref 65–99)
Potassium: 4.5 mmol/L (ref 3.5–5.1)
SODIUM: 140 mmol/L (ref 135–145)

## 2017-09-21 ENCOUNTER — Telehealth (HOSPITAL_COMMUNITY): Payer: Self-pay

## 2017-09-21 ENCOUNTER — Other Ambulatory Visit (HOSPITAL_COMMUNITY): Payer: Medicare Other

## 2017-09-21 DIAGNOSIS — I5022 Chronic systolic (congestive) heart failure: Secondary | ICD-10-CM

## 2017-09-21 MED ORDER — TORSEMIDE 20 MG PO TABS: 60.0000 mg | ORAL_TABLET | Freq: Every day | ORAL | 3 refills | Status: DC

## 2017-09-21 NOTE — Telephone Encounter (Signed)
Notes recorded by Shirley Muscat, RN on 09/21/2017 at 9:51 AM EDT Pt aware of results, agreeable to med changes (changes made in [facility]), and Lab appointment made (orders placed)   ------  Notes recorded by Larey Dresser, MD on 09/21/2017 at 9:38 AM EDT Decrease torsemide to 60 mg daily (not alternating with 80 mg) and repeat BMET 1 week.

## 2017-09-25 DIAGNOSIS — R809 Proteinuria, unspecified: Secondary | ICD-10-CM | POA: Diagnosis not present

## 2017-09-25 DIAGNOSIS — R7989 Other specified abnormal findings of blood chemistry: Secondary | ICD-10-CM | POA: Diagnosis not present

## 2017-09-25 DIAGNOSIS — D631 Anemia in chronic kidney disease: Secondary | ICD-10-CM | POA: Diagnosis not present

## 2017-09-25 DIAGNOSIS — N2581 Secondary hyperparathyroidism of renal origin: Secondary | ICD-10-CM | POA: Diagnosis not present

## 2017-09-25 DIAGNOSIS — Z6831 Body mass index (BMI) 31.0-31.9, adult: Secondary | ICD-10-CM | POA: Diagnosis not present

## 2017-09-25 DIAGNOSIS — N183 Chronic kidney disease, stage 3 (moderate): Secondary | ICD-10-CM | POA: Diagnosis not present

## 2017-09-28 ENCOUNTER — Other Ambulatory Visit (HOSPITAL_COMMUNITY): Payer: Medicare Other

## 2017-10-01 ENCOUNTER — Ambulatory Visit (HOSPITAL_COMMUNITY)
Admission: RE | Admit: 2017-10-01 | Discharge: 2017-10-01 | Disposition: A | Discharge status: Home / Self Care | Payer: Medicare Other | Source: Ambulatory Visit | Attending: Internal Medicine | Admitting: Internal Medicine

## 2017-10-01 DIAGNOSIS — I5022 Chronic systolic (congestive) heart failure: Secondary | ICD-10-CM | POA: Insufficient documentation

## 2017-10-01 LAB — BASIC METABOLIC PANEL
ANION GAP: 12 (ref 5–15)
BUN: 49 mg/dL — AB (ref 6–20)
CALCIUM: 9.5 mg/dL (ref 8.9–10.3)
CO2: 21 mmol/L — ABNORMAL LOW (ref 22–32)
CREATININE: 2.95 mg/dL — AB (ref 0.44–1.00)
Chloride: 108 mmol/L (ref 101–111)
GFR calc Af Amer: 16 mL/min — ABNORMAL LOW (ref >60)
GFR, EST NON AFRICAN AMERICAN: 14 mL/min — AB (ref >60)
Glucose, Bld: 91 mg/dL (ref 65–99)
Potassium: 4.1 mmol/L (ref 3.5–5.1)
Sodium: 141 mmol/L (ref 135–145)

## 2017-10-03 DIAGNOSIS — I472 Ventricular tachycardia: Secondary | ICD-10-CM | POA: Diagnosis not present

## 2017-10-03 DIAGNOSIS — R0600 Dyspnea, unspecified: Secondary | ICD-10-CM | POA: Diagnosis not present

## 2017-10-03 DIAGNOSIS — I428 Other cardiomyopathies: Secondary | ICD-10-CM | POA: Diagnosis not present

## 2017-10-03 DIAGNOSIS — Q210 Ventricular septal defect: Secondary | ICD-10-CM | POA: Diagnosis not present

## 2017-10-03 DIAGNOSIS — E039 Hypothyroidism, unspecified: Secondary | ICD-10-CM | POA: Diagnosis not present

## 2017-10-03 DIAGNOSIS — Z7901 Long term (current) use of anticoagulants: Secondary | ICD-10-CM | POA: Diagnosis not present

## 2017-10-03 DIAGNOSIS — R0602 Shortness of breath: Secondary | ICD-10-CM | POA: Diagnosis not present

## 2017-10-03 DIAGNOSIS — E668 Other obesity: Secondary | ICD-10-CM | POA: Diagnosis not present

## 2017-10-03 DIAGNOSIS — N183 Chronic kidney disease, stage 3 (moderate): Secondary | ICD-10-CM | POA: Diagnosis not present

## 2017-10-03 DIAGNOSIS — Z95810 Presence of automatic (implantable) cardiac defibrillator: Secondary | ICD-10-CM | POA: Diagnosis not present

## 2017-10-03 DIAGNOSIS — I5022 Chronic systolic (congestive) heart failure: Secondary | ICD-10-CM | POA: Diagnosis not present

## 2017-10-03 DIAGNOSIS — I700 Atherosclerosis of aorta: Secondary | ICD-10-CM | POA: Diagnosis not present

## 2017-10-15 DIAGNOSIS — N184 Chronic kidney disease, stage 4 (severe): Secondary | ICD-10-CM | POA: Diagnosis not present

## 2017-10-17 DIAGNOSIS — I428 Other cardiomyopathies: Secondary | ICD-10-CM | POA: Diagnosis not present

## 2017-10-17 DIAGNOSIS — I700 Atherosclerosis of aorta: Secondary | ICD-10-CM | POA: Diagnosis not present

## 2017-10-17 DIAGNOSIS — R0602 Shortness of breath: Secondary | ICD-10-CM | POA: Diagnosis not present

## 2017-10-17 DIAGNOSIS — Z7901 Long term (current) use of anticoagulants: Secondary | ICD-10-CM | POA: Diagnosis not present

## 2017-10-17 DIAGNOSIS — Z95810 Presence of automatic (implantable) cardiac defibrillator: Secondary | ICD-10-CM | POA: Diagnosis not present

## 2017-10-17 DIAGNOSIS — N183 Chronic kidney disease, stage 3 (moderate): Secondary | ICD-10-CM | POA: Diagnosis not present

## 2017-10-17 DIAGNOSIS — E668 Other obesity: Secondary | ICD-10-CM | POA: Diagnosis not present

## 2017-10-17 DIAGNOSIS — E039 Hypothyroidism, unspecified: Secondary | ICD-10-CM | POA: Diagnosis not present

## 2017-10-17 DIAGNOSIS — I5022 Chronic systolic (congestive) heart failure: Secondary | ICD-10-CM | POA: Diagnosis not present

## 2017-10-17 DIAGNOSIS — R0600 Dyspnea, unspecified: Secondary | ICD-10-CM | POA: Diagnosis not present

## 2017-10-17 DIAGNOSIS — I472 Ventricular tachycardia: Secondary | ICD-10-CM | POA: Diagnosis not present

## 2017-10-17 DIAGNOSIS — Q210 Ventricular septal defect: Secondary | ICD-10-CM | POA: Diagnosis not present

## 2017-10-19 ENCOUNTER — Encounter: Payer: Self-pay | Admitting: Internal Medicine

## 2017-10-19 ENCOUNTER — Ambulatory Visit (INDEPENDENT_AMBULATORY_CARE_PROVIDER_SITE_OTHER): Payer: Medicare Other | Admitting: Internal Medicine

## 2017-10-19 ENCOUNTER — Other Ambulatory Visit: Payer: Self-pay | Admitting: Internal Medicine

## 2017-10-19 VITALS — BP 122/88 | HR 76 | Ht 62.0 in | Wt 162.0 lb

## 2017-10-19 DIAGNOSIS — I509 Heart failure, unspecified: Secondary | ICD-10-CM

## 2017-10-19 DIAGNOSIS — H903 Sensorineural hearing loss, bilateral: Secondary | ICD-10-CM

## 2017-10-19 DIAGNOSIS — I10 Essential (primary) hypertension: Secondary | ICD-10-CM

## 2017-10-19 DIAGNOSIS — Z1231 Encounter for screening mammogram for malignant neoplasm of breast: Secondary | ICD-10-CM

## 2017-10-19 DIAGNOSIS — E7849 Other hyperlipidemia: Secondary | ICD-10-CM

## 2017-10-19 DIAGNOSIS — Q210 Ventricular septal defect: Secondary | ICD-10-CM

## 2017-10-19 DIAGNOSIS — E039 Hypothyroidism, unspecified: Secondary | ICD-10-CM

## 2017-10-19 NOTE — Patient Instructions (Signed)
TSH, CBC with differential and basic metabolic panel checked today.  She is on amiodarone.  Scheduled for TEE next week.

## 2017-10-19 NOTE — Progress Notes (Deleted)
   Subjective:    Patient ID: Casey [name], female    DOB: 02/06/1935, 83 y.o.   MRN: [id number]  HPI She was admitted in January for acute on chronic systolic heart failure.  She has a history of paroxysmal atrial fibrillation.  She is on warfarin.  She is on amiodarone.  She has chronic kidney disease stage IV which is stable.  History of VSD that is small.  She has a history of moderately severe mitral regurgitation.  History of gout.  History of hypothyroidism.  Casey [name] is to get blood pressure to 1  She resides alone and does her own housework.  Husband is in a nursing home with severe COPD.  She is followed by Dr. Aundra Dubin and Dr. Wynonia Lawman  Her creatinine is 2.75 and previously a month ago was 2.95.  TSH is stable.  She continues to drive.    Review of Systems see above     Objective:   Physical Exam Skin warm and dry.  Nodes none.  Neck is supple.  No carotid bruits.  Chest clear to auscultation.       Assessment & Plan:

## 2017-10-19 NOTE — Progress Notes (Signed)
   Subjective:    Patient ID: Casey [name], female    DOB: [birth year], 83 y.o.   MRN: [id number]  HPI Patient was hospitalized in January with congestive heart failure.  Long-standing history of nonischemic cardiomyopathy.  She has recovered from CHF episode and has returned to driving.  She is scheduled to have a TEE this coming Tuesday.  She has stage IV chronic kidney disease and is followed by Nephrology.  She has hypothyroidism.  She is on amiodarone.  We are checking TSH today.  In anticipation of TEE were also had a check CBC and B- met.  She has history of VSD and mitral regurgitation as well as PAF.  She has a biventricular defibrillator and is on chronic Coumadin therapy.  Echo showed moderate to severe mitral regurgitation with an ejection fraction of 10 to 15% in late January.  Patient asking if someone should drive her to and from hospital for TEE and told her yes.    Review of Systems     Objective:   Physical Exam Neck is supple.  Chest clear.  Cardiac exam regular rate and rhythm.  Extremities without edema.  Labs drawn and pending.  She is hard of hearing but alert.       Assessment & Plan:  History of congestive heart failure on amiodarone therapy  VSD  Nonischemic cardiomyopathy  Hypothyroidism  Essential hypertension  Bilateral hearing loss  Chronic kidney disease stage IV  Plan: TEE next week.  Labs drawn including TSH.  Physical exam due January 2020

## 2017-10-20 LAB — CBC WITH DIFFERENTIAL/PLATELET
BASOS ABS: 71 {cells}/uL (ref 0–200)
Basophils Relative: 1.2 %
EOS ABS: 189 {cells}/uL (ref 15–500)
Eosinophils Relative: 3.2 %
HEMATOCRIT: 42.2 % (ref 35.0–45.0)
Hemoglobin: 14.8 g/dL (ref 11.7–15.5)
LYMPHS ABS: 1333 {cells}/uL (ref 850–3900)
MCH: 34.3 pg — AB (ref 27.0–33.0)
MCHC: 35.1 g/dL (ref 32.0–36.0)
MCV: 97.9 fL (ref 80.0–100.0)
MPV: 11.8 fL (ref 7.5–12.5)
Monocytes Relative: 9.8 %
NEUTROS ABS: 3729 {cells}/uL (ref 1500–7800)
Neutrophils Relative %: 63.2 %
Platelets: 180 10*3/uL (ref 140–400)
RBC: 4.31 10*6/uL (ref 3.80–5.10)
RDW: 11.7 % (ref 11.0–15.0)
Total Lymphocyte: 22.6 %
WBC: 5.9 10*3/uL (ref 3.8–10.8)
WBCMIX: 578 {cells}/uL (ref 200–950)

## 2017-10-20 LAB — BASIC METABOLIC PANEL
BUN/Creatinine Ratio: 17 (calc) (ref 6–22)
BUN: 48 mg/dL — ABNORMAL HIGH (ref 7–25)
CALCIUM: 9.8 mg/dL (ref 8.6–10.4)
CO2: 25 mmol/L (ref 20–32)
Chloride: 107 mmol/L (ref 98–110)
Creat: 2.75 mg/dL — ABNORMAL HIGH (ref 0.60–0.88)
Glucose, Bld: 115 mg/dL — ABNORMAL HIGH (ref 65–99)
POTASSIUM: 4.4 mmol/L (ref 3.5–5.3)
Sodium: 144 mmol/L (ref 135–146)

## 2017-10-20 LAB — TSH: TSH: 0.84 mIU/L (ref 0.40–4.50)

## 2017-10-23 ENCOUNTER — Encounter (HOSPITAL_COMMUNITY): Payer: Self-pay

## 2017-10-23 ENCOUNTER — Other Ambulatory Visit (HOSPITAL_COMMUNITY): Payer: Self-pay

## 2017-10-23 ENCOUNTER — Ambulatory Visit (HOSPITAL_COMMUNITY)
Admission: RE | Admit: 2017-10-23 | Discharge: 2017-10-23 | Disposition: A | Discharge status: Home / Self Care | Payer: Medicare Other | Source: Ambulatory Visit | Attending: Cardiology | Admitting: Cardiology

## 2017-10-23 ENCOUNTER — Encounter (HOSPITAL_COMMUNITY): Payer: Self-pay | Admitting: Cardiology

## 2017-10-23 ENCOUNTER — Other Ambulatory Visit: Payer: Self-pay

## 2017-10-23 VITALS — BP 143/79 | HR 77 | Wt 161.2 lb

## 2017-10-23 DIAGNOSIS — I5022 Chronic systolic (congestive) heart failure: Secondary | ICD-10-CM

## 2017-10-23 DIAGNOSIS — Z79899 Other long term (current) drug therapy: Secondary | ICD-10-CM | POA: Diagnosis not present

## 2017-10-23 DIAGNOSIS — E039 Hypothyroidism, unspecified: Secondary | ICD-10-CM | POA: Insufficient documentation

## 2017-10-23 DIAGNOSIS — I340 Nonrheumatic mitral (valve) insufficiency: Secondary | ICD-10-CM

## 2017-10-23 DIAGNOSIS — Z8711 Personal history of peptic ulcer disease: Secondary | ICD-10-CM | POA: Insufficient documentation

## 2017-10-23 DIAGNOSIS — I480 Paroxysmal atrial fibrillation: Secondary | ICD-10-CM | POA: Diagnosis not present

## 2017-10-23 DIAGNOSIS — I428 Other cardiomyopathies: Secondary | ICD-10-CM | POA: Diagnosis not present

## 2017-10-23 DIAGNOSIS — Z7901 Long term (current) use of anticoagulants: Secondary | ICD-10-CM | POA: Diagnosis not present

## 2017-10-23 DIAGNOSIS — M109 Gout, unspecified: Secondary | ICD-10-CM | POA: Diagnosis not present

## 2017-10-23 DIAGNOSIS — Z885 Allergy status to narcotic agent status: Secondary | ICD-10-CM | POA: Diagnosis not present

## 2017-10-23 DIAGNOSIS — N184 Chronic kidney disease, stage 4 (severe): Secondary | ICD-10-CM | POA: Diagnosis not present

## 2017-10-23 MED ORDER — METOPROLOL SUCCINATE ER 25 MG PO TB24: 37.5000 mg | ORAL_TABLET | Freq: Every day | ORAL | 6 refills | Status: DC

## 2017-10-23 MED ORDER — HYDRALAZINE HCL 25 MG PO TABS: 37.5000 mg | ORAL_TABLET | Freq: Three times a day (TID) | ORAL | 3 refills | Status: DC

## 2017-10-23 NOTE — Progress Notes (Signed)
Advanced Heart Failure Clinic Note   Referring Physician: PCP: Elby Showers, MD PCP-Cardiologist: Ezzard Standing, MD  HF Cardiology: Dr. Aundra Dubin  HPI:  Casey [name] is a 83 y.o. female with long history of nonischemic cardiomyopathy, now CKD stage 3-4. She had cath in 5/05 with normal coronaries. She has MDT CRT-D device. Not on ACEI/ARB/ARNI due to renal dysfunction (tried all in the past). On warfarin with history of paroxysmal atrial fibrillation. Echo in 5/17 with EF 20%, moderate MR, moderate TR, small VSD.   Admitted 07/06/17 with increase SOB and decreased urine output. Echo with EF 10-15%, moderate-severe MR.  Diuresed with IV lasix and metolazone and lost 7 lbs with improvement in symptoms. Transitioned from last to torsemide. Meds optimized but limited due to CKD IV.   She returns for followup of CHF.  She has been doing fairly well recently. Breathing is better, no dyspnea walking on flat ground. She can walk for about a block without problems.  Weight is stable.  No orthopnea/PND, sleeping well.  No lightheadedness.   Labs (3/19): K 3.9, creatinine 2.6, hgb 15.3 Labs (4/19): LFTs normal Labs (5/19): K 4.9, creatinine 2.75, TSH normal   Review of systems complete and found to be negative unless listed in HPI.    Past Medical History 1. Chronic systolic CHF: Long-standing nonischemic cardiomyopathy, 5/05 cath without significant CAD. Echo in 5/17 with EF 20%.  - Echo 1/19 with EF 10-15%, prominent dyssynchrony, moderate-severe MR, PASP 91 (but ?if this represents VSD flow rather than TR).  - MDT CRT-D device.  2. Atrial fibrillation: Paroxysmal. She is on amiodarone and warfarin.  3. CKD: Stage 4. Follows with nephrology (Dr. Justin Mend).  4. H/o VSD:  Small/restrictive.  5. Mitral regurgitation: Moderate-severe central MR on 1/19 echo, likely functional.   6. Gout 7. Hypothyroidism 8. PUD  Current Outpatient Medications  Medication Sig Dispense Refill  .  allopurinol (ZYLOPRIM) 100 MG tablet TAKE 1 TABLET BY MOUTH EVERY DAY 30 tablet 11  . amiodarone (PACERONE) 200 MG tablet Take 200 mg by mouth daily.    . brinzolamide (AZOPT) 1 % ophthalmic suspension Place 1 drop into both eyes 3 (three) times daily.    . dorzolamide (TRUSOPT) 2 % ophthalmic solution Place 1 drop into both eyes 3 (three) times daily.    . hydrALAZINE (APRESOLINE) 25 MG tablet Take 1.5 tablets (37.5 mg total) by mouth 3 (three) times daily. 115 tablet 3  . isosorbide mononitrate (IMDUR) 30 MG 24 hr tablet Take 1 tablet (30 mg total) by mouth daily. 30 tablet 6  . Latanoprost 0.005 % EMUL Apply 1 drop to eye at bedtime.    Marland Kitchen levothyroxine (SYNTHROID, LEVOTHROID) 75 MCG tablet Take 75 mcg by mouth daily before breakfast.     . metoprolol succinate (TOPROL-XL) 25 MG 24 hr tablet Take 1.5 tablets (37.5 mg total) by mouth daily. 45 tablet 6  . Multiple Vitamins-Calcium (ONE-A-DAY WOMENS FORMULA) TABS Take 1 tablet by mouth daily.      . potassium chloride SA (K-DUR,KLOR-CON) 20 MEQ tablet Take 20 mEq by mouth daily.     Marland Kitchen torsemide (DEMADEX) 20 MG tablet Take 3 tablets (60 mg total) by mouth daily. 90 tablet 3  . vitamin D, CHOLECALCIFEROL, 400 UNITS tablet Take 800 Units by mouth daily.    Marland Kitchen warfarin (COUMADIN) 2 MG tablet Take 1 tablet (2 mg total) by mouth daily. (Patient taking differently: Take 2 mg by mouth daily. On wednesdays she takes 2 1/2mg )  30 tablet 6   No current facility-administered medications for this encounter.     Allergies  Allergen Reactions  . Codeine     Unknown      Social History   Socioeconomic History  . Marital status: Married    Spouse name: Not on file  . Number of children: Not on file  . Years of education: Not on file  . Highest education level: Not on file  Occupational History  . Not on file  Social Needs  . Financial resource strain: Not on file  . Food insecurity:    Worry: Not on file    Inability: Not on file  .  Transportation needs:    Medical: Not on file    Non-medical: Not on file  Tobacco Use  . Smoking status: Never Smoker  . Smokeless tobacco: Never Used  Substance and Sexual Activity  . Alcohol use: No  . Drug use: No  . Sexual activity: Not on file  Lifestyle  . Physical activity:    Days per week: Not on file    Minutes per session: Not on file  . Stress: Not on file  Relationships  . Social connections:    Talks on phone: Not on file    Gets together: Not on file    Attends religious service: Not on file    Active member of club or organization: Not on file    Attends meetings of clubs or organizations: Not on file    Relationship status: Not on file  . Intimate partner violence:    Fear of current or ex partner: Not on file    Emotionally abused: Not on file    Physically abused: Not on file    Forced sexual activity: Not on file  Other Topics Concern  . Not on file  Social History Narrative  . Not on file      Family History  Problem Relation Age of Onset  . Heart failure Mother   . Cancer Father    Vitals:   10/23/17 0939  BP: (!) 143/79  Pulse: 77  SpO2: 96%  Weight: 161 lb 4 oz (73.1 kg)     Wt Readings from Last 3 Encounters:  10/23/17 161 lb 4 oz (73.1 kg)  10/19/17 162 lb (73.5 kg)  09/11/17 161 lb 8 oz (73.3 kg)    PHYSICAL EXAM: General: NAD Neck: JVP 8 cm, no thyromegaly or thyroid nodule.  Lungs: Clear to auscultation bilaterally with normal respiratory effort. CV: Nondisplaced PMI.  Heart regular S1/S2, no S3/S4, 3/6 HSM apex.  No peripheral edema.  No carotid bruit.  Normal pedal pulses.  Abdomen: Soft, nontender, no hepatosplenomegaly, no distention.  Skin: Intact without lesions or rashes.  Neurologic: Alert and oriented x 3.  Psych: Normal affect. Extremities: No clubbing or cyanosis.  HEENT: Normal.   ASSESSMENT & PLAN:  1. Chronic systolic CHF: Long-standing NICM, 5/05 cath without significant CAD. Echo in 5/17 with EF 20%,  echo in 1/19 with EF 10-15%, prominent dyssynchrony, moderate-severe MR, PASP 91 (but ?if this represents VSD flow rather than TR). Complicated by CKD stage 4. Has MDT CRT-D device. In 1/19, she underwent V-V optimization per EP.  Mitral regurgitation may be potentiating her symptoms/volume overload.  Today, she does not appear significantly volume overloaded and weight is stable.  NYHA class II symptoms, some improvement.  - Continue torsemide 60 mg daily.  - Increase Toprol XL to 37.5 mg daily.   - No ACEI/ARB/ARNI:  has failed in the past with AKI.  - Increase hydralazine to 37.5 mg tid + continue Imdur 30 daily.  - Most recent creatinine contraindication for spironolactone.  2. Atrial fibrillation: Paroxysmal. Maintaining NSR.  - Continue amiodarone. Recent LFTs/TSH normal.  She will need regular eye exams.  - Continue warfarin per pharmacy.   3. CKD: Stage 4.     4. H/o VSD: likely restrictive/small. Suspect what was read as elevated PA pressure on last echo represents VSD jet rather than TR jet.  5. Mitral regurgitation: Moderate-severe central MR on last echo.  Functional.  She remains symptomatic and medication titration has been difficult with low BP and CKD.  I would consider her for mitral valve clip if MR is truly severe.  - I will arrange for TEE to fully evaluate the mitral valve.  We discussed risks/benefits of procedure and she agrees to have it done, will arrange for next week.   Followup in 6 wks.   Loralie Champagne, MD 10/23/17

## 2017-10-23 NOTE — H&P (View-Only) (Signed)
Advanced Heart Failure Clinic Note   Referring Physician: PCP: Elby Showers, MD PCP-Cardiologist: Ezzard Standing, MD  HF Cardiology: Dr. Aundra Dubin  HPI:  Casey [name] is a 83 y.o. female with long history of nonischemic cardiomyopathy, now CKD stage 3-4. She had cath in 5/05 with normal coronaries. She has MDT CRT-D device. Not on ACEI/ARB/ARNI due to renal dysfunction (tried all in the past). On warfarin with history of paroxysmal atrial fibrillation. Echo in 5/17 with EF 20%, moderate MR, moderate TR, small VSD.   Admitted 07/06/17 with increase SOB and decreased urine output. Echo with EF 10-15%, moderate-severe MR.  Diuresed with IV lasix and metolazone and lost 7 lbs with improvement in symptoms. Transitioned from last to torsemide. Meds optimized but limited due to CKD IV.   She returns for followup of CHF.  She has been doing fairly well recently. Breathing is better, no dyspnea walking on flat ground. She can walk for about a block without problems.  Weight is stable.  No orthopnea/PND, sleeping well.  No lightheadedness.   Labs (3/19): K 3.9, creatinine 2.6, hgb 15.3 Labs (4/19): LFTs normal Labs (5/19): K 4.9, creatinine 2.75, TSH normal   Review of systems complete and found to be negative unless listed in HPI.    Past Medical History 1. Chronic systolic CHF: Long-standing nonischemic cardiomyopathy, 5/05 cath without significant CAD. Echo in 5/17 with EF 20%.  - Echo 1/19 with EF 10-15%, prominent dyssynchrony, moderate-severe MR, PASP 91 (but ?if this represents VSD flow rather than TR).  - MDT CRT-D device.  2. Atrial fibrillation: Paroxysmal. She is on amiodarone and warfarin.  3. CKD: Stage 4. Follows with nephrology (Dr. Justin Mend).  4. H/o VSD:  Small/restrictive.  5. Mitral regurgitation: Moderate-severe central MR on 1/19 echo, likely functional.   6. Gout 7. Hypothyroidism 8. PUD  Current Outpatient Medications  Medication Sig Dispense Refill  .  allopurinol (ZYLOPRIM) 100 MG tablet TAKE 1 TABLET BY MOUTH EVERY DAY 30 tablet 11  . amiodarone (PACERONE) 200 MG tablet Take 200 mg by mouth daily.    . brinzolamide (AZOPT) 1 % ophthalmic suspension Place 1 drop into both eyes 3 (three) times daily.    . dorzolamide (TRUSOPT) 2 % ophthalmic solution Place 1 drop into both eyes 3 (three) times daily.    . hydrALAZINE (APRESOLINE) 25 MG tablet Take 1.5 tablets (37.5 mg total) by mouth 3 (three) times daily. 115 tablet 3  . isosorbide mononitrate (IMDUR) 30 MG 24 hr tablet Take 1 tablet (30 mg total) by mouth daily. 30 tablet 6  . Latanoprost 0.005 % EMUL Apply 1 drop to eye at bedtime.    Marland Kitchen levothyroxine (SYNTHROID, LEVOTHROID) 75 MCG tablet Take 75 mcg by mouth daily before breakfast.     . metoprolol succinate (TOPROL-XL) 25 MG 24 hr tablet Take 1.5 tablets (37.5 mg total) by mouth daily. 45 tablet 6  . Multiple Vitamins-Calcium (ONE-A-DAY WOMENS FORMULA) TABS Take 1 tablet by mouth daily.      . potassium chloride SA (K-DUR,KLOR-CON) 20 MEQ tablet Take 20 mEq by mouth daily.     Marland Kitchen torsemide (DEMADEX) 20 MG tablet Take 3 tablets (60 mg total) by mouth daily. 90 tablet 3  . vitamin D, CHOLECALCIFEROL, 400 UNITS tablet Take 800 Units by mouth daily.    Marland Kitchen warfarin (COUMADIN) 2 MG tablet Take 1 tablet (2 mg total) by mouth daily. (Patient taking differently: Take 2 mg by mouth daily. On wednesdays she takes 2 1/2mg )  30 tablet 6   No current facility-administered medications for this encounter.     Allergies  Allergen Reactions  . Codeine     Unknown      Social History   Socioeconomic History  . Marital status: Married    Spouse name: Not on file  . Number of children: Not on file  . Years of education: Not on file  . Highest education level: Not on file  Occupational History  . Not on file  Social Needs  . Financial resource strain: Not on file  . Food insecurity:    Worry: Not on file    Inability: Not on file  .  Transportation needs:    Medical: Not on file    Non-medical: Not on file  Tobacco Use  . Smoking status: Never Smoker  . Smokeless tobacco: Never Used  Substance and Sexual Activity  . Alcohol use: No  . Drug use: No  . Sexual activity: Not on file  Lifestyle  . Physical activity:    Days per week: Not on file    Minutes per session: Not on file  . Stress: Not on file  Relationships  . Social connections:    Talks on phone: Not on file    Gets together: Not on file    Attends religious service: Not on file    Active member of club or organization: Not on file    Attends meetings of clubs or organizations: Not on file    Relationship status: Not on file  . Intimate partner violence:    Fear of current or ex partner: Not on file    Emotionally abused: Not on file    Physically abused: Not on file    Forced sexual activity: Not on file  Other Topics Concern  . Not on file  Social History Narrative  . Not on file      Family History  Problem Relation Age of Onset  . Heart failure Mother   . Cancer Father    Vitals:   10/23/17 0939  BP: (!) 143/79  Pulse: 77  SpO2: 96%  Weight: 161 lb 4 oz (73.1 kg)     Wt Readings from Last 3 Encounters:  10/23/17 161 lb 4 oz (73.1 kg)  10/19/17 162 lb (73.5 kg)  09/11/17 161 lb 8 oz (73.3 kg)    PHYSICAL EXAM: General: NAD Neck: JVP 8 cm, no thyromegaly or thyroid nodule.  Lungs: Clear to auscultation bilaterally with normal respiratory effort. CV: Nondisplaced PMI.  Heart regular S1/S2, no S3/S4, 3/6 HSM apex.  No peripheral edema.  No carotid bruit.  Normal pedal pulses.  Abdomen: Soft, nontender, no hepatosplenomegaly, no distention.  Skin: Intact without lesions or rashes.  Neurologic: Alert and oriented x 3.  Psych: Normal affect. Extremities: No clubbing or cyanosis.  HEENT: Normal.   ASSESSMENT & PLAN:  1. Chronic systolic CHF: Long-standing NICM, 5/05 cath without significant CAD. Echo in 5/17 with EF 20%,  echo in 1/19 with EF 10-15%, prominent dyssynchrony, moderate-severe MR, PASP 91 (but ?if this represents VSD flow rather than TR). Complicated by CKD stage 4. Has MDT CRT-D device. In 1/19, she underwent V-V optimization per EP.  Mitral regurgitation may be potentiating her symptoms/volume overload.  Today, she does not appear significantly volume overloaded and weight is stable.  NYHA class II symptoms, some improvement.  - Continue torsemide 60 mg daily.  - Increase Toprol XL to 37.5 mg daily.   - No ACEI/ARB/ARNI:  has failed in the past with AKI.  - Increase hydralazine to 37.5 mg tid + continue Imdur 30 daily.  - Most recent creatinine contraindication for spironolactone.  2. Atrial fibrillation: Paroxysmal. Maintaining NSR.  - Continue amiodarone. Recent LFTs/TSH normal.  She will need regular eye exams.  - Continue warfarin per pharmacy.   3. CKD: Stage 4.     4. H/o VSD: likely restrictive/small. Suspect what was read as elevated PA pressure on last echo represents VSD jet rather than TR jet.  5. Mitral regurgitation: Moderate-severe central MR on last echo.  Functional.  She remains symptomatic and medication titration has been difficult with low BP and CKD.  I would consider her for mitral valve clip if MR is truly severe.  - I will arrange for TEE to fully evaluate the mitral valve.  We discussed risks/benefits of procedure and she agrees to have it done, will arrange for next week.   Followup in 6 wks.   Loralie Champagne, MD 10/23/17

## 2017-10-23 NOTE — Patient Instructions (Addendum)
Increase Toprol XL to 37.5 mg (1.5 tabs) daily  Increase Hydralazine 37.5 mg (1.5 tabs), three times a day  Your physician has requested that you have a TEE. During a TEE, sound waves are used to create images of your heart. It provides your doctor with information about the size and shape of your heart and how well your heart's chambers and valves are working. In this test, a transducer is attached to the end of a flexible tube that's guided down your throat and into your esophagus (the tube leading from you mouth to your stomach) to get a more detailed image of your heart. You are not awake for the procedure. Please see the instruction sheet given to you today. For further information please visit [email or web address].   Your physician recommends that you schedule a follow-up appointment in: 6 months with Dr. Aundra Dubin

## 2017-10-24 DIAGNOSIS — Q210 Ventricular septal defect: Secondary | ICD-10-CM | POA: Diagnosis not present

## 2017-10-24 DIAGNOSIS — I428 Other cardiomyopathies: Secondary | ICD-10-CM | POA: Diagnosis not present

## 2017-10-24 DIAGNOSIS — N183 Chronic kidney disease, stage 3 (moderate): Secondary | ICD-10-CM | POA: Diagnosis not present

## 2017-10-24 DIAGNOSIS — E668 Other obesity: Secondary | ICD-10-CM | POA: Diagnosis not present

## 2017-10-24 DIAGNOSIS — E039 Hypothyroidism, unspecified: Secondary | ICD-10-CM | POA: Diagnosis not present

## 2017-10-24 DIAGNOSIS — R0600 Dyspnea, unspecified: Secondary | ICD-10-CM | POA: Diagnosis not present

## 2017-10-24 DIAGNOSIS — R0602 Shortness of breath: Secondary | ICD-10-CM | POA: Diagnosis not present

## 2017-10-24 DIAGNOSIS — Z7901 Long term (current) use of anticoagulants: Secondary | ICD-10-CM | POA: Diagnosis not present

## 2017-10-24 DIAGNOSIS — I5022 Chronic systolic (congestive) heart failure: Secondary | ICD-10-CM | POA: Diagnosis not present

## 2017-10-24 DIAGNOSIS — I472 Ventricular tachycardia: Secondary | ICD-10-CM | POA: Diagnosis not present

## 2017-10-24 DIAGNOSIS — I700 Atherosclerosis of aorta: Secondary | ICD-10-CM | POA: Diagnosis not present

## 2017-10-24 DIAGNOSIS — Z95810 Presence of automatic (implantable) cardiac defibrillator: Secondary | ICD-10-CM | POA: Diagnosis not present

## 2017-10-31 ENCOUNTER — Encounter (HOSPITAL_COMMUNITY): Payer: Self-pay | Admitting: *Deleted

## 2017-10-31 ENCOUNTER — Ambulatory Visit (HOSPITAL_BASED_OUTPATIENT_CLINIC_OR_DEPARTMENT_OTHER)
Admission: RE | Admit: 2017-10-31 | Discharge: 2017-10-31 | Disposition: A | Discharge status: Home / Self Care | Payer: Medicare Other | Source: Ambulatory Visit | Attending: Cardiology | Admitting: Cardiology

## 2017-10-31 ENCOUNTER — Encounter (HOSPITAL_COMMUNITY)
Admission: RE | Disposition: A | Discharge status: Home / Self Care | Payer: Self-pay | Source: Ambulatory Visit | Attending: Cardiology

## 2017-10-31 ENCOUNTER — Other Ambulatory Visit: Payer: Self-pay

## 2017-10-31 ENCOUNTER — Ambulatory Visit (HOSPITAL_COMMUNITY)
Admission: RE | Admit: 2017-10-31 | Discharge: 2017-10-31 | Disposition: A | Discharge status: Home / Self Care | Payer: Medicare Other | Source: Ambulatory Visit | Attending: Cardiology | Admitting: Cardiology

## 2017-10-31 DIAGNOSIS — Z79899 Other long term (current) drug therapy: Secondary | ICD-10-CM | POA: Insufficient documentation

## 2017-10-31 DIAGNOSIS — I420 Dilated cardiomyopathy: Secondary | ICD-10-CM | POA: Diagnosis not present

## 2017-10-31 DIAGNOSIS — I340 Nonrheumatic mitral (valve) insufficiency: Secondary | ICD-10-CM

## 2017-10-31 DIAGNOSIS — I5022 Chronic systolic (congestive) heart failure: Secondary | ICD-10-CM | POA: Insufficient documentation

## 2017-10-31 DIAGNOSIS — Z7901 Long term (current) use of anticoagulants: Secondary | ICD-10-CM | POA: Diagnosis not present

## 2017-10-31 DIAGNOSIS — N184 Chronic kidney disease, stage 4 (severe): Secondary | ICD-10-CM | POA: Insufficient documentation

## 2017-10-31 DIAGNOSIS — M109 Gout, unspecified: Secondary | ICD-10-CM | POA: Insufficient documentation

## 2017-10-31 DIAGNOSIS — Z79890 Hormone replacement therapy (postmenopausal): Secondary | ICD-10-CM | POA: Diagnosis not present

## 2017-10-31 DIAGNOSIS — E039 Hypothyroidism, unspecified: Secondary | ICD-10-CM | POA: Insufficient documentation

## 2017-10-31 DIAGNOSIS — Z8249 Family history of ischemic heart disease and other diseases of the circulatory system: Secondary | ICD-10-CM | POA: Insufficient documentation

## 2017-10-31 DIAGNOSIS — I480 Paroxysmal atrial fibrillation: Secondary | ICD-10-CM | POA: Diagnosis not present

## 2017-10-31 DIAGNOSIS — Z885 Allergy status to narcotic agent status: Secondary | ICD-10-CM | POA: Insufficient documentation

## 2017-10-31 DIAGNOSIS — Z8711 Personal history of peptic ulcer disease: Secondary | ICD-10-CM | POA: Diagnosis not present

## 2017-10-31 HISTORY — PX: TEE WITHOUT CARDIOVERSION: SHX5443

## 2017-10-31 SURGERY — ECHOCARDIOGRAM, TRANSESOPHAGEAL
Anesthesia: Moderate Sedation

## 2017-10-31 MED ORDER — BUTAMBEN-TETRACAINE-BENZOCAINE 2-2-14 % EX AERO
INHALATION_SPRAY | CUTANEOUS | Status: DC | PRN
  Administered 2017-10-31: 2 via TOPICAL

## 2017-10-31 MED ORDER — FENTANYL CITRATE (PF) 100 MCG/2ML IJ SOLN
INTRAMUSCULAR | Status: DC | PRN
  Administered 2017-10-31 (×2): 25 ug via INTRAVENOUS

## 2017-10-31 MED ORDER — MIDAZOLAM HCL 5 MG/ML IJ SOLN
INTRAMUSCULAR | Status: AC
  Filled 2017-10-31: qty 2

## 2017-10-31 MED ORDER — MIDAZOLAM HCL 10 MG/2ML IJ SOLN
INTRAMUSCULAR | Status: DC | PRN
  Administered 2017-10-31 (×2): 2 mg via INTRAVENOUS

## 2017-10-31 MED ORDER — SODIUM CHLORIDE 0.9 % IV SOLN
INTRAVENOUS | Status: DC
  Administered 2017-10-31: 09:00:00 via INTRAVENOUS

## 2017-10-31 MED ORDER — FENTANYL CITRATE (PF) 100 MCG/2ML IJ SOLN
INTRAMUSCULAR | Status: AC
  Filled 2017-10-31: qty 2

## 2017-10-31 NOTE — Interval H&P Note (Signed)
History and Physical Interval Note:  10/31/2017 10:07 AM  Casey [name]  has presented today for surgery, with the diagnosis of MITRAL REGURGITATION  The various methods of treatment have been discussed with the patient and family. After consideration of risks, benefits and other options for treatment, the patient has consented to  Procedure(s): TRANSESOPHAGEAL ECHOCARDIOGRAM (TEE) (N/A) as a surgical intervention .  The patient's history has been reviewed, patient examined, no change in status, stable for surgery.  I have reviewed the patient's chart and labs.  Questions were answered to the patient's satisfaction.     Rihaan Barrack [employer]

## 2017-10-31 NOTE — Discharge Instructions (Signed)

## 2017-10-31 NOTE — CV Procedure (Signed)
Procedure: TEE  Indication: MItral regurgitation.   Sedation: Versed 4 mg IV, Fentanyl 50 mcg IV  Findings: Please see echo section for full report.  Images were very difficult due to heart position. Mildly dilated left ventricle with normal wall thickness.  EF 15-20%, diffuse hypokinesis.  The right ventricle was mildly dilated with normal systolic function. Pacemaker wires in right heart. I think that we localized a VSD but were never able to measure gradient across it.  It is small, appears peri-membranous.  The left atrium was mildly dilated and the right atrium was mildly dilated.  No ASD/PFO by color doppler.  There was moderate TR, peak RV-RA gradient 38 mmHg.  Difficult images, but think mitral regurgitation is moderate and central (likely functional).  No pulmonary vein systolic flow reversal on doppler interrogation.  Trileaflet, mildly calcified aortic valve with no stenosis or regurgitation.  Normal caliber aorta with grade III plaque descending thoracic aorta.   Impression: Moderate functional MR.  Would not pursue Mitraclip.   Loralie Champagne 10/31/2017 10:48 AM

## 2017-11-01 ENCOUNTER — Encounter (HOSPITAL_COMMUNITY): Payer: Self-pay | Admitting: Cardiology

## 2017-11-07 DIAGNOSIS — Z95810 Presence of automatic (implantable) cardiac defibrillator: Secondary | ICD-10-CM | POA: Diagnosis not present

## 2017-11-07 DIAGNOSIS — Q210 Ventricular septal defect: Secondary | ICD-10-CM | POA: Diagnosis not present

## 2017-11-07 DIAGNOSIS — E039 Hypothyroidism, unspecified: Secondary | ICD-10-CM | POA: Diagnosis not present

## 2017-11-07 DIAGNOSIS — E668 Other obesity: Secondary | ICD-10-CM | POA: Diagnosis not present

## 2017-11-07 DIAGNOSIS — I5022 Chronic systolic (congestive) heart failure: Secondary | ICD-10-CM | POA: Diagnosis not present

## 2017-11-07 DIAGNOSIS — R0602 Shortness of breath: Secondary | ICD-10-CM | POA: Diagnosis not present

## 2017-11-07 DIAGNOSIS — I428 Other cardiomyopathies: Secondary | ICD-10-CM | POA: Diagnosis not present

## 2017-11-07 DIAGNOSIS — I472 Ventricular tachycardia: Secondary | ICD-10-CM | POA: Diagnosis not present

## 2017-11-07 DIAGNOSIS — I700 Atherosclerosis of aorta: Secondary | ICD-10-CM | POA: Diagnosis not present

## 2017-11-07 DIAGNOSIS — N183 Chronic kidney disease, stage 3 (moderate): Secondary | ICD-10-CM | POA: Diagnosis not present

## 2017-11-07 DIAGNOSIS — Z7901 Long term (current) use of anticoagulants: Secondary | ICD-10-CM | POA: Diagnosis not present

## 2017-11-07 DIAGNOSIS — R0600 Dyspnea, unspecified: Secondary | ICD-10-CM | POA: Diagnosis not present

## 2017-11-08 DIAGNOSIS — D631 Anemia in chronic kidney disease: Secondary | ICD-10-CM | POA: Diagnosis not present

## 2017-11-08 DIAGNOSIS — I4891 Unspecified atrial fibrillation: Secondary | ICD-10-CM | POA: Diagnosis not present

## 2017-11-08 DIAGNOSIS — N2581 Secondary hyperparathyroidism of renal origin: Secondary | ICD-10-CM | POA: Diagnosis not present

## 2017-11-08 DIAGNOSIS — Q600 Renal agenesis, unilateral: Secondary | ICD-10-CM | POA: Diagnosis not present

## 2017-11-08 DIAGNOSIS — Z95810 Presence of automatic (implantable) cardiac defibrillator: Secondary | ICD-10-CM | POA: Diagnosis not present

## 2017-11-08 DIAGNOSIS — N184 Chronic kidney disease, stage 4 (severe): Secondary | ICD-10-CM | POA: Diagnosis not present

## 2017-11-08 DIAGNOSIS — E039 Hypothyroidism, unspecified: Secondary | ICD-10-CM | POA: Diagnosis not present

## 2017-11-08 DIAGNOSIS — I428 Other cardiomyopathies: Secondary | ICD-10-CM | POA: Diagnosis not present

## 2017-11-08 DIAGNOSIS — R809 Proteinuria, unspecified: Secondary | ICD-10-CM | POA: Diagnosis not present

## 2017-11-08 DIAGNOSIS — M4800 Spinal stenosis, site unspecified: Secondary | ICD-10-CM | POA: Diagnosis not present

## 2017-11-09 ENCOUNTER — Ambulatory Visit: Payer: Medicare Other

## 2017-11-12 ENCOUNTER — Ambulatory Visit: Payer: Medicare Other

## 2017-11-12 ENCOUNTER — Ambulatory Visit
Admission: RE | Admit: 2017-11-12 | Discharge: 2017-11-12 | Disposition: A | Discharge status: Home / Self Care | Payer: Medicare Other | Source: Ambulatory Visit | Attending: Internal Medicine | Admitting: Internal Medicine

## 2017-11-12 DIAGNOSIS — Z1231 Encounter for screening mammogram for malignant neoplasm of breast: Secondary | ICD-10-CM | POA: Diagnosis not present

## 2017-11-19 DIAGNOSIS — E668 Other obesity: Secondary | ICD-10-CM | POA: Diagnosis not present

## 2017-11-19 DIAGNOSIS — Z7901 Long term (current) use of anticoagulants: Secondary | ICD-10-CM | POA: Diagnosis not present

## 2017-11-19 DIAGNOSIS — Z95810 Presence of automatic (implantable) cardiac defibrillator: Secondary | ICD-10-CM | POA: Diagnosis not present

## 2017-11-19 DIAGNOSIS — I5022 Chronic systolic (congestive) heart failure: Secondary | ICD-10-CM | POA: Diagnosis not present

## 2017-11-19 DIAGNOSIS — I700 Atherosclerosis of aorta: Secondary | ICD-10-CM | POA: Diagnosis not present

## 2017-11-19 DIAGNOSIS — N183 Chronic kidney disease, stage 3 (moderate): Secondary | ICD-10-CM | POA: Diagnosis not present

## 2017-11-19 DIAGNOSIS — R0602 Shortness of breath: Secondary | ICD-10-CM | POA: Diagnosis not present

## 2017-11-19 DIAGNOSIS — I428 Other cardiomyopathies: Secondary | ICD-10-CM | POA: Diagnosis not present

## 2017-11-19 DIAGNOSIS — Q210 Ventricular septal defect: Secondary | ICD-10-CM | POA: Diagnosis not present

## 2017-11-19 DIAGNOSIS — E039 Hypothyroidism, unspecified: Secondary | ICD-10-CM | POA: Diagnosis not present

## 2017-11-19 DIAGNOSIS — R0600 Dyspnea, unspecified: Secondary | ICD-10-CM | POA: Diagnosis not present

## 2017-11-19 DIAGNOSIS — I472 Ventricular tachycardia: Secondary | ICD-10-CM | POA: Diagnosis not present

## 2017-11-20 DIAGNOSIS — N184 Chronic kidney disease, stage 4 (severe): Secondary | ICD-10-CM | POA: Diagnosis not present

## 2017-12-05 DIAGNOSIS — E668 Other obesity: Secondary | ICD-10-CM | POA: Diagnosis not present

## 2017-12-05 DIAGNOSIS — Z7901 Long term (current) use of anticoagulants: Secondary | ICD-10-CM | POA: Diagnosis not present

## 2017-12-05 DIAGNOSIS — R0602 Shortness of breath: Secondary | ICD-10-CM | POA: Diagnosis not present

## 2017-12-05 DIAGNOSIS — I428 Other cardiomyopathies: Secondary | ICD-10-CM | POA: Diagnosis not present

## 2017-12-05 DIAGNOSIS — I700 Atherosclerosis of aorta: Secondary | ICD-10-CM | POA: Diagnosis not present

## 2017-12-05 DIAGNOSIS — N183 Chronic kidney disease, stage 3 (moderate): Secondary | ICD-10-CM | POA: Diagnosis not present

## 2017-12-05 DIAGNOSIS — I472 Ventricular tachycardia: Secondary | ICD-10-CM | POA: Diagnosis not present

## 2017-12-05 DIAGNOSIS — R0600 Dyspnea, unspecified: Secondary | ICD-10-CM | POA: Diagnosis not present

## 2017-12-05 DIAGNOSIS — Z95810 Presence of automatic (implantable) cardiac defibrillator: Secondary | ICD-10-CM | POA: Diagnosis not present

## 2017-12-05 DIAGNOSIS — I5022 Chronic systolic (congestive) heart failure: Secondary | ICD-10-CM | POA: Diagnosis not present

## 2017-12-05 DIAGNOSIS — E039 Hypothyroidism, unspecified: Secondary | ICD-10-CM | POA: Diagnosis not present

## 2017-12-05 DIAGNOSIS — Q210 Ventricular septal defect: Secondary | ICD-10-CM | POA: Diagnosis not present

## 2017-12-21 ENCOUNTER — Ambulatory Visit (HOSPITAL_COMMUNITY)
Admission: RE | Admit: 2017-12-21 | Discharge: 2017-12-21 | Disposition: A | Discharge status: Home / Self Care | Payer: Medicare Other | Source: Ambulatory Visit | Attending: Cardiology | Admitting: Cardiology

## 2017-12-21 ENCOUNTER — Encounter (HOSPITAL_COMMUNITY): Payer: Self-pay | Admitting: Cardiology

## 2017-12-21 ENCOUNTER — Other Ambulatory Visit: Payer: Self-pay

## 2017-12-21 VITALS — BP 137/61 | HR 75 | Wt 159.5 lb

## 2017-12-21 DIAGNOSIS — N184 Chronic kidney disease, stage 4 (severe): Secondary | ICD-10-CM | POA: Diagnosis not present

## 2017-12-21 DIAGNOSIS — I428 Other cardiomyopathies: Secondary | ICD-10-CM | POA: Diagnosis not present

## 2017-12-21 DIAGNOSIS — Z8711 Personal history of peptic ulcer disease: Secondary | ICD-10-CM | POA: Insufficient documentation

## 2017-12-21 DIAGNOSIS — Z885 Allergy status to narcotic agent status: Secondary | ICD-10-CM | POA: Insufficient documentation

## 2017-12-21 DIAGNOSIS — Z95810 Presence of automatic (implantable) cardiac defibrillator: Secondary | ICD-10-CM | POA: Insufficient documentation

## 2017-12-21 DIAGNOSIS — I480 Paroxysmal atrial fibrillation: Secondary | ICD-10-CM | POA: Diagnosis not present

## 2017-12-21 DIAGNOSIS — M109 Gout, unspecified: Secondary | ICD-10-CM | POA: Insufficient documentation

## 2017-12-21 DIAGNOSIS — Z79899 Other long term (current) drug therapy: Secondary | ICD-10-CM | POA: Diagnosis not present

## 2017-12-21 DIAGNOSIS — Z7901 Long term (current) use of anticoagulants: Secondary | ICD-10-CM | POA: Diagnosis not present

## 2017-12-21 DIAGNOSIS — I340 Nonrheumatic mitral (valve) insufficiency: Secondary | ICD-10-CM | POA: Insufficient documentation

## 2017-12-21 DIAGNOSIS — I5022 Chronic systolic (congestive) heart failure: Secondary | ICD-10-CM | POA: Insufficient documentation

## 2017-12-21 DIAGNOSIS — E039 Hypothyroidism, unspecified: Secondary | ICD-10-CM | POA: Diagnosis not present

## 2017-12-21 DIAGNOSIS — Z8249 Family history of ischemic heart disease and other diseases of the circulatory system: Secondary | ICD-10-CM | POA: Insufficient documentation

## 2017-12-21 DIAGNOSIS — Z79890 Hormone replacement therapy (postmenopausal): Secondary | ICD-10-CM | POA: Diagnosis not present

## 2017-12-21 LAB — COMPREHENSIVE METABOLIC PANEL
ALBUMIN: 4.4 g/dL (ref 3.5–5.0)
ALT: 16 U/L (ref 0–44)
AST: 24 U/L (ref 15–41)
Alkaline Phosphatase: 109 U/L (ref 38–126)
Anion gap: 12 (ref 5–15)
BUN: 66 mg/dL — AB (ref 8–23)
CHLORIDE: 112 mmol/L — AB (ref 98–111)
CO2: 21 mmol/L — AB (ref 22–32)
CREATININE: 3.18 mg/dL — AB (ref 0.44–1.00)
Calcium: 10.2 mg/dL (ref 8.9–10.3)
GFR calc Af Amer: 15 mL/min — ABNORMAL LOW (ref >60)
GFR calc non Af Amer: 13 mL/min — ABNORMAL LOW (ref >60)
GLUCOSE: 116 mg/dL — AB (ref 70–99)
POTASSIUM: 4.1 mmol/L (ref 3.5–5.1)
SODIUM: 145 mmol/L (ref 135–145)
Total Bilirubin: 1.6 mg/dL — ABNORMAL HIGH (ref 0.3–1.2)
Total Protein: 7.6 g/dL (ref 6.5–8.1)

## 2017-12-21 LAB — URIC ACID: Uric Acid, Serum: 11.5 mg/dL — ABNORMAL HIGH (ref 2.5–7.1)

## 2017-12-21 LAB — TSH: TSH: 0.706 u[IU]/mL (ref 0.350–4.500)

## 2017-12-21 MED ORDER — COLCHICINE 0.6 MG PO TABS: 0.6000 mg | ORAL_TABLET | Freq: Every day | ORAL | 0 refills | Status: DC | PRN

## 2017-12-21 MED ORDER — METOPROLOL SUCCINATE ER 50 MG PO TB24: 50.0000 mg | ORAL_TABLET | Freq: Every day | ORAL | 3 refills | Status: DC

## 2017-12-21 NOTE — Patient Instructions (Addendum)
INCREASE Metoprolol to 50mg  daily.   TAKE Colchicine 0.6 mg daily as needed for gout.  Routine lab work today. Will notify you of abnormal results  Follow up with [facility] clinic in 2 months.  Follow up with Dr.McLean in 4 months.

## 2017-12-23 NOTE — Progress Notes (Signed)
Advanced Heart Failure Clinic Note   Referring Physician: PCP: Elby Showers, MD PCP-Cardiologist: Ezzard Standing, MD  HF Cardiology: Dr. Aundra Dubin  HPI:  Casey [name] is a 83 y.o. female with long history of nonischemic cardiomyopathy, now CKD stage 3-4. She had cath in 5/05 with normal coronaries. She has MDT CRT-D device. Not on ACEI/ARB/ARNI due to renal dysfunction (tried all in the past). On warfarin with history of paroxysmal atrial fibrillation. Echo in 5/17 with EF 20%, moderate MR, moderate TR, small VSD.   Admitted 07/06/17 with increase SOB and decreased urine output. Echo with EF 10-15%, moderate-severe MR.  Diuresed with IV lasix and metolazone and lost 7 lbs with improvement in symptoms. Transitioned from last to torsemide. Meds optimized but limited due to CKD IV.   TEE in 5/19 showed moderate central MR, not severe.   She returns for followup of CHF.  She feels "pretty good."  Husband is in the hospital so is under some stress.  No dyspnea walking on flat ground.  No orthopnea/PND.  No chest pain.  No lightheadedness.  She has 1st MTP join pain, starting to improve.  She was told in the past that she has gout.    Labs (3/19): K 3.9, creatinine 2.6, hgb 15.3 Labs (4/19): LFTs normal Labs (5/19): K 4.9, creatinine 2.75, TSH normal, hgb 14.8  ECG (personally reviewed): NSR, BiV paced  Corevue: Impedance at baseline  Review of systems complete and found to be negative unless listed in HPI.    Past Medical History 1. Chronic systolic CHF: Long-standing nonischemic cardiomyopathy, 5/05 cath without significant CAD. Echo in 5/17 with EF 20%.  - Echo 1/19 with EF 10-15%, prominent dyssynchrony, moderate-severe MR, PASP 91 (but ?if this represents VSD flow rather than TR).  - MDT CRT-D device.  - TEE (5/19): EF 15-20%, mild LV dilation, small peri-membranous VSD, moderate central MR, RV mildly dilated with normal systolic function.  2. Atrial fibrillation:  Paroxysmal. She is on amiodarone and warfarin.  3. CKD: Stage 4. Follows with nephrology (Dr. Justin Mend).  4. H/o VSD:  Small/restrictive.  5. Mitral regurgitation: Moderate-severe central MR on 1/19 echo, likely functional.   - Moderate central MR on 5/19 TEE.  6. Gout 7. Hypothyroidism 8. PUD  Current Outpatient Medications  Medication Sig Dispense Refill  . allopurinol (ZYLOPRIM) 100 MG tablet TAKE 1 TABLET BY MOUTH EVERY DAY 30 tablet 11  . amiodarone (PACERONE) 200 MG tablet Take 200 mg by mouth daily.    . brinzolamide (AZOPT) 1 % ophthalmic suspension Place 1 drop into both eyes 3 (three) times daily.    . hydrALAZINE (APRESOLINE) 25 MG tablet Take 1.5 tablets (37.5 mg total) by mouth 3 (three) times daily. 115 tablet 3  . isosorbide mononitrate (IMDUR) 30 MG 24 hr tablet Take 1 tablet (30 mg total) by mouth daily. 30 tablet 6  . Latanoprost 0.005 % EMUL Apply 1 drop to eye at bedtime.    Marland Kitchen levothyroxine (SYNTHROID, LEVOTHROID) 75 MCG tablet Take 75 mcg by mouth daily before breakfast.     . metoprolol succinate (TOPROL-XL) 50 MG 24 hr tablet Take 1 tablet (50 mg total) by mouth daily. 30 tablet 3  . Multiple Vitamins-Calcium (ONE-A-DAY WOMENS FORMULA) TABS Take 1 tablet by mouth daily.      . potassium chloride SA (K-DUR,KLOR-CON) 20 MEQ tablet Take 20 mEq by mouth daily.     Marland Kitchen torsemide (DEMADEX) 20 MG tablet Take 3 tablets (60 mg total) by mouth  daily. 90 tablet 3  . vitamin D, CHOLECALCIFEROL, 400 UNITS tablet Take 800 Units by mouth daily.    Marland Kitchen warfarin (COUMADIN) 2 MG tablet Take 1 tablet (2 mg total) by mouth daily. 30 tablet 6  . colchicine 0.6 MG tablet Take 1 tablet (0.6 mg total) by mouth daily as needed. 30 tablet 0   No current facility-administered medications for this encounter.     Allergies  Allergen Reactions  . Codeine Rash      Social History   Socioeconomic History  . Marital status: Married    Spouse name: Not on file  . Number of children: Not on file    . Years of education: Not on file  . Highest education level: Not on file  Occupational History  . Not on file  Social Needs  . Financial resource strain: Not on file  . Food insecurity:    Worry: Not on file    Inability: Not on file  . Transportation needs:    Medical: Not on file    Non-medical: Not on file  Tobacco Use  . Smoking status: Never Smoker  . Smokeless tobacco: Never Used  Substance and Sexual Activity  . Alcohol use: No  . Drug use: No  . Sexual activity: Not on file  Lifestyle  . Physical activity:    Days per week: Not on file    Minutes per session: Not on file  . Stress: Not on file  Relationships  . Social connections:    Talks on phone: Not on file    Gets together: Not on file    Attends religious service: Not on file    Active member of club or organization: Not on file    Attends meetings of clubs or organizations: Not on file    Relationship status: Not on file  . Intimate partner violence:    Fear of current or ex partner: Not on file    Emotionally abused: Not on file    Physically abused: Not on file    Forced sexual activity: Not on file  Other Topics Concern  . Not on file  Social History Narrative  . Not on file      Family History  Problem Relation Age of Onset  . Heart failure Mother   . Cancer Father    Vitals:   12/21/17 1035  BP: 137/61  Pulse: 75  SpO2: 98%  Weight: 159 lb 8 oz (72.3 kg)     Wt Readings from Last 3 Encounters:  12/21/17 159 lb 8 oz (72.3 kg)  10/31/17 161 lb 4 oz (73.1 kg)  10/23/17 161 lb 4 oz (73.1 kg)    PHYSICAL EXAM: General: NAD Neck: No JVD, no thyromegaly or thyroid nodule.  Lungs: Clear to auscultation bilaterally with normal respiratory effort. CV: Nondisplaced PMI.  Heart regular S1/S2, no S3/S4, no murmur.  No peripheral edema.  No carotid bruit.  Normal pedal pulses.  Abdomen: Soft, nontender, no hepatosplenomegaly, no distention.  Skin: Intact without lesions or rashes.   Neurologic: Alert and oriented x 3.  Psych: Normal affect. Extremities: No clubbing or cyanosis. 1st MTP warm on left.  HEENT: Normal.   ASSESSMENT & PLAN:  1. Chronic systolic CHF: Long-standing NICM, 5/05 cath without significant CAD. Echo in 5/17 with EF 20%, echo in 1/19 with EF 10-15%, prominent dyssynchrony, moderate-severe MR, PASP 91 (but ?if this represents VSD flow rather than TR). Complicated by CKD stage 4. Has MDT CRT-D device. In  1/19, she underwent V-V optimization per EP.  TEE in 5/19 showed moderate MR, EF 15-20%.  Today, she does not appear significantly volume overloaded by exam or Corevue and weight is stable.  NYHA class II symptoms, some improvement.  - Continue torsemide 60 mg daily.  BMET today.  - Increase Toprol XL to 50 mg daily.   - No ACEI/ARB/ARNI: has failed in the past with AKI.  - Continue hydralazine 37.5 mg tid + Imdur 30 daily.  - Most recent creatinine contraindication for spironolactone.  2. Atrial fibrillation: Paroxysmal. Maintaining NSR.  - Continue amiodarone. Check LFTs/TSH.  She will need regular eye exams.  - Continue warfarin.  3. CKD: Stage 4.     4. H/o VSD: Small peri-membranous VSD on 5/19 TEE.   5. Mitral regurgitation: TEE 5/19 with moderate functional MR.  Not candidate for Mitraclip.    Followup in 2 months with NP/PA and me in 4 months.   Loralie Champagne, MD 12/23/17

## 2017-12-26 ENCOUNTER — Telehealth (HOSPITAL_COMMUNITY): Payer: Self-pay

## 2017-12-26 DIAGNOSIS — I5022 Chronic systolic (congestive) heart failure: Secondary | ICD-10-CM

## 2017-12-26 MED ORDER — TORSEMIDE 20 MG PO TABS: ORAL_TABLET | ORAL | 3 refills | Status: DC

## 2017-12-26 NOTE — Telephone Encounter (Signed)
Notes recorded by Shirley Muscat, RN on 12/26/2017 at 12:01 PM EDT Pt aware of results, agreeable to med changes (changes made in [facility]), Lab appointment made (orders placed)   ------  Notes recorded by Larey Dresser, MD on 12/25/2017 at 4:08 PM EDT Decrease to 40 daily alternating with 20 daily. BMET 1 week ------  Notes recorded by Shirley Muscat, RN on 12/25/2017 at 9:42 AM EDT Pt states she has been taking 40 mg for 2 months now ------  Notes recorded by Larey Dresser, MD on 12/23/2017 at 11:55 AM EDT High uric acid is suggestive of gout in toe. Creatinine higher, decrease torsemide to 40 mg daily for now. BMET 1 week. ------  Notes recorded by Scarlette Calico, RN on 12/21/2017 at 3:11 PM EDT Labs reviewed by RN, will forward to MD for review

## 2018-01-01 ENCOUNTER — Emergency Department (HOSPITAL_COMMUNITY): Payer: Medicare Other

## 2018-01-01 ENCOUNTER — Other Ambulatory Visit: Payer: Self-pay

## 2018-01-01 ENCOUNTER — Encounter (HOSPITAL_COMMUNITY): Payer: Self-pay | Admitting: Emergency Medicine

## 2018-01-01 ENCOUNTER — Inpatient Hospital Stay (HOSPITAL_COMMUNITY)
Admission: EM | Admit: 2018-01-01 | Discharge: 2018-01-09 | DRG: 493 | Disposition: A | Discharge status: Skilled Nursing Facility | Payer: Medicare Other | Attending: Internal Medicine | Admitting: Internal Medicine

## 2018-01-01 DIAGNOSIS — Y92009 Unspecified place in unspecified non-institutional (private) residence as the place of occurrence of the external cause: Secondary | ICD-10-CM

## 2018-01-01 DIAGNOSIS — W19XXXA Unspecified fall, initial encounter: Secondary | ICD-10-CM | POA: Diagnosis not present

## 2018-01-01 DIAGNOSIS — I428 Other cardiomyopathies: Secondary | ICD-10-CM | POA: Diagnosis not present

## 2018-01-01 DIAGNOSIS — I5022 Chronic systolic (congestive) heart failure: Secondary | ICD-10-CM | POA: Diagnosis present

## 2018-01-01 DIAGNOSIS — E039 Hypothyroidism, unspecified: Secondary | ICD-10-CM | POA: Diagnosis present

## 2018-01-01 DIAGNOSIS — R42 Dizziness and giddiness: Secondary | ICD-10-CM | POA: Diagnosis not present

## 2018-01-01 DIAGNOSIS — I959 Hypotension, unspecified: Secondary | ICD-10-CM | POA: Diagnosis present

## 2018-01-01 DIAGNOSIS — W010XXA Fall on same level from slipping, tripping and stumbling without subsequent striking against object, initial encounter: Secondary | ICD-10-CM | POA: Diagnosis present

## 2018-01-01 DIAGNOSIS — Z7901 Long term (current) use of anticoagulants: Secondary | ICD-10-CM

## 2018-01-01 DIAGNOSIS — N179 Acute kidney failure, unspecified: Secondary | ICD-10-CM | POA: Diagnosis not present

## 2018-01-01 DIAGNOSIS — S82899A Other fracture of unspecified lower leg, initial encounter for closed fracture: Secondary | ICD-10-CM

## 2018-01-01 DIAGNOSIS — I130 Hypertensive heart and chronic kidney disease with heart failure and stage 1 through stage 4 chronic kidney disease, or unspecified chronic kidney disease: Secondary | ICD-10-CM | POA: Diagnosis present

## 2018-01-01 DIAGNOSIS — N183 Chronic kidney disease, stage 3 unspecified: Secondary | ICD-10-CM | POA: Diagnosis present

## 2018-01-01 DIAGNOSIS — S82892A Other fracture of left lower leg, initial encounter for closed fracture: Secondary | ICD-10-CM | POA: Diagnosis present

## 2018-01-01 DIAGNOSIS — S0990XA Unspecified injury of head, initial encounter: Secondary | ICD-10-CM | POA: Diagnosis not present

## 2018-01-01 DIAGNOSIS — S8292XA Unspecified fracture of left lower leg, initial encounter for closed fracture: Secondary | ICD-10-CM | POA: Diagnosis not present

## 2018-01-01 DIAGNOSIS — S82842A Displaced bimalleolar fracture of left lower leg, initial encounter for closed fracture: Principal | ICD-10-CM

## 2018-01-01 DIAGNOSIS — M109 Gout, unspecified: Secondary | ICD-10-CM | POA: Diagnosis present

## 2018-01-01 DIAGNOSIS — I700 Atherosclerosis of aorta: Secondary | ICD-10-CM | POA: Diagnosis present

## 2018-01-01 DIAGNOSIS — Q210 Ventricular septal defect: Secondary | ICD-10-CM

## 2018-01-01 DIAGNOSIS — I340 Nonrheumatic mitral (valve) insufficiency: Secondary | ICD-10-CM | POA: Diagnosis present

## 2018-01-01 DIAGNOSIS — Z95810 Presence of automatic (implantable) cardiac defibrillator: Secondary | ICD-10-CM | POA: Diagnosis present

## 2018-01-01 DIAGNOSIS — I480 Paroxysmal atrial fibrillation: Secondary | ICD-10-CM | POA: Diagnosis present

## 2018-01-01 DIAGNOSIS — R791 Abnormal coagulation profile: Secondary | ICD-10-CM | POA: Diagnosis present

## 2018-01-01 DIAGNOSIS — Z79899 Other long term (current) drug therapy: Secondary | ICD-10-CM

## 2018-01-01 DIAGNOSIS — K219 Gastro-esophageal reflux disease without esophagitis: Secondary | ICD-10-CM | POA: Diagnosis present

## 2018-01-01 DIAGNOSIS — M791 Myalgia, unspecified site: Secondary | ICD-10-CM | POA: Diagnosis not present

## 2018-01-01 LAB — BASIC METABOLIC PANEL
Anion gap: 10 (ref 5–15)
BUN: 54 mg/dL — ABNORMAL HIGH (ref 8–23)
CHLORIDE: 113 mmol/L — AB (ref 98–111)
CO2: 21 mmol/L — AB (ref 22–32)
CREATININE: 2.93 mg/dL — AB (ref 0.44–1.00)
Calcium: 9.5 mg/dL (ref 8.9–10.3)
GFR calc non Af Amer: 14 mL/min — ABNORMAL LOW (ref >60)
GFR, EST AFRICAN AMERICAN: 16 mL/min — AB (ref >60)
Glucose, Bld: 126 mg/dL — ABNORMAL HIGH (ref 70–99)
Potassium: 4.2 mmol/L (ref 3.5–5.1)
Sodium: 144 mmol/L (ref 135–145)

## 2018-01-01 LAB — CBC WITH DIFFERENTIAL/PLATELET
Abs Immature Granulocytes: 0 10*3/uL (ref 0.0–0.1)
Basophils Absolute: 0.1 10*3/uL (ref 0.0–0.1)
Basophils Relative: 1 %
EOS ABS: 0.2 10*3/uL (ref 0.0–0.7)
EOS PCT: 3 %
HEMATOCRIT: 44.6 % (ref 36.0–46.0)
HEMOGLOBIN: 14.1 g/dL (ref 12.0–15.0)
Immature Granulocytes: 0 %
LYMPHS PCT: 18 %
Lymphs Abs: 1 10*3/uL (ref 0.7–4.0)
MCH: 33.8 pg (ref 26.0–34.0)
MCHC: 31.6 g/dL (ref 30.0–36.0)
MCV: 107 fL — AB (ref 78.0–100.0)
MONO ABS: 0.8 10*3/uL (ref 0.1–1.0)
MONOS PCT: 14 %
Neutro Abs: 3.7 10*3/uL (ref 1.7–7.7)
Neutrophils Relative %: 64 %
Platelets: 118 10*3/uL — ABNORMAL LOW (ref 150–400)
RBC: 4.17 MIL/uL (ref 3.87–5.11)
RDW: 11.9 % (ref 11.5–15.5)
WBC: 5.7 10*3/uL (ref 4.0–10.5)

## 2018-01-01 LAB — PROTIME-INR
INR: 2.22
PROTHROMBIN TIME: 24.4 s — AB (ref 11.4–15.2)

## 2018-01-01 MED ORDER — ETOMIDATE 2 MG/ML IV SOLN
INTRAVENOUS | Status: AC | PRN
  Administered 2018-01-01: 6 mg via INTRAVENOUS

## 2018-01-01 MED ORDER — ETOMIDATE 2 MG/ML IV SOLN
12.0000 mg | Freq: Once | INTRAVENOUS | Status: DC
  Filled 2018-01-01: qty 10

## 2018-01-01 MED ORDER — FENTANYL CITRATE (PF) 100 MCG/2ML IJ SOLN
INTRAMUSCULAR | Status: AC | PRN
  Administered 2018-01-01 (×2): 25 ug via INTRAVENOUS

## 2018-01-01 MED ORDER — FENTANYL CITRATE (PF) 100 MCG/2ML IJ SOLN
50.0000 ug | INTRAMUSCULAR | Status: DC | PRN
  Administered 2018-01-01: 50 ug via INTRAVENOUS
  Filled 2018-01-01 (×2): qty 2

## 2018-01-01 NOTE — ED Notes (Signed)
Patient transported to X-ray 

## 2018-01-01 NOTE — Sedation Documentation (Signed)
MD Nanavati preformed LEFT ankle redirection without complication.

## 2018-01-01 NOTE — ED Triage Notes (Signed)
Per [facility], pt coming from home with apparent left lower leg injury after a fall. Pulses 3+ in foot.  Pt was getting off the toilet and got dizzy and had a mechanical fall. Pt denies any LOC, head or neck pain. Foot is currently splinted and pt complains of 10/10 pain.

## 2018-01-01 NOTE — ED Provider Notes (Signed)
[facility] EMERGENCY DEPARTMENT Provider Note   CSN: [id number] Arrival date & time: 01/01/18  1933     History   Chief Complaint Chief Complaint  Patient presents with  . Leg Injury    HPI Casey [name] is a 83 y.o. female.  HPI  83 year old female comes in with chief complaint of fall.  Patient has history of CKD, CHF status post biventricular ICD placement.  Patient reports that she was walking in her house, when she suddenly lost her balance and fell.  Patient immediately had left ankle pain along with swelling.  Patient is unsure if she had traumatic head injury, however she is on Coumadin because of her history of strokes.  Besides the left ankle, patient denies pain in her hips, back, abdomen, chest, upper extremities.  Patient also denies any new numbness, tingling.  Past Medical History:  Diagnosis Date  . Atherosclerosis of aorta ([place])   . Biventricular ICD (implantable cardiac defibrillator) in place    medtronic   . Chronic kidney disease (CKD), stage III (moderate) (HCC)   . Chronic systolic heart failure ([place])   . Gout   . History of peptic ulcer disease   . Hypertension   . Hypothyroidism   . Lumbar disc disease   . Nonischemic cardiomyopathy ([place])   . Ventricular septal defect   . VT (ventricular tachycardia) ([place])    appropriate therapy via ICD 2013    Patient Active Problem List   Diagnosis Date Noted  . Ankle fracture 01/02/2018  . Closed left ankle fracture 01/02/2018  . History of peptic ulcer disease   . Atherosclerosis of aorta ([place])   . Bilateral hearing loss 12/09/2015  . Kidney disease, chronic, stage III (GFR 30-59 ml/min) (HCC)   . Ventricular septal defect (VSD)   . Long-term (current) use of anticoagulants   . Hypothyroidism 03/11/2013  . Gout   . Vitamin D deficiency 03/06/2011  . Glaucoma   . Anxiety   . Spinal stenosis   . Biventricular implantable cardioverter-defibrillator in situ   . Nonischemic  cardiomyopathy ([place])   . Paroxysmal atrial fibrillation (HCC)   . Hyperlipidemia   . Ventricular tachycardia   . Hypertensive heart disease   . Chronic systolic CHF (congestive heart failure) (HCC)     Past Surgical History:  Procedure Laterality Date  . ABDOMINAL HYSTERECTOMY    . BIV ICD GENERTAOR CHANGE OUT N/A 04/16/2013   Procedure: BIV ICD GENERTAOR CHANGE OUT;  Surgeon: Deboraha Sprang, MD;  Location: [facility] CATH LAB;  Service: Cardiovascular;  Laterality: N/A;  . CESAREAN SECTION    . FEMUR IM NAIL    . TEE WITHOUT CARDIOVERSION N/A 10/31/2017   Procedure: TRANSESOPHAGEAL ECHOCARDIOGRAM (TEE);  Surgeon: Larey Dresser, MD;  Location: [facility] ENDOSCOPY;  Service: Cardiovascular;  Laterality: N/A;     OB History   None      Home Medications    Prior to Admission medications   Medication Sig Start Date End Date Taking? Authorizing Provider  amiodarone (PACERONE) 200 MG tablet Take 200 mg by mouth daily.   Yes [provider]  colchicine 0.6 MG tablet Take 1 tablet (0.6 mg total) by mouth daily as needed. 12/21/17  Yes Larey Dresser, MD  dorzolamide (TRUSOPT) 2 % ophthalmic solution Place 1 drop into both eyes 3 (three) times daily.   Yes [provider]  hydrALAZINE (APRESOLINE) 25 MG tablet Take 1.5 tablets (37.5 mg total) by mouth 3 (three) times daily.  10/23/17  Yes Larey Dresser, MD  isosorbide mononitrate (IMDUR) 30 MG 24 hr tablet Take 1 tablet (30 mg total) by mouth daily. 07/12/17  Yes Georgiana Shore, NP  Latanoprost 0.005 % EMUL Place 1 drop into both eyes at bedtime.    Yes [provider]  levothyroxine (SYNTHROID, LEVOTHROID) 75 MCG tablet Take 75 mcg by mouth daily before breakfast.    Yes [provider]  metoprolol succinate (TOPROL-XL) 50 MG 24 hr tablet Take 1 tablet (50 mg total) by mouth daily. 12/21/17  Yes Larey Dresser, MD  Multiple Vitamins-Calcium (ONE-A-DAY WOMENS FORMULA) TABS Take 1 tablet by mouth daily.     Yes  [provider]  potassium chloride SA (K-DUR,KLOR-CON) 20 MEQ tablet Take 20 mEq by mouth daily.    Yes [provider]  torsemide (DEMADEX) 20 MG tablet Take 40 mg (2 tabs) daily, alternating with 20 mg (1 tab) every other day Patient taking differently: Take 60 mg by mouth daily. . 12/26/17  Yes Larey Dresser, MD  warfarin (COUMADIN) 2 MG tablet Take 1 tablet (2 mg total) by mouth daily. 07/12/17  Yes Georgiana Shore, NP  allopurinol (ZYLOPRIM) 100 MG tablet TAKE 1 TABLET BY MOUTH EVERY DAY Patient not taking: Reported on 01/01/2018 05/02/16   Elby Showers, MD    Family History Family History  Problem Relation Age of Onset  . Heart failure Mother   . Cancer Father     Social History Social History   Tobacco Use  . Smoking status: Never Smoker  . Smokeless tobacco: Never Used  Substance Use Topics  . Alcohol use: No  . Drug use: No     Allergies   Codeine   Review of Systems Review of Systems  Constitutional: Positive for activity change.  Respiratory: Negative for shortness of breath.   Cardiovascular: Negative for chest pain.  Gastrointestinal: Negative for nausea.  Musculoskeletal: Positive for arthralgias and myalgias.  Skin: Positive for rash.  Neurological: Negative for headaches.  Hematological: Bruises/bleeds easily.  All other systems reviewed and are negative.    Physical Exam Updated Vital Signs BP 130/64   Pulse 66   Temp (!) 97.3 F (36.3 C)   Resp 11   Ht 5\' 2"  (1.575 m)   Wt 72.6 kg (160 lb)   SpO2 99%   BMI 29.26 kg/m   Physical Exam  Constitutional: She is oriented to person, place, and time. She appears well-developed.  HENT:  Head: Normocephalic and atraumatic.  Eyes: EOM are normal.  Neck: Normal range of motion. Neck supple.  No midline c-spine tenderness, pt able to turn head to 45 degrees bilaterally without any pain and able to flex neck to the chest and extend without any pain or neurologic symptoms.     Cardiovascular: Normal rate.  Pulmonary/Chest: Effort normal.  Abdominal: Bowel sounds are normal.  Musculoskeletal:  Dislocated appearing left ankle-foot Patient has a faint dorsalis pedis.  There is gross edema, without any bleeding.  Neurological: She is alert and oriented to person, place, and time.  Skin: Skin is warm and dry.  Nursing note and vitals reviewed.    ED Treatments / Results  Labs (all labs ordered are listed, but only abnormal results are displayed) Labs Reviewed  CBC WITH DIFFERENTIAL/PLATELET - Abnormal; Notable for the following components:      Result Value   MCV 107.0 (*)    Platelets 118 (*)    All other components within normal  limits  BASIC METABOLIC PANEL - Abnormal; Notable for the following components:   Chloride 113 (*)    CO2 21 (*)    Glucose, Bld 126 (*)    BUN 54 (*)    Creatinine, Ser 2.93 (*)    GFR calc non Af Amer 14 (*)    GFR calc Af Amer 16 (*)    All other components within normal limits  PROTIME-INR - Abnormal; Notable for the following components:   Prothrombin Time 24.4 (*)    All other components within normal limits  BASIC METABOLIC PANEL  CBC  TYPE AND SCREEN    EKG None  Radiology Dg Ankle Complete Left  Result Date: 01/01/2018 CLINICAL DATA:  Post reduction EXAM: LEFT ANKLE COMPLETE - 3+ VIEW COMPARISON:  01/01/2018 FINDINGS: Placement of cast material obscures bone detail. Partially visualized surgical rod within the distal tibia. Old fracture deformity of the distal shaft of the tibia. Acute medial malleolar fracture with mild displacement and slight asymmetric widening of the superior medial mortise. Acute distal fibular fracture with residual 1/4 bone with of lateral displacement of distal fracture fragment. Suspected posterior malleolar fracture with mild residual displacement. Residual widened appearance of the anterior joint space with anterior avulsion fracture off the distal tibia. IMPRESSION: 1. Interim casting  of the left ankle 2. Acute mildly displaced medial and lateral malleolar fractures. Suspected posterior malleolar fracture with mild displacement 3. Residual widening of the anterior ankle joint with mild posterior subluxation of talar dome. Probable avulsion fracture off the distal anterior tibia. Electronically Signed   By: Donavan Foil M.D.   On: 01/01/2018 23:58   Dg Ankle Complete Left  Result Date: 01/01/2018 CLINICAL DATA:  83 year old with left lower leg injury after fall. Dislocation. EXAM: LEFT ANKLE COMPLETE - 3+ VIEW COMPARISON:  None. FINDINGS: Intramedullary rod in the distal tibia related to old fracture. Displaced fractures involving the medial malleolus and lateral malleolus. Disruption of the ankle mortise. Soft tissue swelling associated with the fractures. There is at least posterior subluxation and possibly dislocation of the ankle joint. Prominent plantar calcaneal spur. IMPRESSION: Left ankle fractures with disruption of the ankle mortise. Posterior subluxation or dislocation at the ankle joint. Electronically Signed   By: Markus Daft M.D.   On: 01/01/2018 21:31   Ct Head Wo Contrast  Result Date: 01/01/2018 CLINICAL DATA:  Fall after dizziness.  No loss of consciousness. EXAM: CT HEAD WITHOUT CONTRAST TECHNIQUE: Contiguous axial images were obtained from the base of the skull through the vertex without intravenous contrast. COMPARISON:  None. FINDINGS: Brain: Mild diffuse cortical atrophy is noted. Mild chronic ischemic white matter disease is noted. No mass effect or midline shift is noted. Ventricular size is within normal limits. There is no evidence of mass lesion, hemorrhage or acute infarction. Vascular: No hyperdense vessel or unexpected calcification. Skull: Normal. Negative for fracture or focal lesion. Sinuses/Orbits: No acute finding. Other: None. IMPRESSION: Mild diffuse cortical atrophy. Mild chronic ischemic white matter disease. No acute intracranial abnormality seen.  Electronically Signed   By: Marijo Conception, M.D.   On: 01/01/2018 22:36    Procedures .Sedation Date/Time: 01/02/2018 1:24 AM Performed by: Varney Biles, MD Authorized by: Varney Biles, MD   Consent:    Consent obtained:  Verbal   Consent given by:  Patient   Risks discussed:  Allergic reaction, dysrhythmia, inadequate sedation, nausea, prolonged hypoxia resulting in organ damage, prolonged sedation necessitating reversal, respiratory compromise necessitating ventilatory assistance and intubation and vomiting  Alternatives discussed:  Analgesia without sedation, anxiolysis and regional anesthesia Universal protocol:    Procedure explained and questions answered to patient or proxy's satisfaction: yes     Relevant documents present and verified: yes     Test results available and properly labeled: yes     Imaging studies available: yes     Required blood products, implants, devices, and special equipment available: yes     Site/side marked: yes     Immediately prior to procedure a time out was called: yes     Patient identity confirmation method:  Verbally with patient Indications:    Procedure performed:  Fracture reduction   Procedure necessitating sedation performed by:  Physician performing sedation   Intended level of sedation:  Deep Pre-sedation assessment:    Time since last food or drink:  8 hours   ASA classification: class 4 - patient with severe systemic disease that is a constant threat to life     Neck mobility: normal     Mouth opening:  3 or more finger widths   Thyromental distance:  4 finger widths   Mallampati score:  I - soft palate, uvula, fauces, pillars visible   Pre-sedation assessments completed and reviewed: airway patency, cardiovascular function, hydration status, mental status, nausea/vomiting, pain level, respiratory function and temperature     Pre-sedation assessment completed:  01/01/2018 11:30 PM Immediate pre-procedure details:     Reassessment: Patient reassessed immediately prior to procedure     Reviewed: vital signs, relevant labs/tests and NPO status     Verified: bag valve mask available, emergency equipment available, intubation equipment available, IV patency confirmed, oxygen available and suction available   Procedure details (see MAR for exact dosages):    Preoxygenation:  Nasal cannula   Sedation:  Propofol   Intra-procedure monitoring:  Blood pressure monitoring, cardiac monitor, continuous pulse oximetry, frequent LOC assessments, frequent vital sign checks and continuous capnometry   Intra-procedure events: none     Total Provider sedation time (minutes):  25 Post-procedure details:    Post-sedation assessment completed:  01/02/2018 1:25 AM   Attendance: Constant attendance by certified staff until patient recovered     Recovery: Patient returned to pre-procedure baseline     Post-sedation assessments completed and reviewed: airway patency, cardiovascular function, hydration status, mental status, nausea/vomiting, pain level, respiratory function and temperature     Patient is stable for discharge or admission: yes     Patient tolerance:  Tolerated well, no immediate complications Reduction of fracture Date/Time: 01/02/2018 1:25 AM Performed by: Varney Biles, MD Authorized by: Varney Biles, MD  Consent: Written consent obtained. Risks and benefits: risks, benefits and alternatives were discussed Consent given by: patient Patient understanding: patient states understanding of the procedure being performed Patient consent: the patient's understanding of the procedure matches consent given Procedure consent: procedure consent matches procedure scheduled Relevant documents: relevant documents present and verified Test results: test results available and properly labeled Site marked: the operative site was marked Imaging studies: imaging studies available Required items: required blood products,  implants, devices, and special equipment available Patient identity confirmed: arm band Time out: Immediately prior to procedure a "time out" was called to verify the correct patient, procedure, equipment, support staff and site/side marked as required. Preparation: Patient was prepped and draped in the usual sterile fashion. Local anesthesia used: no  Anesthesia: Local anesthesia used: no Patient tolerance: Patient tolerated the procedure well with no immediate complications      SPLINT APPLICATION Date/Time: 3:26 AM  Authorized by: Sumaiya Arruda Consent: Verbal consent obtained. Risks and benefits: risks, benefits and alternatives were discussed Consent given by: patient Splint applied by: orthopedic technician Location details: Left ankle Splint type: Ankle splint with stirrups Supplies used: gauze, webril, ace wrap, plaster of [place] Post-procedure: The splinted body part was neurovascularly unchanged following the procedure. Patient tolerance: Patient tolerated the procedure well with no immediate complications.     (including critical care time)  Medications Ordered in ED Medications  etomidate (AMIDATE) injection 12 mg (6 mg Intravenous See Procedure Record 01/01/18 2357)  amiodarone (PACERONE) tablet 200 mg (has no administration in time range)  dorzolamide (TRUSOPT) 2 % ophthalmic solution 1 drop (has no administration in time range)  hydrALAZINE (APRESOLINE) tablet 37.5 mg (has no administration in time range)  isosorbide mononitrate (IMDUR) 24 hr tablet 30 mg (has no administration in time range)  Latanoprost 0.005 % EMUL 1 drop (has no administration in time range)  levothyroxine (SYNTHROID, LEVOTHROID) tablet 75 mcg (has no administration in time range)  metoprolol succinate (TOPROL-XL) 24 hr tablet 50 mg (has no administration in time range)  multivitamin with minerals tablet 1 tablet (has no administration in time range)  potassium chloride SA (K-DUR,KLOR-CON)  CR tablet 20 mEq (has no administration in time range)  torsemide (DEMADEX) tablet 60 mg (has no administration in time range)  acetaminophen (TYLENOL) tablet 650 mg (has no administration in time range)    Or  acetaminophen (TYLENOL) suppository 650 mg (has no administration in time range)  ondansetron (ZOFRAN) tablet 4 mg (has no administration in time range)    Or  ondansetron (ZOFRAN) injection 4 mg (has no administration in time range)  fentaNYL (SUBLIMAZE) injection 25 mcg (has no administration in time range)  etomidate (AMIDATE) injection (6 mg Intravenous Given 01/01/18 2330)  fentaNYL (SUBLIMAZE) injection (25 mcg Intravenous Given 01/01/18 2335)     Initial Impression / Assessment and Plan / ED Course  I have reviewed the triage vital signs and the nursing notes.  Pertinent labs & imaging results that were available during my care of the patient were reviewed by me and considered in my medical decision making (see chart for details).  Clinical Course as of Jan 03 128  Tue Jan 01, 2018  2150 Results from the ER workup discussed with the patient face to face and all questions answered to the best of my ability.  Patient has consented for closed reduction of the ankle fracture dislocation. Dr. Marlou Sa, orthopedics have cleared patient for the reduction.  DG Ankle Complete Left [AN]  Wed Jan 02, 2018  0127 Patient lives by herself, and according to Dr. Marlou Sa post orthopedic surgery, she will need to be placed in a rehab.  I do not think it will be safe for patient to go home without weightbearing for 2 weeks.  Medicine will admit the patient. Patient is on Coumadin, which will needed to be held.  Dr. Marlou Sa will follow in the hospital, most likely surgery will be done on Thursday.   [AN]    Clinical Course User Index [AN] Varney Biles, MD    DDx includes: - Mechanical falls - ICH - Fractures - Contusions - Soft tissue injury  83 year old female with history of cardiac  pathology who is on Coumadin comes in with chief complaint of fall.  As a result of her fall it appears that patient has left ankle fracture-dislocation.  Appropriate imaging has been ordered.  Given that patient is on Coumadin, CT head is  been ordered.  C-spine has been cleared clinically.  Final Clinical Impressions(s) / ED Diagnoses   Final diagnoses:  Closed displaced bimalleolar fracture of left ankle, initial encounter    ED Discharge Orders    None       Varney Biles, MD 01/02/18 [phone]

## 2018-01-01 NOTE — Sedation Documentation (Signed)
Ortho Tech applied LEFT ankle splint.

## 2018-01-02 ENCOUNTER — Other Ambulatory Visit: Payer: Self-pay

## 2018-01-02 ENCOUNTER — Inpatient Hospital Stay (HOSPITAL_COMMUNITY): Payer: Medicare Other

## 2018-01-02 ENCOUNTER — Encounter (HOSPITAL_COMMUNITY): Payer: Self-pay | Admitting: Internal Medicine

## 2018-01-02 ENCOUNTER — Other Ambulatory Visit (HOSPITAL_COMMUNITY): Payer: Medicare Other

## 2018-01-02 DIAGNOSIS — M6281 Muscle weakness (generalized): Secondary | ICD-10-CM | POA: Diagnosis not present

## 2018-01-02 DIAGNOSIS — Z9181 History of falling: Secondary | ICD-10-CM | POA: Diagnosis not present

## 2018-01-02 DIAGNOSIS — S8292XA Unspecified fracture of left lower leg, initial encounter for closed fracture: Secondary | ICD-10-CM | POA: Diagnosis not present

## 2018-01-02 DIAGNOSIS — I130 Hypertensive heart and chronic kidney disease with heart failure and stage 1 through stage 4 chronic kidney disease, or unspecified chronic kidney disease: Secondary | ICD-10-CM | POA: Diagnosis not present

## 2018-01-02 DIAGNOSIS — S82892A Other fracture of left lower leg, initial encounter for closed fracture: Secondary | ICD-10-CM | POA: Diagnosis present

## 2018-01-02 DIAGNOSIS — S82842A Displaced bimalleolar fracture of left lower leg, initial encounter for closed fracture: Secondary | ICD-10-CM | POA: Diagnosis not present

## 2018-01-02 DIAGNOSIS — Y92009 Unspecified place in unspecified non-institutional (private) residence as the place of occurrence of the external cause: Secondary | ICD-10-CM | POA: Diagnosis not present

## 2018-01-02 DIAGNOSIS — M25462 Effusion, left knee: Secondary | ICD-10-CM | POA: Diagnosis not present

## 2018-01-02 DIAGNOSIS — I428 Other cardiomyopathies: Secondary | ICD-10-CM | POA: Diagnosis not present

## 2018-01-02 DIAGNOSIS — N179 Acute kidney failure, unspecified: Secondary | ICD-10-CM | POA: Diagnosis not present

## 2018-01-02 DIAGNOSIS — I5022 Chronic systolic (congestive) heart failure: Secondary | ICD-10-CM | POA: Diagnosis not present

## 2018-01-02 DIAGNOSIS — Q210 Ventricular septal defect: Secondary | ICD-10-CM | POA: Diagnosis not present

## 2018-01-02 DIAGNOSIS — Z7401 Bed confinement status: Secondary | ICD-10-CM | POA: Diagnosis not present

## 2018-01-02 DIAGNOSIS — E785 Hyperlipidemia, unspecified: Secondary | ICD-10-CM | POA: Diagnosis not present

## 2018-01-02 DIAGNOSIS — R262 Difficulty in walking, not elsewhere classified: Secondary | ICD-10-CM | POA: Diagnosis not present

## 2018-01-02 DIAGNOSIS — I700 Atherosclerosis of aorta: Secondary | ICD-10-CM | POA: Diagnosis present

## 2018-01-02 DIAGNOSIS — I340 Nonrheumatic mitral (valve) insufficiency: Secondary | ICD-10-CM | POA: Diagnosis present

## 2018-01-02 DIAGNOSIS — H409 Unspecified glaucoma: Secondary | ICD-10-CM | POA: Diagnosis not present

## 2018-01-02 DIAGNOSIS — R1312 Dysphagia, oropharyngeal phase: Secondary | ICD-10-CM | POA: Diagnosis not present

## 2018-01-02 DIAGNOSIS — I480 Paroxysmal atrial fibrillation: Secondary | ICD-10-CM | POA: Diagnosis not present

## 2018-01-02 DIAGNOSIS — I429 Cardiomyopathy, unspecified: Secondary | ICD-10-CM | POA: Diagnosis not present

## 2018-01-02 DIAGNOSIS — Z79899 Other long term (current) drug therapy: Secondary | ICD-10-CM | POA: Diagnosis not present

## 2018-01-02 DIAGNOSIS — N183 Chronic kidney disease, stage 3 (moderate): Secondary | ICD-10-CM | POA: Diagnosis present

## 2018-01-02 DIAGNOSIS — M2550 Pain in unspecified joint: Secondary | ICD-10-CM | POA: Diagnosis not present

## 2018-01-02 DIAGNOSIS — Z01810 Encounter for preprocedural cardiovascular examination: Secondary | ICD-10-CM | POA: Diagnosis not present

## 2018-01-02 DIAGNOSIS — S82899A Other fracture of unspecified lower leg, initial encounter for closed fracture: Secondary | ICD-10-CM | POA: Diagnosis present

## 2018-01-02 DIAGNOSIS — S82842D Displaced bimalleolar fracture of left lower leg, subsequent encounter for closed fracture with routine healing: Secondary | ICD-10-CM | POA: Diagnosis not present

## 2018-01-02 DIAGNOSIS — M2681 Anterior soft tissue impingement: Secondary | ICD-10-CM | POA: Diagnosis not present

## 2018-01-02 DIAGNOSIS — W010XXA Fall on same level from slipping, tripping and stumbling without subsequent striking against object, initial encounter: Secondary | ICD-10-CM | POA: Diagnosis present

## 2018-01-02 DIAGNOSIS — N184 Chronic kidney disease, stage 4 (severe): Secondary | ICD-10-CM | POA: Diagnosis not present

## 2018-01-02 DIAGNOSIS — K219 Gastro-esophageal reflux disease without esophagitis: Secondary | ICD-10-CM | POA: Diagnosis present

## 2018-01-02 DIAGNOSIS — M109 Gout, unspecified: Secondary | ICD-10-CM | POA: Diagnosis not present

## 2018-01-02 DIAGNOSIS — Z95810 Presence of automatic (implantable) cardiac defibrillator: Secondary | ICD-10-CM | POA: Diagnosis not present

## 2018-01-02 DIAGNOSIS — I959 Hypotension, unspecified: Secondary | ICD-10-CM | POA: Diagnosis not present

## 2018-01-02 DIAGNOSIS — E039 Hypothyroidism, unspecified: Secondary | ICD-10-CM | POA: Diagnosis not present

## 2018-01-02 DIAGNOSIS — Z7901 Long term (current) use of anticoagulants: Secondary | ICD-10-CM | POA: Diagnosis not present

## 2018-01-02 DIAGNOSIS — S82891A Other fracture of right lower leg, initial encounter for closed fracture: Secondary | ICD-10-CM | POA: Diagnosis not present

## 2018-01-02 DIAGNOSIS — R791 Abnormal coagulation profile: Secondary | ICD-10-CM | POA: Diagnosis not present

## 2018-01-02 LAB — CBC
HCT: 44.7 % (ref 36.0–46.0)
Hemoglobin: 14.4 g/dL (ref 12.0–15.0)
MCH: 33.6 pg (ref 26.0–34.0)
MCHC: 32.2 g/dL (ref 30.0–36.0)
MCV: 104.2 fL — ABNORMAL HIGH (ref 78.0–100.0)
PLATELETS: 118 10*3/uL — AB (ref 150–400)
RBC: 4.29 MIL/uL (ref 3.87–5.11)
RDW: 11.9 % (ref 11.5–15.5)
WBC: 7.4 10*3/uL (ref 4.0–10.5)

## 2018-01-02 LAB — BASIC METABOLIC PANEL
Anion gap: 10 (ref 5–15)
BUN: 51 mg/dL — ABNORMAL HIGH (ref 8–23)
CO2: 21 mmol/L — ABNORMAL LOW (ref 22–32)
Calcium: 9.4 mg/dL (ref 8.9–10.3)
Chloride: 112 mmol/L — ABNORMAL HIGH (ref 98–111)
Creatinine, Ser: 2.58 mg/dL — ABNORMAL HIGH (ref 0.44–1.00)
GFR calc Af Amer: 19 mL/min — ABNORMAL LOW (ref >60)
GFR calc non Af Amer: 16 mL/min — ABNORMAL LOW (ref >60)
Glucose, Bld: 127 mg/dL — ABNORMAL HIGH (ref 70–99)
POTASSIUM: 4.3 mmol/L (ref 3.5–5.1)
Sodium: 143 mmol/L (ref 135–145)

## 2018-01-02 LAB — ABO/RH: ABO/RH(D): O POS

## 2018-01-02 LAB — TYPE AND SCREEN
ABO/RH(D): O POS
ANTIBODY SCREEN: NEGATIVE

## 2018-01-02 MED ORDER — HYDRALAZINE HCL 25 MG PO TABS
37.5000 mg | ORAL_TABLET | Freq: Three times a day (TID) | ORAL | Status: DC
  Administered 2018-01-02 (×2): 37.5 mg via ORAL
  Filled 2018-01-02 (×6): qty 2

## 2018-01-02 MED ORDER — TORSEMIDE 20 MG PO TABS
60.0000 mg | ORAL_TABLET | Freq: Every day | ORAL | Status: DC
  Administered 2018-01-02 – 2018-01-09 (×7): 60 mg via ORAL
  Filled 2018-01-02 (×8): qty 3

## 2018-01-02 MED ORDER — LATANOPROST 0.005 % OP SOLN
1.0000 [drp] | Freq: Every day | OPHTHALMIC | Status: DC
Start: 2018-01-02 — End: 2018-01-09
  Administered 2018-01-03 – 2018-01-08 (×3): 1 [drp] via OPHTHALMIC
  Filled 2018-01-02 (×2): qty 2.5

## 2018-01-02 MED ORDER — LEVOTHYROXINE SODIUM 75 MCG PO TABS
75.0000 ug | ORAL_TABLET | Freq: Every day | ORAL | Status: DC
  Administered 2018-01-02 – 2018-01-09 (×8): 75 ug via ORAL
  Filled 2018-01-02 (×8): qty 1

## 2018-01-02 MED ORDER — POTASSIUM CHLORIDE CRYS ER 20 MEQ PO TBCR
20.0000 meq | EXTENDED_RELEASE_TABLET | Freq: Every day | ORAL | Status: DC
  Administered 2018-01-02 – 2018-01-09 (×8): 20 meq via ORAL
  Filled 2018-01-02 (×8): qty 1

## 2018-01-02 MED ORDER — DORZOLAMIDE HCL 2 % OP SOLN
1.0000 [drp] | Freq: Three times a day (TID) | OPHTHALMIC | Status: DC
  Administered 2018-01-03 – 2018-01-09 (×18): 1 [drp] via OPHTHALMIC
  Filled 2018-01-02: qty 10

## 2018-01-02 MED ORDER — ONDANSETRON HCL 4 MG/2ML IJ SOLN
4.0000 mg | Freq: Four times a day (QID) | INTRAMUSCULAR | Status: DC | PRN
Start: 2018-01-02 — End: 2018-01-09

## 2018-01-02 MED ORDER — METOPROLOL SUCCINATE ER 50 MG PO TB24
50.0000 mg | ORAL_TABLET | Freq: Every day | ORAL | Status: DC
  Administered 2018-01-02 – 2018-01-09 (×6): 50 mg via ORAL
  Filled 2018-01-02 (×9): qty 1

## 2018-01-02 MED ORDER — ADULT MULTIVITAMIN W/MINERALS CH
1.0000 | ORAL_TABLET | Freq: Every day | ORAL | Status: DC
  Administered 2018-01-02 – 2018-01-09 (×8): 1 via ORAL
  Filled 2018-01-02 (×9): qty 1

## 2018-01-02 MED ORDER — ACETAMINOPHEN 650 MG RE SUPP: 650.0000 mg | Freq: Four times a day (QID) | RECTAL | Status: DC | PRN

## 2018-01-02 MED ORDER — ISOSORBIDE MONONITRATE ER 30 MG PO TB24
30.0000 mg | ORAL_TABLET | Freq: Every day | ORAL | Status: DC
  Administered 2018-01-02 – 2018-01-09 (×7): 30 mg via ORAL
  Filled 2018-01-02 (×8): qty 1

## 2018-01-02 MED ORDER — AMIODARONE HCL 100 MG PO TABS
200.0000 mg | ORAL_TABLET | Freq: Every day | ORAL | Status: DC
  Administered 2018-01-02 – 2018-01-09 (×7): 200 mg via ORAL
  Filled 2018-01-02 (×8): qty 2

## 2018-01-02 MED ORDER — ACETAMINOPHEN 325 MG PO TABS
650.0000 mg | ORAL_TABLET | Freq: Four times a day (QID) | ORAL | Status: DC | PRN
  Administered 2018-01-02 – 2018-01-03 (×3): 650 mg via ORAL
  Filled 2018-01-02 (×3): qty 2

## 2018-01-02 MED ORDER — ONDANSETRON HCL 4 MG PO TABS: 4.0000 mg | ORAL_TABLET | Freq: Four times a day (QID) | ORAL | Status: DC | PRN

## 2018-01-02 MED ORDER — FENTANYL CITRATE (PF) 100 MCG/2ML IJ SOLN: 25.0000 ug | INTRAMUSCULAR | Status: DC | PRN

## 2018-01-02 NOTE — Progress Notes (Signed)
Orthopedic Tech Progress Note Patient Details:  AMAANI GUILBAULT 1935/02/20 [id number]  Ortho Devices Type of Ortho Device: Stirrup splint, Post (short leg) splint Ortho Device/Splint Location: lle Ortho Device/Splint Interventions: Ordered, Application, Adjustment   Post Interventions Patient Tolerated: Well Instructions Provided: Care of device, Adjustment of device   Karolee Stamps 01/02/2018, 12:27 AM

## 2018-01-02 NOTE — H&P (Signed)
History and Physical    Casey [name] ZJQ:[id number] DOB: 03/26/1935 DOA: 01/01/2018  PCP: Elby Showers, MD  Patient coming from: Home.  Chief Complaint: Fall.  HPI: Casey [name] is a 83 y.o. female with history of chronic systolic heart failure status post defibrillator placement and pacemaker, atrial fibrillation, chronic kidney disease stage IV, hypertension, gout who has had recent acute gouty attacks of left ankle and is on colchicine and has been having increasing bowel movements and had gone to the bathroom and on the way to the room patient felt dizzy and fell.  Patient states she got entangled on a cloth on the way back.  Denies losing consciousness hormone having any chest pain or palpitation.  She hurt her left ankle and was brought to the ER.  ED Course: X-rays reveal fracture of the left ankle with displacement.  On-call orthopedic surgeon Dr. Marlou Sa was consulted.  Since patient has multiple medical issues requested a hospital admission.  My exam patient is not in distress denies any chest pain or shortness of breath.  Review of Systems: As per HPI, rest all negative.   Past Medical History:  Diagnosis Date  . Atherosclerosis of aorta ([place])   . Biventricular ICD (implantable cardiac defibrillator) in place    medtronic   . Chronic kidney disease (CKD), stage III (moderate) (HCC)   . Chronic systolic heart failure ([place])   . Gout   . History of peptic ulcer disease   . Hypertension   . Hypothyroidism   . Lumbar disc disease   . Nonischemic cardiomyopathy ([place])   . Ventricular septal defect   . VT (ventricular tachycardia) ([place])    appropriate therapy via ICD 2013    Past Surgical History:  Procedure Laterality Date  . ABDOMINAL HYSTERECTOMY    . BIV ICD GENERTAOR CHANGE OUT N/A 04/16/2013   Procedure: BIV ICD GENERTAOR CHANGE OUT;  Surgeon: Deboraha Sprang, MD;  Location: [facility] CATH LAB;  Service: Cardiovascular;  Laterality: N/A;  . CESAREAN SECTION     . FEMUR IM NAIL    . TEE WITHOUT CARDIOVERSION N/A 10/31/2017   Procedure: TRANSESOPHAGEAL ECHOCARDIOGRAM (TEE);  Surgeon: Larey Dresser, MD;  Location: [facility] ENDOSCOPY;  Service: Cardiovascular;  Laterality: N/A;     reports that she has never smoked. She has never used smokeless tobacco. She reports that she does not drink alcohol or use drugs.  Allergies  Allergen Reactions  . Codeine Rash    Family History  Problem Relation Age of Onset  . Heart failure Mother   . Cancer Father     Prior to Admission medications   Medication Sig Start Date End Date Taking? Authorizing Provider  amiodarone (PACERONE) 200 MG tablet Take 200 mg by mouth daily.   Yes [provider]  colchicine 0.6 MG tablet Take 1 tablet (0.6 mg total) by mouth daily as needed. 12/21/17  Yes Larey Dresser, MD  dorzolamide (TRUSOPT) 2 % ophthalmic solution Place 1 drop into both eyes 3 (three) times daily.   Yes [provider]  hydrALAZINE (APRESOLINE) 25 MG tablet Take 1.5 tablets (37.5 mg total) by mouth 3 (three) times daily. 10/23/17  Yes Larey Dresser, MD  isosorbide mononitrate (IMDUR) 30 MG 24 hr tablet Take 1 tablet (30 mg total) by mouth daily. 07/12/17  Yes Georgiana Shore, NP  Latanoprost 0.005 % EMUL Place 1 drop into both eyes at bedtime.    Yes [provider]  levothyroxine (SYNTHROID,  LEVOTHROID) 75 MCG tablet Take 75 mcg by mouth daily before breakfast.    Yes [provider]  metoprolol succinate (TOPROL-XL) 50 MG 24 hr tablet Take 1 tablet (50 mg total) by mouth daily. 12/21/17  Yes Larey Dresser, MD  Multiple Vitamins-Calcium (ONE-A-DAY WOMENS FORMULA) TABS Take 1 tablet by mouth daily.     Yes [provider]  potassium chloride SA (K-DUR,KLOR-CON) 20 MEQ tablet Take 20 mEq by mouth daily.    Yes [provider]  torsemide (DEMADEX) 20 MG tablet Take 40 mg (2 tabs) daily, alternating with 20 mg (1 tab) every other day Patient taking  differently: Take 60 mg by mouth daily. . 12/26/17  Yes Larey Dresser, MD  warfarin (COUMADIN) 2 MG tablet Take 1 tablet (2 mg total) by mouth daily. 07/12/17  Yes Georgiana Shore, NP  allopurinol (ZYLOPRIM) 100 MG tablet TAKE 1 TABLET BY MOUTH EVERY DAY Patient not taking: Reported on 01/01/2018 05/02/16   Elby Showers, MD    Physical Exam: Vitals:   01/02/18 0000 01/02/18 0045 01/02/18 0100 01/02/18 0115  BP: 125/73 129/70 135/66 130/64  Pulse: 71 69 69 66  Resp: 14 (!) 22 (!) 25 11  Temp:      SpO2: 100% 99% 98% 99%  Weight:      Height:          Constitutional: Moderately built and nourished. Vitals:   01/02/18 0000 01/02/18 0045 01/02/18 0100 01/02/18 0115  BP: 125/73 129/70 135/66 130/64  Pulse: 71 69 69 66  Resp: 14 (!) 22 (!) 25 11  Temp:      SpO2: 100% 99% 98% 99%  Weight:      Height:       Eyes: Anicteric no pallor. ENMT: No discharge from the ears eyes nose or mouth. Neck: No mass felt.  No JVD appreciated. Respiratory: No rhonchi or crepitations. Cardiovascular: S1-S2 heard no murmurs appreciated. Abdomen: Soft nontender bowel sounds present. Musculoskeletal: Left ankle in cast. Skin: No rash. Neurologic: Alert awake oriented to time place and person.  Moves all extremities. Psychiatric: Appears normal per normal affect.   Labs on Admission: I have personally reviewed following labs and imaging studies  CBC: Recent Labs  Lab 01/01/18 2004  WBC 5.7  NEUTROABS 3.7  HGB 14.1  HCT 44.6  MCV 107.0*  PLT 810*   Basic Metabolic Panel: Recent Labs  Lab 01/01/18 2004  NA 144  K 4.2  CL 113*  CO2 21*  GLUCOSE 126*  BUN 54*  CREATININE 2.93*  CALCIUM 9.5   GFR: Estimated Creatinine Clearance: 14 mL/min (A) (by C-G formula based on SCr of 2.93 mg/dL (H)). Liver Function Tests: No results for input(s): AST, ALT, ALKPHOS, BILITOT, PROT, ALBUMIN in the last 168 hours. No results for input(s): LIPASE, AMYLASE in the last 168 hours. No results  for input(s): AMMONIA in the last 168 hours. Coagulation Profile: Recent Labs  Lab 01/01/18 2004  INR 2.22   Cardiac Enzymes: No results for input(s): CKTOTAL, CKMB, CKMBINDEX, TROPONINI in the last 168 hours. BNP (last 3 results) No results for input(s): PROBNP in the last 8760 hours. HbA1C: No results for input(s): HGBA1C in the last 72 hours. CBG: No results for input(s): GLUCAP in the last 168 hours. Lipid Profile: No results for input(s): CHOL, HDL, LDLCALC, TRIG, CHOLHDL, LDLDIRECT in the last 72 hours. Thyroid Function Tests: No results for input(s): TSH, T4TOTAL, FREET4, T3FREE, THYROIDAB in the last 72 hours. Anemia Panel:  No results for input(s): VITAMINB12, FOLATE, FERRITIN, TIBC, IRON, RETICCTPCT in the last 72 hours. Urine analysis:    Component Value Date/Time   COLORURINE YELLOW 12/17/2007 1501   APPEARANCEUR CLEAR 12/17/2007 1501   LABSPEC 1.014 12/17/2007 1501   PHURINE 5.5 12/17/2007 1501   GLUCOSEU NEGATIVE 12/17/2007 1501   HGBUR NEGATIVE 12/17/2007 1501   BILIRUBINUR neg 06/10/2015 1019   KETONESUR NEGATIVE 12/17/2007 1501   PROTEINUR neg 06/10/2015 1019   PROTEINUR NEGATIVE 12/17/2007 1501   UROBILINOGEN 0.2 06/10/2015 1019   UROBILINOGEN 1.0 12/17/2007 1501   NITRITE neg 06/10/2015 1019   NITRITE NEGATIVE 12/17/2007 1501   LEUKOCYTESUR Negative 06/10/2015 1019   Sepsis Labs: @LABRCNTIP (procalcitonin:4,lacticidven:4) )No results found for this or any previous visit (from the past 240 hour(s)).   Radiological Exams on Admission: Dg Ankle Complete Left  Result Date: 01/01/2018 CLINICAL DATA:  Post reduction EXAM: LEFT ANKLE COMPLETE - 3+ VIEW COMPARISON:  01/01/2018 FINDINGS: Placement of cast material obscures bone detail. Partially visualized surgical rod within the distal tibia. Old fracture deformity of the distal shaft of the tibia. Acute medial malleolar fracture with mild displacement and slight asymmetric widening of the superior medial  mortise. Acute distal fibular fracture with residual 1/4 bone with of lateral displacement of distal fracture fragment. Suspected posterior malleolar fracture with mild residual displacement. Residual widened appearance of the anterior joint space with anterior avulsion fracture off the distal tibia. IMPRESSION: 1. Interim casting of the left ankle 2. Acute mildly displaced medial and lateral malleolar fractures. Suspected posterior malleolar fracture with mild displacement 3. Residual widening of the anterior ankle joint with mild posterior subluxation of talar dome. Probable avulsion fracture off the distal anterior tibia. Electronically Signed   By: Donavan Foil M.D.   On: 01/01/2018 23:58   Dg Ankle Complete Left  Result Date: 01/01/2018 CLINICAL DATA:  83 year old with left lower leg injury after fall. Dislocation. EXAM: LEFT ANKLE COMPLETE - 3+ VIEW COMPARISON:  None. FINDINGS: Intramedullary rod in the distal tibia related to old fracture. Displaced fractures involving the medial malleolus and lateral malleolus. Disruption of the ankle mortise. Soft tissue swelling associated with the fractures. There is at least posterior subluxation and possibly dislocation of the ankle joint. Prominent plantar calcaneal spur. IMPRESSION: Left ankle fractures with disruption of the ankle mortise. Posterior subluxation or dislocation at the ankle joint. Electronically Signed   By: Markus Daft M.D.   On: 01/01/2018 21:31   Ct Head Wo Contrast  Result Date: 01/01/2018 CLINICAL DATA:  Fall after dizziness.  No loss of consciousness. EXAM: CT HEAD WITHOUT CONTRAST TECHNIQUE: Contiguous axial images were obtained from the base of the skull through the vertex without intravenous contrast. COMPARISON:  None. FINDINGS: Brain: Mild diffuse cortical atrophy is noted. Mild chronic ischemic white matter disease is noted. No mass effect or midline shift is noted. Ventricular size is within normal limits. There is no evidence of  mass lesion, hemorrhage or acute infarction. Vascular: No hyperdense vessel or unexpected calcification. Skull: Normal. Negative for fracture or focal lesion. Sinuses/Orbits: No acute finding. Other: None. IMPRESSION: Mild diffuse cortical atrophy. Mild chronic ischemic white matter disease. No acute intracranial abnormality seen. Electronically Signed   By: Marijo Conception, M.D.   On: 01/01/2018 22:36    EKG: Independently reviewed.  Paced rhythm.  Assessment/Plan Principal Problem:   Closed left ankle fracture Active Problems:   Biventricular implantable cardioverter-defibrillator in situ   Nonischemic cardiomyopathy (HCC)   Paroxysmal atrial fibrillation ([place])  Gout   Hypothyroidism   Kidney disease, chronic, stage III (GFR 30-59 ml/min) (HCC)   Ventricular septal defect (VSD)   Ankle fracture    1. Closed left ankle fracture status post mechanical fall -Dr. Marlou Sa on-call orthopedic surgeon has been consulted.  Plan is to have surgery done on January 03, 2018 after INR is in the agreeable range.  Coumadin on hold.  Pain relief medications. 2. Paroxysmal atrial fibrillation on Coumadin which is on hold.  Patient agreeable to hold.  On metoprolol for rate control. 3. Hypertension on Imdur hydralazine and metoprolol. 4. Chronic systolic heart failure and VSD and MR on torsemide hydralazine and Imdur.  Appears compensated.  May have to hold torsemide on the day of the surgery. 5. Recent gouty attack.  Was on colchicine.  May need steroids. 6. Chronic kidney disease stage IV-V.  Follow metabolic panel. 7. Hypothyroidism on Synthroid.   DVT prophylaxis: SCDs. Code Status: Full code. Family Communication: No family at the bedside. Disposition Plan: Home.  May need rehab. Consults called: Orthopedics. Admission status: Inpatient.   Rise Patience MD [facility] Pager [phone].  If 7PM-7AM, please contact night-coverage [email or web address] Password [email or web address]  01/02/2018, 1:23  AM

## 2018-01-02 NOTE — Consult Note (Signed)
Reason for Consult:Ankle fx Referring Physician: A Jezebelle Ledwell is an 83 y.o. female.  HPI: Latreshia was coming back from her bathroom yesterday when she got a little dizzy and tripped over a towel.She had immediate ankle pain and deformity. She came to the ED for evaluation where x-rays confirmed a likely trimalleolar left ankle fx. She was reduced and splinted and orthopedic surgery was consulted. Aside from the localized pain she is without c/o.   Past Medical History:  Diagnosis Date  . Atherosclerosis of aorta ([place])   . Biventricular ICD (implantable cardiac defibrillator) in place    medtronic   . Chronic kidney disease (CKD), stage III (moderate) (HCC)   . Chronic systolic heart failure ([place])   . Gout   . History of peptic ulcer disease   . Hypertension   . Hypothyroidism   . Lumbar disc disease   . Nonischemic cardiomyopathy ([place])   . Ventricular septal defect   . VT (ventricular tachycardia) ([place])    appropriate therapy via ICD 2013    Past Surgical History:  Procedure Laterality Date  . ABDOMINAL HYSTERECTOMY    . BIV ICD GENERTAOR CHANGE OUT N/A 04/16/2013   Procedure: BIV ICD GENERTAOR CHANGE OUT;  Surgeon: Deboraha Sprang, MD;  Location: [facility] CATH LAB;  Service: Cardiovascular;  Laterality: N/A;  . CESAREAN SECTION    . FEMUR IM NAIL    . TEE WITHOUT CARDIOVERSION N/A 10/31/2017   Procedure: TRANSESOPHAGEAL ECHOCARDIOGRAM (TEE);  Surgeon: Larey Dresser, MD;  Location: [facility] ENDOSCOPY;  Service: Cardiovascular;  Laterality: N/A;    Family History  Problem Relation Age of Onset  . Heart failure Mother   . Cancer Father     Social History:  reports that she has never smoked. She has never used smokeless tobacco. She reports that she does not drink alcohol or use drugs.  Allergies:  Allergies  Allergen Reactions  . Codeine Rash    Medications: I have reviewed the patient's current medications.  Results for orders placed or performed during  the hospital encounter of 01/01/18 (from the past 48 hour(s))  CBC with Differential     Status: Abnormal   Collection Time: 01/01/18  8:04 PM  Result Value Ref Range   WBC 5.7 4.0 - 10.5 K/uL   RBC 4.17 3.87 - 5.11 MIL/uL   Hemoglobin 14.1 12.0 - 15.0 g/dL   HCT 44.6 36.0 - 46.0 %   MCV 107.0 (H) 78.0 - 100.0 fL   MCH 33.8 26.0 - 34.0 pg   MCHC 31.6 30.0 - 36.0 g/dL   RDW 11.9 11.5 - 15.5 %   Platelets 118 (L) 150 - 400 K/uL    Comment: REPEATED TO VERIFY SPECIMEN CHECKED FOR CLOTS PLATELET COUNT CONFIRMED BY SMEAR    Neutrophils Relative % 64 %   Neutro Abs 3.7 1.7 - 7.7 K/uL   Lymphocytes Relative 18 %   Lymphs Abs 1.0 0.7 - 4.0 K/uL   Monocytes Relative 14 %   Monocytes Absolute 0.8 0.1 - 1.0 K/uL   Eosinophils Relative 3 %   Eosinophils Absolute 0.2 0.0 - 0.7 K/uL   Basophils Relative 1 %   Basophils Absolute 0.1 0.0 - 0.1 K/uL   Immature Granulocytes 0 %   Abs Immature Granulocytes 0.0 0.0 - 0.1 K/uL    Comment: Performed at [facility], [address] [place], [place] [address]  Basic metabolic panel     Status: Abnormal   Collection Time:  01/01/18  8:04 PM  Result Value Ref Range   Sodium 144 135 - 145 mmol/L   Potassium 4.2 3.5 - 5.1 mmol/L   Chloride 113 (H) 98 - 111 mmol/L   CO2 21 (L) 22 - 32 mmol/L   Glucose, Bld 126 (H) 70 - 99 mg/dL   BUN 54 (H) 8 - 23 mg/dL   Creatinine, Ser 2.93 (H) 0.44 - 1.00 mg/dL   Calcium 9.5 8.9 - 10.3 mg/dL   GFR calc non Af Amer 14 (L) >60 mL/min   GFR calc Af Amer 16 (L) >60 mL/min    Comment: (NOTE) The eGFR has been calculated using the CKD EPI equation. This calculation has not been validated in all clinical situations. eGFR's persistently <60 mL/min signify possible Chronic Kidney Disease.    Anion gap 10 5 - 15    Comment: Performed at [facility] [address], [place], [place] [address]  Protime-INR     Status: Abnormal   Collection Time: 01/01/18  8:04 PM  Result Value Ref Range   Prothrombin  Time 24.4 (H) 11.4 - 15.2 seconds   INR 2.22     Comment: Performed at [facility] [address] [place], [place] [address]  Type and screen [place]     Status: None   Collection Time: 01/02/18  3:21 AM  Result Value Ref Range   ABO/RH(D) O POS    Antibody Screen NEG    Sample Expiration      01/05/2018 Performed at [facility], [place] [address], [place], [place] [address]   ABO/Rh     Status: None   Collection Time: 01/02/18  3:21 AM  Result Value Ref Range   ABO/RH(D)      O POS Performed at [facility] [address], [place], [place] [address]   Basic metabolic panel     Status: Abnormal   Collection Time: 01/02/18  3:23 AM  Result Value Ref Range   Sodium 143 135 - 145 mmol/L   Potassium 4.3 3.5 - 5.1 mmol/L   Chloride 112 (H) 98 - 111 mmol/L   CO2 21 (L) 22 - 32 mmol/L   Glucose, Bld 127 (H) 70 - 99 mg/dL   BUN 51 (H) 8 - 23 mg/dL   Creatinine, Ser 2.58 (H) 0.44 - 1.00 mg/dL   Calcium 9.4 8.9 - 10.3 mg/dL   GFR calc non Af Amer 16 (L) >60 mL/min   GFR calc Af Amer 19 (L) >60 mL/min    Comment: (NOTE) The eGFR has been calculated using the CKD EPI equation. This calculation has not been validated in all clinical situations. eGFR's persistently <60 mL/min signify possible Chronic Kidney Disease.    Anion gap 10 5 - 15    Comment: Performed at [facility] [address] [place], [place] [address]  CBC     Status: Abnormal   Collection Time: 01/02/18  3:23 AM  Result Value Ref Range   WBC 7.4 4.0 - 10.5 K/uL   RBC 4.29 3.87 - 5.11 MIL/uL   Hemoglobin 14.4 12.0 - 15.0 g/dL   HCT 44.7 36.0 - 46.0 %   MCV 104.2 (H) 78.0 - 100.0 fL   MCH 33.6 26.0 - 34.0 pg   MCHC 32.2 30.0 - 36.0 g/dL   RDW 11.9 11.5 - 15.5 %   Platelets 118 (L) 150 - 400 K/uL    Comment: SPECIMEN CHECKED FOR  CLOTS CONSISTENT WITH PREVIOUS RESULT Performed at [facility], [place] [address], [place], [place] [address]     Dg Ankle  Complete Left  Result Date: 01/01/2018 CLINICAL DATA:  Post reduction EXAM: LEFT ANKLE COMPLETE - 3+ VIEW COMPARISON:  01/01/2018 FINDINGS: Placement of cast material obscures bone detail. Partially visualized surgical rod within the distal tibia. Old fracture deformity of the distal shaft of the tibia. Acute medial malleolar fracture with mild displacement and slight asymmetric widening of the superior medial mortise. Acute distal fibular fracture with residual 1/4 bone with of lateral displacement of distal fracture fragment. Suspected posterior malleolar fracture with mild residual displacement. Residual widened appearance of the anterior joint space with anterior avulsion fracture off the distal tibia. IMPRESSION: 1. Interim casting of the left ankle 2. Acute mildly displaced medial and lateral malleolar fractures. Suspected posterior malleolar fracture with mild displacement 3. Residual widening of the anterior ankle joint with mild posterior subluxation of talar dome. Probable avulsion fracture off the distal anterior tibia. Electronically Signed   By: Donavan Foil M.D.   On: 01/01/2018 23:58   Dg Ankle Complete Left  Result Date: 01/01/2018 CLINICAL DATA:  83 year old with left lower leg injury after fall. Dislocation. EXAM: LEFT ANKLE COMPLETE - 3+ VIEW COMPARISON:  None. FINDINGS: Intramedullary rod in the distal tibia related to old fracture. Displaced fractures involving the medial malleolus and lateral malleolus. Disruption of the ankle mortise. Soft tissue swelling associated with the fractures. There is at least posterior subluxation and possibly dislocation of the ankle joint. Prominent plantar calcaneal spur. IMPRESSION: Left ankle fractures with disruption of the ankle mortise. Posterior subluxation or dislocation at the ankle joint. Electronically Signed   By: Markus Daft M.D.   On: 01/01/2018 21:31   Ct Head Wo Contrast  Result Date: 01/01/2018 CLINICAL DATA:  Fall after dizziness.  No  loss of consciousness. EXAM: CT HEAD WITHOUT CONTRAST TECHNIQUE: Contiguous axial images were obtained from the base of the skull through the vertex without intravenous contrast. COMPARISON:  None. FINDINGS: Brain: Mild diffuse cortical atrophy is noted. Mild chronic ischemic white matter disease is noted. No mass effect or midline shift is noted. Ventricular size is within normal limits. There is no evidence of mass lesion, hemorrhage or acute infarction. Vascular: No hyperdense vessel or unexpected calcification. Skull: Normal. Negative for fracture or focal lesion. Sinuses/Orbits: No acute finding. Other: None. IMPRESSION: Mild diffuse cortical atrophy. Mild chronic ischemic white matter disease. No acute intracranial abnormality seen. Electronically Signed   By: Marijo Conception, M.D.   On: 01/01/2018 22:36    Review of Systems  Constitutional: Negative for weight loss.  HENT: Negative for ear discharge, ear pain, hearing loss and tinnitus.   Eyes: Negative for blurred vision, double vision, photophobia and pain.  Respiratory: Negative for cough, sputum production and shortness of breath.   Cardiovascular: Negative for chest pain.  Gastrointestinal: Negative for abdominal pain, nausea and vomiting.  Genitourinary: Negative for dysuria, flank pain, frequency and urgency.  Musculoskeletal: Positive for joint pain (Left ankle). Negative for back pain, falls, myalgias and neck pain.  Neurological: Negative for dizziness, tingling, sensory change, focal weakness, loss of consciousness and headaches.  Endo/Heme/Allergies: Does not bruise/bleed easily.  Psychiatric/Behavioral: Negative for depression, memory loss and substance abuse. The patient is not nervous/anxious.    Blood pressure 111/63, pulse 70, temperature 98.3 F (36.8 C), temperature source Oral, resp. rate 14, height 5' 2" (1.575 m), weight 72.6 kg (160 lb), SpO2 100 %.  Physical Exam  Constitutional: She appears well-developed and  well-nourished. No distress.  HENT:  Head: Normocephalic and atraumatic.  Eyes: Conjunctivae are normal. Right eye exhibits no discharge. Left eye exhibits no discharge. No scleral icterus.  Neck: Normal range of motion.  Cardiovascular: Normal rate and regular rhythm.  Respiratory: Effort normal. No respiratory distress.  Musculoskeletal:  LLE No traumatic wounds, ecchymosis, or rash  Ankle in splint, mild TTP fibular head  No knee or ankle effusion  Knee stable to varus/ valgus and anterior/posterior stress  Sens DPN, SPN, TN intact  Motor EHL 5/5  Neurological: She is alert.  Skin: Skin is warm and dry. She is not diaphoretic.  Psychiatric: She has a normal mood and affect. Her behavior is normal.    Assessment/Plan: Left ankle fx -- Plan for ORIF tomorrow afternoon ~1600 with Dr. Marlou Sa. NWB, NPO after MN. INR in AM, may need reversal if still high. Check knee films.    Lisette Abu, PA-C Orthopedic Surgery [phone] 01/02/2018, 10:04 AM

## 2018-01-02 NOTE — Progress Notes (Signed)
Casey [name] is a 83 y.o. female with history of chronic systolic heart failure status post defibrillator placement and pacemaker, atrial fibrillation, chronic kidney disease stage IV, hypertension, gout who has had recent acute gouty attacks of left ankle and is on colchicine and has been having increasing bowel movements and had gone to the bathroom and on the way to the room patient felt dizzy and fell.  Patient states she got entangled on a cloth on the way back.  Denies losing consciousness hormone having any chest pain or palpitation.  She hurt her left ankle and was brought to the ER.  01/02/18: Seen and examined at her bedside. Pain is well controlled. No new complaints.  INR : 2.22. Plan for ORIF tomorrow 01/03/18.  Please refer to H&P dictated by Dr Glyn Ade on 01/02/18 for further details of the assessment and plan.

## 2018-01-03 DIAGNOSIS — S82842A Displaced bimalleolar fracture of left lower leg, initial encounter for closed fracture: Principal | ICD-10-CM

## 2018-01-03 DIAGNOSIS — M109 Gout, unspecified: Secondary | ICD-10-CM

## 2018-01-03 LAB — SURGICAL PCR SCREEN
MRSA, PCR: NEGATIVE
STAPHYLOCOCCUS AUREUS: POSITIVE — AB

## 2018-01-03 LAB — BASIC METABOLIC PANEL
Anion gap: 9 (ref 5–15)
BUN: 47 mg/dL — ABNORMAL HIGH (ref 8–23)
CHLORIDE: 113 mmol/L — AB (ref 98–111)
CO2: 24 mmol/L (ref 22–32)
Calcium: 9.8 mg/dL (ref 8.9–10.3)
Creatinine, Ser: 2.75 mg/dL — ABNORMAL HIGH (ref 0.44–1.00)
GFR calc non Af Amer: 15 mL/min — ABNORMAL LOW (ref >60)
GFR, EST AFRICAN AMERICAN: 18 mL/min — AB (ref >60)
Glucose, Bld: 120 mg/dL — ABNORMAL HIGH (ref 70–99)
POTASSIUM: 3.9 mmol/L (ref 3.5–5.1)
SODIUM: 146 mmol/L — AB (ref 135–145)

## 2018-01-03 LAB — PROTIME-INR
INR: 2.42
Prothrombin Time: 26.1 seconds — ABNORMAL HIGH (ref 11.4–15.2)

## 2018-01-03 LAB — CBC
HCT: 42 % (ref 36.0–46.0)
HEMOGLOBIN: 13.7 g/dL (ref 12.0–15.0)
MCH: 33.5 pg (ref 26.0–34.0)
MCHC: 32.6 g/dL (ref 30.0–36.0)
MCV: 102.7 fL — ABNORMAL HIGH (ref 78.0–100.0)
Platelets: 107 10*3/uL — ABNORMAL LOW (ref 150–400)
RBC: 4.09 MIL/uL (ref 3.87–5.11)
RDW: 11.7 % (ref 11.5–15.5)
WBC: 6.5 10*3/uL (ref 4.0–10.5)

## 2018-01-03 MED ORDER — PHYTONADIONE 5 MG PO TABS
5.0000 mg | ORAL_TABLET | Freq: Once | ORAL | Status: AC
  Administered 2018-01-03: 5 mg via ORAL
  Filled 2018-01-03: qty 1

## 2018-01-03 MED ORDER — CHLORHEXIDINE GLUCONATE 4 % EX LIQD: 60.0000 mL | Freq: Once | CUTANEOUS | Status: DC

## 2018-01-03 MED ORDER — POVIDONE-IODINE 10 % EX SWAB: 2.0000 "application " | Freq: Once | CUTANEOUS | Status: DC

## 2018-01-03 NOTE — Plan of Care (Signed)
°  Problem: Education: °Goal: Knowledge of General Education information will improve °Description: Including pain rating scale, medication(s)/side effects and non-pharmacologic comfort measures °Outcome: Progressing °  °Problem: Health Behavior/Discharge Planning: °Goal: Ability to manage health-related needs will improve °Outcome: Progressing °  °Problem: Clinical Measurements: °Goal: Ability to maintain clinical measurements within normal limits will improve °Outcome: Progressing °Goal: Respiratory complications will improve °Outcome: Progressing °  °Problem: Activity: °Goal: Risk for activity intolerance will decrease °Outcome: Progressing °  °Problem: Pain Managment: °Goal: General experience of comfort will improve °Outcome: Progressing °  °

## 2018-01-03 NOTE — Progress Notes (Addendum)
PROGRESS NOTE  Casey [name] JHE:[id number] DOB: 31-Oct-1934 DOA: 01/01/2018 PCP: Elby Showers, MD  HPI/Recap of past 24 hours: Casey Paez McCormickis a 83 y.o.femalewithhistory of chronic systolic heart failure status post defibrillator placement and pacemaker, atrial fibrillation, chronic kidney disease stage IV, hypertension, gout who has had recent acute gouty attacks of left ankle and is on colchicine and has been having increasing bowel movements and had gone to the bathroom and on the way to the room patient felt dizzy and fell. Patient states she got entangled on a cloth on the way back. Denies losing consciousness hormone having any chest pain or palpitation. She hurt her left ankle and was brought to the ER.  01/03/2018: Patient seen and examined her bedside.  She has no new complaints, pain is well controlled.  Left ankle surgery planned today.     Assessment/Plan: Principal Problem:   Closed left ankle fracture Active Problems:   Biventricular implantable cardioverter-defibrillator in situ   Nonischemic cardiomyopathy (HCC)   Paroxysmal atrial fibrillation (HCC)   Gout   Hypothyroidism   Kidney disease, chronic, stage III (GFR 30-59 ml/min) (HCC)   Ventricular septal defect (VSD)   Ankle fracture  Closed left ankle fracture status post mechanical fall Left ankle repair plan today 01/03/2018 Orthopedic surgery following Continue pain management Continue bowel regimen  Worsening AKI on CKD 3 Avoid nephrotoxic agents Repeat BMP am  Paroxysmal A. fib Rate controlled Hold Coumadin due to planned surgery  Hypertension Blood pressure stable Continue home medications  Chronic systolic heart failure and VSD and MR Continue home medications  Gout Appears stable at this time  Hypothyroidism Continue Synthroid   Code Status: Full code  Family Communication: None at bedside  Disposition Plan: Home in 1 to 2 days when orthopedic surgery signs  of   Consultants:  Orthopedic surgery  Procedures:  Left ankle repair planned on 01/03/2018  Antimicrobials:  None  DVT prophylaxis: Coumadin on hold due to planned procedure   Objective: Vitals:   01/02/18 2055 01/03/18 0422 01/03/18 0430 01/03/18 1302  BP: (!) 112/57 (!) 78/61 93/60 (!) 89/56  Pulse: 75 75  74  Resp:  16  16  Temp:  98.4 F (36.9 C)  97.8 F (36.6 C)  TempSrc:  Oral  Oral  SpO2:  97%  100%  Weight:      Height:        Intake/Output Summary (Last 24 hours) at 01/03/2018 1821 Last data filed at 01/03/2018 1500 Gross per 24 hour  Intake 120 ml  Output 1200 ml  Net -1080 ml   Filed Weights   01/01/18 1944  Weight: 72.6 kg (160 lb)    Exam:  . General: 83 y.o. year-old female well developed well nourished in no acute distress.  Alert and oriented x3. . Cardiovascular: Regular rate and rhythm with no rubs or gallops.  No thyromegaly or JVD noted.   Marland Kitchen Respiratory: Clear to auscultation with no wheezes or rales. Good inspiratory effort. . Abdomen: Soft nontender nondistended with normal bowel sounds x4 quadrants. . Musculoskeletal: Left ankle is wrapped in a cast . Psychiatry: Mood is appropriate for condition and setting   Data Reviewed: CBC: Recent Labs  Lab 01/01/18 2004 01/02/18 0323 01/03/18 0415  WBC 5.7 7.4 6.5  NEUTROABS 3.7  --   --   HGB 14.1 14.4 13.7  HCT 44.6 44.7 42.0  MCV 107.0* 104.2* 102.7*  PLT 118* 118* 185*   Basic Metabolic Panel: Recent Labs  Lab 01/01/18 2004 01/02/18 0323 01/03/18 0415  NA 144 143 146*  K 4.2 4.3 3.9  CL 113* 112* 113*  CO2 21* 21* 24  GLUCOSE 126* 127* 120*  BUN 54* 51* 47*  CREATININE 2.93* 2.58* 2.75*  CALCIUM 9.5 9.4 9.8   GFR: Estimated Creatinine Clearance: 15 mL/min (A) (by C-G formula based on SCr of 2.75 mg/dL (H)). Liver Function Tests: No results for input(s): AST, ALT, ALKPHOS, BILITOT, PROT, ALBUMIN in the last 168 hours. No results for input(s): LIPASE, AMYLASE in  the last 168 hours. No results for input(s): AMMONIA in the last 168 hours. Coagulation Profile: Recent Labs  Lab 01/01/18 2004 01/03/18 0415  INR 2.22 2.42   Cardiac Enzymes: No results for input(s): CKTOTAL, CKMB, CKMBINDEX, TROPONINI in the last 168 hours. BNP (last 3 results) No results for input(s): PROBNP in the last 8760 hours. HbA1C: No results for input(s): HGBA1C in the last 72 hours. CBG: No results for input(s): GLUCAP in the last 168 hours. Lipid Profile: No results for input(s): CHOL, HDL, LDLCALC, TRIG, CHOLHDL, LDLDIRECT in the last 72 hours. Thyroid Function Tests: No results for input(s): TSH, T4TOTAL, FREET4, T3FREE, THYROIDAB in the last 72 hours. Anemia Panel: No results for input(s): VITAMINB12, FOLATE, FERRITIN, TIBC, IRON, RETICCTPCT in the last 72 hours. Urine analysis:    Component Value Date/Time   COLORURINE YELLOW 12/17/2007 1501   APPEARANCEUR CLEAR 12/17/2007 1501   LABSPEC 1.014 12/17/2007 1501   PHURINE 5.5 12/17/2007 1501   GLUCOSEU NEGATIVE 12/17/2007 1501   HGBUR NEGATIVE 12/17/2007 1501   BILIRUBINUR neg 06/10/2015 1019   KETONESUR NEGATIVE 12/17/2007 1501   PROTEINUR neg 06/10/2015 1019   PROTEINUR NEGATIVE 12/17/2007 1501   UROBILINOGEN 0.2 06/10/2015 1019   UROBILINOGEN 1.0 12/17/2007 1501   NITRITE neg 06/10/2015 1019   NITRITE NEGATIVE 12/17/2007 1501   LEUKOCYTESUR Negative 06/10/2015 1019   Sepsis Labs: @LABRCNTIP (procalcitonin:4,lacticidven:4)  ) Recent Results (from the past 240 hour(s))  Surgical pcr screen     Status: Abnormal   Collection Time: 01/02/18  9:20 PM  Result Value Ref Range Status   MRSA, PCR NEGATIVE NEGATIVE Final   Staphylococcus aureus POSITIVE (A) NEGATIVE Final    Comment: (NOTE) The Xpert SA Assay (FDA approved for NASAL specimens in patients 107 years of age and older), is one component of a comprehensive surveillance program. It is not intended to diagnose infection nor to guide or monitor  treatment.       Studies: No results found.  Scheduled Meds: . amiodarone  200 mg Oral Daily  . chlorhexidine  60 mL Topical Once  . dorzolamide  1 drop Both Eyes TID  . etomidate  12 mg Intravenous Once  . hydrALAZINE  37.5 mg Oral Q8H  . isosorbide mononitrate  30 mg Oral Daily  . latanoprost  1 drop Both Eyes QHS  . levothyroxine  75 mcg Oral QAC breakfast  . metoprolol succinate  50 mg Oral Daily  . multivitamin with minerals  1 tablet Oral Daily  . potassium chloride SA  20 mEq Oral Daily  . povidone-iodine  2 application Topical Once  . torsemide  60 mg Oral Daily    Continuous Infusions:   LOS: 1 day     Kayleen Memos, MD [facility] Pager [phone]  If 7PM-7AM, please contact night-coverage [email or web address] Password [facility] 01/03/2018, 6:21 PM

## 2018-01-03 NOTE — Progress Notes (Signed)
inr going up May need to delay surgery

## 2018-01-03 NOTE — Progress Notes (Addendum)
Pt seen and examined  Agree with m jefferies note inr 2.4 today Plan oral vit k today and or in am cx npo Skin ok Foot perfused but pulses diminished left

## 2018-01-04 ENCOUNTER — Inpatient Hospital Stay (HOSPITAL_COMMUNITY): Payer: Medicare Other | Admitting: Certified Registered Nurse Anesthetist

## 2018-01-04 ENCOUNTER — Encounter (HOSPITAL_COMMUNITY): Payer: Self-pay | Admitting: Orthopedic Surgery

## 2018-01-04 ENCOUNTER — Inpatient Hospital Stay (HOSPITAL_COMMUNITY): Payer: Medicare Other

## 2018-01-04 ENCOUNTER — Encounter (HOSPITAL_COMMUNITY)
Admission: EM | Disposition: A | Discharge status: Skilled Nursing Facility | Payer: Self-pay | Source: Home / Self Care | Attending: Internal Medicine

## 2018-01-04 ENCOUNTER — Encounter: Payer: Self-pay | Admitting: Internal Medicine

## 2018-01-04 DIAGNOSIS — I5022 Chronic systolic (congestive) heart failure: Secondary | ICD-10-CM

## 2018-01-04 DIAGNOSIS — S82842A Displaced bimalleolar fracture of left lower leg, initial encounter for closed fracture: Secondary | ICD-10-CM

## 2018-01-04 DIAGNOSIS — S82892A Other fracture of left lower leg, initial encounter for closed fracture: Secondary | ICD-10-CM

## 2018-01-04 DIAGNOSIS — N183 Chronic kidney disease, stage 3 (moderate): Secondary | ICD-10-CM

## 2018-01-04 DIAGNOSIS — Z01810 Encounter for preprocedural cardiovascular examination: Secondary | ICD-10-CM

## 2018-01-04 DIAGNOSIS — E039 Hypothyroidism, unspecified: Secondary | ICD-10-CM

## 2018-01-04 DIAGNOSIS — I428 Other cardiomyopathies: Secondary | ICD-10-CM

## 2018-01-04 DIAGNOSIS — I480 Paroxysmal atrial fibrillation: Secondary | ICD-10-CM

## 2018-01-04 HISTORY — PX: ORIF ANKLE FRACTURE: SHX5408

## 2018-01-04 LAB — PROTIME-INR
INR: 1.54
PROTHROMBIN TIME: 18.3 s — AB (ref 11.4–15.2)

## 2018-01-04 SURGERY — OPEN REDUCTION INTERNAL FIXATION (ORIF) ANKLE FRACTURE
Anesthesia: Monitor Anesthesia Care | Site: Ankle | Laterality: Left

## 2018-01-04 MED ORDER — ONDANSETRON HCL 4 MG PO TABS: 4.0000 mg | ORAL_TABLET | Freq: Four times a day (QID) | ORAL | Status: DC | PRN

## 2018-01-04 MED ORDER — MIDAZOLAM HCL 2 MG/2ML IJ SOLN
INTRAMUSCULAR | Status: AC
  Filled 2018-01-04: qty 2

## 2018-01-04 MED ORDER — SODIUM CHLORIDE 0.9 % IV SOLN
INTRAVENOUS | Status: DC
  Administered 2018-01-04: 18:00:00 via INTRAVENOUS

## 2018-01-04 MED ORDER — ROPIVACAINE HCL 7.5 MG/ML IJ SOLN
INTRAMUSCULAR | Status: DC | PRN
  Administered 2018-01-04: 20 mL via PERINEURAL

## 2018-01-04 MED ORDER — LACTATED RINGERS IV SOLN: INTRAVENOUS | Status: DC

## 2018-01-04 MED ORDER — CEFAZOLIN SODIUM-DEXTROSE 2-4 GM/100ML-% IV SOLN
2.0000 g | INTRAVENOUS | Status: AC
  Administered 2018-01-04: 2 g via INTRAVENOUS

## 2018-01-04 MED ORDER — EPHEDRINE SULFATE 50 MG/ML IJ SOLN
INTRAMUSCULAR | Status: DC | PRN
  Administered 2018-01-04 (×3): 5 mg via INTRAVENOUS

## 2018-01-04 MED ORDER — METOCLOPRAMIDE HCL 5 MG/ML IJ SOLN: 5.0000 mg | Freq: Three times a day (TID) | INTRAMUSCULAR | Status: DC | PRN

## 2018-01-04 MED ORDER — DEXMEDETOMIDINE HCL IN NACL 200 MCG/50ML IV SOLN
INTRAVENOUS | Status: DC | PRN
  Administered 2018-01-04: .5 ug/kg/h via INTRAVENOUS

## 2018-01-04 MED ORDER — FENTANYL CITRATE (PF) 100 MCG/2ML IJ SOLN
INTRAMUSCULAR | Status: AC
  Filled 2018-01-04: qty 2

## 2018-01-04 MED ORDER — HYDROCODONE-ACETAMINOPHEN 5-325 MG PO TABS
1.0000 | ORAL_TABLET | ORAL | Status: DC | PRN
  Administered 2018-01-05 – 2018-01-07 (×7): 1 via ORAL
  Filled 2018-01-04 (×8): qty 1

## 2018-01-04 MED ORDER — ONDANSETRON HCL 4 MG/2ML IJ SOLN
INTRAMUSCULAR | Status: DC | PRN
  Administered 2018-01-04: 4 mg via INTRAVENOUS

## 2018-01-04 MED ORDER — DOCUSATE SODIUM 100 MG PO CAPS
100.0000 mg | ORAL_CAPSULE | Freq: Two times a day (BID) | ORAL | Status: DC
  Administered 2018-01-05 – 2018-01-07 (×7): 100 mg via ORAL
  Filled 2018-01-04 (×7): qty 1

## 2018-01-04 MED ORDER — SODIUM CHLORIDE 0.9 % IR SOLN
Status: DC | PRN
  Administered 2018-01-04 (×3): 1000 mL

## 2018-01-04 MED ORDER — DEXMEDETOMIDINE HCL IN NACL 200 MCG/50ML IV SOLN
INTRAVENOUS | Status: AC
  Filled 2018-01-04: qty 50

## 2018-01-04 MED ORDER — PROPOFOL 500 MG/50ML IV EMUL
INTRAVENOUS | Status: AC
  Filled 2018-01-04: qty 50

## 2018-01-04 MED ORDER — TRAMADOL HCL 50 MG PO TABS
50.0000 mg | ORAL_TABLET | Freq: Two times a day (BID) | ORAL | Status: DC
  Administered 2018-01-05 – 2018-01-09 (×8): 50 mg via ORAL
  Filled 2018-01-04 (×9): qty 1

## 2018-01-04 MED ORDER — ONDANSETRON HCL 4 MG/2ML IJ SOLN
INTRAMUSCULAR | Status: AC
  Filled 2018-01-04: qty 4

## 2018-01-04 MED ORDER — MEPIVACAINE HCL 1.5 % IJ SOLN
INTRAMUSCULAR | Status: DC | PRN
  Administered 2018-01-04: 20 mL via PERINEURAL

## 2018-01-04 MED ORDER — METOCLOPRAMIDE HCL 5 MG PO TABS: 5.0000 mg | ORAL_TABLET | Freq: Three times a day (TID) | ORAL | Status: DC | PRN

## 2018-01-04 MED ORDER — CEFAZOLIN SODIUM-DEXTROSE 2-4 GM/100ML-% IV SOLN
INTRAVENOUS | Status: AC
  Filled 2018-01-04: qty 100

## 2018-01-04 MED ORDER — MUPIROCIN 2 % EX OINT: 1.0000 "application " | TOPICAL_OINTMENT | Freq: Two times a day (BID) | CUTANEOUS | Status: DC

## 2018-01-04 MED ORDER — METHOCARBAMOL 1000 MG/10ML IJ SOLN
500.0000 mg | Freq: Four times a day (QID) | INTRAVENOUS | Status: DC | PRN
  Filled 2018-01-04: qty 5

## 2018-01-04 MED ORDER — VASOPRESSIN 20 UNIT/ML IV SOLN
INTRAVENOUS | Status: AC
  Filled 2018-01-04: qty 1

## 2018-01-04 MED ORDER — MUPIROCIN 2 % EX OINT
TOPICAL_OINTMENT | CUTANEOUS | Status: AC
  Filled 2018-01-04: qty 22

## 2018-01-04 MED ORDER — CEFAZOLIN SODIUM-DEXTROSE 1-4 GM/50ML-% IV SOLN
1.0000 g | Freq: Two times a day (BID) | INTRAVENOUS | Status: AC
  Administered 2018-01-05: 1 g via INTRAVENOUS
  Filled 2018-01-04: qty 50

## 2018-01-04 MED ORDER — ACETAMINOPHEN 325 MG PO TABS
325.0000 mg | ORAL_TABLET | Freq: Four times a day (QID) | ORAL | Status: DC | PRN
  Administered 2018-01-05 – 2018-01-08 (×7): 650 mg via ORAL
  Filled 2018-01-04 (×3): qty 2
  Filled 2018-01-04: qty 1
  Filled 2018-01-04 (×3): qty 2

## 2018-01-04 MED ORDER — EPINEPHRINE PF 1 MG/ML IJ SOLN
INTRAMUSCULAR | Status: AC
  Filled 2018-01-04: qty 1

## 2018-01-04 MED ORDER — ONDANSETRON HCL 4 MG/2ML IJ SOLN: 4.0000 mg | Freq: Four times a day (QID) | INTRAMUSCULAR | Status: DC | PRN

## 2018-01-04 MED ORDER — METHOCARBAMOL 500 MG PO TABS
500.0000 mg | ORAL_TABLET | Freq: Four times a day (QID) | ORAL | Status: DC | PRN
  Administered 2018-01-05 – 2018-01-06 (×4): 500 mg via ORAL
  Filled 2018-01-04 (×4): qty 1

## 2018-01-04 MED ORDER — FENTANYL CITRATE (PF) 100 MCG/2ML IJ SOLN: 25.0000 ug | INTRAMUSCULAR | Status: DC | PRN

## 2018-01-04 SURGICAL SUPPLY — 89 items
BANDAGE ACE 4X5 VEL STRL LF (GAUZE/BANDAGES/DRESSINGS) ×3 IMPLANT
BANDAGE ACE 6X5 VEL STRL LF (GAUZE/BANDAGES/DRESSINGS) ×2 IMPLANT
BIT DRILL 110X2.5XQCK CNCT (BIT) IMPLANT
BIT DRILL 2.5 (BIT) ×3
BIT DRILL 2.7XCANN QCK CNCT (BIT) IMPLANT
BIT DRILL CANN 2.7 (BIT) ×2
BIT DRILL CANN 2.7MM (BIT) ×1
BIT DRILL QC 110 3.5 (BIT) ×1
BIT DRILL QC 110 3.5MM (BIT) IMPLANT
BIT DRILL STD 2.0MM (DRILL) IMPLANT
BIT DRL 110X2.5XQCK CNCT (BIT) ×1
BIT DRL 2.7XCANN QCK CNCT (BIT) ×1
BLADE SURG 10 STRL SS (BLADE) ×3 IMPLANT
BNDG CMPR 9X4 STRL LF SNTH (GAUZE/BANDAGES/DRESSINGS) ×1
BNDG CMPR MED 10X6 ELC LF (GAUZE/BANDAGES/DRESSINGS) ×1
BNDG COHESIVE 6X5 TAN STRL LF (GAUZE/BANDAGES/DRESSINGS) IMPLANT
BNDG ELASTIC 6X10 VLCR STRL LF (GAUZE/BANDAGES/DRESSINGS) ×3 IMPLANT
BNDG ESMARK 4X9 LF (GAUZE/BANDAGES/DRESSINGS) ×3 IMPLANT
BNDG GAUZE ELAST 4 BULKY (GAUZE/BANDAGES/DRESSINGS) ×3 IMPLANT
COVER MAYO STAND STRL (DRAPES) IMPLANT
COVER SURGICAL LIGHT HANDLE (MISCELLANEOUS) ×3 IMPLANT
CUFF TOURNIQUET SINGLE 34IN LL (TOURNIQUET CUFF) ×2 IMPLANT
CUFF TOURNIQUET SINGLE 44IN (TOURNIQUET CUFF) IMPLANT
DRAPE C-ARM 42X72 X-RAY (DRAPES) ×1 IMPLANT
DRAPE INCISE IOBAN 66X45 STRL (DRAPES) IMPLANT
DRAPE SURG 17X23 STRL (DRAPES) ×3 IMPLANT
DRAPE U-SHAPE 47X51 STRL (DRAPES) ×3 IMPLANT
DRILL BIT QC 110 3.5MM (BIT) ×3
DRILL STANDARD 2.0MM (DRILL) ×3
DRSG ADAPTIC 3X8 NADH LF (GAUZE/BANDAGES/DRESSINGS) ×2 IMPLANT
DRSG PAD ABDOMINAL 8X10 ST (GAUZE/BANDAGES/DRESSINGS) ×3 IMPLANT
DURAPREP 26ML APPLICATOR (WOUND CARE) ×4 IMPLANT
ELECT CAUTERY BLADE 6.4 (BLADE) ×3 IMPLANT
ELECT REM PT RETURN 9FT ADLT (ELECTROSURGICAL) ×3
ELECTRODE REM PT RTRN 9FT ADLT (ELECTROSURGICAL) ×1 IMPLANT
GAUZE SPONGE 4X4 12PLY STRL (GAUZE/BANDAGES/DRESSINGS) ×3 IMPLANT
GAUZE XEROFORM 5X9 LF (GAUZE/BANDAGES/DRESSINGS) ×3 IMPLANT
GLOVE BIOGEL PI IND STRL 7.5 (GLOVE) ×1 IMPLANT
GLOVE BIOGEL PI IND STRL 8 (GLOVE) ×1 IMPLANT
GLOVE BIOGEL PI INDICATOR 7.5 (GLOVE) ×2
GLOVE BIOGEL PI INDICATOR 8 (GLOVE) ×2
GLOVE ECLIPSE 7.0 STRL STRAW (GLOVE) ×3 IMPLANT
GLOVE SURG ORTHO 8.0 STRL STRW (GLOVE) ×3 IMPLANT
GOWN STRL REUS W/ TWL LRG LVL3 (GOWN DISPOSABLE) ×3 IMPLANT
GOWN STRL REUS W/TWL LRG LVL3 (GOWN DISPOSABLE) ×9
GUIDEWIRE PIN ORTH 6X1.6XSMTH (WIRE) IMPLANT
HANDPIECE INTERPULSE COAX TIP (DISPOSABLE)
K-WIRE 1.6 (WIRE) ×3
KIT BASIN OR (CUSTOM PROCEDURE TRAY) ×3 IMPLANT
KIT TURNOVER KIT B (KITS) ×3 IMPLANT
MANIFOLD NEPTUNE II (INSTRUMENTS) ×3 IMPLANT
NDL HYPO 25GX1X1/2 BEV (NEEDLE) ×1 IMPLANT
NEEDLE HYPO 25GX1X1/2 BEV (NEEDLE) ×3 IMPLANT
NS IRRIG 1000ML POUR BTL (IV SOLUTION) ×3 IMPLANT
PACK ORTHO EXTREMITY (CUSTOM PROCEDURE TRAY) ×3 IMPLANT
PAD ARMBOARD 7.5X6 YLW CONV (MISCELLANEOUS) ×6 IMPLANT
PAD CAST 4YDX4 CTTN HI CHSV (CAST SUPPLIES) ×1 IMPLANT
PADDING CAST COTTON 4X4 STRL (CAST SUPPLIES) ×3
PLATE LOCK DIST FIB 2.7X80 LT (Plate) ×2 IMPLANT
SCREW 3.5X16MM (Screw) ×12 IMPLANT
SCREW BN 2.7X16X3.5XST NS (Screw) IMPLANT
SCREW CANN PT SD 3.5X40 (Screw) ×2 IMPLANT
SCREW CORTICAL 3.5X18 (Screw) ×2 IMPLANT
SCREW LOCK 10X2.7XNS ELB (Screw) IMPLANT
SCREW LOCK 16X2.7X (Screw) IMPLANT
SCREW LOCK 18X2.7XNS ELB (Screw) ×1 IMPLANT
SCREW LOCKING 2.7MMX18MM (Screw) ×3 IMPLANT
SCREW LOCKING 2.7X10MM (Screw) ×3 IMPLANT
SCREW LOCKING 2.7X16 (Screw) ×3 IMPLANT
SCREW PA VIRAGE 4.5X30 (Screw) ×2 IMPLANT
SCREW PERI 3.5X14MM W/2.7 (Screw) ×2 IMPLANT
SET HNDPC FAN SPRY TIP SCT (DISPOSABLE) IMPLANT
SPLINT PLASTER CAST XFAST 5X30 (CAST SUPPLIES) IMPLANT
SPLINT PLASTER XFAST SET 5X30 (CAST SUPPLIES) ×2
STOCKINETTE IMPERVIOUS 9X36 MD (GAUZE/BANDAGES/DRESSINGS) IMPLANT
SUCTION FRAZIER HANDLE 10FR (MISCELLANEOUS) ×2
SUCTION TUBE FRAZIER 10FR DISP (MISCELLANEOUS) ×1 IMPLANT
SUT ETHILON 3 0 PS 1 (SUTURE) ×12 IMPLANT
SUT MNCRL AB 3-0 PS2 18 (SUTURE) IMPLANT
SUT VIC AB 2-0 CTB1 (SUTURE) ×3 IMPLANT
SUT VIC AB 3-0 SH 27 (SUTURE) ×3
SUT VIC AB 3-0 SH 27X BRD (SUTURE) ×1 IMPLANT
SYR CONTROL 10ML LL (SYRINGE) ×3 IMPLANT
TOWEL OR 17X24 6PK STRL BLUE (TOWEL DISPOSABLE) ×3 IMPLANT
TOWEL OR 17X26 10 PK STRL BLUE (TOWEL DISPOSABLE) ×3 IMPLANT
TUBE CONNECTING 12'X1/4 (SUCTIONS) ×1
TUBE CONNECTING 12X1/4 (SUCTIONS) ×2 IMPLANT
WATER STERILE IRR 1000ML POUR (IV SOLUTION) ×3 IMPLANT
YANKAUER SUCT BULB TIP NO VENT (SUCTIONS) ×3 IMPLANT

## 2018-01-04 NOTE — Anesthesia Procedure Notes (Signed)
Anesthesia Regional Block: Popliteal block   Pre-Anesthetic Checklist: ,, timeout performed, Correct Patient, Correct Site, Correct Laterality, Correct Procedure, Correct Position, site marked, Risks and benefits discussed,  Surgical consent,  Pre-op evaluation,  At surgeon's request and post-op pain management  Laterality: Left  Prep: chloraprep       Needles:  Injection technique: Single-shot  Needle Type: Echogenic Needle     Needle Length: 9cm  Needle Gauge: 21     Additional Needles:   Procedures:,,,, ultrasound used (permanent image in chart),,,,  Narrative:  Start time: 01/04/2018 6:10 PM End time: 01/04/2018 6:16 PM Injection made incrementally with aspirations every 5 mL.  Performed by: Personally  Anesthesiologist: Catalina Gravel, MD  Additional Notes: No pain on injection. No increased resistance to injection. Injection made in 5cc increments.  Good needle visualization.  Patient tolerated procedure well.  COMBINED LEFT POPLITEAL/SAPHENOUS NERVE BLOCK.

## 2018-01-04 NOTE — Brief Op Note (Signed)
01/04/2018  9:09 PM  PATIENT:  Florian Buff  83 y.o. female  PRE-OPERATIVE DIAGNOSIS:  Left ankle fracture  POST-OPERATIVE DIAGNOSIS:  LEFT ANKLE FRACTURE  PROCEDURE:  Procedure(s): OPEN REDUCTION INTERNAL FIXATION (ORIF) ANKLE FRACTURE  SURGEON:  Surgeon(s): Meredith Pel, MD  ASSISTANT: None  ANESTHESIA:   regional  EBL: 30 ml    Total I/O In: 300 [I.V.:300] Out: 30 [Blood:30]  BLOOD ADMINISTERED: none  DRAINS: none   LOCAL MEDICATIONS USED:  none  SPECIMEN:  No Specimen  COUNTS:  YES  TOURNIQUET:   Total Tourniquet Time Documented: Calf (Left) - 43 minutes Total: Calf (Left) - 43 minutes   DICTATION: .Other Dictation: Dictation [id number] PLAN OF CARE: Admit to inpatient   PATIENT DISPOSITION:  PACU - hemodynamically stable

## 2018-01-04 NOTE — Plan of Care (Signed)
  Problem: Education: Goal: Knowledge of General Education information will improve Description Including pain rating scale, medication(s)/side effects and non-pharmacologic comfort measures Outcome: Progressing   Problem: Clinical Measurements: Goal: Will remain free from infection Outcome: Progressing   Problem: Nutrition: Goal: Adequate nutrition will be maintained Outcome: Progressing   Problem: Coping: Goal: Level of anxiety will decrease Outcome: Progressing   

## 2018-01-04 NOTE — Progress Notes (Signed)
SBP in 80"s  MAP 62, alert oriented. Dr. Smith Robert aware . No orders.

## 2018-01-04 NOTE — Progress Notes (Signed)
INR 1.5.  Planned surgery tonight.  Risk and benefits discussed.  All questions answered.

## 2018-01-04 NOTE — Consult Note (Addendum)
Cardiology Consultation:   Patient ID: Casey [name]; [id number]; 12/02/1934   Admit date: 01/01/2018 Date of Consult: 01/04/2018  Primary Care Provider: Elby Showers, MD Primary Cardiologist: Dr Wynonia Lawman CHF MD: Dr Aundra Dubin, 12/21/2017 Primary Electrophysiologist:  Dr Caryl Comes, 2016   Patient Profile:   Casey [name] is a 83 y.o. female with a hx of NICM, CKD III-IV, nl cors 10/15/2017 cath, MDT CRT-D, no ACEI/ARB/ARNI due to CKD (tried all in the past), solitary kidney, PAF on coumadin, EF 15-20% 10/31/2017 w/ mod central MR by TEE (not a candidate for mitraclip),small perimembranous VSD, who is being seen today for the preop evaluation of ankle surgery at the request of Dr Marlou Sa.  History of Present Illness:   Casey [name] was seen by Dr Aundra Dubin 07/12 and was stable from a CHF standpoint. Her weight was 159 lbs, BiV pacing, Corvue ok.   After her appt w/ Dr Aundra Dubin, she was doing well. Weight was stable. Compliant w/ meds. Checks BP at home, SBP 90s-110s. Feels breathing was good. She is careful about moving around, has fallen in the past. Says the previous falls were related to fluid around her heart. Was not having orthostatic sx.  She was having problems w/ gout>>colchicine>>bowel urgency and increased frequency.   She got up to go to the bathroom. Afterwards, she was light-headed and was trying to get back to bed. She fell, but says her feet got tangled up. No loss of consciousness.   Ankle pain>>fx/dislocation>>decreased pulses as well. L ankle, reduced in the ER. Still w/ decreased pulses, plan ORIF.  Admitted 07/23, INR too high for surgery yesterday. Got vit K 5 mg po x 1, INR now low enough for surgery.   Since admission, she is breathing fine. Her ankle pain is well-controlled. No palpitations. No LE edema, no orthopnea or PND.   Torsemide held today. Hydralazine held since admission. SBP has been 80s-110s since admission. This am, lowest BP so far, at 80/51.   Lying in bed, she is asymptomatic. PO intake is reduced in the hospital, but feel I/O inaccurate. Not getting daily wts.   Past Medical History:  Diagnosis Date  . Atherosclerosis of aorta ([place])   . Biventricular ICD (implantable cardiac defibrillator) in place    medtronic   . Chronic kidney disease (CKD), stage III (moderate) (HCC)   . Chronic systolic heart failure ([place])   . Gout   . History of peptic ulcer disease   . Hypertension   . Hypothyroidism   . Lumbar disc disease   . Nonischemic cardiomyopathy ([place])   . Ventricular septal defect   . VT (ventricular tachycardia) ([place])    appropriate therapy via ICD 2013    Past Surgical History:  Procedure Laterality Date  . ABDOMINAL HYSTERECTOMY    . BIV ICD GENERTAOR CHANGE OUT N/A 04/16/2013   Procedure: BIV ICD GENERTAOR CHANGE OUT;  Surgeon: Deboraha Sprang, MD;  Location: [facility] CATH LAB;  Service: Cardiovascular;  Laterality: N/A;  . CESAREAN SECTION    . FEMUR IM NAIL    . TEE WITHOUT CARDIOVERSION N/A 10/31/2017   Procedure: TRANSESOPHAGEAL ECHOCARDIOGRAM (TEE);  Surgeon: Larey Dresser, MD;  Location: [facility] ENDOSCOPY;  Service: Cardiovascular;  Laterality: N/A;     Prior to Admission medications   Medication Sig Start Date End Date Taking? Authorizing Provider  amiodarone (PACERONE) 200 MG tablet Take 200 mg by mouth daily.   Yes [provider]  colchicine 0.6 MG tablet Take 1 tablet (0.6 mg  total) by mouth daily as needed. 12/21/17  Yes Larey Dresser, MD  dorzolamide (TRUSOPT) 2 % ophthalmic solution Place 1 drop into both eyes 3 (three) times daily.   Yes [provider]  hydrALAZINE (APRESOLINE) 25 MG tablet Take 1.5 tablets (37.5 mg total) by mouth 3 (three) times daily. 10/23/17  Yes Larey Dresser, MD  isosorbide mononitrate (IMDUR) 30 MG 24 hr tablet Take 1 tablet (30 mg total) by mouth daily. 07/12/17  Yes Georgiana Shore, NP  Latanoprost 0.005 % EMUL Place 1 drop into both eyes at bedtime.    Yes  [provider]  levothyroxine (SYNTHROID, LEVOTHROID) 75 MCG tablet Take 75 mcg by mouth daily before breakfast.    Yes [provider]  metoprolol succinate (TOPROL-XL) 50 MG 24 hr tablet Take 1 tablet (50 mg total) by mouth daily. 12/21/17  Yes Larey Dresser, MD  Multiple Vitamins-Calcium (ONE-A-DAY WOMENS FORMULA) TABS Take 1 tablet by mouth daily.     Yes [provider]  potassium chloride SA (K-DUR,KLOR-CON) 20 MEQ tablet Take 20 mEq by mouth daily.    Yes [provider]  torsemide (DEMADEX) 20 MG tablet Take 40 mg (2 tabs) daily, alternating with 20 mg (1 tab) every other day Patient taking differently: Take 60 mg by mouth daily. . 12/26/17  Yes Larey Dresser, MD  warfarin (COUMADIN) 2 MG tablet Take 1 tablet (2 mg total) by mouth daily. 07/12/17  Yes Georgiana Shore, NP  allopurinol (ZYLOPRIM) 100 MG tablet TAKE 1 TABLET BY MOUTH EVERY DAY Patient not taking: Reported on 01/01/2018 05/02/16   Elby Showers, MD    Inpatient Medications: Scheduled Meds: . amiodarone  200 mg Oral Daily  . chlorhexidine  60 mL Topical Once  . dorzolamide  1 drop Both Eyes TID  . etomidate  12 mg Intravenous Once  . hydrALAZINE  37.5 mg Oral Q8H  . isosorbide mononitrate  30 mg Oral Daily  . latanoprost  1 drop Both Eyes QHS  . levothyroxine  75 mcg Oral QAC breakfast  . metoprolol succinate  50 mg Oral Daily  . multivitamin with minerals  1 tablet Oral Daily  . potassium chloride SA  20 mEq Oral Daily  . povidone-iodine  2 application Topical Once  . torsemide  60 mg Oral Daily   Continuous Infusions:  PRN Meds: acetaminophen **OR** acetaminophen, fentaNYL (SUBLIMAZE) injection, ondansetron **OR** ondansetron (ZOFRAN) IV  Allergies:    Allergies  Allergen Reactions  . Codeine Rash    Social History:   Social History   Socioeconomic History  . Marital status: Married    Spouse name: Not on file  . Number of children: Not on file  . Years of  education: Not on file  . Highest education level: Not on file  Occupational History  . Not on file  Social Needs  . Financial resource strain: Not on file  . Food insecurity:    Worry: Not on file    Inability: Not on file  . Transportation needs:    Medical: Not on file    Non-medical: Not on file  Tobacco Use  . Smoking status: Never Smoker  . Smokeless tobacco: Never Used  Substance and Sexual Activity  . Alcohol use: No  . Drug use: No  . Sexual activity: Not on file  Lifestyle  . Physical activity:    Days per week: Not on file    Minutes per session: Not on file  .  Stress: Not on file  Relationships  . Social connections:    Talks on phone: Not on file    Gets together: Not on file    Attends religious service: Not on file    Active member of club or organization: Not on file    Attends meetings of clubs or organizations: Not on file    Relationship status: Not on file  . Intimate partner violence:    Fear of current or ex partner: Not on file    Emotionally abused: Not on file    Physically abused: Not on file    Forced sexual activity: Not on file  Other Topics Concern  . Not on file  Social History Narrative  . Not on file    Family History:   Family History  Problem Relation Age of Onset  . Heart failure Mother   . Cancer Father    Family Status:  Family Status  Relation Name Status  . Mother  Deceased at age 58       CHF  . Father  Deceased       unknown type of cancer  . MGM  Deceased  . MGF  Deceased  . PGM  Deceased  . PGF  Deceased    ROS:  Please see the history of present illness.  All other ROS reviewed and negative.     Physical Exam/Data:   Vitals:   01/03/18 1954 01/03/18 2300 01/04/18 0413 01/04/18 0800  BP: (!) 113/59 (!) 92/54 91/63 (!) 80/51  Pulse: 74  71 69  Resp: 20  16 14   Temp: 99 F (37.2 C)  (!) 97.5 F (36.4 C) 98.1 F (36.7 C)  TempSrc: Oral  Oral Oral  SpO2: 99%  97% 100%  Weight:      Height:         Intake/Output Summary (Last 24 hours) at 01/04/2018 0920 Last data filed at 01/04/2018 0500 Gross per 24 hour  Intake 240 ml  Output 1100 ml  Net -860 ml   Filed Weights   01/01/18 1944  Weight: 160 lb (72.6 kg)   Body mass index is 29.26 kg/m.  General:  Well nourished, well developed, in no acute distress HEENT: normal Lymph: no adenopathy Neck: no JVD Endocrine:  No thryomegaly Vascular: Faint R carotid bruit; 3/4 extremity pulses 2+, LLE pulse not palpable, partly due to bandaging/splint. Capillary refill is delayed Cardiac:  normal S1, S2; RRR; no murmur  Lungs:  clear to auscultation bilaterally, no wheezing, rhonchi or rales  Abd: soft, nontender, no hepatomegaly  Ext: no edema R, L lower leg and foot not assessed Musculoskeletal:  No deformities visible, generally weak, strength equal Skin: warm and dry  Neuro:  CNs 2-12 intact, no focal abnormalities noted Psych:  Normal affect   EKG:  The EKG was personally reviewed and demonstrates:  07/26, SR w/ BiV pacing, QRS 182, QT/QTc 546/572; no sig change from 06/2017  Telemetry:  Telemetry was personally reviewed and demonstrates:  SR, V pacing  Relevant CV Studies:  ECHO: 10/31/2017 - Left ventricle: Mildly dilated left ventricle with normal wall   thickness. EF 15-20%, diffuse hypokinesis. I think that we   localized a VSD but were never able to measure gradient across   it. It is small, appears peri-membranous. - Aortic valve: There was no stenosis. - Aorta: Normal caliber aorta with grade III plaque descending   thoracic aorta. - Mitral valve: Difficult images, but think mitral regurgitation is  moderate and central (likely functional). No pulmonary vein   systolic flow reversal on doppler interrogation. - Left atrium: The atrium was mildly dilated. No evidence of   thrombus in the atrial cavity or appendage. - Right ventricle: The cavity size was mildly dilated. Pacer wire   or catheter noted in right  ventricle. Systolic function was   normal. - Right atrium: The atrium was mildly dilated. - Atrial septum: No ASD/PFO by color doppler. - Tricuspid valve: There was moderate regurgitation. Peak RV-RA gradient (S): 38 mm Hg. Impressions: - Images were very difficult due to heart position. Moderate   functional MR. Would not pursue Mitraclip.  CATH:   Laboratory Data:  Chemistry Recent Labs  Lab 01/01/18 2004 01/02/18 0323 01/03/18 0415  NA 144 143 146*  K 4.2 4.3 3.9  CL 113* 112* 113*  CO2 21* 21* 24  GLUCOSE 126* 127* 120*  BUN 54* 51* 47*  CREATININE 2.93* 2.58* 2.75*  CALCIUM 9.5 9.4 9.8  GFRNONAA 14* 16* 15*  GFRAA 16* 19* 18*  ANIONGAP 10 10 9     Lab Results  Component Value Date   ALT 16 12/21/2017   AST 24 12/21/2017   ALKPHOS 109 12/21/2017   BILITOT 1.6 (H) 12/21/2017   Hematology Recent Labs  Lab 01/01/18 2004 01/02/18 0323 01/03/18 0415  WBC 5.7 7.4 6.5  RBC 4.17 4.29 4.09  HGB 14.1 14.4 13.7  HCT 44.6 44.7 42.0  MCV 107.0* 104.2* 102.7*  MCH 33.8 33.6 33.5  MCHC 31.6 32.2 32.6  RDW 11.9 11.9 11.7  PLT 118* 118* 107*   TSH:  Lab Results  Component Value Date   TSH 0.706 12/21/2017    Radiology/Studies:  Dg Knee 1-2 Views Left  Result Date: 01/02/2018 CLINICAL DATA:  Closed reduction of LEFT ankle fractures yesterday with casting. Remote ORIF of a tibial fracture with an intramedullary nail. Patient states chronic RIGHT knee pain since placement of the intramedullary nail. EXAM: LEFT KNEE - 1-2 VIEW COMPARISON:  None. FINDINGS: No evidence of acute, subacute or healed fractures. Moderate MEDIAL compartment joint space narrowing. Mild LATERAL and patellofemoral compartment joint space narrowing. Mild osseous demineralization. No complicating features related to the intramedullary nail in the visualized proximal tibia. Calcified loose body in the joint space. Small joint effusion. Femoropopliteal and tibioperoneal artery atherosclerosis.  IMPRESSION: 1. No acute or subacute osseous abnormality. 2. Mild-to-moderate osteoarthritis, most prominent in the MEDIAL compartment. 3. Small joint effusion.  Calcified loose body in the joint. 4. No complicating features related to the intramedullary nail in the visualized proximal tibia. Electronically Signed   By: Evangeline Dakin M.D.   On: 01/02/2018 10:58   Dg Ankle Complete Left  Result Date: 01/01/2018 CLINICAL DATA:  Post reduction EXAM: LEFT ANKLE COMPLETE - 3+ VIEW COMPARISON:  01/01/2018 FINDINGS: Placement of cast material obscures bone detail. Partially visualized surgical rod within the distal tibia. Old fracture deformity of the distal shaft of the tibia. Acute medial malleolar fracture with mild displacement and slight asymmetric widening of the superior medial mortise. Acute distal fibular fracture with residual 1/4 bone with of lateral displacement of distal fracture fragment. Suspected posterior malleolar fracture with mild residual displacement. Residual widened appearance of the anterior joint space with anterior avulsion fracture off the distal tibia. IMPRESSION: 1. Interim casting of the left ankle 2. Acute mildly displaced medial and lateral malleolar fractures. Suspected posterior malleolar fracture with mild displacement 3. Residual widening of the anterior ankle joint with mild posterior subluxation of talar  dome. Probable avulsion fracture off the distal anterior tibia. Electronically Signed   By: Donavan Foil M.D.   On: 01/01/2018 23:58   Dg Ankle Complete Left  Result Date: 01/01/2018 CLINICAL DATA:  83 year old with left lower leg injury after fall. Dislocation. EXAM: LEFT ANKLE COMPLETE - 3+ VIEW COMPARISON:  None. FINDINGS: Intramedullary rod in the distal tibia related to old fracture. Displaced fractures involving the medial malleolus and lateral malleolus. Disruption of the ankle mortise. Soft tissue swelling associated with the fractures. There is at least posterior  subluxation and possibly dislocation of the ankle joint. Prominent plantar calcaneal spur. IMPRESSION: Left ankle fractures with disruption of the ankle mortise. Posterior subluxation or dislocation at the ankle joint. Electronically Signed   By: Markus Daft M.D.   On: 01/01/2018 21:31   Ct Head Wo Contrast  Result Date: 01/01/2018 CLINICAL DATA:  Fall after dizziness.  No loss of consciousness. EXAM: CT HEAD WITHOUT CONTRAST TECHNIQUE: Contiguous axial images were obtained from the base of the skull through the vertex without intravenous contrast. COMPARISON:  None. FINDINGS: Brain: Mild diffuse cortical atrophy is noted. Mild chronic ischemic white matter disease is noted. No mass effect or midline shift is noted. Ventricular size is within normal limits. There is no evidence of mass lesion, hemorrhage or acute infarction. Vascular: No hyperdense vessel or unexpected calcification. Skull: Normal. Negative for fracture or focal lesion. Sinuses/Orbits: No acute finding. Other: None. IMPRESSION: Mild diffuse cortical atrophy. Mild chronic ischemic white matter disease. No acute intracranial abnormality seen. Electronically Signed   By: Marijo Conception, M.D.   On: 01/01/2018 22:36    Assessment and Plan:   1. Preop cardiovascular exam: -- volume status good by exam. -- Dr Haroldine Laws advise on checking Corvue -- Advised her that her risk of surgery is increased due to her heart condition, but no cardiac sx, no further cardiac workup indicated at this time. -- She is at increased but acceptable risk for the surgery.   2. Chronic systolic CHF -- torsemide held today due to low BP, Kdur given -- suspect po intake is decreased as she was NPO yesterday am and again today -- w/ reduced EF, will need to be very careful w/ IVF in the perioperative period.  -- consider decreasing hydralazine to 10 mg tid and torsemide to 20 mg qd for now. -- make every effort to continue BB and nitrates in periop period, but  need to keep SBP >90 -- discuss if inotropes would help   Otherwise, per IM, Ortho Principal Problem:   Closed left ankle fracture Active Problems:   Biventricular implantable cardioverter-defibrillator in situ   Nonischemic cardiomyopathy (HCC)   Paroxysmal atrial fibrillation (HCC)   Gout   Hypothyroidism   Kidney disease, chronic, stage III (GFR 30-59 ml/min) (HCC)   Ventricular septal defect (VSD)   Ankle fracture     For questions or updates, please contact [facility] Please consult [email or web address] for contact info under Cardiology/STEMI.   Signed, Rosaria Ferries, PA-C  01/04/2018 9:20 AM   Patient seen and examined with the above-signed Advanced Practice Provider and/or Housestaff. I personally reviewed laboratory data, imaging studies and relevant notes. I independently examined the patient and formulated the important aspects of the plan. I have edited the note to reflect any of my changes or salient points. I have personally discussed the plan with the patient and/or family.  83 y/o woman with severe NICM with EF 15-20%. Despite low EF has been doing well. Remains  active going to store without problems.   Earlier this week. Was getting off commode after BM and felt weak. Says her feet got tangled and she fell to floor and broke her ankle.   We are asked to see for Pre-op CV risk stratification.   On exam looks well CVP 5-6 Cor RRR 2/6 MR Lungs clear Ab soft NT Ext no c/c/e left ankle cast  Overall she is at moderate to high risk for peri-op CV complications due to low EF. However with preserved functional capacity she can proceed to surgery with no further testing. BP currently low. Would hold hydralazine, imdur and torsemide for now. Can hydrate gently as needed. If get into problems with hypotension would favor norepinephrine peri-op. Will interrogate ICD to make sure no arrhythmic events related to fall.   We will follow at a distance. Please call with  questions.   Glori Bickers, MD  11:28 AM

## 2018-01-04 NOTE — Anesthesia Postprocedure Evaluation (Signed)
Anesthesia Post Note  Patient: Casey [name]  Procedure(s) Performed: OPEN REDUCTION INTERNAL FIXATION (ORIF) ANKLE FRACTURE (Left Ankle)     Patient location during evaluation: PACU Anesthesia Type: Regional Level of consciousness: awake and alert Pain management: pain level controlled Vital Signs Assessment: post-procedure vital signs reviewed and stable Respiratory status: spontaneous breathing, nonlabored ventilation, respiratory function stable and patient connected to nasal cannula oxygen Cardiovascular status: stable and blood pressure returned to baseline Postop Assessment: no apparent nausea or vomiting Anesthetic complications: no    Last Vitals:  Vitals:   01/04/18 2145 01/04/18 2210  BP: (!) 89/52 (!) 93/55  Pulse:  69  Resp: (!) 24 16  Temp: (!) 36.1 C 36.4 C  SpO2: 100% 100%    Last Pain:  Vitals:   01/04/18 2210  TempSrc: Oral  PainSc:                  [name]

## 2018-01-04 NOTE — Anesthesia Preprocedure Evaluation (Addendum)
Anesthesia Evaluation  Patient identified by MRN, date of birth, ID band Patient awake    Reviewed: Allergy & Precautions, NPO status , Patient's Chart, lab work & pertinent test results, reviewed documented beta blocker date and time   Airway Mallampati: II  TM Distance: >3 FB Neck ROM: Full    Dental  (+) Teeth Intact, Dental Advisory Given   Pulmonary neg pulmonary ROS,    Pulmonary exam normal breath sounds clear to auscultation       Cardiovascular hypertension, Pt. on medications and Pt. on home beta blockers + Peripheral Vascular Disease and +CHF  Normal cardiovascular exam+ pacemaker + Cardiac Defibrillator  Rhythm:Regular Rate:Normal  10/31/17 ECHO Left ventricle: Mildly dilated left ventricle with normal wall   thickness. EF 15-20%, diffuse hypokinesis. I think that we   localized a VSD but were never able to measure gradient across   it. It is small, appears peri-membranous.   Neuro/Psych PSYCHIATRIC DISORDERS Anxiety negative neurological ROS     GI/Hepatic GERD  ,  Endo/Other  Hypothyroidism   Renal/GU CRFRenal disease     Musculoskeletal Left ankle fracture   Abdominal   Peds  Hematology  (+) Blood dyscrasia (Thrombocytopenia), ,   Anesthesia Other Findings   Reproductive/Obstetrics                            Lab Results  Component Value Date   WBC 6.5 01/03/2018   HGB 13.7 01/03/2018   HCT 42.0 01/03/2018   MCV 102.7 (H) 01/03/2018   PLT 107 (L) 01/03/2018   Lab Results  Component Value Date   CREATININE 2.75 (H) 01/03/2018   BUN 47 (H) 01/03/2018   NA 146 (H) 01/03/2018   K 3.9 01/03/2018   CL 113 (H) 01/03/2018   CO2 24 01/03/2018     Anesthesia Physical Anesthesia Plan  ASA: IV  Anesthesia Plan: Regional and MAC   Post-op Pain Management:    Induction: Intravenous  PONV Risk Score and Plan: 2 and Propofol infusion and Ondansetron  Airway  Management Planned: Natural Airway and Simple Face Mask  Additional Equipment:   Intra-op Plan:   Post-operative Plan:   Informed Consent: I have reviewed the patients History and Physical, chart, labs and discussed the procedure including the risks, benefits and alternatives for the proposed anesthesia with the patient or authorized representative who has indicated his/her understanding and acceptance.   Dental advisory given  Plan Discussed with:   Anesthesia Plan Comments: (Block plus sedation, backup GA with LMA.)        Anesthesia Quick Evaluation

## 2018-01-04 NOTE — Transfer of Care (Signed)
Immediate Anesthesia Transfer of Care Note  Patient: Casey [name]  Procedure(s) Performed: OPEN REDUCTION INTERNAL FIXATION (ORIF) ANKLE FRACTURE (Left Ankle)  Patient Location: PACU  Anesthesia Type:MAC combined with regional for post-op pain  Level of Consciousness: awake, alert , oriented and patient cooperative  Airway & Oxygen Therapy: Patient Spontanous Breathing and Patient connected to nasal cannula oxygen  Post-op Assessment: Report given to RN, Post -op Vital signs reviewed and stable and Patient moving all extremities  Post vital signs: Reviewed and stable  Last Vitals:  Vitals Value Taken Time  BP 88/49 01/04/2018  9:11 PM  Temp    Pulse 77 01/04/2018  9:14 PM  Resp 22 01/04/2018  9:15 PM  SpO2 100 % 01/04/2018  9:14 PM  Vitals shown include unvalidated device data.  Last Pain:  Vitals:   01/04/18 1214  TempSrc: Oral  PainSc:       Patients Stated Pain Goal: 2 (29/51/88 4166)  Complications: No apparent anesthesia complications

## 2018-01-04 NOTE — Care Management Important Message (Signed)
Important Message  Patient Details  Name: Casey [name] MRN: [id number] Date of Birth: 1935-06-09   Medicare Important Message Given:  Yes    Orbie Pyo 01/04/2018, 3:25 PM

## 2018-01-04 NOTE — Progress Notes (Signed)
ANTICOAGULATION CONSULT NOTE - Initial Consult  Pharmacy Consult for warfarin Indication: atrial fibrillation  Allergies  Allergen Reactions  . Codeine Rash    Patient Measurements: Height: 5' 2.01" (157.5 cm) Weight: 160 lb (72.6 kg) IBW/kg (Calculated) : 50.12  Vital Signs: Temp: 97.9 F (36.6 C) (07/26 1214) Temp Source: Oral (07/26 1214) BP: 127/37 (07/26 1820) Pulse Rate: 75 (07/26 1820)  Labs: Recent Labs    01/01/18 2004 01/02/18 0323 01/03/18 0415 01/04/18 0408  HGB 14.1 14.4 13.7  --   HCT 44.6 44.7 42.0  --   PLT 118* 118* 107*  --   LABPROT 24.4*  --  26.1* 18.3*  INR 2.22  --  2.42 1.54  CREATININE 2.93* 2.58* 2.75*  --     Estimated Creatinine Clearance: 15 mL/min (A) (by C-G formula based on SCr of 2.75 mg/dL (H)).   Medical History: Past Medical History:  Diagnosis Date  . Atherosclerosis of aorta ([place])   . Biventricular ICD (implantable cardiac defibrillator) in place    medtronic   . Chronic kidney disease (CKD), stage III (moderate) (HCC)   . Chronic systolic heart failure ([place])   . Gout   . History of peptic ulcer disease   . Hypertension   . Hypothyroidism   . Lumbar disc disease   . Nonischemic cardiomyopathy ([place])   . Ventricular septal defect   . VT (ventricular tachycardia) ([place])    appropriate therapy via ICD 2013    Medications:  Infusions:  . sodium chloride      Assessment: 70 YOF presenting for surgery of left ankle on coumadin PTA for paroxysmal atrial fibrillation.  INR now down to 1.54 and going to surgery tonight.  H/H wnl, plts 107 pre-op.  PTA coumadin dosing of 2mg  daily with last dose taken on 7/23. Goal of Therapy:  INR 2-3 Monitor platelets by anticoagulation protocol: Yes   Plan:  F/u post-op for orthopedics OK to restart 7/27 Daily INR, CBC, s/s bleeding  Bertis Ruddy 01/04/2018,7:08 PM

## 2018-01-04 NOTE — Progress Notes (Signed)
PROGRESS NOTE  Casey [name]:[id number] DOB: 08/08/1934 DOA: 01/01/2018 PCP: Elby Showers, MD  HPI/Recap of past 24 hours: Casey Freeman McCormickis a 83 y.o.femalewithhistory of chronic systolic heart failure status post defibrillator placement and pacemaker, atrial fibrillation, chronic kidney disease stage IV, hypertension, gout who has had recent acute gouty attacks of left ankle and is on colchicine and has been having increasing bowel movements and had gone to the bathroom and on the way to the room patient felt dizzy and fell. Patient states she got entangled on a cloth on the way back. Denies losing consciousness hormone having any chest pain or palpitation. She hurt her left ankle and was brought to the ER.  01/03/2018: Patient seen and examined her bedside.  She has no new complaints, pain is well controlled.  Left ankle surgery postponned due to elevated INR.  01/04/18: Hypotensive in the setting of significant heart disease with chronic systolic CHF with EF of 23%.  Cardiology consulted for preop cardiovascular clearance.  Patient denies chest pain, dyspnea or palpitations.     Assessment/Plan: Principal Problem:   Closed left ankle fracture Active Problems:   Biventricular implantable cardioverter-defibrillator in situ   Nonischemic cardiomyopathy (HCC)   Paroxysmal atrial fibrillation (HCC)   Gout   Hypothyroidism   Kidney disease, chronic, stage III (GFR 30-59 ml/min) (HCC)   Ventricular septal defect (VSD)   Ankle fracture  Closed left ankle fracture status post mechanical fall Cardiology consulted for clearance Left ankle repair plan 01/04/2018 pending cardiology clearance Orthopedic surgery following Continue pain management Continue bowel regimen  Hypotension suspect iatrogenic Cardiology consulted to assist with adjustment of cardiac medications Doses of cardiac medications reduced per cardiology Continue to closely monitor vital  signs  Chronic systolic CHF with EF of 76% Caution with IV fluid Continue current medications as recommended by cardiology Strict I's and O's Daily weight Low-sodium diet less than 2 g/day  MR/VSD Cardiology following  Worsening AKI on CKD 3 Avoid nephrotoxic agents Repeat BMP am  Paroxysmal A. fib Rate controlled Hold Coumadin due to planned surgery  Gout Appears stable at this time  Hypothyroidism Continue Synthroid   Code Status: Full code  Family Communication: None at bedside  Disposition Plan: Home in 1 to 2 days when orthopedic surgery signs of   Consultants:  Orthopedic surgery  Procedures:  Left ankle repair planned on 01/04/2018  Antimicrobials:  None  DVT prophylaxis: Coumadin on hold due to planned procedure   Objective: Vitals:   01/03/18 2300 01/04/18 0413 01/04/18 0800 01/04/18 1214  BP: (!) 92/54 91/63 (!) 80/51 115/70  Pulse:  71 69 71  Resp:  16 14   Temp:  (!) 97.5 F (36.4 C) 98.1 F (36.7 C) 97.9 F (36.6 C)  TempSrc:  Oral Oral Oral  SpO2:  97% 100% 94%  Weight:      Height:        Intake/Output Summary (Last 24 hours) at 01/04/2018 1524 Last data filed at 01/04/2018 0500 Gross per 24 hour  Intake 120 ml  Output 300 ml  Net -180 ml   Filed Weights   01/01/18 1944  Weight: 72.6 kg (160 lb)    Exam:  . General: 83 y.o. year-old female well-developed well-nourished no acute distress.  Alert oriented x3.   . Cardiovascular: Regular rate and rhythm with no rubs or gallops.  No thyromegaly or JVD noted.  AICD on the left chest. . Respiratory: Clear to auscultation with no wheezes or rales. Good  inspiratory effort. . Abdomen: Soft nontender nondistended with normal bowel sounds x4 quadrants. . Musculoskeletal: Left ankle is wrapped in a cast . Psychiatry: Mood is appropriate for condition and setting   Data Reviewed: CBC: Recent Labs  Lab 01/01/18 2004 01/02/18 0323 01/03/18 0415  WBC 5.7 7.4 6.5  NEUTROABS  3.7  --   --   HGB 14.1 14.4 13.7  HCT 44.6 44.7 42.0  MCV 107.0* 104.2* 102.7*  PLT 118* 118* 458*   Basic Metabolic Panel: Recent Labs  Lab 01/01/18 2004 01/02/18 0323 01/03/18 0415  NA 144 143 146*  K 4.2 4.3 3.9  CL 113* 112* 113*  CO2 21* 21* 24  GLUCOSE 126* 127* 120*  BUN 54* 51* 47*  CREATININE 2.93* 2.58* 2.75*  CALCIUM 9.5 9.4 9.8   GFR: Estimated Creatinine Clearance: 15 mL/min (A) (by C-G formula based on SCr of 2.75 mg/dL (H)). Liver Function Tests: No results for input(s): AST, ALT, ALKPHOS, BILITOT, PROT, ALBUMIN in the last 168 hours. No results for input(s): LIPASE, AMYLASE in the last 168 hours. No results for input(s): AMMONIA in the last 168 hours. Coagulation Profile: Recent Labs  Lab 01/01/18 2004 01/03/18 0415 01/04/18 0408  INR 2.22 2.42 1.54   Cardiac Enzymes: No results for input(s): CKTOTAL, CKMB, CKMBINDEX, TROPONINI in the last 168 hours. BNP (last 3 results) No results for input(s): PROBNP in the last 8760 hours. HbA1C: No results for input(s): HGBA1C in the last 72 hours. CBG: No results for input(s): GLUCAP in the last 168 hours. Lipid Profile: No results for input(s): CHOL, HDL, LDLCALC, TRIG, CHOLHDL, LDLDIRECT in the last 72 hours. Thyroid Function Tests: No results for input(s): TSH, T4TOTAL, FREET4, T3FREE, THYROIDAB in the last 72 hours. Anemia Panel: No results for input(s): VITAMINB12, FOLATE, FERRITIN, TIBC, IRON, RETICCTPCT in the last 72 hours. Urine analysis:    Component Value Date/Time   COLORURINE YELLOW 12/17/2007 1501   APPEARANCEUR CLEAR 12/17/2007 1501   LABSPEC 1.014 12/17/2007 1501   PHURINE 5.5 12/17/2007 1501   GLUCOSEU NEGATIVE 12/17/2007 1501   HGBUR NEGATIVE 12/17/2007 1501   BILIRUBINUR neg 06/10/2015 1019   KETONESUR NEGATIVE 12/17/2007 1501   PROTEINUR neg 06/10/2015 1019   PROTEINUR NEGATIVE 12/17/2007 1501   UROBILINOGEN 0.2 06/10/2015 1019   UROBILINOGEN 1.0 12/17/2007 1501   NITRITE neg  06/10/2015 1019   NITRITE NEGATIVE 12/17/2007 1501   LEUKOCYTESUR Negative 06/10/2015 1019   Sepsis Labs: @LABRCNTIP (procalcitonin:4,lacticidven:4)  ) Recent Results (from the past 240 hour(s))  Surgical pcr screen     Status: Abnormal   Collection Time: 01/02/18  9:20 PM  Result Value Ref Range Status   MRSA, PCR NEGATIVE NEGATIVE Final   Staphylococcus aureus POSITIVE (A) NEGATIVE Final    Comment: (NOTE) The Xpert SA Assay (FDA approved for NASAL specimens in patients 61 years of age and older), is one component of a comprehensive surveillance program. It is not intended to diagnose infection nor to guide or monitor treatment.       Studies: No results found.  Scheduled Meds: . amiodarone  200 mg Oral Daily  . chlorhexidine  60 mL Topical Once  . dorzolamide  1 drop Both Eyes TID  . etomidate  12 mg Intravenous Once  . hydrALAZINE  37.5 mg Oral Q8H  . isosorbide mononitrate  30 mg Oral Daily  . latanoprost  1 drop Both Eyes QHS  . levothyroxine  75 mcg Oral QAC breakfast  . metoprolol succinate  50 mg Oral Daily  .  multivitamin with minerals  1 tablet Oral Daily  . potassium chloride SA  20 mEq Oral Daily  . povidone-iodine  2 application Topical Once  . torsemide  60 mg Oral Daily    Continuous Infusions:   LOS: 2 days     Kayleen Memos, MD [facility] Pager [phone]  If 7PM-7AM, please contact night-coverage [email or web address] Password [facility] 01/04/2018, 3:24 PM

## 2018-01-05 DIAGNOSIS — S82891A Other fracture of right lower leg, initial encounter for closed fracture: Secondary | ICD-10-CM

## 2018-01-05 LAB — CBC
HCT: 43.8 % (ref 36.0–46.0)
HEMOGLOBIN: 13.8 g/dL (ref 12.0–15.0)
MCH: 33.7 pg (ref 26.0–34.0)
MCHC: 31.5 g/dL (ref 30.0–36.0)
MCV: 106.8 fL — ABNORMAL HIGH (ref 78.0–100.0)
PLATELETS: 96 10*3/uL — AB (ref 150–400)
RBC: 4.1 MIL/uL (ref 3.87–5.11)
RDW: 11.9 % (ref 11.5–15.5)
WBC: 8.4 10*3/uL (ref 4.0–10.5)

## 2018-01-05 LAB — PROTIME-INR
INR: 1.27
PROTHROMBIN TIME: 15.8 s — AB (ref 11.4–15.2)

## 2018-01-05 MED ORDER — WARFARIN SODIUM 4 MG PO TABS
4.0000 mg | ORAL_TABLET | Freq: Once | ORAL | Status: AC
  Administered 2018-01-05: 4 mg via ORAL
  Filled 2018-01-05: qty 1

## 2018-01-05 MED ORDER — WARFARIN - PHARMACIST DOSING INPATIENT: Freq: Every day | Status: DC

## 2018-01-05 NOTE — Op Note (Signed)
NAME: Casey [name], Casey [name] MEDICAL RECORD [id number] ACCOUNT [id number] DATE OF BIRTH:1935-01-26 FACILITY: [facility] LOCATION: [facility] PHYSICIAN:GREGORY Randel Pigg, MD  OPERATIVE REPORT  DATE OF PROCEDURE:  01/04/2018  PREOPERATIVE DIAGNOSIS:  Unstable bimalleolar left ankle fracture.  POSTOPERATIVE DIAGNOSIS:  Unstable bimalleolar left ankle fracture.  PROCEDURE:  Left ankle bimalleolar fracture fixation using Zimmer stainless steel plates on the lateral side and one 3.5 cannulated screw on the medial side.  SURGEON:  Meredith Pel, MD  ASSISTANT:  None.  INDICATIONS:  The patient is an 83 year old patient with significant heart issues who sustained a bimalleolar ankle fracture and presents now for operative management after explanation of risks and benefits.  PROCEDURE IN DETAIL:  The patient was brought to the operating room where regional anesthetic was induced.  Preoperative antibiotics administered.  Timeout was called.  On the dorsal aspect of the foot, the patient had about a quarter-sized area of skin  that was peeling away.  This was not a fracture blister, but was some type of pressure response.  The medial and lateral side skin was completely clear.  Following sterile prepping and draping, Ioban was used to cover the operative field.  Timeout was  called.  An ankle Esmarch utilized for approximately 40 minutes.  A lateral incision was made.  Fracture was identified.  Care was taken to avoid injury to superficial peroneal nerve.  Fracture was reduced.  Bone quality poor.  A cortical screw placed in  lag fashion anterior proximal to distal.  Correct screw length confirmed in AP and lateral planes under fluoroscopy.  Lateral plate was then applied with proximal cortical nonlocking screws and distal locking screws placed, which were not bicortical.   All in all stable fixation of the lateral malleolus was achieved.  Thorough irrigation and a moist sponge was placed on that side.   Attention then directed towards the medial side.  Incision made over the medial malleolus.  A very small fracture was  present.  It was reduced and one 3.5 cancellous screw was placed with good fixation achieved.  At this time, thorough irrigation of both incisions was performed.  An ankle Esmarch released.  Bleeding points encountered were controlled using  electrocautery.  It should be noted that fairly minimal bleeding from the skin was present.  Not a robust amount of bleeding.  That was communicated to the patient postoperatively in terms of potential for postop complications.  A very well-padded  posterior splint only with no stirrup was placed.  The incisions were closed using 0 Vicryl, 3-0 Vicryl and 3-0 nylon.  Xeroform was applied along with a splint.  The patient tolerated the procedure well without immediate complications.  She was  transferred to the recovery room in stable condition.  TN/NUANCE  D:01/04/2018 T:01/05/2018 JOB:[id number]

## 2018-01-05 NOTE — Progress Notes (Signed)
ANTICOAGULATION CONSULT NOTE - Follow Up Consult  Pharmacy Consult for Coumadin Indication: atrial fibrillation  Allergies  Allergen Reactions  . Codeine Rash    Patient Measurements: Height: 5' 2.01" (157.5 cm) Weight: 160 lb (72.6 kg) IBW/kg (Calculated) : 50.12  Vital Signs: Temp: 97.4 F (36.3 C) (07/27 0426) Temp Source: Oral (07/27 0426) BP: 89/49 (07/27 0426) Pulse Rate: 67 (07/27 0426)  Labs: Recent Labs    01/03/18 0415 01/04/18 0408 01/05/18 0242  HGB 13.7  --  13.8  HCT 42.0  --  43.8  PLT 107*  --  96*  LABPROT 26.1* 18.3* 15.8*  INR 2.42 1.54 1.27  CREATININE 2.75*  --   --     Estimated Creatinine Clearance: 15 mL/min (A) (by C-G formula based on SCr of 2.75 mg/dL (H)).   Assessment:  Anticoag: Afib on Coumadin PTA. Vitamin K 5 mg PO 7/25. INR 1.27. CBC WNL. - PTA Coumadin 2mg  daily with admit INR 2.42  Goal of Therapy:  INR 2-3 Monitor platelets by anticoagulation protocol: Yes   Plan:  Coumadin 4mg  po x 1 tonight Daily INR  Mirielle Byrum S. Alford Highland, PharmD, BCPS Clinical Staff Pharmacist Pager [phone]  Eilene Ghazi Stillinger 01/05/2018,7:15 AM

## 2018-01-05 NOTE — NC FL2 (Addendum)
[place] LEVEL OF CARE SCREENING TOOL     IDENTIFICATION  Patient Name: Casey [name] Birthdate: 29-Jun-1934 Sex: female Admission Date (Current Location): 01/01/2018  [facility] and [place] Number:  Herbalist and Address:  [facility], [place] [address], [place], [place] [address]      Provider Number: [id number]  Attending Physician Name and Address:  Kayleen Memos, DO  Relative Name and Phone Number:       Current Level of Care: Hospital Recommended Level of Care: [place] Prior Approval Number: [id number]   Date Approved/Denied:   PASRR Number:    Discharge Plan: SNF    Current Diagnoses: Patient Active Problem List   Diagnosis Date Noted  . Closed displaced bimalleolar fracture of left lower leg   . Ankle fracture 01/02/2018  . Closed left ankle fracture 01/02/2018  . History of peptic ulcer disease   . Atherosclerosis of aorta ([place])   . Bilateral hearing loss 12/09/2015  . Kidney disease, chronic, stage III (GFR 30-59 ml/min) (HCC)   . Ventricular septal defect (VSD)   . Long-term (current) use of anticoagulants   . Hypothyroidism 03/11/2013  . Gout   . Vitamin D deficiency 03/06/2011  . Glaucoma   . Anxiety   . Spinal stenosis   . Biventricular implantable cardioverter-defibrillator in situ   . Nonischemic cardiomyopathy ([place])   . Paroxysmal atrial fibrillation (HCC)   . Hyperlipidemia   . Ventricular tachycardia   . Hypertensive heart disease   . Chronic systolic CHF (congestive heart failure) (HCC)     Orientation RESPIRATION BLADDER Height & Weight     Self, Time, Situation, Place  O2(Nasal cannula 2L) Continent Weight: 160 lb (72.6 kg) Height:  5' 2.01" (157.5 cm)  BEHAVIORAL SYMPTOMS/MOOD NEUROLOGICAL BOWEL NUTRITION STATUS      Continent Diet(Regular diet, thin liquids)  AMBULATORY STATUS COMMUNICATION OF NEEDS Skin   Limited Assist Verbally Surgical wounds(Left leg,  compression wrapped)                       Personal Care Assistance Level of Assistance  Feeding, Dressing, Bathing Bathing Assistance: Limited assistance Feeding assistance: Independent Dressing Assistance: Limited assistance     Functional Limitations Info  Sight, Hearing, Speech Sight Info: Adequate Hearing Info: Adequate Speech Info: Adequate    SPECIAL CARE FACTORS FREQUENCY  PT (By licensed PT), OT (By licensed OT)     PT Frequency: 4x OT Frequency: 4x            Contractures Contractures Info: Not present    Additional Factors Info  Code Status, Allergies Code Status Info: Full Code Allergies Info: Codeine           Current Medications (01/05/2018):  This is the current hospital active medication list Current Facility-Administered Medications  Medication Dose Route Frequency Provider Last Rate Last Dose  . acetaminophen (TYLENOL) tablet 325-650 mg  325-650 mg Oral Q6H PRN Meredith Pel, MD   650 mg at 01/05/18 1016  . amiodarone (PACERONE) tablet 200 mg  200 mg Oral Daily Meredith Pel, MD   200 mg at 01/05/18 1019  . docusate sodium (COLACE) capsule 100 mg  100 mg Oral BID Meredith Pel, MD   100 mg at 01/05/18 1020  . dorzolamide (TRUSOPT) 2 % ophthalmic solution 1 drop  1 drop Both Eyes TID Meredith Pel, MD   1 drop at 01/05/18 1020  .  etomidate (AMIDATE) injection 12 mg  12 mg Intravenous Once Meredith Pel, MD      . fentaNYL (SUBLIMAZE) injection 25 mcg  25 mcg Intravenous Q2H PRN Meredith Pel, MD      . hydrALAZINE (APRESOLINE) tablet 37.5 mg  37.5 mg Oral Q8H Meredith Pel, MD   37.5 mg at 01/02/18 2107  . HYDROcodone-acetaminophen (NORCO/VICODIN) 5-325 MG per tablet 1 tablet  1 tablet Oral Q4H PRN Meredith Pel, MD   1 tablet at 01/05/18 1019  . isosorbide mononitrate (IMDUR) 24 hr tablet 30 mg  30 mg Oral Daily Meredith Pel, MD   30 mg at 01/05/18 1019  . latanoprost (XALATAN) 0.005 %  ophthalmic solution 1 drop  1 drop Both Eyes QHS Meredith Pel, MD   1 drop at 01/03/18 2318  . levothyroxine (SYNTHROID, LEVOTHROID) tablet 75 mcg  75 mcg Oral QAC breakfast Meredith Pel, MD   75 mcg at 01/05/18 1016  . methocarbamol (ROBAXIN) tablet 500 mg  500 mg Oral Q6H PRN Meredith Pel, MD   500 mg at 01/05/18 0015   Or  . methocarbamol (ROBAXIN) 500 mg in dextrose 5 % 50 mL IVPB  500 mg Intravenous Q6H PRN Meredith Pel, MD      . metoCLOPramide (REGLAN) tablet 5-10 mg  5-10 mg Oral Q8H PRN Meredith Pel, MD       Or  . metoCLOPramide (REGLAN) injection 5-10 mg  5-10 mg Intravenous Q8H PRN Meredith Pel, MD      . metoprolol succinate (TOPROL-XL) 24 hr tablet 50 mg  50 mg Oral Daily Meredith Pel, MD   50 mg at 01/03/18 0914  . multivitamin with minerals tablet 1 tablet  1 tablet Oral Daily Meredith Pel, MD   1 tablet at 01/05/18 1018  . ondansetron (ZOFRAN) tablet 4 mg  4 mg Oral Q6H PRN Meredith Pel, MD       Or  . ondansetron [facility]) injection 4 mg  4 mg Intravenous Q6H PRN Meredith Pel, MD      . potassium chloride SA (K-DUR,KLOR-CON) CR tablet 20 mEq  20 mEq Oral Daily Meredith Pel, MD   20 mEq at 01/05/18 1018  . torsemide (DEMADEX) tablet 60 mg  60 mg Oral Daily Meredith Pel, MD   60 mg at 01/05/18 1018  . traMADol (ULTRAM) tablet 50 mg  50 mg Oral Q12H Meredith Pel, MD   50 mg at 01/05/18 0015  . warfarin (COUMADIN) tablet 4 mg  4 mg Oral ONCE-1800 Alford Highland [place], [place]      . Warfarin - Pharmacist Dosing Inpatient   Does not apply q1800 Karren Cobble [facility]         Discharge Medications: Please see discharge summary for a list of discharge medications.  Relevant Imaging Results:  Relevant Lab Results:   Additional Information SSN: [id number]  Eileen Stanford, LCSW

## 2018-01-05 NOTE — Evaluation (Signed)
Physical Therapy Evaluation Patient Details Name: Casey [name] MRN: [id number] DOB: 1934-09-06 Today's Date: 01/05/2018   History of Present Illness  Patient is an 83 year old fermale S/P left ankle ORIF on 01/04/2018. PMH: tachycardia, septal defect; lumbar ddd; gout, ulcers, CHF, CKD; atheroscleroiss   Clinical Impression  Patient requires assistance for all transfers. Her gait distance is limited. She lives alone. She would benefit from rehab at a SNF. She was seen for a low complexity eval.     Follow Up Recommendations SNF    Equipment Recommendations  Rolling walker with 5" wheels    Recommendations for Other Services Rehab consult     Precautions / Restrictions Precautions Precautions: Fall Restrictions Weight Bearing Restrictions: Yes LLE Weight Bearing: Non weight bearing      Mobility  Bed Mobility               General bed mobility comments: Patient found in chair. Per patient and family she was able to sit up without assit of nursing but the nursing was gaurdingh the patient. She declined to get back to bed with PT.   Transfers Overall transfer level: Needs assistance Equipment used: Rolling walker (2 wheeled) Transfers: Sit to/from Stand Sit to Stand: Min guard         General transfer comment: min gaurd and mod cuing for weight bearing. Sit to stand 2x with therapy.   Ambulation/Gait Ambulation/Gait assistance: Min assist Gait Distance (Feet): 12 Feet(6'x2 with chair follow ) Assistive device: Rolling walker (2 wheeled) Gait Pattern/deviations: Step-to pattern     General Gait Details: non weight bearing on the left. Had difficulty initiating gait but once she go t going her technique improved. Min a for balance with gait.    Stairs            Wheelchair Mobility    Modified Rankin (Stroke Patients Only)       Balance Overall balance assessment: Needs assistance Sitting-balance support: No upper extremity  supported Sitting balance-Leahy Scale: Good     Standing balance support: Bilateral upper extremity supported Standing balance-Leahy Scale: Good                               Pertinent Vitals/Pain Pain Assessment: Faces Faces Pain Scale: Hurts little more Pain Location: left ankle  Pain Descriptors / Indicators: Aching Pain Intervention(s): Limited activity within patient's tolerance;Monitored during session;Repositioned;Ice applied    Home Living Family/patient expects to be discharged to:: Skilled nursing facility Living Arrangements: Alone               Additional Comments: pt's husband is at [facility] right now (after CVA) so she is living alone. Her daughter assists when she asks for help but she does not live in [place]. She was driving prior to her fall. She has 2 steps into her house     Prior Function Level of Independence: Independent with assistive device(s)         Comments: pt has been using rollator since falling in the summer (though did not have it with her for subsequent falls). She reports she was independent but sometimes gets dizzy     Hand Dominance   Dominant Hand: Right    Extremity/Trunk Assessment   Upper Extremity Assessment Upper Extremity Assessment: Overall WFL for tasks assessed    Lower Extremity Assessment Lower Extremity Assessment: LLE deficits/detail LLE: Unable to fully assess due to pain;Unable to fully assess  due to immobilization LLE Sensation: WNL LLE Coordination: WNL    Cervical / Trunk Assessment Cervical / Trunk Assessment: Normal  Communication   Communication: No difficulties  Cognition Arousal/Alertness: Awake/alert Behavior During Therapy: WFL for tasks assessed/performed Overall Cognitive Status: Within Functional Limits for tasks assessed                                        General Comments General comments (skin integrity, edema, etc.): ankle in cast     Exercises      Assessment/Plan    PT Assessment Patient needs continued PT services  PT Problem List Decreased strength;Decreased range of motion;Decreased activity tolerance;Decreased mobility;Decreased knowledge of use of DME;Pain       PT Treatment Interventions DME instruction;Gait training;Stair training;Therapeutic activities;Therapeutic exercise;Patient/family education;Functional mobility training    PT Goals (Current goals can be found in the Care Plan section)  Acute Rehab PT Goals Patient Stated Goal: to walk better  PT Goal Formulation: With patient Time For Goal Achievement: 01/12/18 Potential to Achieve Goals: Good    Frequency Min 4X/week   Barriers to discharge Decreased caregiver support;Inaccessible home environment lives alone. Patients husband is in [facility] PT "6 Clicks" Daily Activity  Outcome Measure Difficulty turning over in bed (including adjusting bedclothes, sheets and blankets)?: A Little Difficulty moving from lying on back to sitting on the side of the bed? : A Little Difficulty sitting down on and standing up from a chair with arms (e.g., wheelchair, bedside commode, etc,.)?: A Little Help needed moving to and from a bed to chair (including a wheelchair)?: A Lot Help needed walking in hospital room?: A Lot Help needed climbing 3-5 steps with a railing? : Total 6 Click Score: 14    End of Session Equipment Utilized During Treatment: Gait belt Activity Tolerance: Patient tolerated treatment well Patient left: with call bell/phone within reach;in chair;with family/visitor present Nurse Communication: Mobility status PT Visit Diagnosis: Unsteadiness on feet (R26.81);Difficulty in walking, not elsewhere classified (R26.2);Pain Pain - Right/Left: Left Pain - part of body: Ankle and joints of foot    Time: 0200-0220 PT Time Calculation (min) (ACUTE ONLY): 20 min   Charges:   PT Evaluation $PT Eval Moderate  Complexity: 1 Mod           Carney Living PT DPT  01/05/2018, 4:03 PM

## 2018-01-05 NOTE — Progress Notes (Addendum)
PROGRESS NOTE  Casey [name] NLZ:[id number] DOB: 1934/07/13 DOA: 01/01/2018 PCP: Elby Showers, MD  HPI/Recap of past 24 hours: Casey Pritt McCormickis a 83 y.o.femalewithhistory of chronic systolic heart failure status post defibrillator placement and pacemaker, atrial fibrillation, chronic kidney disease stage IV, hypertension, gout who has had recent acute gouty attacks of left ankle and is on colchicine and has been having increasing bowel movements and had gone to the bathroom and on the way to the room patient felt dizzy and fell. Patient states she got entangled on a cloth on the way back. Denies losing consciousness hormone having any chest pain or palpitation. She hurt her left ankle and was brought to the ER.  01/03/2018: Patient seen and examined her bedside.  She has no new complaints, pain is well controlled.  Left ankle surgery postponned due to elevated INR.  01/04/18: Hypotensive in the setting of significant heart disease with chronic systolic CHF with EF of 90%.  Cardiology consulted for preop cardiovascular clearance.  Patient denies chest pain, dyspnea or palpitations.  01/05/18: States she has no pain in her left ankle.  POD #1 post left ankle bimalleolar fracture fixation.  Denies chest pain, dyspnea or palpitation.       Assessment/Plan: Principal Problem:   Closed left ankle fracture Active Problems:   Biventricular implantable cardioverter-defibrillator in situ   Nonischemic cardiomyopathy (HCC)   Paroxysmal atrial fibrillation (HCC)   Gout   Hypothyroidism   Kidney disease, chronic, stage III (GFR 30-59 ml/min) (HCC)   Ventricular septal defect (VSD)   Ankle fracture   Closed displaced bimalleolar fracture of left lower leg  POD #1 post left ankle bimalleolar fracture fixation Status post mechanical fall Left ankle repair 01/04/2018 Orthopedic surgery following Continue pain management Continue bowel regimen  Hypotension suspect  iatrogenic Cardiology consulted to assist with adjustment of cardiac medications Doses of cardiac medications reduced per cardiology Continue to closely monitor vital signs Maintain map >24  Chronic systolic CHF with EF of 09% Caution with IV fluid Continue current medications as recommended by cardiology Strict I's and O's Daily weight Low-sodium diet less than 2 g/day  MR/VSD Cardiology following  Worsening AKI on CKD 3 Avoid nephrotoxic agents Repeat BMP in the morning  Subtherapeutic INR INR today 1.27 Pharmacy managing INR Resume Coumadin tonight as recommended by orthopedic surgery  Paroxysmal A. fib Rate controlled Resume Coumadin tonight  Gout Appears stable at this time  Hypothyroidism Continue Synthroid   Code Status: Full code  Family Communication: None at bedside  Disposition Plan: Home in 1 to 2 days when orthopedic surgery signs off   Consultants:  Orthopedic surgery  Procedures:  Left ankle repair on 01/04/2018  Antimicrobials:  None  DVT prophylaxis: Coumadin on hold due to planned procedure   Objective: Vitals:   01/05/18 0029 01/05/18 0103 01/05/18 0426 01/05/18 1016  BP: (!) 81/52 (!) 93/52 (!) 89/49 (!) 104/50  Pulse: 75 70 67 60  Resp: 16  16   Temp: 97.7 F (36.5 C)  (!) 97.4 F (36.3 C)   TempSrc: Oral  Oral   SpO2: 97%  100%   Weight:      Height:        Intake/Output Summary (Last 24 hours) at 01/05/2018 1336 Last data filed at 01/05/2018 1000 Gross per 24 hour  Intake 375 ml  Output 1430 ml  Net -1055 ml   Filed Weights   01/01/18 1944 01/04/18 1802  Weight: 72.6 kg (160 lb) 72.6 kg (  160 lb)    Exam:  . General: 83 y.o. year-old female well-developed well-nourished no acute distress.  Alert oriented x3..  Cardiovascular: Regular rate and rhythm with no rubs or gallops.  No thyromegaly or JVD noted. Marland Kitchen Ultrasound respiratory: Clear to auscultation with no wheezes or rales. Good inspiratory  effort. . Abdomen: Soft nontender nondistended with normal bowel sounds x4 quadrants. . Musculoskeletal: Left ankle in surgical cast . Psychiatry: Mood is appropriate for condition and setting   Data Reviewed: CBC: Recent Labs  Lab 01/01/18 2004 01/02/18 0323 01/03/18 0415 01/05/18 0242  WBC 5.7 7.4 6.5 8.4  NEUTROABS 3.7  --   --   --   HGB 14.1 14.4 13.7 13.8  HCT 44.6 44.7 42.0 43.8  MCV 107.0* 104.2* 102.7* 106.8*  PLT 118* 118* 107* 96*   Basic Metabolic Panel: Recent Labs  Lab 01/01/18 2004 01/02/18 0323 01/03/18 0415  NA 144 143 146*  K 4.2 4.3 3.9  CL 113* 112* 113*  CO2 21* 21* 24  GLUCOSE 126* 127* 120*  BUN 54* 51* 47*  CREATININE 2.93* 2.58* 2.75*  CALCIUM 9.5 9.4 9.8   GFR: Estimated Creatinine Clearance: 15 mL/min (A) (by C-G formula based on SCr of 2.75 mg/dL (H)). Liver Function Tests: No results for input(s): AST, ALT, ALKPHOS, BILITOT, PROT, ALBUMIN in the last 168 hours. No results for input(s): LIPASE, AMYLASE in the last 168 hours. No results for input(s): AMMONIA in the last 168 hours. Coagulation Profile: Recent Labs  Lab 01/01/18 2004 01/03/18 0415 01/04/18 0408 01/05/18 0242  INR 2.22 2.42 1.54 1.27   Cardiac Enzymes: No results for input(s): CKTOTAL, CKMB, CKMBINDEX, TROPONINI in the last 168 hours. BNP (last 3 results) No results for input(s): PROBNP in the last 8760 hours. HbA1C: No results for input(s): HGBA1C in the last 72 hours. CBG: No results for input(s): GLUCAP in the last 168 hours. Lipid Profile: No results for input(s): CHOL, HDL, LDLCALC, TRIG, CHOLHDL, LDLDIRECT in the last 72 hours. Thyroid Function Tests: No results for input(s): TSH, T4TOTAL, FREET4, T3FREE, THYROIDAB in the last 72 hours. Anemia Panel: No results for input(s): VITAMINB12, FOLATE, FERRITIN, TIBC, IRON, RETICCTPCT in the last 72 hours. Urine analysis:    Component Value Date/Time   COLORURINE YELLOW 12/17/2007 1501   APPEARANCEUR CLEAR  12/17/2007 1501   LABSPEC 1.014 12/17/2007 1501   PHURINE 5.5 12/17/2007 1501   GLUCOSEU NEGATIVE 12/17/2007 1501   HGBUR NEGATIVE 12/17/2007 1501   BILIRUBINUR neg 06/10/2015 1019   KETONESUR NEGATIVE 12/17/2007 1501   PROTEINUR neg 06/10/2015 1019   PROTEINUR NEGATIVE 12/17/2007 1501   UROBILINOGEN 0.2 06/10/2015 1019   UROBILINOGEN 1.0 12/17/2007 1501   NITRITE neg 06/10/2015 1019   NITRITE NEGATIVE 12/17/2007 1501   LEUKOCYTESUR Negative 06/10/2015 1019   Sepsis Labs: @LABRCNTIP (procalcitonin:4,lacticidven:4)  ) Recent Results (from the past 240 hour(s))  Surgical pcr screen     Status: Abnormal   Collection Time: 01/02/18  9:20 PM  Result Value Ref Range Status   MRSA, PCR NEGATIVE NEGATIVE Final   Staphylococcus aureus POSITIVE (A) NEGATIVE Final    Comment: (NOTE) The Xpert SA Assay (FDA approved for NASAL specimens in patients 68 years of age and older), is one component of a comprehensive surveillance program. It is not intended to diagnose infection nor to guide or monitor treatment.       Studies: Dg Ankle Left Port  Result Date: 01/04/2018 CLINICAL DATA:  Postop ankle fracture repair. EXAM: PORTABLE LEFT ANKLE - 2  VIEW COMPARISON:  January 01, 2018 FINDINGS: A plate is affixed to the distal fibula across the recently identified fracture. There is a screw through the medial malleolar fracture as well. A rod in the distal tibia remains. Hardware appears to be in good position. IMPRESSION: Ankle fracture repair as above. Hardware appears to be in good position. Electronically Signed   By: Dorise Bullion III M.D   On: 01/04/2018 21:36    Scheduled Meds: . amiodarone  200 mg Oral Daily  . docusate sodium  100 mg Oral BID  . dorzolamide  1 drop Both Eyes TID  . etomidate  12 mg Intravenous Once  . hydrALAZINE  37.5 mg Oral Q8H  . isosorbide mononitrate  30 mg Oral Daily  . latanoprost  1 drop Both Eyes QHS  . levothyroxine  75 mcg Oral QAC breakfast  . metoprolol  succinate  50 mg Oral Daily  . multivitamin with minerals  1 tablet Oral Daily  . potassium chloride SA  20 mEq Oral Daily  . torsemide  60 mg Oral Daily  . traMADol  50 mg Oral Q12H  . warfarin  4 mg Oral ONCE-1800  . Warfarin - Pharmacist Dosing Inpatient   Does not apply q1800    Continuous Infusions: . methocarbamol (ROBAXIN) IV       LOS: 3 days     Kayleen Memos, MD [facility] Pager [phone]  If 7PM-7AM, please contact night-coverage [email or web address] Password [facility] 01/05/2018, 1:36 PM

## 2018-01-05 NOTE — Discharge Instructions (Addendum)
Ankle Fracture A fracture is a break in a bone. A cast or splint may be used to protect the ankle and heal the break. Sometimes, surgery is needed. Follow these instructions at home:  Use crutches as told by your doctor. It is very important that you use your crutches correctly.  Do not put weight or pressure on the injured ankle until told by your doctor.  Keep your ankle raised (elevated) when sitting or lying down.  Apply ice to the ankle: ? Put ice in a plastic bag. ? Place a towel between your cast and the bag. ? Leave the ice on for 20 minutes, 2-3 times a day.  If you have a plaster or fiberglass cast: ? Do not try to scratch under the cast with any objects. ? Check the skin around the cast every day. You may put lotion on red or sore areas. ? Keep your cast dry and clean.  If you have a plaster splint: ? Wear the splint as told by your doctor. ? You can loosen the elastic around the splint if your toes get numb, tingle, or turn cold or blue.  Do not put pressure on any part of your cast or splint. It may break. Rest your plaster splint or cast only on a pillow the first 24 hours until it is fully hardened.  Cover your cast or splint with a plastic bag during showers.  Do not lower your cast or splint into water.  Take medicine as told by your doctor.  Do not drive until your doctor says it is safe.  Follow-up with your doctor as told. It is very important that you go to your follow-up visits. Contact a doctor if: The swelling and discomfort gets worse. Get help right away if:  Your splint or cast breaks.  You continue to have very bad pain.  You have new pain or swelling after your splint or cast was put on.  Your skin or toes below the injured ankle: ? Turn blue or gray. ? Feel cold, numb, or you cannot feel them.  There is a bad smell or yellowish white fluid (pus) coming from under the splint or cast. This information is not intended to replace advice  given to you by your health care provider. Make sure you discuss any questions you have with your health care provider. Document Released: 03/26/2009 Document Revised: 11/04/2015 Document Reviewed: 12/26/2012 Elsevier Interactive Patient Education  2017 [employer].   Atrial Fibrillation Atrial fibrillation is a type of heartbeat that is irregular or fast (rapid). If you have this condition, your heart keeps quivering in a weird (chaotic) way. This condition can make it so your heart cannot pump blood normally. Having this condition gives a person more risk for stroke, heart failure, and other heart problems. There are different types of atrial fibrillation. Talk with your doctor to learn about the type that you have. Follow these instructions at home:  Take over-the-counter and prescription medicines only as told by your doctor.  If your doctor prescribed a blood-thinning medicine, take it exactly as told. Taking too much of it can cause bleeding. If you do not take enough of it, you will not have the protection that you need against stroke and other problems.  Do not use any tobacco products. These include cigarettes, chewing tobacco, and e-cigarettes. If you need help quitting, ask your doctor.  If you have apnea (obstructive sleep apnea), manage it as told by your doctor.  Do  not drink alcohol.  Do not drink beverages that have caffeine. These include coffee, soda, and tea.  Maintain a healthy weight. Do not use diet pills unless your doctor says they are safe for you. Diet pills may make heart problems worse.  Follow diet instructions as told by your doctor.  Exercise regularly as told by your doctor.  Keep all follow-up visits as told by your doctor. This is important. Contact a doctor if:  You notice a change in the speed, rhythm, or strength of your heartbeat.  You are taking a blood-thinning medicine and you notice more bruising.  You get tired more easily when you move  or exercise. Get help right away if:  You have pain in your chest or your belly (abdomen).  You have sweating or weakness.  You feel sick to your stomach (nauseous).  You notice blood in your throw up (vomit), poop (stool), or pee (urine).  You are short of breath.  You suddenly have swollen feet and ankles.  You feel dizzy.  Your suddenly get weak or numb in your face, arms, or legs, especially if it happens on one side of your body.  You have trouble talking, trouble understanding, or both.  Your face or your eyelid droops on one side. These symptoms may be an emergency. Do not wait to see if the symptoms will go away. Get medical help right away. Call your local emergency services (911 in the [place] Do not drive yourself to the hospital. This information is not intended to replace advice given to you by your health care provider. Make sure you discuss any questions you have with your health care provider. Document Released: 03/07/2008 Document Revised: 11/04/2015 Document Reviewed: 09/23/2014 Elsevier Interactive Patient Education  2018 [employer] on my medicine - Coumadin   (Warfarin)  Why was Coumadin prescribed for you? Coumadin was prescribed for you because you have a blood clot or a medical condition that can cause an increased risk of forming blood clots. Blood clots can cause serious health problems by blocking the flow of blood to the heart, lung, or brain. Coumadin can prevent harmful blood clots from forming. As a reminder your indication for Coumadin is:   Stroke Prevention Because Of Atrial Fibrillation  What test will check on my response to Coumadin? While on Coumadin (warfarin) you will need to have an INR test regularly to ensure that your dose is keeping you in the desired range. The INR (international normalized ratio) number is calculated from the result of the laboratory test called prothrombin time (PT).  If an INR APPOINTMENT HAS NOT  ALREADY BEEN MADE FOR YOU please schedule an appointment to have this lab work done by your health care provider within 7 days. Your INR goal is usually a number between:  2 to 3 or your provider may give you a more narrow range like 2-2.5.  Ask your health care provider during an office visit what your goal INR is.  What  do you need to  know  About  COUMADIN? Take Coumadin (warfarin) exactly as prescribed by your healthcare provider about the same time each day.  DO NOT stop taking without talking to the doctor who prescribed the medication.  Stopping without other blood clot prevention medication to take the place of Coumadin may increase your risk of developing a new clot or stroke.  Get refills before you run out.  What do you do if you miss a dose? If you miss a  dose, take it as soon as you remember on the same day then continue your regularly scheduled regimen the next day.  Do not take two doses of Coumadin at the same time.  Important Safety Information A possible side effect of Coumadin (Warfarin) is an increased risk of bleeding. You should call your healthcare provider right away if you experience any of the following: ? Bleeding from an injury or your nose that does not stop. ? Unusual colored urine (red or dark brown) or unusual colored stools (red or black). ? Unusual bruising for unknown reasons. ? A serious fall or if you hit your head (even if there is no bleeding).  Some foods or medicines interact with Coumadin (warfarin) and might alter your response to warfarin. To help avoid this: ? Eat a balanced diet, maintaining a consistent amount of Vitamin K. ? Notify your provider about major diet changes you plan to make. ? Avoid alcohol or limit your intake to 1 drink for women and 2 drinks for men per day. (1 drink is 5 oz. wine, 12 oz. beer, or 1.5 oz. liquor.)  Make sure that ANY health care provider who prescribes medication for you knows that you are taking Coumadin  (warfarin).  Also make sure the healthcare provider who is monitoring your Coumadin knows when you have started a new medication including herbals and non-prescription products.  Coumadin (Warfarin)  Major Drug Interactions  Increased Warfarin Effect Decreased Warfarin Effect  Alcohol (large quantities) Antibiotics (esp. Septra/Bactrim, Flagyl, Cipro) Amiodarone (Cordarone) Aspirin (ASA) Cimetidine (Tagamet) Megestrol (Megace) NSAIDs (ibuprofen, naproxen, etc.) Piroxicam (Feldene) Propafenone (Rythmol SR) Propranolol (Inderal) Isoniazid (INH) Posaconazole (Noxafil) Barbiturates (Phenobarbital) Carbamazepine (Tegretol) Chlordiazepoxide (Librium) Cholestyramine (Questran) Griseofulvin Oral Contraceptives Rifampin Sucralfate (Carafate) Vitamin K   Coumadin (Warfarin) Major Herbal Interactions  Increased Warfarin Effect Decreased Warfarin Effect  Garlic Ginseng Ginkgo biloba Coenzyme Q10 Green tea St. Johns wort    Coumadin (Warfarin) FOOD Interactions  Eat a consistent number of servings per week of foods HIGH in Vitamin K (1 serving =  cup)  Collards (cooked, or boiled & drained) Kale (cooked, or boiled & drained) Mustard greens (cooked, or boiled & drained) Parsley *serving size only =  cup Spinach (cooked, or boiled & drained) Swiss chard (cooked, or boiled & drained) Turnip greens (cooked, or boiled & drained)  Eat a consistent number of servings per week of foods MEDIUM-HIGH in Vitamin K (1 serving = 1 cup)  Asparagus (cooked, or boiled & drained) Broccoli (cooked, boiled & drained, or raw & chopped) Brussel sprouts (cooked, or boiled & drained) *serving size only =  cup Lettuce, raw (green leaf, endive, romaine) Spinach, raw Turnip greens, raw & chopped   These websites have more information on Coumadin (warfarin):  Fail[employer].se; VeganReport.com.au;

## 2018-01-06 DIAGNOSIS — Z95810 Presence of automatic (implantable) cardiac defibrillator: Secondary | ICD-10-CM

## 2018-01-06 LAB — BASIC METABOLIC PANEL
Anion gap: 10 (ref 5–15)
BUN: 63 mg/dL — AB (ref 8–23)
CO2: 21 mmol/L — ABNORMAL LOW (ref 22–32)
Calcium: 9.2 mg/dL (ref 8.9–10.3)
Chloride: 107 mmol/L (ref 98–111)
Creatinine, Ser: 3.63 mg/dL — ABNORMAL HIGH (ref 0.44–1.00)
GFR calc non Af Amer: 11 mL/min — ABNORMAL LOW (ref >60)
GFR, EST AFRICAN AMERICAN: 13 mL/min — AB (ref >60)
Glucose, Bld: 154 mg/dL — ABNORMAL HIGH (ref 70–99)
Potassium: 4.2 mmol/L (ref 3.5–5.1)
Sodium: 138 mmol/L (ref 135–145)

## 2018-01-06 LAB — PROTIME-INR
INR: 1.44
Prothrombin Time: 17.4 seconds — ABNORMAL HIGH (ref 11.4–15.2)

## 2018-01-06 LAB — CBC
HEMATOCRIT: 39.1 % (ref 36.0–46.0)
HEMOGLOBIN: 12.7 g/dL (ref 12.0–15.0)
MCH: 33.9 pg (ref 26.0–34.0)
MCHC: 32.5 g/dL (ref 30.0–36.0)
MCV: 104.3 fL — ABNORMAL HIGH (ref 78.0–100.0)
Platelets: 80 10*3/uL — ABNORMAL LOW (ref 150–400)
RBC: 3.75 MIL/uL — ABNORMAL LOW (ref 3.87–5.11)
RDW: 11.7 % (ref 11.5–15.5)
WBC: 7.4 10*3/uL (ref 4.0–10.5)

## 2018-01-06 MED ORDER — WARFARIN SODIUM 4 MG PO TABS
4.0000 mg | ORAL_TABLET | Freq: Once | ORAL | Status: AC
  Administered 2018-01-06: 4 mg via ORAL
  Filled 2018-01-06: qty 1

## 2018-01-06 NOTE — Progress Notes (Signed)
PROGRESS NOTE  Casey [name] HWE:[id number] DOB: 10/16/1934 DOA: 01/01/2018 PCP: Casey Showers, MD  HPI/Recap of past 24 hours: Casey [name] a 83 y.o.femalewithhistory of chronic systolic heart failure status post defibrillator placement and pacemaker, atrial fibrillation, chronic kidney disease stage IV, hypertension, gout who has had recent acute gouty attacks of left ankle and is on colchicine and has been having increasing bowel movements and had gone to the bathroom and on the way to the room patient felt dizzy and fell. Patient states she got entangled on a cloth on the way back. Denies losing consciousness hormone having any chest pain or palpitation. She hurt her left ankle and was brought to the ER.  01/03/2018: Patient seen and examined her bedside.  She has no new complaints, pain is well controlled.  Left ankle surgery postponned due to elevated INR.  01/04/18: Hypotensive in the setting of significant heart disease with chronic systolic CHF with EF of 89%.  Cardiology consulted for preop cardiovascular clearance.  Patient denies chest pain, dyspnea or palpitations.  01/05/18: States she has no pain in her left ankle.  POD #1 post left ankle bimalleolar fracture fixation.  Denies chest pain, dyspnea or palpitation.    01/06/18: No new complaints. States pain is well controlled when she takes her pain medications. No nausea. No chest pain or dyspnea.     Assessment/Plan: Principal Problem:   Closed left ankle fracture Active Problems:   Biventricular implantable cardioverter-defibrillator in situ   Nonischemic cardiomyopathy (HCC)   Paroxysmal atrial fibrillation (HCC)   Gout   Hypothyroidism   Kidney disease, chronic, stage III (GFR 30-59 ml/min) (HCC)   Ventricular septal defect (VSD)   Ankle fracture   Closed displaced bimalleolar fracture of left lower leg  POD #2 post left ankle bimalleolar fracture fixation Status post mechanical fall Left  ankle repair 01/04/2018 Orthopedic surgery following Continue pain management Continue bowel regimen  Worsening AKI on CKD 4 Cr 3.63 from 2.75 Baseline cr 2.4 w GFR 24 Avoid nephrotoxic agents Repeat BMP in the am  Resolved Hypotension suspect iatrogenic Cardiology consulted to assist with adjustment of cardiac medications Doses of cardiac medications reduced per cardiology Continue to closely monitor vital signs Maintain map >38  Chronic systolic CHF with EF of 10% Caution with IV fluid Continue current medications as recommended by cardiology Strict I's and O's Daily weight Low-sodium diet less than 2 g/day  MR/VSD Cardiology following  Persistent Subtherapeutic INR Pharmacy managing INR Coumadin resumed  Paroxysmal A. fib Rate controlled  Gout Appears stable at this time  Hypothyroidism Continue Synthroid   Code Status: Full code  Family Communication: None at bedside  Disposition Plan: Home in 1 to 2 days when orthopedic surgery signs off   Consultants:  Orthopedic surgery  Procedures:  Left ankle repair on 01/04/2018  Antimicrobials:  None  DVT prophylaxis: Coumadin on hold due to planned procedure   Objective: Vitals:   01/06/18 1225 01/06/18 1228 01/06/18 1505 01/06/18 1505  BP:  (!) 100/51 (!) 91/52 (!) 91/52  Pulse: 62  93 87  Resp:    18  Temp:    98.1 F (36.7 C)  TempSrc:    Oral  SpO2:    98%  Weight:      Height:        Intake/Output Summary (Last 24 hours) at 01/06/2018 1557 Last data filed at 01/06/2018 0700 Gross per 24 hour  Intake 240 ml  Output 400 ml  Net -160 ml  Filed Weights   01/01/18 1944 01/04/18 1802  Weight: 72.6 kg (160 lb) 72.6 kg (160 lb)    Exam:  . General: 83 y.o. year-old female WD WN NAD A&O x 3 .  Cardiovascular: RRR no rubs or gallops. No JVD or thyromegaly. Marland Kitchen Ultrasound respiratory: Clear to auscultation with no wheezes or rales. Good inspiratory effort. . Abdomen: Soft nontender  nondistended with normal bowel sounds x4 quadrants. . Musculoskeletal: Left ankle in surgical cast . Psychiatry: Mood is appropriate for condition and setting   Data Reviewed: CBC: Recent Labs  Lab 01/01/18 2004 01/02/18 0323 01/03/18 0415 01/05/18 0242 01/06/18 0408  WBC 5.7 7.4 6.5 8.4 7.4  NEUTROABS 3.7  --   --   --   --   HGB 14.1 14.4 13.7 13.8 12.7  HCT 44.6 44.7 42.0 43.8 39.1  MCV 107.0* 104.2* 102.7* 106.8* 104.3*  PLT 118* 118* 107* 96* 80*   Basic Metabolic Panel: Recent Labs  Lab 01/01/18 2004 01/02/18 0323 01/03/18 0415 01/06/18 0408  NA 144 143 146* 138  K 4.2 4.3 3.9 4.2  CL 113* 112* 113* 107  CO2 21* 21* 24 21*  GLUCOSE 126* 127* 120* 154*  BUN 54* 51* 47* 63*  CREATININE 2.93* 2.58* 2.75* 3.63*  CALCIUM 9.5 9.4 9.8 9.2   GFR: Estimated Creatinine Clearance: 11.3 mL/min (A) (by C-G formula based on SCr of 3.63 mg/dL (H)). Liver Function Tests: No results for input(s): AST, ALT, ALKPHOS, BILITOT, PROT, ALBUMIN in the last 168 hours. No results for input(s): LIPASE, AMYLASE in the last 168 hours. No results for input(s): AMMONIA in the last 168 hours. Coagulation Profile: Recent Labs  Lab 01/01/18 2004 01/03/18 0415 01/04/18 0408 01/05/18 0242 01/06/18 0408  INR 2.22 2.42 1.54 1.27 1.44   Cardiac Enzymes: No results for input(s): CKTOTAL, CKMB, CKMBINDEX, TROPONINI in the last 168 hours. BNP (last 3 results) No results for input(s): PROBNP in the last 8760 hours. HbA1C: No results for input(s): HGBA1C in the last 72 hours. CBG: No results for input(s): GLUCAP in the last 168 hours. Lipid Profile: No results for input(s): CHOL, HDL, LDLCALC, TRIG, CHOLHDL, LDLDIRECT in the last 72 hours. Thyroid Function Tests: No results for input(s): TSH, T4TOTAL, FREET4, T3FREE, THYROIDAB in the last 72 hours. Anemia Panel: No results for input(s): VITAMINB12, FOLATE, FERRITIN, TIBC, IRON, RETICCTPCT in the last 72 hours. Urine analysis:      Component Value Date/Time   COLORURINE YELLOW 12/17/2007 1501   APPEARANCEUR CLEAR 12/17/2007 1501   LABSPEC 1.014 12/17/2007 1501   PHURINE 5.5 12/17/2007 1501   GLUCOSEU NEGATIVE 12/17/2007 1501   HGBUR NEGATIVE 12/17/2007 1501   BILIRUBINUR neg 06/10/2015 1019   KETONESUR NEGATIVE 12/17/2007 1501   PROTEINUR neg 06/10/2015 1019   PROTEINUR NEGATIVE 12/17/2007 1501   UROBILINOGEN 0.2 06/10/2015 1019   UROBILINOGEN 1.0 12/17/2007 1501   NITRITE neg 06/10/2015 1019   NITRITE NEGATIVE 12/17/2007 1501   LEUKOCYTESUR Negative 06/10/2015 1019   Sepsis Labs: @LABRCNTIP (procalcitonin:4,lacticidven:4)  ) Recent Results (from the past 240 hour(s))  Surgical pcr screen     Status: Abnormal   Collection Time: 01/02/18  9:20 PM  Result Value Ref Range Status   MRSA, PCR NEGATIVE NEGATIVE Final   Staphylococcus aureus POSITIVE (A) NEGATIVE Final    Comment: (NOTE) The Xpert SA Assay (FDA approved for NASAL specimens in patients 82 years of age and older), is one component of a comprehensive surveillance program. It is not intended to diagnose infection nor to  guide or monitor treatment.       Studies: No results found.  Scheduled Meds: . amiodarone  200 mg Oral Daily  . docusate sodium  100 mg Oral BID  . dorzolamide  1 drop Both Eyes TID  . hydrALAZINE  37.5 mg Oral Q8H  . isosorbide mononitrate  30 mg Oral Daily  . latanoprost  1 drop Both Eyes QHS  . levothyroxine  75 mcg Oral QAC breakfast  . metoprolol succinate  50 mg Oral Daily  . multivitamin with minerals  1 tablet Oral Daily  . potassium chloride SA  20 mEq Oral Daily  . torsemide  60 mg Oral Daily  . traMADol  50 mg Oral Q12H  . warfarin  4 mg Oral ONCE-1800  . Warfarin - Pharmacist Dosing Inpatient   Does not apply q1800    Continuous Infusions: . methocarbamol (ROBAXIN) IV       LOS: 4 days     Kayleen Memos, MD [facility] Pager [phone]  If 7PM-7AM, please contact  night-coverage [email or web address] Password [facility] 01/06/2018, 3:57 PM

## 2018-01-06 NOTE — Clinical Social Work Note (Signed)
Clinical Social Work Assessment  Patient Details  Name: Casey [name] MRN: [id number] Date of Birth: 04-20-1935  Date of referral:  01/06/18               Reason for consult:  Facility Placement                Permission sought to share information with:  Family Supports Permission granted to share information::     Name::     Casey [name]  Agency::  [facility]  Relationship::  daughter  Contact Information:  [phone]  Housing/Transportation Living arrangements for the past 2 months:  [place] of Information:  Patient Patient Interpreter Needed:  None, Sign Language Criminal Activity/Legal Involvement Pertinent to Current Situation/Hospitalization:    Significant Relationships:  Adult Children Lives with:  Self Do you feel safe going back to the place where you live?  No Need for family participation in patient care:  No (Coment)  Care giving concerns:  Pt is alert and oriented. Pt lives home alone. Pt was living with spouse however right now he is at [facility] for rehab.   Social Worker assessment / plan:  CSW spoke with pt at bedside. Pt is agreeable to SNF and wants to go to [place] as her husband is there. CSW to follow up with facility.  Employment status:  Retired Forensic scientist:  Medicare PT Recommendations:  [place] / Referral to community resources:  [place]  Patient/Family's Response to care:  Pt verbalized understanding of CSW role and expressed appreciation for support. Pt denies any concern regarding pt care at this time.   Patient/Family's Understanding of and Emotional Response to Diagnosis, Current Treatment, and Prognosis:  Pt understanding and realistic regarding physical limitations. Pt understands the need for SNF placement at d/c. Pt agreeable to SNF placement at d/c, at this time. Pt's responses emotionally appropriate during conversation with CSW. Pt denies any concern regarding  treatment plan at this time. CSW will continue to provide support and facilitate d/c needs.   Emotional Assessment Appearance:  Appears stated age Attitude/Demeanor/Rapport:  (Patient was appropriate) Affect (typically observed):  Accepting, Appropriate, Calm Orientation:  Oriented to Self, Oriented to Place, Oriented to  Time, Oriented to Situation Alcohol / Substance use:  Not Applicable Psych involvement (Current and /or in the community):  No (Comment)  Discharge Needs  Concerns to be addressed:  Basic Needs, Care Coordination Readmission within the last 30 days:  No Current discharge risk:  Dependent with Mobility Barriers to Discharge:  Continued Medical Work up   [employer], LCSW 01/06/2018, 11:09 AM

## 2018-01-06 NOTE — Progress Notes (Signed)
ANTICOAGULATION CONSULT NOTE - Follow Up Consult  Pharmacy Consult for Coumadin Indication: atrial fibrillation  Allergies  Allergen Reactions  . Codeine Rash    Patient Measurements: Height: 5' 2.01" (157.5 cm) Weight: 160 lb (72.6 kg) IBW/kg (Calculated) : 50.12  Vital Signs: Temp: 98.3 F (36.8 C) (07/28 0624) Temp Source: Oral (07/28 0624) BP: 93/51 (07/28 0624) Pulse Rate: 93 (07/28 0624)  Labs: Recent Labs    01/04/18 0408 01/05/18 0242 01/06/18 0408  HGB  --  13.8 12.7  HCT  --  43.8 39.1  PLT  --  96* 80*  LABPROT 18.3* 15.8* 17.4*  INR 1.54 1.27 1.44  CREATININE  --   --  3.63*    Estimated Creatinine Clearance: 11.3 mL/min (A) (by C-G formula based on SCr of 3.63 mg/dL (H)).   Assessment:  Anticoag: Afib on Coumadin PTA. Vitamin K 5 mg PO 7/25. INR 1.27>1.44. Hgb 13.8>12.7. Plts down to 80 (no bridging hep or LMWH) - PTA Coumadin 2mg  daily with admit INR 2.42 Goal of Therapy:  INR 2-3 Monitor platelets by anticoagulation protocol: Yes   Plan:  Coumadin 4mg  po x 1 tonight Daily INR  Casey [name], PharmD, BCPS Clinical Staff Pharmacist Pager [phone]  Casey [name] 01/06/2018,10:42 AM

## 2018-01-07 LAB — CBC
HCT: 38.9 % (ref 36.0–46.0)
HEMOGLOBIN: 12.4 g/dL (ref 12.0–15.0)
MCH: 33.6 pg (ref 26.0–34.0)
MCHC: 31.9 g/dL (ref 30.0–36.0)
MCV: 105.4 fL — ABNORMAL HIGH (ref 78.0–100.0)
Platelets: 81 10*3/uL — ABNORMAL LOW (ref 150–400)
RBC: 3.69 MIL/uL — ABNORMAL LOW (ref 3.87–5.11)
RDW: 11.9 % (ref 11.5–15.5)
WBC: 6.8 10*3/uL (ref 4.0–10.5)

## 2018-01-07 LAB — BASIC METABOLIC PANEL
Anion gap: 12 (ref 5–15)
BUN: 60 mg/dL — ABNORMAL HIGH (ref 8–23)
CALCIUM: 9.6 mg/dL (ref 8.9–10.3)
CO2: 24 mmol/L (ref 22–32)
CREATININE: 3.11 mg/dL — AB (ref 0.44–1.00)
Chloride: 103 mmol/L (ref 98–111)
GFR calc non Af Amer: 13 mL/min — ABNORMAL LOW (ref >60)
GFR, EST AFRICAN AMERICAN: 15 mL/min — AB (ref >60)
Glucose, Bld: 99 mg/dL (ref 70–99)
Potassium: 4.5 mmol/L (ref 3.5–5.1)
SODIUM: 139 mmol/L (ref 135–145)

## 2018-01-07 LAB — PROTIME-INR
INR: 1.58
PROTHROMBIN TIME: 18.7 s — AB (ref 11.4–15.2)

## 2018-01-07 MED ORDER — WARFARIN SODIUM 5 MG PO TABS
5.0000 mg | ORAL_TABLET | Freq: Once | ORAL | Status: AC
  Administered 2018-01-07: 5 mg via ORAL
  Filled 2018-01-07: qty 1

## 2018-01-07 MED ORDER — HYDRALAZINE HCL 25 MG PO TABS
25.0000 mg | ORAL_TABLET | Freq: Three times a day (TID) | ORAL | Status: DC
  Filled 2018-01-07 (×4): qty 1

## 2018-01-07 NOTE — Progress Notes (Signed)
ANTICOAGULATION CONSULT NOTE - Follow Up Consult  Pharmacy Consult for Coumadin Indication: atrial fibrillation  Allergies  Allergen Reactions  . Codeine Rash    Patient Measurements: Height: 5' 2.01" (157.5 cm) Weight: 166 lb 0.1 oz (75.3 kg) IBW/kg (Calculated) : 50.12  Vital Signs: Temp: 97.9 F (36.6 C) (07/29 0902) Temp Source: Oral (07/29 0902) BP: 97/61 (07/29 0902) Pulse Rate: 75 (07/29 0902)  Labs: Recent Labs    01/05/18 0242 01/06/18 0408 01/07/18 0555  HGB 13.8 12.7 12.4  HCT 43.8 39.1 38.9  PLT 96* 80* 81*  LABPROT 15.8* 17.4* 18.7*  INR 1.27 1.44 1.58  CREATININE  --  3.63* 3.11*    Estimated Creatinine Clearance: 13.5 mL/min (A) (by C-G formula based on SCr of 3.11 mg/dL (H)).   Assessment: CC/HPI: fall  PMH: CHF, ICD, PPM, Afib, CKD4, HTN, gout,  Anticoag: Afib on Coumadin PTA. Vitamin K 5 mg PO 7/25. INR 1.58 (no bridging hep or LMWH) - PTA Coumadin 2mg  daily with admit INR 2.42  Renal: Scr 3.11  Heme/Onc: H&H 12.4/38.9, Plt 81  Goal of Therapy:  INR 2-3 Monitor platelets by anticoagulation protocol: Yes   Plan:  Coumadin 5 mg po x 1 tonight Daily INR  Levester Fresh, PharmD, BCPS, BCCCP Clinical Pharmacist [phone]  Please check AMION for all [place] numbers  01/07/2018 12:27 PM

## 2018-01-07 NOTE — Progress Notes (Addendum)
PROGRESS NOTE  Casey [name] YCX:[id number] DOB: 1934/12/02 DOA: 01/01/2018 PCP: Elby Showers, MD  HPI/Recap of past 24 hours: Casey Bourn McCormickis a 83 y.o.femalewithhistory of chronic systolic heart failure status post defibrillator placement and pacemaker, atrial fibrillation, chronic kidney disease stage IV, hypertension, gout who has had recent acute gouty attacks of left ankle and is on colchicine and has been having increasing bowel movements and had gone to the bathroom and on the way to the room patient felt dizzy and fell. Patient states she got entangled on a cloth on the way back. Denies losing consciousness hormone having any chest pain or palpitation. She hurt her left ankle and was brought to the ER.  01/03/2018: Patient seen and examined her bedside.  She has no new complaints, pain is well controlled.  Left ankle surgery postponned due to elevated INR.  01/04/18: Hypotensive in the setting of significant heart disease with chronic systolic CHF with EF of 49%.  Cardiology consulted for preop cardiovascular clearance.  Patient denies chest pain, dyspnea or palpitations.  01/05/18: States she has no pain in her left ankle.  POD #1 post left ankle bimalleolar fracture fixation.  Denies chest pain, dyspnea or palpitation.    01/06/18: No new complaints. States pain is well controlled when she takes her pain medications. No nausea. No chest pain or dyspnea.  01/07/18: In good spirits. States pain is well controlled. Working with PT and progressing slowly.     Assessment/Plan: Principal Problem:   Closed left ankle fracture Active Problems:   Biventricular implantable cardioverter-defibrillator in situ   Nonischemic cardiomyopathy (HCC)   Paroxysmal atrial fibrillation (HCC)   Gout   Hypothyroidism   Kidney disease, chronic, stage III (GFR 30-59 ml/min) (HCC)   Ventricular septal defect (VSD)   Ankle fracture   Closed displaced bimalleolar fracture of left  lower leg  POD #3 post left ankle bimalleolar fracture fixation Status post mechanical fall Left ankle repair 01/04/2018 Orthopedic surgery following Continue pain management Continue bowel regimen  Improving AKI on CKD 4 Cr 3.11 from 3.63 from 2.75 Baseline cr 2.4 w GFR 24 Avoid nephrotoxic agents/hypotension Repeat BMP in the am  Persistent Hypotension Cardiology adjusting cardiac meds Maintain MAP >65 if possible Avoid IV fluid Cardiology consulted to assist with adjustment of cardiac medications  Chronic systolic CHF with EF of 70% Caution with IV fluid Continue current medications as recommended by cardiology Strict I's and O's Daily weight Low-sodium diet less than 2 g/day  MR/VSD Cardiology following  Persistent Subtherapeutic INR Pharmacy managing INR Coumadin resumed  Paroxysmal A. fib Rate controlled C/w coumadin  Gout Appears stable at this time  Hypothyroidism Continue Synthroid   Code Status: Full code  Family Communication: None at bedside  Disposition Plan: Home in 1 to 2 days when orthopedic surgery signs off   Consultants:  Orthopedic surgery  cardiology  Procedures:  Left ankle repair on 01/04/2018  Antimicrobials:  None  DVT prophylaxis: Coumadin   Objective: Vitals:   01/07/18 0536 01/07/18 0902 01/07/18 1315 01/07/18 1437  BP: (!) 96/51 97/61 104/62 (!) 96/54  Pulse: 72 75 66   Resp:  18 18   Temp: 98.4 F (36.9 C) 97.9 F (36.6 C) 98.5 F (36.9 C)   TempSrc: Oral Oral Oral   SpO2: 100% 96% 93%   Weight: 75.3 kg (166 lb 0.1 oz)     Height:        Intake/Output Summary (Last 24 hours) at 01/07/2018 1709 Last data  filed at 01/07/2018 1500 Gross per 24 hour  Intake 490 ml  Output 900 ml  Net -410 ml   Filed Weights   01/01/18 1944 01/04/18 1802 01/07/18 0536  Weight: 72.6 kg (160 lb) 72.6 kg (160 lb) 75.3 kg (166 lb 0.1 oz)    Exam:  . General: 83 y.o. year-old female WD wN NAD A&O x 3 .   Cardiovascular: RRR no rubs or gallops. No jVD or thyromegaly. Marland Kitchen Ultrasound respiratory: Clear to auscultation with no wheezes or rales. Good inspiratory effort. . Abdomen: Soft nontender nondistended with normal bowel sounds x4 quadrants. . Musculoskeletal: Left ankle in surgical cast . Psychiatry: Mood is appropriate for condition and setting   Data Reviewed: CBC: Recent Labs  Lab 01/01/18 2004 01/02/18 0323 01/03/18 0415 01/05/18 0242 01/06/18 0408 01/07/18 0555  WBC 5.7 7.4 6.5 8.4 7.4 6.8  NEUTROABS 3.7  --   --   --   --   --   HGB 14.1 14.4 13.7 13.8 12.7 12.4  HCT 44.6 44.7 42.0 43.8 39.1 38.9  MCV 107.0* 104.2* 102.7* 106.8* 104.3* 105.4*  PLT 118* 118* 107* 96* 80* 81*   Basic Metabolic Panel: Recent Labs  Lab 01/01/18 2004 01/02/18 0323 01/03/18 0415 01/06/18 0408 01/07/18 0555  NA 144 143 146* 138 139  K 4.2 4.3 3.9 4.2 4.5  CL 113* 112* 113* 107 103  CO2 21* 21* 24 21* 24  GLUCOSE 126* 127* 120* 154* 99  BUN 54* 51* 47* 63* 60*  CREATININE 2.93* 2.58* 2.75* 3.63* 3.11*  CALCIUM 9.5 9.4 9.8 9.2 9.6   GFR: Estimated Creatinine Clearance: 13.5 mL/min (A) (by C-G formula based on SCr of 3.11 mg/dL (H)). Liver Function Tests: No results for input(s): AST, ALT, ALKPHOS, BILITOT, PROT, ALBUMIN in the last 168 hours. No results for input(s): LIPASE, AMYLASE in the last 168 hours. No results for input(s): AMMONIA in the last 168 hours. Coagulation Profile: Recent Labs  Lab 01/03/18 0415 01/04/18 0408 01/05/18 0242 01/06/18 0408 01/07/18 0555  INR 2.42 1.54 1.27 1.44 1.58   Cardiac Enzymes: No results for input(s): CKTOTAL, CKMB, CKMBINDEX, TROPONINI in the last 168 hours. BNP (last 3 results) No results for input(s): PROBNP in the last 8760 hours. HbA1C: No results for input(s): HGBA1C in the last 72 hours. CBG: No results for input(s): GLUCAP in the last 168 hours. Lipid Profile: No results for input(s): CHOL, HDL, LDLCALC, TRIG, CHOLHDL,  LDLDIRECT in the last 72 hours. Thyroid Function Tests: No results for input(s): TSH, T4TOTAL, FREET4, T3FREE, THYROIDAB in the last 72 hours. Anemia Panel: No results for input(s): VITAMINB12, FOLATE, FERRITIN, TIBC, IRON, RETICCTPCT in the last 72 hours. Urine analysis:    Component Value Date/Time   COLORURINE YELLOW 12/17/2007 1501   APPEARANCEUR CLEAR 12/17/2007 1501   LABSPEC 1.014 12/17/2007 1501   PHURINE 5.5 12/17/2007 1501   GLUCOSEU NEGATIVE 12/17/2007 1501   HGBUR NEGATIVE 12/17/2007 1501   BILIRUBINUR neg 06/10/2015 1019   KETONESUR NEGATIVE 12/17/2007 1501   PROTEINUR neg 06/10/2015 1019   PROTEINUR NEGATIVE 12/17/2007 1501   UROBILINOGEN 0.2 06/10/2015 1019   UROBILINOGEN 1.0 12/17/2007 1501   NITRITE neg 06/10/2015 1019   NITRITE NEGATIVE 12/17/2007 1501   LEUKOCYTESUR Negative 06/10/2015 1019   Sepsis Labs: @LABRCNTIP (procalcitonin:4,lacticidven:4)  ) Recent Results (from the past 240 hour(s))  Surgical pcr screen     Status: Abnormal   Collection Time: 01/02/18  9:20 PM  Result Value Ref Range Status   MRSA, PCR NEGATIVE  NEGATIVE Final   Staphylococcus aureus POSITIVE (A) NEGATIVE Final    Comment: (NOTE) The Xpert SA Assay (FDA approved for NASAL specimens in patients 85 years of age and older), is one component of a comprehensive surveillance program. It is not intended to diagnose infection nor to guide or monitor treatment.       Studies: No results found.  Scheduled Meds: . amiodarone  200 mg Oral Daily  . docusate sodium  100 mg Oral BID  . dorzolamide  1 drop Both Eyes TID  . hydrALAZINE  25 mg Oral Q8H  . isosorbide mononitrate  30 mg Oral Daily  . latanoprost  1 drop Both Eyes QHS  . levothyroxine  75 mcg Oral QAC breakfast  . metoprolol succinate  50 mg Oral Daily  . multivitamin with minerals  1 tablet Oral Daily  . potassium chloride SA  20 mEq Oral Daily  . torsemide  60 mg Oral Daily  . traMADol  50 mg Oral Q12H  . warfarin  5  mg Oral ONCE-1800  . Warfarin - Pharmacist Dosing Inpatient   Does not apply q1800    Continuous Infusions: . methocarbamol (ROBAXIN) IV       LOS: 5 days     Kayleen Memos, MD [facility] Pager [phone]  If 7PM-7AM, please contact night-coverage [email or web address] Password [email or web address] 01/07/2018, 5:09 PM

## 2018-01-07 NOTE — Progress Notes (Addendum)
Advanced Heart Failure Rounding Note  PCP-Cardiologist: Ezzard Standing, MD   Subjective:    S/p left ankle bimalleolar fracture fixation 01/04/18  BPs soft but stable on all of her home meds.   Feeling OK this am. She states she stood on her ankle yesterday, but wasn't sure if she was supposed to. Denies lightheadedness or dizziness. States her BPs always run on the low side.   Cr stable near baseline of ~ 3.0. Hgb 12.4. Weight up 6 lbs from pre-op.  SBP upper 80s - low 100s.  Objective:   Weight Range: 166 lb 0.1 oz (75.3 kg) Body mass index is 30.36 kg/m.   Vital Signs:   Temp:  [97.7 F (36.5 C)-98.4 F (36.9 C)] 98.4 F (36.9 C) (07/29 0536) Pulse Rate:  [60-93] 72 (07/29 0536) Resp:  [18] 18 (07/28 1505) BP: (88-100)/(51-65) 96/51 (07/29 0536) SpO2:  [95 %-100 %] 100 % (07/29 0536) Weight:  [166 lb 0.1 oz (75.3 kg)] 166 lb 0.1 oz (75.3 kg) (07/29 0536) Last BM Date: 01/04/18  Weight change: Filed Weights   01/01/18 1944 01/04/18 1802 01/07/18 0536  Weight: 160 lb (72.6 kg) 160 lb (72.6 kg) 166 lb 0.1 oz (75.3 kg)    Intake/Output:   Intake/Output Summary (Last 24 hours) at 01/07/2018 0833 Last data filed at 01/07/2018 0700 Gross per 24 hour  Intake 490 ml  Output 400 ml  Net 90 ml      Physical Exam    General: Well appearing. No resp difficulty. HEENT: Normal Neck: Supple. JVP 6-7. Carotids 2+ bilat; no bruits. No thyromegaly or nodule noted. Cor: PMI nondisplaced. RRR, No M/G/R noted Lungs: CTAB, normal effort. Abdomen: Soft, non-tender, non-distended, no HSM. No bruits or masses. +BS  Extremities: No cyanosis, clubbing, or rash. R and LLE no edema.  Neuro: Alert & orientedx3, cranial nerves grossly intact. moves all 4 extremities w/o difficulty. Affect pleasant   Telemetry   NSR 70s, personally reviewed.   EKG    No new tracings.    Labs    CBC Recent Labs    01/06/18 0408 01/07/18 0555  WBC 7.4 6.8  HGB 12.7 12.4  HCT 39.1  38.9  MCV 104.3* 105.4*  PLT 80* 81*   Basic Metabolic Panel Recent Labs    01/06/18 0408 01/07/18 0555  NA 138 139  K 4.2 4.5  CL 107 103  CO2 21* 24  GLUCOSE 154* 99  BUN 63* 60*  CREATININE 3.63* 3.11*  CALCIUM 9.2 9.6   Liver Function Tests No results for input(s): AST, ALT, ALKPHOS, BILITOT, PROT, ALBUMIN in the last 72 hours. No results for input(s): LIPASE, AMYLASE in the last 72 hours. Cardiac Enzymes No results for input(s): CKTOTAL, CKMB, CKMBINDEX, TROPONINI in the last 72 hours.  BNP: BNP (last 3 results) Recent Labs    07/06/17 1900  BNP 4,340.1*    ProBNP (last 3 results) No results for input(s): PROBNP in the last 8760 hours.   D-Dimer No results for input(s): DDIMER in the last 72 hours. Hemoglobin A1C No results for input(s): HGBA1C in the last 72 hours. Fasting Lipid Panel No results for input(s): CHOL, HDL, LDLCALC, TRIG, CHOLHDL, LDLDIRECT in the last 72 hours. Thyroid Function Tests No results for input(s): TSH, T4TOTAL, T3FREE, THYROIDAB in the last 72 hours.  Invalid input(s): FREET3  Other results:   Imaging     No results found.   Medications:     Scheduled Medications: . amiodarone  200  mg Oral Daily  . docusate sodium  100 mg Oral BID  . dorzolamide  1 drop Both Eyes TID  . hydrALAZINE  37.5 mg Oral Q8H  . isosorbide mononitrate  30 mg Oral Daily  . latanoprost  1 drop Both Eyes QHS  . levothyroxine  75 mcg Oral QAC breakfast  . metoprolol succinate  50 mg Oral Daily  . multivitamin with minerals  1 tablet Oral Daily  . potassium chloride SA  20 mEq Oral Daily  . torsemide  60 mg Oral Daily  . traMADol  50 mg Oral Q12H  . Warfarin - Pharmacist Dosing Inpatient   Does not apply q1800     Infusions: . methocarbamol (ROBAXIN) IV       PRN Medications:  acetaminophen, fentaNYL (SUBLIMAZE) injection, HYDROcodone-acetaminophen, methocarbamol **OR** methocarbamol (ROBAXIN) IV, metoCLOPramide **OR** metoCLOPramide  (REGLAN) injection, ondansetron **OR** ondansetron (ZOFRAN) IV    Patient Profile   Casey [name] is a 83 y.o. female  with a hx of NICM, CKD III-IV, nl cors 10/15/2017 cath, MDT CRT-D, no ACEI/ARB/ARNI due to CKD (tried all in the past), solitary kidney, PAF on coumadin, EF 15-20% 10/31/2017 w/ mod central MR by TEE (not a candidate for mitraclip),small perimembranous VSD, who was seen for the preop evaluation of ankle surgery at the request of Dr Marlou Sa.   Assessment/Plan   1. Mechanical fall with left ankle fracture - s/p left ankle bimalleolar fracture fixation 01/04/18 2. Chronic systolic CHF: Long-standing NICM, 5/05 cath without significant CAD. Echo in 5/17 with EF 20%, echo in 1/19 with EF 10-15%, prominent dyssynchrony, moderate-severe MR, PASP 91 (but ?if this represents VSD flow rather than TR). Complicated by CKD stage 4. Has MDT CRT-D device. In 1/19, she underwent V-V optimization per EP.  TEE in 5/19 showed moderate MR, EF 15-20%.   - She is stable back on her home medications.  - Continue torsemide 60 mg daily.  Cr stable near baseline.   - Continue Toprol XL 50 mg daily.   - No ACEI/ARB/ARNI: has failed in the past with AKI.  - Continue hydralazine 37.5 mg tid + Imdur 30 daily. Can cut her hydralazine back as needed if BP too soft.  - Most recent creatinine contraindication for spironolactone.  3. Atrial fibrillation:  - Paroxysmal. Remains in NSR.  - Continue amiodarone. LFTs/TSH WNL 12/21/17. She will need regular eye exams.  - Continue warfarin.  4. CKD: Stage 4 - Stable.  5. H/o VSD:  - Small peri-membranous VSD on 5/19 TEE.  No change.  6. Mitral regurgitation: - TEE 5/19 with moderate functional MR.  Not candidate for Mitraclip.    Medication concerns reviewed with patient and pharmacy team. Barriers identified: None at this time.   Length of Stay: 5  Annamaria Helling  01/07/2018, 8:33 AM  Advanced Heart Failure Team Pager [phone] (M-F;  7a - 4p)  Please contact [facility] for night-coverage after hours (4p -7a ) and weekends on [email or web address]  Patient seen with PA, agree with the above note.  She is stable from a cardiac perspective, volume looks ok.  Creatinine lower today at 3.1, not far off her home range.  Not doing much walking yet due to pain in her foot.   I will decrease her hydralazine to 25 mg tid with relatively soft BP (though probably her baseline).  Other meds will continue the same.   Loralie Champagne 01/07/2018 9:00 AM

## 2018-01-07 NOTE — Progress Notes (Signed)
Physical Therapy Treatment Patient Details Name: Casey [name] MRN: [id number] DOB: March 05, 1935 Today's Date: 01/07/2018    History of Present Illness Patient is an 83 year old fermale S/P left ankle ORIF on 01/04/2018. PMH: tachycardia, septal defect; lumbar ddd; gout, ulcers, CHF, CKD; atheroscleroiss     PT Comments    Pt progressing slowly, has difficulty maintaining NWB status in standing, not really feasible for her to ambulate with NWB gait at this point so working on safe transfers and will need to use w/c as primary mobility until WB status is advanced. PT will continue to follow.   Follow Up Recommendations  SNF     Equipment Recommendations  Rolling walker with 5" wheels    Recommendations for Other Services       Precautions / Restrictions Precautions Precautions: Fall Restrictions Weight Bearing Restrictions: Yes LLE Weight Bearing: Non weight bearing    Mobility  Bed Mobility Overal bed mobility: Needs Assistance Bed Mobility: Supine to Sit     Supine to sit: Supervision     General bed mobility comments: pt able to manage LLE to EOB. Allowed pt to lay L foot gently on floor in sitting which made her much more stable  Transfers Overall transfer level: Needs assistance Equipment used: Rolling walker (2 wheeled) Transfers: Sit to/from Stand Sit to Stand: Min assist Stand pivot transfers: Mod assist       General transfer comment: therapist's foot under pt's R foot to assure NWB. Min A for fwd wt shift and power up. Pt having difficulty hopping on RLE, kept trying to sitbefore she was really at the chair. Mod A for support during hopping. Pt fatigued before getting to chair and was cued to lift R heel and twist foot to continue moving to chair.   Ambulation/Gait             General Gait Details: pt with insufficient strength to hop any distance past chair and keep LLE NWB.    Stairs             Wheelchair Mobility    Modified  Rankin (Stroke Patients Only)       Balance Overall balance assessment: Needs assistance Sitting-balance support: No upper extremity supported Sitting balance-Leahy Scale: Good     Standing balance support: Bilateral upper extremity supported Standing balance-Leahy Scale: Poor Standing balance comment: completely reliant on RW for standing                            Cognition Arousal/Alertness: Awake/alert Behavior During Therapy: WFL for tasks assessed/performed Overall Cognitive Status: Within Functional Limits for tasks assessed                                        Exercises General Exercises - Lower Extremity Ankle Circles/Pumps: AROM;Right;10 reps;Seated Long Arc Quad: AROM;Both;10 reps;Seated    General Comments General comments (skin integrity, edema, etc.): LLE elevated after session      Pertinent Vitals/Pain Pain Assessment: Faces Faces Pain Scale: Hurts a little bit Pain Location: left ankle  Pain Descriptors / Indicators: Throbbing Pain Intervention(s): Limited activity within patient's tolerance;Monitored during session    Home Living                      Prior Function  PT Goals (current goals can now be found in the care plan section) Acute Rehab PT Goals Patient Stated Goal: to walk better  PT Goal Formulation: With patient Time For Goal Achievement: 01/12/18 Potential to Achieve Goals: Good Progress towards PT goals: Progressing toward goals    Frequency    Min 3X/week      PT Plan Frequency needs to be updated;Discharge plan needs to be updated    Co-evaluation              AM-PAC PT "6 Clicks" Daily Activity  Outcome Measure  Difficulty turning over in bed (including adjusting bedclothes, sheets and blankets)?: A Little Difficulty moving from lying on back to sitting on the side of the bed? : A Little Difficulty sitting down on and standing up from a chair with arms (e.g.,  wheelchair, bedside commode, etc,.)?: Unable Help needed moving to and from a bed to chair (including a wheelchair)?: A Lot Help needed walking in hospital room?: Total   6 Click Score: 10    End of Session Equipment Utilized During Treatment: Gait belt Activity Tolerance: Patient tolerated treatment well Patient left: with call bell/phone within reach;in chair;with chair alarm set Nurse Communication: Mobility status PT Visit Diagnosis: Unsteadiness on feet (R26.81);Difficulty in walking, not elsewhere classified (R26.2);Pain Pain - Right/Left: Left Pain - part of body: Ankle and joints of foot     Time: 1208-1227 PT Time Calculation (min) (ACUTE ONLY): 19 min  Charges:  $Therapeutic Activity: 8-22 mins                     [place] 01/07/2018, 1:08 PM

## 2018-01-08 ENCOUNTER — Encounter (HOSPITAL_COMMUNITY): Payer: Self-pay | Admitting: *Deleted

## 2018-01-08 LAB — BASIC METABOLIC PANEL
ANION GAP: 8 (ref 5–15)
BUN: 62 mg/dL — AB (ref 8–23)
CALCIUM: 9.3 mg/dL (ref 8.9–10.3)
CO2: 23 mmol/L (ref 22–32)
Chloride: 106 mmol/L (ref 98–111)
Creatinine, Ser: 3.12 mg/dL — ABNORMAL HIGH (ref 0.44–1.00)
GFR calc Af Amer: 15 mL/min — ABNORMAL LOW (ref >60)
GFR calc non Af Amer: 13 mL/min — ABNORMAL LOW (ref >60)
GLUCOSE: 111 mg/dL — AB (ref 70–99)
POTASSIUM: 4.2 mmol/L (ref 3.5–5.1)
Sodium: 137 mmol/L (ref 135–145)

## 2018-01-08 LAB — PROTIME-INR
INR: 1.75
PROTHROMBIN TIME: 20.3 s — AB (ref 11.4–15.2)

## 2018-01-08 MED ORDER — WARFARIN SODIUM 5 MG PO TABS
5.0000 mg | ORAL_TABLET | Freq: Once | ORAL | Status: AC
  Administered 2018-01-08: 5 mg via ORAL
  Filled 2018-01-08: qty 1

## 2018-01-08 MED ORDER — SENNOSIDES-DOCUSATE SODIUM 8.6-50 MG PO TABS
1.0000 | ORAL_TABLET | Freq: Two times a day (BID) | ORAL | Status: DC
  Administered 2018-01-08 – 2018-01-09 (×2): 1 via ORAL
  Filled 2018-01-08 (×3): qty 1

## 2018-01-08 MED ORDER — POLYETHYLENE GLYCOL 3350 17 G PO PACK
17.0000 g | PACK | Freq: Every day | ORAL | Status: DC
  Administered 2018-01-09: 17 g via ORAL
  Filled 2018-01-08 (×2): qty 1

## 2018-01-08 NOTE — Progress Notes (Signed)
PROGRESS NOTE  Casey [name] BSJ:[id number] DOB: 06-19-1934 DOA: 01/01/2018 PCP: Elby Showers, MD  HPI/Recap of past 24 hours: Casey Morais McCormickis a 83 y.o.femalewithhistory of chronic systolic heart failure status post defibrillator placement and pacemaker, P. atrial fibrillation on coumadin, chronic kidney disease stage IV, hypertension, gout who has had recent acute gouty attacks of left ankle and is on colchicine. Has been having increasing bowel movements and had gone to the bathroom and on the way to the room patient felt dizzy and fell. Patient states she got entangled on a cloth on the way back. Denies losing consciousness or having chest pain or palpitation. Admitted for acute left ankle fracture.  01/03/2018: Left ankle surgery postponned due to elevated INR. 01/04/18: Hypotensive and asymptomatic in the setting of significant heart disease with chronic systolic CHF with EF of 76%.  Cardiology consulted for preop cardiovascular clearance. POD #0 post left ankle bimalleolar fracture fixation.  Heart failure team followed and adjusted medication to prevent hypotension.  01/08/2018: Patient seen and examined at bedside.  Pain is well controlled on current pain management.  She has no new complaints.  She denies chest pain, palpitations, dizziness, dyspnea, or nausea.    Assessment/Plan: Principal Problem:   Closed left ankle fracture Active Problems:   Biventricular implantable cardioverter-defibrillator in situ   Nonischemic cardiomyopathy (HCC)   Paroxysmal atrial fibrillation (HCC)   Gout   Hypothyroidism   Kidney disease, chronic, stage III (GFR 30-59 ml/min) (HCC)   Ventricular septal defect (VSD)   Ankle fracture   Closed displaced bimalleolar fracture of left lower leg  POD #4 post left ankle bimalleolar fracture fixation Status post mechanical fall at home Left ankle repair 01/04/2018 Orthopedic surgery followed and signed off on 01/08/2018 Continue  pain management Continue bowel regimen  AKI on CKD 4 Creatinine 3.12 from 3.11 Baseline cr 2.4 w GFR 24 Avoid nephrotoxic agents/hypotension Repeat BMP in the am  Sub-therapeutic INR INR 1.75 Pharmacy following Continue Coumadin  Hypertension, resolved Cardiology adjusted cardiac meds Maintain MAP >65 if possible Avoid IV fluid Continue Toprol-XL 50 mg daily, torsemide 60 mg daily, hydralazine 25 mg 3 times daily, Imdur 30 mg daily as recommended by cardiology.  Chronic systolic CHF with EF of 54% Caution with IV fluid Continue current medications as recommended by cardiology Strict I's and O's Daily weight Low-sodium diet less than 2 g/day  MR/VSD Cardiology following  Paroxysmal A. fib Rate controlled Continue Toprol-XL C/w coumadin  Gout Appears stable at this time  Hypothyroidism Continue Synthroid   Code Status: Full code  Family Communication: None at bedside  Disposition Plan: SNF possibly tomorrow 01/09/2018 pending SNF bed placement   Consultants:  Orthopedic surgery  Cardiology  Heart failure team  Procedures:  Left ankle repair on 01/04/2018  Antimicrobials:  None  DVT prophylaxis: Coumadin   Objective: Vitals:   01/08/18 0806 01/08/18 1004 01/08/18 1305 01/08/18 1318  BP:  (!) 105/55 (!) 98/55 (!) 95/52  Pulse:  72 73 76  Resp:      Temp:      TempSrc:      SpO2:   98%   Weight: 75.7 kg (166 lb 14.2 oz)     Height:        Intake/Output Summary (Last 24 hours) at 01/08/2018 1814 Last data filed at 01/08/2018 1500 Gross per 24 hour  Intake 840 ml  Output 600 ml  Net 240 ml   Filed Weights   01/04/18 1802 01/07/18 0536 01/08/18 6503  Weight: 72.6 kg (160 lb) 75.3 kg (166 lb 0.1 oz) 75.7 kg (166 lb 14.2 oz)    Exam:  . General: 83 y.o. year-old female well-developed well-nourished in no acute distress.  Alert and oriented x3. .  Cardiovascular: Regular rate and rhythm with no rubs or gallops.  No JVD or thyromegaly  noted. Marland Kitchen Ultrasound respiratory: Clear to auscultation with no wheezes or rales. Good inspiratory effort. . Abdomen: Soft nontender nondistended with normal bowel sounds x4 quadrants. . Musculoskeletal: Left ankle in surgical cast . Psychiatry: Mood is appropriate for condition and setting   Data Reviewed: CBC: Recent Labs  Lab 01/01/18 2004 01/02/18 0323 01/03/18 0415 01/05/18 0242 01/06/18 0408 01/07/18 0555  WBC 5.7 7.4 6.5 8.4 7.4 6.8  NEUTROABS 3.7  --   --   --   --   --   HGB 14.1 14.4 13.7 13.8 12.7 12.4  HCT 44.6 44.7 42.0 43.8 39.1 38.9  MCV 107.0* 104.2* 102.7* 106.8* 104.3* 105.4*  PLT 118* 118* 107* 96* 80* 81*   Basic Metabolic Panel: Recent Labs  Lab 01/02/18 0323 01/03/18 0415 01/06/18 0408 01/07/18 0555 01/08/18 0439  NA 143 146* 138 139 137  K 4.3 3.9 4.2 4.5 4.2  CL 112* 113* 107 103 106  CO2 21* 24 21* 24 23  GLUCOSE 127* 120* 154* 99 111*  BUN 51* 47* 63* 60* 62*  CREATININE 2.58* 2.75* 3.63* 3.11* 3.12*  CALCIUM 9.4 9.8 9.2 9.6 9.3   GFR: Estimated Creatinine Clearance: 13.5 mL/min (A) (by C-G formula based on SCr of 3.12 mg/dL (H)). Liver Function Tests: No results for input(s): AST, ALT, ALKPHOS, BILITOT, PROT, ALBUMIN in the last 168 hours. No results for input(s): LIPASE, AMYLASE in the last 168 hours. No results for input(s): AMMONIA in the last 168 hours. Coagulation Profile: Recent Labs  Lab 01/04/18 0408 01/05/18 0242 01/06/18 0408 01/07/18 0555 01/08/18 0439  INR 1.54 1.27 1.44 1.58 1.75   Cardiac Enzymes: No results for input(s): CKTOTAL, CKMB, CKMBINDEX, TROPONINI in the last 168 hours. BNP (last 3 results) No results for input(s): PROBNP in the last 8760 hours. HbA1C: No results for input(s): HGBA1C in the last 72 hours. CBG: No results for input(s): GLUCAP in the last 168 hours. Lipid Profile: No results for input(s): CHOL, HDL, LDLCALC, TRIG, CHOLHDL, LDLDIRECT in the last 72 hours. Thyroid Function Tests: No  results for input(s): TSH, T4TOTAL, FREET4, T3FREE, THYROIDAB in the last 72 hours. Anemia Panel: No results for input(s): VITAMINB12, FOLATE, FERRITIN, TIBC, IRON, RETICCTPCT in the last 72 hours. Urine analysis:    Component Value Date/Time   COLORURINE YELLOW 12/17/2007 1501   APPEARANCEUR CLEAR 12/17/2007 1501   LABSPEC 1.014 12/17/2007 1501   PHURINE 5.5 12/17/2007 1501   GLUCOSEU NEGATIVE 12/17/2007 1501   HGBUR NEGATIVE 12/17/2007 1501   BILIRUBINUR neg 06/10/2015 1019   KETONESUR NEGATIVE 12/17/2007 1501   PROTEINUR neg 06/10/2015 1019   PROTEINUR NEGATIVE 12/17/2007 1501   UROBILINOGEN 0.2 06/10/2015 1019   UROBILINOGEN 1.0 12/17/2007 1501   NITRITE neg 06/10/2015 1019   NITRITE NEGATIVE 12/17/2007 1501   LEUKOCYTESUR Negative 06/10/2015 1019   Sepsis Labs: @LABRCNTIP (procalcitonin:4,lacticidven:4)  ) Recent Results (from the past 240 hour(s))  Surgical pcr screen     Status: Abnormal   Collection Time: 01/02/18  9:20 PM  Result Value Ref Range Status   MRSA, PCR NEGATIVE NEGATIVE Final   Staphylococcus aureus POSITIVE (A) NEGATIVE Final    Comment: (NOTE) The Xpert SA Assay (FDA  approved for NASAL specimens in patients 35 years of age and older), is one component of a comprehensive surveillance program. It is not intended to diagnose infection nor to guide or monitor treatment.       Studies: No results found.  Scheduled Meds: . amiodarone  200 mg Oral Daily  . dorzolamide  1 drop Both Eyes TID  . hydrALAZINE  25 mg Oral Q8H  . isosorbide mononitrate  30 mg Oral Daily  . latanoprost  1 drop Both Eyes QHS  . levothyroxine  75 mcg Oral QAC breakfast  . metoprolol succinate  50 mg Oral Daily  . multivitamin with minerals  1 tablet Oral Daily  . polyethylene glycol  17 g Oral Daily  . potassium chloride SA  20 mEq Oral Daily  . senna-docusate  1 tablet Oral BID  . torsemide  60 mg Oral Daily  . traMADol  50 mg Oral Q12H  . Warfarin - Pharmacist Dosing  Inpatient   Does not apply q1800    Continuous Infusions: . methocarbamol (ROBAXIN) IV       LOS: 6 days     Kayleen Memos, MD [facility] Pager [phone]  If 7PM-7AM, please contact night-coverage [email or web address] Password [facility] 01/08/2018, 6:14 PM

## 2018-01-08 NOTE — Progress Notes (Signed)
Patient stable Ankle splinted Toes mobile and sensate Okay for nonweightbearing left lower extremity.  She will need follow-up in 10 days for clinical recheck and suture removal

## 2018-01-08 NOTE — Care Management Important Message (Signed)
Important Message  Patient Details  Name: Casey [name] MRN: [id number] Date of Birth: 05-Oct-1934   Medicare Important Message Given:  Yes    Loann Quill 01/08/2018, 11:34 AM

## 2018-01-08 NOTE — Progress Notes (Addendum)
Advanced Heart Failure Rounding Note  PCP-Cardiologist: Ezzard Standing, MD   Subjective:    S/p left ankle bimalleolar fracture fixation 01/04/18  BP improved with slight decrease in hydralazine.   Feeling good today. Pain improving. Denies lightheadedness or shortness of breath.  Cr stable at 3.1.   Objective:   Weight Range: 166 lb 14.2 oz (75.7 kg) Body mass index is 30.52 kg/m.   Vital Signs:   Temp:  [98.1 F (36.7 C)-98.9 F (37.2 C)] 98.1 F (36.7 C) (07/30 0425) Pulse Rate:  [66-76] 72 (07/30 1004) Resp:  [12-18] 15 (07/30 0425) BP: (92-116)/(52-62) 105/55 (07/30 1004) SpO2:  [93 %-100 %] 97 % (07/30 0425) Weight:  [166 lb 14.2 oz (75.7 kg)] 166 lb 14.2 oz (75.7 kg) (07/30 0806) Last BM Date: 01/07/18  Weight change: Filed Weights   01/04/18 1802 01/07/18 0536 01/08/18 0806  Weight: 160 lb (72.6 kg) 166 lb 0.1 oz (75.3 kg) 166 lb 14.2 oz (75.7 kg)    Intake/Output:   Intake/Output Summary (Last 24 hours) at 01/08/2018 1113 Last data filed at 01/08/2018 0900 Gross per 24 hour  Intake 600 ml  Output 500 ml  Net 100 ml      Physical Exam    General: Well appearing. No resp difficulty. HEENT: Normal Neck: Supple. JVP 5-6. Carotids 2+ bilat; no bruits. No thyromegaly or nodule noted. Cor: PMI nondisplaced. RRR, No M/G/R noted Lungs: CTAB, normal effort. Abdomen: Soft, non-tender, non-distended, no HSM. No bruits or masses. +BS  Extremities: No cyanosis, clubbing, or rash. R and LLE no edema. L ankle wrapped/soft ?cast.  Neuro: Alert & orientedx3, cranial nerves grossly intact. moves all 4 extremities w/o difficulty. Affect pleasant   Telemetry   NSR 70s, personally reviewed.   EKG    No new tracings.    Labs    CBC Recent Labs    01/06/18 0408 01/07/18 0555  WBC 7.4 6.8  HGB 12.7 12.4  HCT 39.1 38.9  MCV 104.3* 105.4*  PLT 80* 81*   Basic Metabolic Panel Recent Labs    01/07/18 0555 01/08/18 0439  NA 139 137  K 4.5 4.2    CL 103 106  CO2 24 23  GLUCOSE 99 111*  BUN 60* 62*  CREATININE 3.11* 3.12*  CALCIUM 9.6 9.3   Liver Function Tests No results for input(s): AST, ALT, ALKPHOS, BILITOT, PROT, ALBUMIN in the last 72 hours. No results for input(s): LIPASE, AMYLASE in the last 72 hours. Cardiac Enzymes No results for input(s): CKTOTAL, CKMB, CKMBINDEX, TROPONINI in the last 72 hours.  BNP: BNP (last 3 results) Recent Labs    07/06/17 1900  BNP 4,340.1*    ProBNP (last 3 results) No results for input(s): PROBNP in the last 8760 hours.   D-Dimer No results for input(s): DDIMER in the last 72 hours. Hemoglobin A1C No results for input(s): HGBA1C in the last 72 hours. Fasting Lipid Panel No results for input(s): CHOL, HDL, LDLCALC, TRIG, CHOLHDL, LDLDIRECT in the last 72 hours. Thyroid Function Tests No results for input(s): TSH, T4TOTAL, T3FREE, THYROIDAB in the last 72 hours.  Invalid input(s): FREET3  Other results:   Imaging    No results found.   Medications:     Scheduled Medications: . amiodarone  200 mg Oral Daily  . dorzolamide  1 drop Both Eyes TID  . hydrALAZINE  25 mg Oral Q8H  . isosorbide mononitrate  30 mg Oral Daily  . latanoprost  1 drop Both Eyes  QHS  . levothyroxine  75 mcg Oral QAC breakfast  . metoprolol succinate  50 mg Oral Daily  . multivitamin with minerals  1 tablet Oral Daily  . polyethylene glycol  17 g Oral Daily  . potassium chloride SA  20 mEq Oral Daily  . senna-docusate  1 tablet Oral BID  . torsemide  60 mg Oral Daily  . traMADol  50 mg Oral Q12H  . warfarin  5 mg Oral ONCE-1800  . Warfarin - Pharmacist Dosing Inpatient   Does not apply q1800    Infusions: . methocarbamol (ROBAXIN) IV      PRN Medications: acetaminophen, fentaNYL (SUBLIMAZE) injection, HYDROcodone-acetaminophen, methocarbamol **OR** methocarbamol (ROBAXIN) IV, metoCLOPramide **OR** metoCLOPramide (REGLAN) injection, ondansetron **OR** ondansetron (ZOFRAN)  IV    Patient Profile   Casey [name] is a 83 y.o. female  with a hx of NICM, CKD III-IV, nl cors 10/15/2017 cath, MDT CRT-D, no ACEI/ARB/ARNI due to CKD (tried all in the past), solitary kidney, PAF on coumadin, EF 15-20% 10/31/2017 w/ mod central MR by TEE (not a candidate for mitraclip),small perimembranous VSD, who was seen for the preop evaluation of ankle surgery at the request of Dr Marlou Sa.   Assessment/Plan   1. Mechanical fall with left ankle fracture - s/p left ankle bimalleolar fracture fixation 01/04/18 - Stable. 2. Chronic systolic CHF: Long-standing NICM, 5/05 cath without significant CAD. Echo in 5/17 with EF 20%, echo in 1/19 with EF 10-15%, prominent dyssynchrony, moderate-severe MR, PASP 91 (but ?if this represents VSD flow rather than TR). Complicated by CKD stage 4. Has MDT CRT-D device. In 1/19, she underwent V-V optimization per EP.  TEE in 5/19 showed moderate MR, EF 15-20%.   - She is stable back on her home medications.  - Continue torsemide 60 mg daily.  Cr stable near baseline.   - Continue Toprol XL 50 mg daily.   - No ACEI/ARB/ARNI: has failed in the past with AKI.  - Continue hydralazine at decreased dose of 25 mg TID - Continue Imdur 30 daily.  - Most recent creatinine contraindication for spironolactone.  3. Atrial fibrillation:  - Paroxysmal. Remains in NSR.  - Continue amiodarone. LFTs/TSH WNL 12/21/17. She will need regular eye exams.  - Continue warfarin.  4. CKD: Stage 4 - Stable near baseline.  5. H/o VSD:  - Small peri-membranous VSD on 5/19 TEE.  No change.  6. Mitral regurgitation: - TEE 5/19 with moderate functional MR.  Not candidate for Mitraclip.   No change.   Heart failure team will sign off as of 01/08/18  HF Medication Recommendations for Home: Torsemide 60 mg daily Toprol XL 50 mg daily Hydralazine 25 mg TID Imdur 30 mg daily.   Follow up as an outpatient will be placed in AVS.   Medication concerns reviewed with  patient and pharmacy team. Barriers identified: None at this time.   Length of Stay: [address]  Annamaria Helling  01/08/2018, 11:13 AM  Advanced Heart Failure Team Pager [phone] (M-F; 7a - 4p)  Please contact [facility] for night-coverage after hours (4p -7a ) and weekends on [email or web address]  Patient seen with PA, agree with the above note.  She is stable from a cardiac standpoint.  Will sign off, use the above cardiac meds.   Loralie Champagne 01/08/2018 4:22 PM

## 2018-01-08 NOTE — Progress Notes (Signed)
ANTICOAGULATION CONSULT NOTE - Follow Up Consult  Pharmacy Consult for Coumadin Indication: atrial fibrillation  Allergies  Allergen Reactions  . Codeine Rash    Patient Measurements: Height: 5' 2.01" (157.5 cm) Weight: 166 lb 14.2 oz (75.7 kg) IBW/kg (Calculated) : 50.12  Vital Signs: Temp: 98.1 F (36.7 C) (07/30 0425) Temp Source: Oral (07/30 0425) BP: 105/55 (07/30 1004) Pulse Rate: 72 (07/30 1004)  Labs: Recent Labs    01/06/18 0408 01/07/18 0555 01/08/18 0439  HGB 12.7 12.4  --   HCT 39.1 38.9  --   PLT 80* 81*  --   LABPROT 17.4* 18.7* 20.3*  INR 1.44 1.58 1.75  CREATININE 3.63* 3.11* 3.12*    Estimated Creatinine Clearance: 13.5 mL/min (A) (by C-G formula based on SCr of 3.12 mg/dL (H)).   Assessment: CC/HPI: fall  PMH: CHF, ICD, PPM, Afib, CKD4, HTN, gout,  Anticoag: Afib on Coumadin PTA. Vitamin K 5 mg PO 7/25. INR 1.58>1.75 (no bridging hep or LMWH) - PTA Coumadin 2mg  daily with admit INR 2.42  Renal: Scr 3.12  Heme/Onc: H&H 12.4/38.9, Plt 81  Goal of Therapy:  INR 2-3 Monitor platelets by anticoagulation protocol: Yes   Plan:  Coumadin 5 mg po x 1 tonight Daily INR  Levester Fresh, PharmD, BCPS, BCCCP Clinical Pharmacist [phone]  Please check AMION for all [place] numbers  01/08/2018 10:32 AM

## 2018-01-09 ENCOUNTER — Encounter (HOSPITAL_COMMUNITY): Payer: Self-pay | Admitting: Orthopedic Surgery

## 2018-01-09 DIAGNOSIS — S82842S Displaced bimalleolar fracture of left lower leg, sequela: Secondary | ICD-10-CM | POA: Diagnosis not present

## 2018-01-09 DIAGNOSIS — Z7401 Bed confinement status: Secondary | ICD-10-CM | POA: Diagnosis not present

## 2018-01-09 DIAGNOSIS — I700 Atherosclerosis of aorta: Secondary | ICD-10-CM | POA: Diagnosis not present

## 2018-01-09 DIAGNOSIS — Z7901 Long term (current) use of anticoagulants: Secondary | ICD-10-CM | POA: Diagnosis not present

## 2018-01-09 DIAGNOSIS — I480 Paroxysmal atrial fibrillation: Secondary | ICD-10-CM | POA: Diagnosis not present

## 2018-01-09 DIAGNOSIS — H8112 Benign paroxysmal vertigo, left ear: Secondary | ICD-10-CM | POA: Diagnosis not present

## 2018-01-09 DIAGNOSIS — E785 Hyperlipidemia, unspecified: Secondary | ICD-10-CM | POA: Diagnosis not present

## 2018-01-09 DIAGNOSIS — S82892A Other fracture of left lower leg, initial encounter for closed fracture: Secondary | ICD-10-CM | POA: Diagnosis not present

## 2018-01-09 DIAGNOSIS — H409 Unspecified glaucoma: Secondary | ICD-10-CM | POA: Diagnosis not present

## 2018-01-09 DIAGNOSIS — I110 Hypertensive heart disease with heart failure: Secondary | ICD-10-CM | POA: Diagnosis not present

## 2018-01-09 DIAGNOSIS — I5022 Chronic systolic (congestive) heart failure: Secondary | ICD-10-CM | POA: Diagnosis not present

## 2018-01-09 DIAGNOSIS — Z1331 Encounter for screening for depression: Secondary | ICD-10-CM | POA: Diagnosis not present

## 2018-01-09 DIAGNOSIS — N184 Chronic kidney disease, stage 4 (severe): Secondary | ICD-10-CM | POA: Diagnosis not present

## 2018-01-09 DIAGNOSIS — E039 Hypothyroidism, unspecified: Secondary | ICD-10-CM | POA: Diagnosis not present

## 2018-01-09 DIAGNOSIS — M2550 Pain in unspecified joint: Secondary | ICD-10-CM | POA: Diagnosis not present

## 2018-01-09 DIAGNOSIS — N183 Chronic kidney disease, stage 3 (moderate): Secondary | ICD-10-CM | POA: Diagnosis not present

## 2018-01-09 DIAGNOSIS — R531 Weakness: Secondary | ICD-10-CM | POA: Diagnosis not present

## 2018-01-09 DIAGNOSIS — I472 Ventricular tachycardia: Secondary | ICD-10-CM | POA: Diagnosis not present

## 2018-01-09 DIAGNOSIS — M109 Gout, unspecified: Secondary | ICD-10-CM | POA: Diagnosis not present

## 2018-01-09 DIAGNOSIS — R0602 Shortness of breath: Secondary | ICD-10-CM | POA: Diagnosis not present

## 2018-01-09 DIAGNOSIS — M2681 Anterior soft tissue impingement: Secondary | ICD-10-CM | POA: Diagnosis not present

## 2018-01-09 DIAGNOSIS — I5020 Unspecified systolic (congestive) heart failure: Secondary | ICD-10-CM | POA: Diagnosis not present

## 2018-01-09 DIAGNOSIS — R262 Difficulty in walking, not elsewhere classified: Secondary | ICD-10-CM | POA: Diagnosis not present

## 2018-01-09 DIAGNOSIS — E668 Other obesity: Secondary | ICD-10-CM | POA: Diagnosis not present

## 2018-01-09 DIAGNOSIS — R0600 Dyspnea, unspecified: Secondary | ICD-10-CM | POA: Diagnosis not present

## 2018-01-09 DIAGNOSIS — Z9181 History of falling: Secondary | ICD-10-CM | POA: Diagnosis not present

## 2018-01-09 DIAGNOSIS — S82891A Other fracture of right lower leg, initial encounter for closed fracture: Secondary | ICD-10-CM | POA: Diagnosis not present

## 2018-01-09 DIAGNOSIS — I429 Cardiomyopathy, unspecified: Secondary | ICD-10-CM | POA: Diagnosis not present

## 2018-01-09 DIAGNOSIS — F349 Persistent mood [affective] disorder, unspecified: Secondary | ICD-10-CM | POA: Diagnosis not present

## 2018-01-09 DIAGNOSIS — S82842D Displaced bimalleolar fracture of left lower leg, subsequent encounter for closed fracture with routine healing: Secondary | ICD-10-CM | POA: Diagnosis not present

## 2018-01-09 DIAGNOSIS — Z95810 Presence of automatic (implantable) cardiac defibrillator: Secondary | ICD-10-CM | POA: Diagnosis not present

## 2018-01-09 DIAGNOSIS — S82842A Displaced bimalleolar fracture of left lower leg, initial encounter for closed fracture: Secondary | ICD-10-CM | POA: Diagnosis not present

## 2018-01-09 DIAGNOSIS — R1312 Dysphagia, oropharyngeal phase: Secondary | ICD-10-CM | POA: Diagnosis not present

## 2018-01-09 DIAGNOSIS — M6281 Muscle weakness (generalized): Secondary | ICD-10-CM | POA: Diagnosis not present

## 2018-01-09 DIAGNOSIS — I130 Hypertensive heart and chronic kidney disease with heart failure and stage 1 through stage 4 chronic kidney disease, or unspecified chronic kidney disease: Secondary | ICD-10-CM | POA: Diagnosis not present

## 2018-01-09 DIAGNOSIS — I428 Other cardiomyopathies: Secondary | ICD-10-CM | POA: Diagnosis not present

## 2018-01-09 DIAGNOSIS — N179 Acute kidney failure, unspecified: Secondary | ICD-10-CM | POA: Diagnosis not present

## 2018-01-09 DIAGNOSIS — Q210 Ventricular septal defect: Secondary | ICD-10-CM | POA: Diagnosis not present

## 2018-01-09 LAB — CBC
HEMATOCRIT: 38.2 % (ref 36.0–46.0)
HEMOGLOBIN: 12.3 g/dL (ref 12.0–15.0)
MCH: 33.4 pg (ref 26.0–34.0)
MCHC: 32.2 g/dL (ref 30.0–36.0)
MCV: 103.8 fL — ABNORMAL HIGH (ref 78.0–100.0)
Platelets: 99 10*3/uL — ABNORMAL LOW (ref 150–400)
RBC: 3.68 MIL/uL — AB (ref 3.87–5.11)
RDW: 11.8 % (ref 11.5–15.5)
WBC: 6.4 10*3/uL (ref 4.0–10.5)

## 2018-01-09 LAB — BASIC METABOLIC PANEL
Anion gap: 9 (ref 5–15)
BUN: 58 mg/dL — AB (ref 8–23)
CO2: 23 mmol/L (ref 22–32)
CREATININE: 2.68 mg/dL — AB (ref 0.44–1.00)
Calcium: 9.5 mg/dL (ref 8.9–10.3)
Chloride: 109 mmol/L (ref 98–111)
GFR calc Af Amer: 18 mL/min — ABNORMAL LOW (ref >60)
GFR, EST NON AFRICAN AMERICAN: 16 mL/min — AB (ref >60)
GLUCOSE: 110 mg/dL — AB (ref 70–99)
POTASSIUM: 4.1 mmol/L (ref 3.5–5.1)
SODIUM: 141 mmol/L (ref 135–145)

## 2018-01-09 LAB — PROTIME-INR
INR: 1.89
Prothrombin Time: 21.6 seconds — ABNORMAL HIGH (ref 11.4–15.2)

## 2018-01-09 MED ORDER — POLYETHYLENE GLYCOL 3350 17 G PO PACK: 17.0000 g | PACK | Freq: Every day | ORAL | 0 refills | Status: DC

## 2018-01-09 MED ORDER — HYDRALAZINE HCL 25 MG PO TABS: 25.0000 mg | ORAL_TABLET | Freq: Three times a day (TID) | ORAL | 0 refills | Status: DC

## 2018-01-09 MED ORDER — ISOSORBIDE MONONITRATE ER 30 MG PO TB24: 30.0000 mg | ORAL_TABLET | Freq: Every day | ORAL | 0 refills | Status: DC

## 2018-01-09 MED ORDER — POTASSIUM CHLORIDE CRYS ER 20 MEQ PO TBCR: 20.0000 meq | EXTENDED_RELEASE_TABLET | Freq: Every day | ORAL | 0 refills | Status: DC

## 2018-01-09 MED ORDER — WARFARIN SODIUM 2 MG PO TABS: 2.0000 mg | ORAL_TABLET | Freq: Every day | ORAL | 0 refills | Status: DC

## 2018-01-09 MED ORDER — TORSEMIDE 20 MG PO TABS: 60.0000 mg | ORAL_TABLET | Freq: Every day | ORAL | 0 refills | Status: DC

## 2018-01-09 MED ORDER — WARFARIN SODIUM 2.5 MG PO TABS
2.5000 mg | ORAL_TABLET | Freq: Once | ORAL | Status: AC
  Administered 2018-01-09: 2.5 mg via ORAL
  Filled 2018-01-09: qty 1

## 2018-01-09 MED ORDER — METOPROLOL SUCCINATE ER 50 MG PO TB24: 50.0000 mg | ORAL_TABLET | Freq: Every day | ORAL | 0 refills | Status: DC

## 2018-01-09 NOTE — Progress Notes (Signed)
ANTICOAGULATION CONSULT NOTE - Follow Up Consult  Pharmacy Consult for Coumadin Indication: atrial fibrillation  Allergies  Allergen Reactions  . Codeine Rash    Patient Measurements: Height: 5' 2.01" (157.5 cm) Weight: 168 lb 3.4 oz (76.3 kg) IBW/kg (Calculated) : 50.12  Vital Signs: Temp: 98.1 F (36.7 C) (07/31 0639) Temp Source: Oral (07/31 0639) BP: 106/65 (07/31 0639) Pulse Rate: 70 (07/31 0639)  Labs: Recent Labs    01/07/18 0555 01/08/18 0439 01/09/18 0426  HGB 12.4  --  12.3  HCT 38.9  --  38.2  PLT 81*  --  99*  LABPROT 18.7* 20.3* 21.6*  INR 1.58 1.75 1.89  CREATININE 3.11* 3.12* 2.68*    Estimated Creatinine Clearance: 15.7 mL/min (A) (by C-G formula based on SCr of 2.68 mg/dL (H)).   Assessment: CC/HPI: fall  PMH: CHF, ICD, PPM, Afib, CKD4, HTN, gout,  Anticoag: Afib on Coumadin PTA. Vitamin K 5 mg PO 7/25. INR 1.58>1.75>1.89 (no bridging hep or LMWH) - PTA Coumadin 2mg  daily with admit INR 2.42  Renal: Scr 3.12>2.68  Heme/Onc: H&H 12.3/38.2, Plt 99  Goal of Therapy:  INR 2-3 Monitor platelets by anticoagulation protocol: Yes   Plan:  Coumadin 2.5 mg po x 1 tonight Daily INR  Levester Fresh, PharmD, BCPS, BCCCP Clinical Pharmacist [phone]  Please check AMION for all [place] numbers  01/09/2018 1:11 PM

## 2018-01-09 NOTE — Progress Notes (Signed)
Physical Therapy Treatment Patient Details Name: Casey [name] MRN: [id number] DOB: 1935/01/05 Today's Date: 01/09/2018    History of Present Illness Patient is an 83 year old fermale S/P left ankle ORIF on 01/04/2018. PMH: tachycardia, septal defect; lumbar ddd; gout, ulcers, CHF, CKD; atheroscleroiss     PT Comments    Pt remains limited secondary to weakness. She was able to maintain NWB L LE throughout independently; however, with transfers and hopping pt fatigued very rapidly. Pt would continue to benefit from skilled physical therapy services at this time while admitted and after d/c to address the below listed limitations in order to improve overall safety and independence with functional mobility.    Follow Up Recommendations  SNF     Equipment Recommendations  Rolling walker with 5" wheels    Recommendations for Other Services       Precautions / Restrictions Precautions Precautions: Fall Restrictions Weight Bearing Restrictions: Yes LLE Weight Bearing: Non weight bearing    Mobility  Bed Mobility               General bed mobility comments: pt OOB in recliner chair upon arrival  Transfers Overall transfer level: Needs assistance Equipment used: Rolling walker (2 wheeled) Transfers: Sit to/from Stand Sit to Stand: Min assist         General transfer comment: increased time and effort, cueing for safe hand placement and technique, min A to power into standing and for stability with transition  Ambulation/Gait Ambulation/Gait assistance: Min assist           General Gait Details: pt able to hop forwards and backwards, ~3-4 hops on R LE with RW and min A for stability   Stairs             Wheelchair Mobility    Modified Rankin (Stroke Patients Only)       Balance Overall balance assessment: Needs assistance Sitting-balance support: No upper extremity supported Sitting balance-Leahy Scale: Good     Standing balance  support: Bilateral upper extremity supported Standing balance-Leahy Scale: Poor Standing balance comment: reliant on bilateral UEs on RW                            Cognition Arousal/Alertness: Awake/alert Behavior During Therapy: WFL for tasks assessed/performed Overall Cognitive Status: Within Functional Limits for tasks assessed                                        Exercises General Exercises - Lower Extremity Long Arc Quad: AROM;Strengthening;Left;10 reps;Seated Hip ABduction/ADduction: AROM;Strengthening;Left;10 reps;Seated Straight Leg Raises: AROM;Strengthening;Left;10 reps;Seated    General Comments        Pertinent Vitals/Pain Pain Assessment: No/denies pain    Home Living                      Prior Function            PT Goals (current goals can now be found in the care plan section) Acute Rehab PT Goals PT Goal Formulation: With patient Time For Goal Achievement: 01/12/18 Potential to Achieve Goals: Good Progress towards PT goals: Progressing toward goals    Frequency    Min 3X/week      PT Plan Current plan remains appropriate    Co-evaluation  AM-PAC PT "6 Clicks" Daily Activity  Outcome Measure  Difficulty turning over in bed (including adjusting bedclothes, sheets and blankets)?: A Little Difficulty moving from lying on back to sitting on the side of the bed? : A Little Difficulty sitting down on and standing up from a chair with arms (e.g., wheelchair, bedside commode, etc,.)?: Unable Help needed moving to and from a bed to chair (including a wheelchair)?: A Little Help needed walking in hospital room?: A Lot Help needed climbing 3-5 steps with a railing? : Total 6 Click Score: 13    End of Session Equipment Utilized During Treatment: Gait belt Activity Tolerance: Patient tolerated treatment well Patient left: in chair;with call bell/phone within reach;with chair alarm set Nurse  Communication: Mobility status PT Visit Diagnosis: Unsteadiness on feet (R26.81);Difficulty in walking, not elsewhere classified (R26.2);Pain Pain - Right/Left: Left Pain - part of body: Ankle and joints of foot     Time: 1142-1153 PT Time Calculation (min) (ACUTE ONLY): 11 min  Charges:  $Therapeutic Activity: 8-22 mins                     [place], [place], [place] Bancroft 01/09/2018, 1:07 PM

## 2018-01-09 NOTE — Progress Notes (Signed)
Report  Called to [place] at this time

## 2018-01-09 NOTE — Progress Notes (Signed)
Patient transported by [place] to [facility]

## 2018-01-09 NOTE — Discharge Summary (Signed)
Discharge Summary  Casey [name] JJH:[id number] DOB: 1934/12/16  PCP: Elby Showers, MD  Admit date: 01/01/2018 Discharge date: 01/09/2018  Time spent: 25 minutes   Recommendations for Outpatient Follow-up:  1. Follow-up with your PCP 2. Follow-up with cardiology 3. Follow-up with orthopedic surgery 4. Take your medications as prescribed 5. Continue physical therapy at SNF 6. Fall precautions 7. Repeat INR on January 10, 2018.  Discharge Diagnoses:  Active Hospital Problems   Diagnosis Date Noted  . Closed left ankle fracture 01/02/2018  . Closed displaced bimalleolar fracture of left lower leg   . Ankle fracture 01/02/2018  . Ventricular septal defect (VSD)   . Kidney disease, chronic, stage III (GFR 30-59 ml/min) (HCC)   . Hypothyroidism 03/11/2013  . Gout   . Paroxysmal atrial fibrillation (HCC)   . Nonischemic cardiomyopathy ([place])   . Biventricular implantable cardioverter-defibrillator in [facility] Problems  No resolved problems to display.    Discharge Condition: Stable  Diet recommendation: Heart healthy diet with low-sodium less than 2 g/day  Vitals:   01/08/18 2005 01/09/18 0639  BP: (!) 96/58 106/65  Pulse: 76 70  Resp: 15 16  Temp: 98.8 F (37.1 C) 98.1 F (36.7 C)  SpO2: 98% 100%    History of present illness:  Casey Demorest McCormickis a 83 y.o.femalewithhistory of chronic systolic heart failure status post defibrillator placement and pacemaker, P. atrial fibrillation on coumadin, chronic kidney disease stage IV, hypertension, gout who has had recent acute gouty attacks of left ankle and is on colchicine. Has been having increasing bowel movements and had gone to the bathroom and on the way to the room patient felt dizzy and fell. Patient states she got entangled on a cloth on the way back. Denies losing consciousness or having chest pain or palpitation. Admitted for acute left ankle fracture.  01/03/2018: Left ankle  surgery postponned due to elevated INR. 01/04/18: Hypotensive and asymptomatic in the setting of significant heart disease with chronic systolic CHF with EF of 81%.  Cardiology consulted for preop cardiovascular clearance. POD #0 post left ankle bimalleolar fracture fixation.  Heart failure team followed and adjusted medication to prevent hypotension.  01/09/2018: Patient seen and examined at bedside.  No acute events overnight.  Pain is well controlled on current pain management.  She has no new complaints.  She denies chest pain, palpitations, dizziness, dyspnea or nausea.  On the day of discharge, the patient was hemodynamically stable.  She will need to follow-up with her PCP, cardiology, orthopedic surgery, continue physical therapy at SNF posthospitalization.     Hospital Course:  Principal Problem:   Closed left ankle fracture Active Problems:   Biventricular implantable cardioverter-defibrillator in situ   Nonischemic cardiomyopathy (HCC)   Paroxysmal atrial fibrillation (HCC)   Gout   Hypothyroidism   Kidney disease, chronic, stage III (GFR 30-59 ml/min) (HCC)   Ventricular septal defect (VSD)   Ankle fracture   Closed displaced bimalleolar fracture of left lower leg  POD #5 post left ankle bimalleolar fracture fixation Status post mechanical fall at home Left ankle repair 01/04/2018 Orthopedic surgery followed and signed off on 01/08/2018 Continue pain management Continue bowel regimen  AKI on CKD 4 Creatinine improving 2.68 from 3.12 Baseline creatinine 2.4 with GFR 24 Continue to avoid nephrotoxic agents  Sub-therapeutic INR, improving INR 1.89 from 1.75 Continue Coumadin Repeat INR on 01/10/2018  Hypotension, resolved Cardiology adjusted cardiac meds Maintain MAP >65 if possible Avoid IV fluid due to  significant systolic CHF Continue Toprol-XL 50 mg daily, torsemide 60 mg daily, hydralazine 25 mg 3 times daily, Imdur 30 mg daily as recommended by  cardiology.  Chronic systolic CHF with EF of 89% Caution with IV fluid Continue current medications as recommended by cardiology Strict I's and O's Daily weight Low-sodium diet less than 2 g/day  MR/VSD Cardiology following  Paroxysmal A. fib Rate controlled Continue Toprol-XL C/w coumadin  Gout Appears stable at this time  Hypothyroidism Continue Synthroid     Procedures:  Left ankle repair on 01/04/2018  Consultations:  Orthopedic surgery  Cardiology  Heart failure team  Discharge Exam: BP 106/65 (BP Location: Right Arm)   Pulse 70   Temp 98.1 F (36.7 C) (Oral)   Resp 16   Ht 5' 2.01" (1.575 m)   Wt 76.3 kg (168 lb 3.4 oz)   SpO2 100%   BMI 30.76 kg/m  . General: 83 y.o. year-old female well developed well nourished in no acute distress.  Alert and oriented x3. . Cardiovascular: Regular rate and rhythm with no rubs or gallops.  No thyromegaly or JVD noted.   Marland Kitchen Respiratory: Clear to auscultation with no wheezes or rales. Good inspiratory effort. . Abdomen: Soft nontender nondistended with normal bowel sounds x4 quadrants. . Musculoskeletal: No lower extremity edema. 2/4 pulses in all 4 extremities. . Skin: No ulcerative lesions noted or rashes, . Psychiatry: Mood is appropriate for condition and setting  Discharge Instructions You were cared for by a hospitalist during your hospital stay. If you have any questions about your discharge medications or the care you received while you were in the hospital after you are discharged, you can call the unit and asked to speak with the hospitalist on call if the hospitalist that took care of you is not available. Once you are discharged, your primary care physician will handle any further medical issues. Please note that NO REFILLS for any discharge medications will be authorized once you are discharged, as it is imperative that you return to your primary care physician (or establish a relationship with a primary  care physician if you do not have one) for your aftercare needs so that they can reassess your need for medications and monitor your lab values.   Allergies as of 01/09/2018      Reactions   Codeine Rash      Medication List    STOP taking these medications   allopurinol 100 MG tablet Commonly known as:  ZYLOPRIM   amiodarone 200 MG tablet Commonly known as:  PACERONE     TAKE these medications   colchicine 0.6 MG tablet Take 1 tablet (0.6 mg total) by mouth daily as needed.   dorzolamide 2 % ophthalmic solution Commonly known as:  TRUSOPT Place 1 drop into both eyes 3 (three) times daily.   hydrALAZINE 25 MG tablet Commonly known as:  APRESOLINE Take 1 tablet (25 mg total) by mouth every 8 (eight) hours. What changed:    how much to take  when to take this   isosorbide mononitrate 30 MG 24 hr tablet Commonly known as:  IMDUR Take 1 tablet (30 mg total) by mouth daily. Start taking on:  01/10/2018   Latanoprost 0.005 % Emul Place 1 drop into both eyes at bedtime.   levothyroxine 75 MCG tablet Commonly known as:  SYNTHROID, LEVOTHROID Take 75 mcg by mouth daily before breakfast.   metoprolol succinate 50 MG 24 hr tablet Commonly known as:  TOPROL-XL Take 1 tablet (  50 mg total) by mouth daily. Take with or immediately following a meal. Start taking on:  01/10/2018 What changed:  additional instructions   ONE-A-DAY WOMENS FORMULA Tabs Take 1 tablet by mouth daily.   polyethylene glycol packet Commonly known as:  MIRALAX / GLYCOLAX Take 17 g by mouth daily. Start taking on:  01/10/2018   potassium chloride SA 20 MEQ tablet Commonly known as:  K-DUR,KLOR-CON Take 1 tablet (20 mEq total) by mouth daily. Start taking on:  01/10/2018   torsemide 20 MG tablet Commonly known as:  DEMADEX Take 3 tablets (60 mg total) by mouth daily. Start taking on:  01/10/2018 What changed:    how much to take  how to take this  when to take this  additional instructions    warfarin 2 MG tablet Commonly known as:  COUMADIN Take 1 tablet (2 mg total) by mouth daily.      Allergies  Allergen Reactions  . Codeine Rash   Follow-up Information    Baxley, Cresenciano Lick, MD. Call in 1 day(s).   Specialty:  Internal Medicine Why:  Please call for an appointment. Contact information: [address] [address] [phone]        Jacolyn Reedy, MD .   Specialty:  Cardiology Contact information: [address] [place] [address] [phone]            The results of significant diagnostics from this hospitalization (including imaging, microbiology, ancillary and laboratory) are listed below for reference.    Significant Diagnostic Studies: Dg Knee 1-2 Views Left  Result Date: 01/02/2018 CLINICAL DATA:  Closed reduction of LEFT ankle fractures yesterday with casting. Remote ORIF of a tibial fracture with an intramedullary nail. Patient states chronic RIGHT knee pain since placement of the intramedullary nail. EXAM: LEFT KNEE - 1-2 VIEW COMPARISON:  None. FINDINGS: No evidence of acute, subacute or healed fractures. Moderate MEDIAL compartment joint space narrowing. Mild LATERAL and patellofemoral compartment joint space narrowing. Mild osseous demineralization. No complicating features related to the intramedullary nail in the visualized proximal tibia. Calcified loose body in the joint space. Small joint effusion. Femoropopliteal and tibioperoneal artery atherosclerosis. IMPRESSION: 1. No acute or subacute osseous abnormality. 2. Mild-to-moderate osteoarthritis, most prominent in the MEDIAL compartment. 3. Small joint effusion.  Calcified loose body in the joint. 4. No complicating features related to the intramedullary nail in the visualized proximal tibia. Electronically Signed   By: Evangeline Dakin M.D.   On: 01/02/2018 10:58   Dg Ankle Complete Left  Result Date: 01/01/2018 CLINICAL DATA:  Post reduction EXAM:  LEFT ANKLE COMPLETE - 3+ VIEW COMPARISON:  01/01/2018 FINDINGS: Placement of cast material obscures bone detail. Partially visualized surgical rod within the distal tibia. Old fracture deformity of the distal shaft of the tibia. Acute medial malleolar fracture with mild displacement and slight asymmetric widening of the superior medial mortise. Acute distal fibular fracture with residual 1/4 bone with of lateral displacement of distal fracture fragment. Suspected posterior malleolar fracture with mild residual displacement. Residual widened appearance of the anterior joint space with anterior avulsion fracture off the distal tibia. IMPRESSION: 1. Interim casting of the left ankle 2. Acute mildly displaced medial and lateral malleolar fractures. Suspected posterior malleolar fracture with mild displacement 3. Residual widening of the anterior ankle joint with mild posterior subluxation of talar dome. Probable avulsion fracture off the distal anterior tibia. Electronically Signed   By: Donavan Foil M.D.   On: 01/01/2018 23:58  Dg Ankle Complete Left  Result Date: 01/01/2018 CLINICAL DATA:  83 year old with left lower leg injury after fall. Dislocation. EXAM: LEFT ANKLE COMPLETE - 3+ VIEW COMPARISON:  None. FINDINGS: Intramedullary rod in the distal tibia related to old fracture. Displaced fractures involving the medial malleolus and lateral malleolus. Disruption of the ankle mortise. Soft tissue swelling associated with the fractures. There is at least posterior subluxation and possibly dislocation of the ankle joint. Prominent plantar calcaneal spur. IMPRESSION: Left ankle fractures with disruption of the ankle mortise. Posterior subluxation or dislocation at the ankle joint. Electronically Signed   By: Markus Daft M.D.   On: 01/01/2018 21:31   Ct Head Wo Contrast  Result Date: 01/01/2018 CLINICAL DATA:  Fall after dizziness.  No loss of consciousness. EXAM: CT HEAD WITHOUT CONTRAST TECHNIQUE: Contiguous  axial images were obtained from the base of the skull through the vertex without intravenous contrast. COMPARISON:  None. FINDINGS: Brain: Mild diffuse cortical atrophy is noted. Mild chronic ischemic white matter disease is noted. No mass effect or midline shift is noted. Ventricular size is within normal limits. There is no evidence of mass lesion, hemorrhage or acute infarction. Vascular: No hyperdense vessel or unexpected calcification. Skull: Normal. Negative for fracture or focal lesion. Sinuses/Orbits: No acute finding. Other: None. IMPRESSION: Mild diffuse cortical atrophy. Mild chronic ischemic white matter disease. No acute intracranial abnormality seen. Electronically Signed   By: Marijo Conception, M.D.   On: 01/01/2018 22:36   Dg Ankle Left Port  Result Date: 01/04/2018 CLINICAL DATA:  Postop ankle fracture repair. EXAM: PORTABLE LEFT ANKLE - 2 VIEW COMPARISON:  January 01, 2018 FINDINGS: A plate is affixed to the distal fibula across the recently identified fracture. There is a screw through the medial malleolar fracture as well. A rod in the distal tibia remains. Hardware appears to be in good position. IMPRESSION: Ankle fracture repair as above. Hardware appears to be in good position. Electronically Signed   By: Dorise Bullion III M.D   On: 01/04/2018 21:36    Microbiology: Recent Results (from the past 240 hour(s))  Surgical pcr screen     Status: Abnormal   Collection Time: 01/02/18  9:20 PM  Result Value Ref Range Status   MRSA, PCR NEGATIVE NEGATIVE Final   Staphylococcus aureus POSITIVE (A) NEGATIVE Final    Comment: (NOTE) The Xpert SA Assay (FDA approved for NASAL specimens in patients 66 years of age and older), is one component of a comprehensive surveillance program. It is not intended to diagnose infection nor to guide or monitor treatment.      Labs: Basic Metabolic Panel: Recent Labs  Lab 01/03/18 0415 01/06/18 0408 01/07/18 0555 01/08/18 0439 01/09/18 0426    NA 146* 138 139 137 141  K 3.9 4.2 4.5 4.2 4.1  CL 113* 107 103 106 109  CO2 24 21* 24 23 23   GLUCOSE 120* 154* 99 111* 110*  BUN 47* 63* 60* 62* 58*  CREATININE 2.75* 3.63* 3.11* 3.12* 2.68*  CALCIUM 9.8 9.2 9.6 9.3 9.5   Liver Function Tests: No results for input(s): AST, ALT, ALKPHOS, BILITOT, PROT, ALBUMIN in the last 168 hours. No results for input(s): LIPASE, AMYLASE in the last 168 hours. No results for input(s): AMMONIA in the last 168 hours. CBC: Recent Labs  Lab 01/03/18 0415 01/05/18 0242 01/06/18 0408 01/07/18 0555 01/09/18 0426  WBC 6.5 8.4 7.4 6.8 6.4  HGB 13.7 13.8 12.7 12.4 12.3  HCT 42.0 43.8 39.1 38.9 38.2  MCV 102.7* 106.8* 104.3* 105.4* 103.8*  PLT 107* 96* 80* 81* 99*   Cardiac Enzymes: No results for input(s): CKTOTAL, CKMB, CKMBINDEX, TROPONINI in the last 168 hours. BNP: BNP (last 3 results) Recent Labs    07/06/17 1900  BNP 4,340.1*    ProBNP (last 3 results) No results for input(s): PROBNP in the last 8760 hours.  CBG: No results for input(s): GLUCAP in the last 168 hours.     Signed:  Kayleen Memos, MD [facility] 01/09/2018, 10:50 AM

## 2018-01-09 NOTE — Clinical Social Work Placement (Signed)
   CLINICAL SOCIAL WORK PLACEMENT  NOTE  Date:  01/09/2018  Patient Details  Name: ARITHA HUCKEBA MRN: [id number] Date of Birth: 01/29/1935  Clinical Social Work is seeking post-discharge placement for this patient at the   level of care (*CSW will initial, date and re-position this form in  chart as items are completed):  Yes   Patient/family provided with [facility] Work Department's list of facilities offering this level of care within the geographic area requested by the patient (or if unable, by the patient's family).  Yes   Patient/family informed of their freedom to choose among providers that offer the needed level of care, that participate in Medicare, Medicaid or managed care program needed by the patient, have an available bed and are willing to accept the patient.      Patient/family informed of [facility]'s ownership interest in [facility] and [facility], as well as of the fact that they are under no obligation to receive care at these facilities.  PASRR submitted to EDS on       PASRR number received on 01/06/18     Existing PASRR number confirmed on       FL2 transmitted to all facilities in geographic area requested by pt/family on 01/06/18     FL2 transmitted to all facilities within larger geographic area on       Patient informed that his/her managed care company has contracts with or will negotiate with certain facilities, including the following:        Yes   Patient/family informed of bed offers received.  Patient chooses bed at [facility] recommends and patient chooses bed at      Patient to be transferred to [facility] on 01/09/18.  Patient to be transferred to facility by [facility]     Patient family notified on 01/09/18 of transfer.  Name of family member notified:  Lenette- daughter     PHYSICIAN       Additional Comment:     _______________________________________________ Vinie Sill, [place] 01/09/2018, 3:57 PM

## 2018-01-09 NOTE — Progress Notes (Signed)
Pt will DC to: [facility]  DC date: 01/09/2018 Family notified: Lenette- daughter Transport by: [facility]  RN, patient, and facility notified of DC. Discharge Summary sent to facility. RN given number for report [phone] room 123.  DC packet on chart. Ambulance transport requested for patient.   CSW signing off.  Thurmond Butts, [place] Social Worker [phone]

## 2018-01-10 ENCOUNTER — Non-Acute Institutional Stay (SKILLED_NURSING_FACILITY): Payer: Medicare Other | Admitting: Internal Medicine

## 2018-01-10 ENCOUNTER — Encounter: Payer: Self-pay | Admitting: Internal Medicine

## 2018-01-10 DIAGNOSIS — S82842D Displaced bimalleolar fracture of left lower leg, subsequent encounter for closed fracture with routine healing: Secondary | ICD-10-CM

## 2018-01-10 DIAGNOSIS — I5022 Chronic systolic (congestive) heart failure: Secondary | ICD-10-CM

## 2018-01-10 DIAGNOSIS — M109 Gout, unspecified: Secondary | ICD-10-CM

## 2018-01-10 NOTE — Patient Instructions (Signed)
See assessment and plan under each diagnosis in the problem list and acutely for this visit 

## 2018-01-10 NOTE — Assessment & Plan Note (Addendum)
12/21/17 uric acid 11.5  To prevent gout the minimal uric acid goal is < 7; preferred is < 6, ideal or protective value is < 5 .  The most common cause of elevated uric acid is the ingestion of sugar from high fructose corn syrup sources in processed foods & drinks. You should consume less than 40 grams (ideally ZERO) of sugar per day from foods and drinks with high fructose corn syrup as number 1,2, 3, or #4 on the label. Chronic elevation of uric acid is not an isolated arthritic issue;it also increases cardiac & kidney disease risk. Short course of prednisone should gout flare.CKD limits allopurinol prophylaxis.

## 2018-01-10 NOTE — Progress Notes (Signed)
NURSING HOME LOCATION:  [facility] ROOM NUMBER:  [id number]  CODE STATUS:  Full Code  PCP:  Elby Showers MD  This is a comprehensive admission note to [facility] performed on this date less than 30 days from date of admission. Included are preadmission medical/surgical history; reconciled medication list; family history; social history and comprehensive review of systems.  Corrections and additions to the records were documented. Comprehensive physical exam was also performed. Additionally a clinical summary was entered for each active diagnosis pertinent to this admission in the Problem List to enhance continuity of care.  HPI: The patient was hospitalized 7/23-7/31/19 with acute left ankle fracture sustained in a mechanical fall. Apparently she was having a flare of gout for which she was on colchicine with increased bathroom frequency increased due to the drug. She became dizzy after sitting on the commode with her head flexed forward and fell on the way from the bathroom. She specifically denies any cardiac or neurologic prodrome prior to the fall. Ankle surgery was initially delayed due to elevated INR Cardiology provided preop clearance because of a history of chronic systolic heart failure and PAF for which she is on the warfarin. Left ankle bimalleolar fracture fixation was completed 7/26 by Dr. Marlou Sa. Hospital course was uncomplicated. She was discharged to the SNF for PT/OT.  Uric acid was 11.5 on 12/21/17. The patient is not on HCTZ. Labs 01/09/18 revealed a creatinine of 2.68 and 58 and GFR of 18. No significant anemia but her MCV was 103.8.Her last A1c on record was 5.5% on 06/13/16.  Past medical and surgical history:Includes placement of a defibrillator and pacemaker. Medical diagnoses also include chronic kidney disease stage IV,hypothyroidism,VSD, nonischemic cardiomyopathy, and essential hypertension.She has a past history of peptic ulcer disease.  Other surgeries  include abdominal hysterectomy.  Social history:Nondrinker, never smoked.   Family history:Reviewed   Review of systems:  Therapy states that she reports some dysphagia since hospitalization. Constitutional: No fever, significant weight change, fatigue  Eyes: No redness, discharge, pain, vision change ENT/mouth: No nasal congestion, purulent discharge, earache, change in hearing, sore throat  Cardiovascular: No chest pain, palpitations, paroxysmal nocturnal dyspnea, claudication, edema  Respiratory: No cough, sputum production, hemoptysis, DOE, significant snoring, apnea Gastrointestinal: No heartburn, abdominal pain, nausea /vomiting, rectal bleeding, melena, change in bowels. No diarrhea or bowel frequency off the high-dose colchicine. Genitourinary: No dysuria, hematuria, pyuria, incontinence, nocturia Musculoskeletal: No joint stiffness, joint swelling, weakness, pain Dermatologic: No rash, pruritus, change in appearance of skin Neurologic: No dizziness, headache, syncope, seizures, numbness, tingling Psychiatric: No significant anxiety, depression, insomnia, anorexia Endocrine: No change in hair/skin/nails, excessive thirst, excessive hunger, excessive urination  Hematologic/lymphatic: No significant bruising, lymphadenopathy, abnormal bleeding Allergy/immunology: No itchy/watery eyes, significant sneezing, urticaria, angioedema  Physical exam:  Pertinent or positive findings: She is alert and interactive. The right pupil is deformed and displaced. She has arcus senilis. She has minor rhonchi at the bases. Heart sounds are distant. Abdomen is protuberant. Her pedal pulses are decreased. The left ankle is wrapped. She symmetrically weak in the upper extremities.  General appearance: Adequately nourished; no acute distress, increased work of breathing is present.   Lymphatic: No lymphadenopathy about the head, neck, axilla. Eyes: No conjunctival inflammation or lid edema is present.  There is no scleral icterus. Ears:  External ear exam shows no significant lesions or deformities.   Nose:  External nasal examination shows no deformity or inflammation. Nasal mucosa are pink and moist without lesions, exudates Oral exam: Lips  and gums are healthy appearing.There is no oropharyngeal erythema or exudate. Neck:  No thyromegaly, masses, tenderness noted.    Heart:  No gallop, murmur, click, rub.  Lungs: without wheezes, rales, rubs. Abdomen: Bowel sounds are normal.  Abdomen is soft and nontender with no organomegaly, hernias, masses. GU: Deferred  Extremities:  No cyanosis, clubbing, edema. Neurologic exam:  Strength equal  in upper & lower extremities. Balance, Rhomberg, finger to nose testing could not be completed due to clinical state Deep tendon reflexes are equal Skin: Warm & dry w/o tenting. No significant lesions or rash.  See clinical summary under each active problem in the Problem List with associated updated therapeutic plan

## 2018-01-10 NOTE — Assessment & Plan Note (Addendum)
PT/OT at SNF [facility] F/U as scheduled

## 2018-01-10 NOTE — Assessment & Plan Note (Signed)
01/11/91 clinically compensated

## 2018-01-10 NOTE — Assessment & Plan Note (Deleted)
PT/OT at SNF °

## 2018-01-21 ENCOUNTER — Encounter (HOSPITAL_COMMUNITY): Payer: Medicare Other

## 2018-01-22 ENCOUNTER — Telehealth (INDEPENDENT_AMBULATORY_CARE_PROVIDER_SITE_OTHER): Payer: Self-pay | Admitting: Orthopedic Surgery

## 2018-01-22 ENCOUNTER — Encounter: Payer: Self-pay | Admitting: Internal Medicine

## 2018-01-22 DIAGNOSIS — R4189 Other symptoms and signs involving cognitive functions and awareness: Secondary | ICD-10-CM

## 2018-01-22 DIAGNOSIS — R29818 Other symptoms and signs involving the nervous system: Secondary | ICD-10-CM | POA: Insufficient documentation

## 2018-01-22 NOTE — Telephone Encounter (Signed)
Scheduled patient for Friday, 01/25/18 at 9:15am.  Is this okay?

## 2018-01-22 NOTE — Telephone Encounter (Signed)
Tried calling back to schedule post op appt for patient. No answer. LMVM for Casey [name] to call us back to get post op appt scheduled for patient. Patient will need to be added this week if possible since she is 2 weeks out from surgery. If she calls back, can you please schedule patient a post op appt when the transportation for patients facility can bring her? Thanks.

## 2018-01-22 NOTE — Telephone Encounter (Signed)
Hilda Blades from [facility] called needing to schedule post op appointment for the patient. Patient had surgery 01/04/18  The number to contact Hilda Blades   Is [phone]

## 2018-01-23 DIAGNOSIS — E668 Other obesity: Secondary | ICD-10-CM | POA: Diagnosis not present

## 2018-01-23 DIAGNOSIS — E039 Hypothyroidism, unspecified: Secondary | ICD-10-CM | POA: Diagnosis not present

## 2018-01-23 DIAGNOSIS — I472 Ventricular tachycardia: Secondary | ICD-10-CM | POA: Diagnosis not present

## 2018-01-23 DIAGNOSIS — R0600 Dyspnea, unspecified: Secondary | ICD-10-CM | POA: Diagnosis not present

## 2018-01-23 DIAGNOSIS — Z7901 Long term (current) use of anticoagulants: Secondary | ICD-10-CM | POA: Diagnosis not present

## 2018-01-23 DIAGNOSIS — Q210 Ventricular septal defect: Secondary | ICD-10-CM | POA: Diagnosis not present

## 2018-01-23 DIAGNOSIS — I428 Other cardiomyopathies: Secondary | ICD-10-CM | POA: Diagnosis not present

## 2018-01-23 DIAGNOSIS — N183 Chronic kidney disease, stage 3 (moderate): Secondary | ICD-10-CM | POA: Diagnosis not present

## 2018-01-23 DIAGNOSIS — R0602 Shortness of breath: Secondary | ICD-10-CM | POA: Diagnosis not present

## 2018-01-23 DIAGNOSIS — Z95810 Presence of automatic (implantable) cardiac defibrillator: Secondary | ICD-10-CM | POA: Diagnosis not present

## 2018-01-23 DIAGNOSIS — I5022 Chronic systolic (congestive) heart failure: Secondary | ICD-10-CM | POA: Diagnosis not present

## 2018-01-23 DIAGNOSIS — I700 Atherosclerosis of aorta: Secondary | ICD-10-CM | POA: Diagnosis not present

## 2018-01-25 ENCOUNTER — Encounter (INDEPENDENT_AMBULATORY_CARE_PROVIDER_SITE_OTHER): Payer: Self-pay | Admitting: Orthopedic Surgery

## 2018-01-25 ENCOUNTER — Ambulatory Visit (INDEPENDENT_AMBULATORY_CARE_PROVIDER_SITE_OTHER): Payer: PRIVATE HEALTH INSURANCE

## 2018-01-25 ENCOUNTER — Ambulatory Visit (INDEPENDENT_AMBULATORY_CARE_PROVIDER_SITE_OTHER): Payer: PRIVATE HEALTH INSURANCE | Admitting: Orthopedic Surgery

## 2018-01-25 DIAGNOSIS — S82892A Other fracture of left lower leg, initial encounter for closed fracture: Secondary | ICD-10-CM

## 2018-01-26 ENCOUNTER — Encounter (INDEPENDENT_AMBULATORY_CARE_PROVIDER_SITE_OTHER): Payer: Self-pay | Admitting: Orthopedic Surgery

## 2018-01-26 NOTE — Progress Notes (Signed)
Post-Op Visit Note   Patient: Casey [name]           Date of Birth: 03/16/1935           MRN: [id number] Visit Date: 01/25/2018 PCP: Elby Showers, MD   Assessment & Plan:  Chief Complaint:  Chief Complaint  Patient presents with  . Left Ankle - Routine Post Op   Visit Diagnoses:  1. Closed fracture of left ankle, initial encounter     Plan: Casey [name] is a patient is now about 2 weeks out left ankle fracture fixation.  On examination the incision is intact.  She does have a little bit of skin loss blister issues on the dorsal aspect of the foot but no active infection.  Radiographs look good.  I will put her in a fracture boot letter start to be touchdown weightbearing.  I do want her doing ankle range of motion exercises.  Okay to be out of the fracture boot when she is not walking.  Come back in 2 weeks for repeat radiographs and likely advancement of weightbearing at that time.  Bone quality was poor so we do not want to go too fast on the weightbearing.  Follow-Up Instructions: Return in about 2 weeks (around 02/08/2018).   Orders:  Orders Placed This Encounter  Procedures  . XR Ankle Complete Left   No orders of the defined types were placed in this encounter.   Imaging: No results found.  PMFS History: Patient Active Problem List   Diagnosis Date Noted  . Neurocognitive deficits 01/22/2018  . Closed displaced bimalleolar fracture of left lower leg   . History of peptic ulcer disease   . Atherosclerosis of aorta ([place])   . Bilateral hearing loss 12/09/2015  . Kidney disease, chronic, stage III (GFR 30-59 ml/min) (HCC)   . Ventricular septal defect (VSD)   . Long-term (current) use of anticoagulants   . Hypothyroidism 03/11/2013  . Gout   . Vitamin D deficiency 03/06/2011  . Glaucoma   . Anxiety   . Spinal stenosis   . Biventricular implantable cardioverter-defibrillator in situ   . Nonischemic cardiomyopathy ([place])   . Paroxysmal atrial fibrillation  (HCC)   . Hyperlipidemia   . Ventricular tachycardia   . Hypertensive heart disease   . Chronic systolic CHF (congestive heart failure) (HCC)    Past Medical History:  Diagnosis Date  . Atherosclerosis of aorta ([place])   . Biventricular ICD (implantable cardiac defibrillator) in place    medtronic   . Chronic kidney disease (CKD), stage III (moderate) (HCC)   . Chronic systolic heart failure ([place])   . Gout   . History of peptic ulcer disease   . Hypertension   . Hypothyroidism   . Lumbar disc disease   . Nonischemic cardiomyopathy ([place])   . Ventricular septal defect   . VT (ventricular tachycardia) ([place])    appropriate therapy via ICD 2013    Family History  Problem Relation Age of Onset  . Heart failure Mother   . Cancer Father     Past Surgical History:  Procedure Laterality Date  . ABDOMINAL HYSTERECTOMY    . BIV ICD GENERTAOR CHANGE OUT N/A 04/16/2013   Procedure: BIV ICD GENERTAOR CHANGE OUT;  Surgeon: Deboraha Sprang, MD;  Location: [facility] CATH LAB;  Service: Cardiovascular;  Laterality: N/A;  . CESAREAN SECTION    . FEMUR IM NAIL    . ORIF ANKLE FRACTURE Left 01/04/2018   Procedure: OPEN REDUCTION INTERNAL  FIXATION (ORIF) ANKLE FRACTURE;  Surgeon: Meredith Pel, MD;  Location: [facility];  Service: Orthopedics;  Laterality: Left;  . TEE WITHOUT CARDIOVERSION N/A 10/31/2017   Procedure: TRANSESOPHAGEAL ECHOCARDIOGRAM (TEE);  Surgeon: Larey Dresser, MD;  Location: [facility] ENDOSCOPY;  Service: Cardiovascular;  Laterality: N/A;   Social History   Occupational History  . Not on file  Tobacco Use  . Smoking status: Never Smoker  . Smokeless tobacco: Never Used  Substance and Sexual Activity  . Alcohol use: No  . Drug use: No  . Sexual activity: Not on file

## 2018-02-13 ENCOUNTER — Ambulatory Visit (INDEPENDENT_AMBULATORY_CARE_PROVIDER_SITE_OTHER): Payer: PRIVATE HEALTH INSURANCE | Admitting: Orthopedic Surgery

## 2018-02-13 ENCOUNTER — Encounter (INDEPENDENT_AMBULATORY_CARE_PROVIDER_SITE_OTHER): Payer: Self-pay | Admitting: Orthopedic Surgery

## 2018-02-13 ENCOUNTER — Ambulatory Visit (INDEPENDENT_AMBULATORY_CARE_PROVIDER_SITE_OTHER): Payer: PRIVATE HEALTH INSURANCE

## 2018-02-13 DIAGNOSIS — S82892A Other fracture of left lower leg, initial encounter for closed fracture: Secondary | ICD-10-CM

## 2018-02-14 ENCOUNTER — Non-Acute Institutional Stay (SKILLED_NURSING_FACILITY): Payer: Medicare Other | Admitting: Internal Medicine

## 2018-02-14 ENCOUNTER — Encounter: Payer: Self-pay | Admitting: Internal Medicine

## 2018-02-14 DIAGNOSIS — I110 Hypertensive heart disease with heart failure: Secondary | ICD-10-CM | POA: Diagnosis not present

## 2018-02-14 DIAGNOSIS — H8112 Benign paroxysmal vertigo, left ear: Secondary | ICD-10-CM | POA: Diagnosis not present

## 2018-02-14 DIAGNOSIS — I5020 Unspecified systolic (congestive) heart failure: Secondary | ICD-10-CM | POA: Diagnosis not present

## 2018-02-14 NOTE — Patient Instructions (Signed)
See assessment and plan under each diagnosis in the problem list and acutely for this visit 

## 2018-02-14 NOTE — Progress Notes (Signed)
NURSING HOME LOCATION:  [facility] ROOM NUMBER:  [id number]  CODE STATUS:  Full Code  PCP:  Elby Showers, MD  [id number] [id number]   This is a nursing facility acute visit for dizziness.  Interim medical record and care since last [place] visit was updated with review of diagnostic studies and change in clinical status since last visit were documented.  HPI: Staff reports she is complaining of intermittent dizziness.  Blood pressure diary was reviewed.  Blood pressure range has been 98/57 up to 110/72.  Today's blood pressure is 93/58.  She is on a beta-blocker, hydralazine, nitrate, and diuretic.  Despite the low blood pressures, the patient denies any postural hypotension symptoms. She states that she becomes dizzy when she is supine in bed and rolls into the left lateral decubitus position. With this she describes frank vertigo.  She denies any cardiac or neurologic prodrome to these events.  She describes a sensation of intermittent "water in my ears".  She has had occasional tinnitus for years.  She also describes variable hearing or auditory deficiency.  She states that her hearing acuity depends on the individual's voice.  She notices difference in hearing with the change of volume of the TV setting. She was treated with meclizine by her PCP over a year ago for "dizziness".  The patient was admitted to the SNF after hospitalization 7/23-7/31 for acute left ankle fracture sustained a mechanical fall.  She states she became dizzy after sitting on the commode with her head flexed forward and then ambulating from the bathroom.  She denied any cardiac or neurologic prodrome prior to that fall.  Elevated INR delayed the surgery until 7/26 by Dr. Marlou Sa.  Discharged to the SNF for PT/OT. Orthopedic follow-up was 9/4 and Silvadene dressing daily and compression hose for the left foot were recommended.  With ambulation she was to employ a fracture boot.  She  will have a follow-up orthopedic visit 9/18 with Dr. Sharol Given.  PMH of PAF, chronic systolic heart failure, and defibrillator and pacemaker placement. She has chronic kidney disease stage IV, hypothyroidism, and history of peptic ulcer disease.   Last labs were 7/31.  Review of systems: She describes herself as "stressed out" and depressed.  She is anxious to leave the facility and "get back to my own doctors".  PT/OT states that she walks with a walker but tires easily typically after 15 yards.  The patient denied any exertional dyspnea but simply fatigue.  Constitutional: No fever, significant weight change  Eyes: No redness, discharge, pain, vision change ENT/mouth: No nasal congestion,  purulent discharge, sore throat  Cardiovascular: No chest pain, palpitations, paroxysmal nocturnal dyspnea, claudication, edema  Respiratory: No cough, sputum production, hemoptysis   Gastrointestinal: No heartburn, dysphagia, abdominal pain, nausea /vomiting, rectal bleeding, melena, change in bowels Genitourinary: No dysuria, hematuria, pyuria, incontinence, nocturia Neurologic: No headache, syncope, seizures, numbness, tingling Hematologic/lymphatic: No significant bruising, lymphadenopathy, abnormal bleeding Allergy/immunology: No itchy/watery eyes, significant sneezing, urticaria, angioedema  Physical exam:  Pertinent or positive findings: She is hard of hearing.  There is decreased hearing to whisper bilaterally. Minimal wax in the left canal.  Tympanic membranes are dull. She has minor diffuse low-grade rales without increased work of breathing.  1/2+ edema is present at the right ankle.  The left leg is in a walking boot.  The right pedal pulses are decreased.  Lower extremity is weaker than the right.  There is callus over the right  knee.  General appearance: Adequately nourished; no acute distress Lymphatic: No lymphadenopathy about the head, neck, axilla. Eyes: No conjunctival inflammation or lid  edema is present. There is no scleral icterus. Ears:  External ear exam shows no significant lesions or deformities.   Nose:  External nasal examination shows no deformity or inflammation. Nasal mucosa are pink and moist without lesions, exudates Oral exam:  Lips and gums are healthy appearing. There is no oropharyngeal erythema or exudate. Neck:  No thyromegaly, masses, tenderness noted.    Heart:  Normal rate and regular rhythm. S1 and S2 normal without gallop, murmur, click, rub .  Lungs:  without wheezes, rhonchi, rubs. Abdomen: Bowel sounds are normal. Abdomen is soft and nontender with no organomegaly, hernias, masses. GU: Deferred  Extremities:  No cyanosis, clubbing  Neurologic exam : Balance, Rhomberg, finger to nose testing could not be completed due to clinical state Skin: Warm & dry w/o tenting. No significant  rash.  See summary under each active problem in the Problem List with associated updated therapeutic plan

## 2018-02-14 NOTE — Assessment & Plan Note (Signed)
02/14/2018 blood pressure diaries have consistently revealed hypotension which is subjectively asymptomatic. Hydralazine 25 mg will be reduced from 3 times daily to every 12 hours

## 2018-02-14 NOTE — Assessment & Plan Note (Addendum)
Positional interventions by OT at the SNF if possible Issue of hearing loss will require ENT evaluation Notify PCP

## 2018-02-15 ENCOUNTER — Encounter (INDEPENDENT_AMBULATORY_CARE_PROVIDER_SITE_OTHER): Payer: Self-pay | Admitting: Orthopedic Surgery

## 2018-02-15 NOTE — Progress Notes (Signed)
Post-Op Visit Note   Patient: Casey [name]           Date of Birth: 07/17/1934           MRN: [id number] Visit Date: 02/13/2018 PCP: Elby Showers, MD   Assessment & Plan:  Chief Complaint:  Chief Complaint  Patient presents with  . Left Ankle - Routine Post Op   Visit Diagnoses:  1. Closed fracture of left ankle, initial encounter     Plan: Patient presents now about 6 weeks out left ankle fracture fixation.  She is having a little bit of skin problem on the dorsal aspect of the foot.  X-rays look good on the fracture and we can let her definitely start weightbearing on that ankle.  Need her to start doing some ankle range of motion exercises as well.  The incisions generally look intact but this area on the dorsal aspect of her foot is having some issues healing and looks like it is getting a little bit bigger in size.  I like to put Silvadene dressing on that and have Dr. Sharol Given follow-up on this in 2 weeks.  I did have Erin's nurse practitioner look at the foot and she agreed that further intervention is indicated.  Follow-Up Instructions: No follow-ups on file.   Orders:  Orders Placed This Encounter  Procedures  . XR Ankle Complete Left   No orders of the defined types were placed in this encounter.   Imaging: No results found.  PMFS History: Patient Active Problem List   Diagnosis Date Noted  . Benign paroxysmal positional vertigo of left ear 02/14/2018  . Neurocognitive deficits 01/22/2018  . Closed displaced bimalleolar fracture of left lower leg   . History of peptic ulcer disease   . Atherosclerosis of aorta ([place])   . Bilateral hearing loss 12/09/2015  . Kidney disease, chronic, stage III (GFR 30-59 ml/min) (HCC)   . Ventricular septal defect (VSD)   . Long-term (current) use of anticoagulants   . Hypothyroidism 03/11/2013  . Gout   . Vitamin D deficiency 03/06/2011  . Glaucoma   . Anxiety   . Spinal stenosis   . Biventricular implantable  cardioverter-defibrillator in situ   . Nonischemic cardiomyopathy ([place])   . Paroxysmal atrial fibrillation (HCC)   . Hyperlipidemia   . Ventricular tachycardia   . Hypertensive heart disease   . Chronic systolic CHF (congestive heart failure) (HCC)    Past Medical History:  Diagnosis Date  . Atherosclerosis of aorta ([place])   . Biventricular ICD (implantable cardiac defibrillator) in place    medtronic   . Chronic kidney disease (CKD), stage III (moderate) (HCC)   . Chronic systolic heart failure ([place])   . Gout   . History of peptic ulcer disease   . Hypertension   . Hypothyroidism   . Lumbar disc disease   . Nonischemic cardiomyopathy ([place])   . Ventricular septal defect   . VT (ventricular tachycardia) ([place])    appropriate therapy via ICD 2013    Family History  Problem Relation Age of Onset  . Heart failure Mother   . Cancer Father     Past Surgical History:  Procedure Laterality Date  . ABDOMINAL HYSTERECTOMY    . BIV ICD GENERTAOR CHANGE OUT N/A 04/16/2013   Procedure: BIV ICD GENERTAOR CHANGE OUT;  Surgeon: Deboraha Sprang, MD;  Location[facility] CATH LAB;  Service: Cardiovascular;  Laterality: N/A;  . CESAREAN SECTION    . FEMUR IM  NAIL    . ORIF ANKLE FRACTURE Left 01/04/2018   Procedure: OPEN REDUCTION INTERNAL FIXATION (ORIF) ANKLE FRACTURE;  Surgeon: Meredith Pel, MD;  Location: [facility];  Service: Orthopedics;  Laterality: Left;  . TEE WITHOUT CARDIOVERSION N/A 10/31/2017   Procedure: TRANSESOPHAGEAL ECHOCARDIOGRAM (TEE);  Surgeon: Larey Dresser, MD;  Location[facility] ENDOSCOPY;  Service: Cardiovascular;  Laterality: N/A;   Social History   Occupational History  . Not on file  Tobacco Use  . Smoking status: Never Smoker  . Smokeless tobacco: Never Used  Substance and Sexual Activity  . Alcohol use: No  . Drug use: No  . Sexual activity: Not on file

## 2018-02-18 LAB — PROTIME-INR: PROTIME: 30.7 — AB (ref 10.0–13.8)

## 2018-02-18 LAB — POCT INR: INR: 3 — AB (ref <=1.1)

## 2018-02-20 LAB — POCT INR: INR: 3.4 — AB (ref 0.9–1.1)

## 2018-02-20 LAB — PROTIME-INR: PROTIME: 33.2 — AB (ref 10.0–13.8)

## 2018-02-21 ENCOUNTER — Encounter (HOSPITAL_COMMUNITY): Payer: Medicare Other

## 2018-02-26 DIAGNOSIS — R531 Weakness: Secondary | ICD-10-CM | POA: Diagnosis not present

## 2018-02-26 DIAGNOSIS — Z1331 Encounter for screening for depression: Secondary | ICD-10-CM | POA: Diagnosis not present

## 2018-02-27 ENCOUNTER — Encounter (INDEPENDENT_AMBULATORY_CARE_PROVIDER_SITE_OTHER): Payer: Self-pay | Admitting: Orthopedic Surgery

## 2018-02-27 ENCOUNTER — Ambulatory Visit (INDEPENDENT_AMBULATORY_CARE_PROVIDER_SITE_OTHER): Payer: Medicare Other | Admitting: Orthopedic Surgery

## 2018-02-27 VITALS — Ht 62.0 in | Wt 162.2 lb

## 2018-02-27 DIAGNOSIS — L97311 Non-pressure chronic ulcer of right ankle limited to breakdown of skin: Secondary | ICD-10-CM

## 2018-02-27 NOTE — Progress Notes (Signed)
Office Visit Note   Patient: Casey [name]           Date of Birth: [birth year]           MRN: [id number] Visit Date: 02/27/2018              Requested by: Elby Showers, MD [address] [place], [place] [address] PCP: Elby Showers, MD  Chief Complaint  Patient presents with  . Left Foot - Wound Check, Follow-up      HPI: Patient is an 55 woman who presents today in follow-up for ulceration to her left ankle.  She is about 7 weeks out from a left ankle fracture.  She is residing at [facility] for her rehabilitation.  Has been full weightbearing in a cam walker for the last week feels well.  States she would like to return home.  States she would like to progress out of her cam walker.  Has been having Silvadene dressing changes for an dorsal ankle ulcer which appeared to occur from pressure from her cam boot.  Much improved since last week.  Assessment & Plan: Visit Diagnoses: No diagnosis found.  Plan: Continue Daily wound cleansing with Dial soap..  Silvadene dressings daily.  May progress to regular shoewear.  Weightbearing as tolerated.  Anticipate discharge home prior to next follow-up.  Follow-up in the office in 2 weeks for clinical evaluation.  Follow-Up Instructions: No follow-ups on file.   Ortho Exam  Patient is alert, oriented, no adenopathy, well-dressed, normal affect, normal respiratory effort. The ulceration to her left ankle is much improved is now 4 cm x 2 cm at its widest  100% exudative tissue.  However it is epithelializing well.  Markedly improved since last week.  There is scant bloody drainage.  There is no erythema no warmth no sign of infection  Imaging: No results found. No images are attached to the encounter.  Labs: Lab Results  Component Value Date   HGBA1C 5.5 06/13/2016   HGBA1C 5.8 (H) 12/07/2015   HGBA1C 5.8 (H) 06/08/2015   LABURIC 11.5 (H) 12/21/2017   LABURIC 8.0 (H) 07/18/2011     Lab Results  Component Value  Date   ALBUMIN 4.4 12/21/2017   ALBUMIN 3.7 09/11/2017   ALBUMIN 3.7 07/06/2017   LABURIC 11.5 (H) 12/21/2017   LABURIC 8.0 (H) 07/18/2011    Body mass index is 29.67 kg/m.  Orders:  No orders of the defined types were placed in this encounter.  No orders of the defined types were placed in this encounter.    Procedures: No procedures performed  Clinical Data: No additional findings.  ROS:  All other systems negative, except as noted in the HPI. Review of Systems  Constitutional: Negative for chills and fever.  Cardiovascular: Negative for leg swelling.  Skin: Positive for wound.    Objective: Vital Signs: Ht 5\' 2"  (1.575 m)   Wt 162 lb 3.2 oz (73.6 kg)   BMI 29.67 kg/m   Specialty Comments:  No specialty comments available.  PMFS History: Patient Active Problem List   Diagnosis Date Noted  . Benign paroxysmal positional vertigo of left ear 02/14/2018  . Neurocognitive deficits 01/22/2018  . Closed displaced bimalleolar fracture of left lower leg   . History of peptic ulcer disease   . Atherosclerosis of aorta ([place])   . Bilateral hearing loss 12/09/2015  . Kidney disease, chronic, stage III (GFR 30-59 ml/min) (HCC)   . Ventricular septal defect (VSD)   .  Long-term (current) use of anticoagulants   . Hypothyroidism 03/11/2013  . Gout   . Vitamin D deficiency 03/06/2011  . Glaucoma   . Anxiety   . Spinal stenosis   . Biventricular implantable cardioverter-defibrillator in situ   . Nonischemic cardiomyopathy ([place])   . Paroxysmal atrial fibrillation (HCC)   . Hyperlipidemia   . Ventricular tachycardia   . Hypertensive heart disease   . Chronic systolic CHF (congestive heart failure) (HCC)    Past Medical History:  Diagnosis Date  . Atherosclerosis of aorta ([place])   . Biventricular ICD (implantable cardiac defibrillator) in place    medtronic   . Chronic kidney disease (CKD), stage III (moderate) (HCC)   . Chronic systolic heart failure ([place])   .  Gout   . History of peptic ulcer disease   . Hypertension   . Hypothyroidism   . Lumbar disc disease   . Nonischemic cardiomyopathy ([place])   . Ventricular septal defect   . VT (ventricular tachycardia) ([place])    appropriate therapy via ICD 2013    Family History  Problem Relation Age of Onset  . Heart failure Mother   . Cancer Father     Past Surgical History:  Procedure Laterality Date  . ABDOMINAL HYSTERECTOMY    . BIV ICD GENERTAOR CHANGE OUT N/A 04/16/2013   Procedure: BIV ICD GENERTAOR CHANGE OUT;  Surgeon: Deboraha Sprang, MD;  Location: [facility] CATH LAB;  Service: Cardiovascular;  Laterality: N/A;  . CESAREAN SECTION    . FEMUR IM NAIL    . ORIF ANKLE FRACTURE Left 01/04/2018   Procedure: OPEN REDUCTION INTERNAL FIXATION (ORIF) ANKLE FRACTURE;  Surgeon: Meredith Pel, MD;  Location: [facility];  Service: Orthopedics;  Laterality: Left;  . TEE WITHOUT CARDIOVERSION N/A 10/31/2017   Procedure: TRANSESOPHAGEAL ECHOCARDIOGRAM (TEE);  Surgeon: Larey Dresser, MD;  Location[facility] ENDOSCOPY;  Service: Cardiovascular;  Laterality: N/A;   Social History   Occupational History  . Not on file  Tobacco Use  . Smoking status: Never Smoker  . Smokeless tobacco: Never Used  Substance and Sexual Activity  . Alcohol use: No  . Drug use: No  . Sexual activity: Not on file

## 2018-03-01 ENCOUNTER — Non-Acute Institutional Stay (SKILLED_NURSING_FACILITY): Payer: Medicare Other | Admitting: Adult Health

## 2018-03-01 ENCOUNTER — Encounter: Payer: Self-pay | Admitting: Adult Health

## 2018-03-01 DIAGNOSIS — E039 Hypothyroidism, unspecified: Secondary | ICD-10-CM

## 2018-03-01 DIAGNOSIS — I700 Atherosclerosis of aorta: Secondary | ICD-10-CM

## 2018-03-01 DIAGNOSIS — H8112 Benign paroxysmal vertigo, left ear: Secondary | ICD-10-CM | POA: Diagnosis not present

## 2018-03-01 DIAGNOSIS — I5022 Chronic systolic (congestive) heart failure: Secondary | ICD-10-CM | POA: Diagnosis not present

## 2018-03-01 DIAGNOSIS — M109 Gout, unspecified: Secondary | ICD-10-CM

## 2018-03-01 DIAGNOSIS — S82842S Displaced bimalleolar fracture of left lower leg, sequela: Secondary | ICD-10-CM

## 2018-03-01 LAB — POCT INR: INR: 3 — AB (ref 0.9–1.1)

## 2018-03-01 MED ORDER — HYDRALAZINE HCL 25 MG PO TABS: 25.0000 mg | ORAL_TABLET | Freq: Two times a day (BID) | ORAL | 0 refills | Status: DC

## 2018-03-01 MED ORDER — DORZOLAMIDE HCL 2 % OP SOLN: 1.0000 [drp] | Freq: Three times a day (TID) | OPHTHALMIC | 0 refills | Status: DC

## 2018-03-01 MED ORDER — TORSEMIDE 20 MG PO TABS: 60.0000 mg | ORAL_TABLET | Freq: Every day | ORAL | 0 refills | Status: DC

## 2018-03-01 MED ORDER — WARFARIN SODIUM 2 MG PO TABS: 2.0000 mg | ORAL_TABLET | Freq: Every day | ORAL | 0 refills | Status: DC

## 2018-03-01 MED ORDER — LATANOPROST 0.005 % OP EMUL: 1.0000 [drp] | Freq: Every day | OPHTHALMIC | 0 refills | Status: DC

## 2018-03-01 MED ORDER — LEVOTHYROXINE SODIUM 75 MCG PO TABS: 75.0000 ug | ORAL_TABLET | Freq: Every day | ORAL | 0 refills | Status: DC

## 2018-03-01 MED ORDER — COLCHICINE 0.6 MG PO TABS: 0.6000 mg | ORAL_TABLET | Freq: Every day | ORAL | 0 refills | Status: DC | PRN

## 2018-03-01 MED ORDER — ISOSORBIDE MONONITRATE ER 30 MG PO TB24: 30.0000 mg | ORAL_TABLET | Freq: Every day | ORAL | 0 refills | Status: DC

## 2018-03-01 MED ORDER — METOPROLOL SUCCINATE ER 50 MG PO TB24: 50.0000 mg | ORAL_TABLET | Freq: Every day | ORAL | 0 refills | Status: DC

## 2018-03-01 NOTE — Progress Notes (Signed)
Location:  [place] Room Number: 216-A Place of Service:  SNF (31) Provider:  Durenda Age, NP  Patient Care Team: Elby Showers, MD as PCP - General (Internal Medicine) Jacolyn Reedy, MD as PCP - Cardiology (Cardiology) Jacolyn Reedy, MD as Consulting Physician (Cardiology)  Extended Emergency Contact Information Primary Emergency Contact: Rip Harbour Mobile Phone: [phone] Relation: Friend Secondary Emergency Contact: Rollene Fare Mobile Phone: [phone] Relation: Daughter  Code Status:  Full Code  Goals of care: Advanced Directive information Advanced Directives 01/10/2018  Does Patient Have a Medical Advance Directive? No  Type of Advance Directive -  Does patient want to make changes to medical advance directive? -  Copy of [place] in Chart? -  Would patient like information on creating a medical advance directive? -  Pre-existing out of facility DNR order (yellow form or pink MOST form) -     Chief Complaint  Patient presents with  . Discharge Note    Patient is seen for discharge, discharging 03/03/18    HPI:  Pt is an 83 y.o. female seen today for a discharge visit.  She is to discharge home on 03/03/18 with home health PT, OT, nursing for wound care and medication management for Coumadin.    She has been admitted to [facility] on 01/09/18 from a recent hospitalization S/P fall sustaining left ankle fracture. She had left ankle repair on 01/04/18. She has a PMH of CKD stage IV, hypothyroidism, VSD, nonischemic cardiomyopathy, and essential hypertension.    Patient was admitted to this facility for short-term rehabilitation after the patient's recent hospitalization.  Patient has completed SNF rehabilitation and therapy has cleared the patient for discharge.   Past Medical History:  Diagnosis Date  . Atherosclerosis of aorta ([place])   . Biventricular ICD (implantable  cardiac defibrillator) in place    medtronic   . Chronic kidney disease (CKD), stage III (moderate) (HCC)   . Chronic systolic heart failure ([place])   . Gout   . History of peptic ulcer disease   . Hypertension   . Hypothyroidism   . Lumbar disc disease   . Nonischemic cardiomyopathy ([place])   . Ventricular septal defect   . VT (ventricular tachycardia) ([place])    appropriate therapy via ICD 2013   Past Surgical History:  Procedure Laterality Date  . ABDOMINAL HYSTERECTOMY    . BIV ICD GENERTAOR CHANGE OUT N/A 04/16/2013   Procedure: BIV ICD GENERTAOR CHANGE OUT;  Surgeon: Deboraha Sprang, MD;  Location: [facility] CATH LAB;  Service: Cardiovascular;  Laterality: N/A;  . CESAREAN SECTION    . FEMUR IM NAIL    . ORIF ANKLE FRACTURE Left 01/04/2018   Procedure: OPEN REDUCTION INTERNAL FIXATION (ORIF) ANKLE FRACTURE;  Surgeon: Meredith Pel, MD;  Location: [facility];  Service: Orthopedics;  Laterality: Left;  . TEE WITHOUT CARDIOVERSION N/A 10/31/2017   Procedure: TRANSESOPHAGEAL ECHOCARDIOGRAM (TEE);  Surgeon: Larey Dresser, MD;  Location: [facility] ENDOSCOPY;  Service: Cardiovascular;  Laterality: N/A;    Allergies  Allergen Reactions  . Codeine Rash    Outpatient Encounter Medications as of 03/01/2018  Medication Sig  . colchicine 0.6 MG tablet Take 1 tablet (0.6 mg total) by mouth daily as needed.  . dorzolamide (TRUSOPT) 2 % ophthalmic solution Place 1 drop into both eyes 3 (three) times daily.  . hydrALAZINE (APRESOLINE) 25 MG tablet Take 1 tablet (25 mg total) by mouth every 12 (twelve) hours.  . isosorbide  mononitrate (IMDUR) 30 MG 24 hr tablet Take 1 tablet (30 mg total) by mouth daily.  . Latanoprost 0.005 % EMUL Place 1 drop into both eyes at bedtime.  Marland Kitchen levothyroxine (SYNTHROID, LEVOTHROID) 75 MCG tablet Take 1 tablet (75 mcg total) by mouth daily before breakfast.  . metoprolol succinate (TOPROL-XL) 50 MG 24 hr tablet Take 1 tablet (50 mg total) by mouth daily. Take with or immediately  following a meal.  . Multiple Vitamins-Calcium (ONE-A-DAY WOMENS FORMULA) TABS Take 1 tablet by mouth daily.    . polyethylene glycol (MIRALAX / GLYCOLAX) packet Take 17 g by mouth daily.  . potassium chloride SA (K-DUR,KLOR-CON) 20 MEQ tablet Take 1 tablet (20 mEq total) by mouth daily.  Marland Kitchen torsemide (DEMADEX) 20 MG tablet Take 3 tablets (60 mg total) by mouth daily.  Marland Kitchen warfarin (COUMADIN) 2 MG tablet Take 1 tablet (2 mg total) by mouth daily.  . [DISCONTINUED] colchicine 0.6 MG tablet Take 1 tablet (0.6 mg total) by mouth daily as needed.  . [DISCONTINUED] dorzolamide (TRUSOPT) 2 % ophthalmic solution Place 1 drop into both eyes 3 (three) times daily.   . [DISCONTINUED] hydrALAZINE (APRESOLINE) 25 MG tablet Take 25 mg by mouth every 12 (twelve) hours.  . [DISCONTINUED] isosorbide mononitrate (IMDUR) 30 MG 24 hr tablet Take 1 tablet (30 mg total) by mouth daily.  . [DISCONTINUED] Latanoprost 0.005 % EMUL Place 1 drop into both eyes at bedtime.   . [DISCONTINUED] levothyroxine (SYNTHROID, LEVOTHROID) 75 MCG tablet Take 75 mcg by mouth daily before breakfast.   . [DISCONTINUED] metoprolol succinate (TOPROL-XL) 50 MG 24 hr tablet Take 1 tablet (50 mg total) by mouth daily. Take with or immediately following a meal.  . [DISCONTINUED] torsemide (DEMADEX) 20 MG tablet Take 3 tablets (60 mg total) by mouth daily.  . [DISCONTINUED] warfarin (COUMADIN) 2 MG tablet Take 2 mg by mouth daily.  . [DISCONTINUED] hydrALAZINE (APRESOLINE) 25 MG tablet Take 1 tablet (25 mg total) by mouth every 8 (eight) hours.  . [DISCONTINUED] Warfarin Sodium (COUMADIN PO) Take 2.5-5 mg by mouth See admin instructions. 2.5 mg M-W-F-Sun and 5 mg Tue-Thu-Sat   No facility-administered encounter medications on file as of 03/01/2018.     Review of Systems  GENERAL: No change in appetite, no fatigue, no weight changes, no fever, chills or weakness MOUTH and THROAT: Denies oral discomfort, gingival pain or bleeding RESPIRATORY:  no cough, SOB, DOE, wheezing, hemoptysis CARDIAC: No chest pain, edema or palpitations GI: No abdominal pain, diarrhea, constipation, heart burn, nausea or vomiting GU: Denies dysuria, frequency, hematuria, incontinence, or discharge PSYCHIATRIC: Denies feelings of depression or anxiety. No report of hallucinations, insomnia, paranoia, or agitation   Immunization History  Administered Date(s) Administered  . Influenza Split 03/06/2011, 03/12/2012  . Influenza Whole 03/12/2008  . Influenza, High Dose Seasonal PF 03/26/2017  . Influenza,inj,Quad PF,6+ Mos 03/06/2013, 03/25/2014, 02/18/2015, 02/21/2016  . Pneumococcal Conjugate-13 06/02/2014  . Pneumococcal Polysaccharide-23 08/10/2009  . Tdap 08/10/2009   Pertinent  Health Maintenance Due  Topic Date Due  . DEXA SCAN  12/23/2001  . HEMOGLOBIN A1C  12/11/2016  . INFLUENZA VACCINE  05/01/2018 (Originally 01/10/2018)  . OPHTHALMOLOGY EXAM  07/18/2018  . PNA vac Low Risk Adult  Completed  . FOOT EXAM  Discontinued   Fall Risk  01/26/2017 06/11/2015 09/10/2014 08/10/2013 05/01/2013  Falls in the past year? Yes Yes No No No  Number falls in past yr: 2 or more - - - -  Injury with Fall? No - - - -  Risk for fall due to : - Impaired balance/gait - - -     Vitals:   03/01/18 0924  BP: 105/77  Pulse: 75  Resp: 18  Temp: 97.7 F (36.5 C)  TempSrc: Oral  SpO2: 94%  Weight: 166 lb 6.4 oz (75.5 kg)  Height: 5\' 2"  (1.575 m)   Body mass index is 30.43 kg/m.  Physical Exam  GENERAL APPEARANCE: Well nourished. In no acute distress. Obese SKIN:  Left foot covered with dressing MOUTH and THROAT: Lips are without lesions. Oral mucosa is moist and without lesions. Tongue is normal in shape, size, and color and without lesions RESPIRATORY: Breathing is even & unlabored, BS CTAB CARDIAC: RRR, no murmur,no extra heart sounds, no edema, left chest pacemaker GI: Abdomen soft, normal BS, no masses, no tenderness EXTREMITIES:  Able to move X 4  extremities PSYCHIATRIC: Alert and oriented X 3. Affect and behavior are appropriate   Labs reviewed: Recent Labs    01/07/18 0555 01/08/18 0439 01/09/18 0426  NA 139 137 141  K 4.5 4.2 4.1  CL 103 106 109  CO2 24 23 23   GLUCOSE 99 111* 110*  BUN 60* 62* 58*  CREATININE 3.11* 3.12* 2.68*  CALCIUM 9.6 9.3 9.5   Recent Labs    07/06/17 1900 09/11/17 1142 12/21/17 1109  AST 28 23 24   ALT 23 18 16   ALKPHOS 122 116 109  BILITOT 2.2* 1.3* 1.6*  PROT 6.1* 6.6 7.6  ALBUMIN 3.7 3.7 4.4   Recent Labs    07/23/17 1217 10/19/17 1202 01/01/18 2004  01/06/18 0408 01/07/18 0555 01/09/18 0426  WBC 5.0 5.9 5.7   < > 7.4 6.8 6.4  NEUTROABS 3,500 3,729 3.7  --   --   --   --   HGB 15.3 14.8 14.1   < > 12.7 12.4 12.3  HCT 45.0 42.2 44.6   < > 39.1 38.9 38.2  MCV 101.4* 97.9 107.0*   < > 104.3* 105.4* 103.8*  PLT 175 180 118*   < > 80* 81* 99*   < > = values in this interval not displayed.   Lab Results  Component Value Date   TSH 0.706 12/21/2017   Lab Results  Component Value Date   HGBA1C 5.5 06/13/2016   Lab Results  Component Value Date   CHOL 235 (H) 01/26/2017   HDL 35 (L) 01/26/2017   LDLCALC 164 (H) 01/26/2017   TRIG 179 (H) 01/26/2017   CHOLHDL 6.7 (H) 01/26/2017    Significant Diagnostic Results in last 30 days:  Xr Ankle Complete Left  Result Date: 02/15/2018 AP lateral mortise left ankle reviewed.  Hardware in good position alignment with symmetry of the mortise.  No complicating features   Assessment/Plan  1. Closed displaced bimalleolar fracture of left ankle, sequela - S/P repair on 01/04/18, for Home health PT and OT for therapeutic strengthening exercises, fall precautions, follow-up with orthopedics   2. Atherosclerosis of aorta (HCC) - no complaints of chest pain - isosorbide mononitrate (IMDUR) 30 MG 24 hr tablet; Take 1 tablet (30 mg total) by mouth daily.  Dispense: 30 tablet; Refill: 0  3. Benign paroxysmal positional vertigo of left ear  - rate controlled - metoprolol succinate (TOPROL-XL) 50 MG 24 hr tablet; Take 1 tablet (50 mg total) by mouth daily. Take with or immediately following a meal.  Dispense: 30 tablet; Refill: 0 - warfarin (COUMADIN) 2 MG tablet; Take 1 tablet (2 mg total) by mouth daily.  Dispense: 14  tablet; Refill: 0  4. Gout, unspecified cause, unspecified chronicity, unspecified site - colchicine 0.6 MG tablet; Take 1 tablet (0.6 mg total) by mouth daily as needed.  Dispense: 30 tablet; Refill: 0  5. Chronic systolic CHF (congestive heart failure) (HCC) -no SOB - hydrALAZINE (APRESOLINE) 25 MG tablet; Take 1 tablet (25 mg total) by mouth every 12 (twelve) hours.  Dispense: 60 tablet; Refill: 0 - metoprolol succinate (TOPROL-XL) 50 MG 24 hr tablet; Take 1 tablet (50 mg total) by mouth daily. Take with or immediately following a meal.  Dispense: 30 tablet; Refill: 0 - torsemide (DEMADEX) 20 MG tablet; Take 3 tablets (60 mg total) by mouth daily.  Dispense: 90 tablet; Refill: 0  6. Hypothyroidism, unspecified type - levothyroxine (SYNTHROID, LEVOTHROID) 75 MCG tablet; Take 1 tablet (75 mcg total) by mouth daily before breakfast.  Dispense: 30 tablet; Refill: 0 Lab Results  Component Value Date   TSH 0.706 12/21/2017      I have filled out patient's discharge paperwork and written prescriptions.  Patient will receive home health PT, OT, and Nursing.  DME provided: 3-in-1 commode  Total discharge time: Greater than 30 minutes Greater than 50% was spent in counseling and coordination of care.   Discharge time involved coordination of the discharge process with social worker, nursing staff and therapy department. Medical justification for home health services/DME verified.   Durenda Age, NP [facility] [phone] (Monday-Friday 8:00 a.m. - 5:00 p.m.) [phone] (after hours)

## 2018-03-12 ENCOUNTER — Encounter (INDEPENDENT_AMBULATORY_CARE_PROVIDER_SITE_OTHER): Payer: Self-pay | Admitting: Orthopedic Surgery

## 2018-03-12 ENCOUNTER — Ambulatory Visit (INDEPENDENT_AMBULATORY_CARE_PROVIDER_SITE_OTHER): Payer: Medicare Other | Admitting: Physician Assistant

## 2018-03-12 VITALS — Ht 62.0 in | Wt 166.0 lb

## 2018-03-12 DIAGNOSIS — L97521 Non-pressure chronic ulcer of other part of left foot limited to breakdown of skin: Secondary | ICD-10-CM

## 2018-03-12 DIAGNOSIS — S82892D Other fracture of left lower leg, subsequent encounter for closed fracture with routine healing: Secondary | ICD-10-CM

## 2018-03-13 ENCOUNTER — Encounter (INDEPENDENT_AMBULATORY_CARE_PROVIDER_SITE_OTHER): Payer: Self-pay | Admitting: Physician Assistant

## 2018-03-13 NOTE — Progress Notes (Signed)
Office Visit Note   Patient: Casey [name]           Date of Birth: Oct 17, 1934           MRN: [id number] Visit Date: 03/12/2018              Requested by: Elby Showers, MD [address] [place], [place] [address] PCP: Elby Showers, MD  Chief Complaint  Patient presents with  . Left Ankle - Routine Post Op    01/04/18 ORIF bilmal ankle fx       HPI: The patient is a 83 year old female who is approximately 2 months status post open reduction internal fixation of a bimalleolar ankle fracture on the left on 01/04/2018.  She has been ambulating weight-bear as tolerated with a rolling walker.  She has been discharged from skilled nursing and her daughter is living with her.  She has a residual wound over the dorsal aspect of her foot which may have been a pressure area from her boot and they have been treating this with Silvadene.  She has not been getting the area wet in the shower yet.  She reports little pain in the area.  She is pleased with her progress.  Assessment & Plan: Visit Diagnoses:  1. Closed left ankle fracture, with routine healing, subsequent encounter   2. Non-pressure chronic ulcer of other part of left foot limited to breakdown of skin ([place])     Plan: Patient may shower and wash the foot and incisional areas on the left with Dial soap and water.  Then pat dry and apply Silvadene cream to the residual wound daily.  She can continue weightbearing as tolerated ambulating with her walker.  She will follow-up in 2 weeks for recheck of her wound and follow-up radiographs if indicated at that time.  Follow-Up Instructions: Return in about 2 weeks (around 03/26/2018).   Ortho Exam  Patient is alert, oriented, no adenopathy, well-dressed, normal affect, normal respiratory effort. Patient is ambulatory with a rolling walker.  Her incisions are healing well and without signs of breakdown.  There is no cellulitis.  There is minimal edema .  There is no drainage  from the incisions.  She has a residual wound over the dorsum of her foot which is 4 x 1.5 cm with a central yellow area of slough.  There is no signs of cellulitis. Imaging: No results found. No images are attached to the encounter.  Labs: Lab Results  Component Value Date   HGBA1C 5.5 06/13/2016   HGBA1C 5.8 (H) 12/07/2015   HGBA1C 5.8 (H) 06/08/2015   LABURIC 11.5 (H) 12/21/2017   LABURIC 8.0 (H) 07/18/2011     Lab Results  Component Value Date   ALBUMIN 4.4 12/21/2017   ALBUMIN 3.7 09/11/2017   ALBUMIN 3.7 07/06/2017   LABURIC 11.5 (H) 12/21/2017   LABURIC 8.0 (H) 07/18/2011    Body mass index is 30.36 kg/m.  Orders:  No orders of the defined types were placed in this encounter.  No orders of the defined types were placed in this encounter.    Procedures: No procedures performed  Clinical Data: No additional findings.  ROS:  All other systems negative, except as noted in the HPI. Review of Systems  Objective: Vital Signs: Ht 5\' 2"  (1.575 m)   Wt 166 lb (75.3 kg)   BMI 30.36 kg/m   Specialty Comments:  No specialty comments available.  PMFS History: Patient Active Problem List   Diagnosis  Date Noted  . Benign paroxysmal positional vertigo of left ear 02/14/2018  . Neurocognitive deficits 01/22/2018  . Closed displaced bimalleolar fracture of left lower leg   . History of peptic ulcer disease   . Atherosclerosis of aorta ([place])   . Bilateral hearing loss 12/09/2015  . Kidney disease, chronic, stage III (GFR 30-59 ml/min) (HCC)   . Ventricular septal defect (VSD)   . Long-term (current) use of anticoagulants   . Hypothyroidism 03/11/2013  . Gout   . Vitamin D deficiency 03/06/2011  . Glaucoma   . Anxiety   . Spinal stenosis   . Biventricular implantable cardioverter-defibrillator in situ   . Nonischemic cardiomyopathy ([place])   . Paroxysmal atrial fibrillation (HCC)   . Hyperlipidemia   . Ventricular tachycardia   . Hypertensive heart disease    . Chronic systolic CHF (congestive heart failure) (HCC)    Past Medical History:  Diagnosis Date  . Atherosclerosis of aorta ([place])   . Biventricular ICD (implantable cardiac defibrillator) in place    medtronic   . Chronic kidney disease (CKD), stage III (moderate) (HCC)   . Chronic systolic heart failure ([place])   . Gout   . History of peptic ulcer disease   . Hypertension   . Hypothyroidism   . Lumbar disc disease   . Nonischemic cardiomyopathy ([place])   . Ventricular septal defect   . VT (ventricular tachycardia) ([place])    appropriate therapy via ICD 2013    Family History  Problem Relation Age of Onset  . Heart failure Mother   . Cancer Father     Past Surgical History:  Procedure Laterality Date  . ABDOMINAL HYSTERECTOMY    . BIV ICD GENERTAOR CHANGE OUT N/A 04/16/2013   Procedure: BIV ICD GENERTAOR CHANGE OUT;  Surgeon: Deboraha Sprang, MD;  Location: [facility] CATH LAB;  Service: Cardiovascular;  Laterality: N/A;  . CESAREAN SECTION    . FEMUR IM NAIL    . ORIF ANKLE FRACTURE Left 01/04/2018   Procedure: OPEN REDUCTION INTERNAL FIXATION (ORIF) ANKLE FRACTURE;  Surgeon: Meredith Pel, MD;  Location: [facility];  Service: Orthopedics;  Laterality: Left;  . TEE WITHOUT CARDIOVERSION N/A 10/31/2017   Procedure: TRANSESOPHAGEAL ECHOCARDIOGRAM (TEE);  Surgeon: Larey Dresser, MD;  Location: [facility] ENDOSCOPY;  Service: Cardiovascular;  Laterality: N/A;   Social History   Occupational History  . Not on file  Tobacco Use  . Smoking status: Never Smoker  . Smokeless tobacco: Never Used  Substance and Sexual Activity  . Alcohol use: No  . Drug use: No  . Sexual activity: Not on file

## 2018-03-14 ENCOUNTER — Ambulatory Visit: Payer: Medicare Other | Admitting: Internal Medicine

## 2018-03-18 ENCOUNTER — Telehealth: Payer: Self-pay | Admitting: Internal Medicine

## 2018-03-18 ENCOUNTER — Ambulatory Visit: Payer: Medicare Other | Admitting: Internal Medicine

## 2018-03-18 NOTE — Telephone Encounter (Signed)
Patient had appointment for follow up discharge from [facility] 03/03/18.  Appointment was made by staff of [facility] for Thursday 10/3 @ 12:00 noon.  Patient was a NS.  When I spoke with the patient, she said that she didn't know anything about the appointment.  R/S the appointment for Monday, 10/7 @ 12:00 noon.  Patient was a NS.  Daughter, Alinda Dooms, who lives on [place], [place] called me and said that her Mother did not know anything about the appointment on 10/3 and she did not know about the appointment  On Monday 10/7.  I told her that I couldn't speak to the 10/3 appointment because I made that appointment with [facility].  However, I made the 10/7 appointment with her Mother on 10/3 when she did not show up for her appointment.  She said that her Mother would not have transportation to get to the appointment.  However, patient was able to get to an Orthopedic appointment on 10/1.  Lynnette was abrasive at first and wanted to know how the patient was to get around on her broken foot without physical therapy.  She continued to say that NO ONE had been out to see the patient since DC from [facility] and she wanted to know WHEN someone was going to be out there and see her Mother for PT.  I advised that she should be calling [facility] to see who they had set up for PT.  I tried to explain to her that until her Mother saw Dr. Renold Genta for her follow up appointment to assess her and treat her that we really could not do anything about it.  She needed to take this up with [facility] and find out who they had ordered for Home Health and Physical Therapy post discharge for her Mother.    She called back and provided a name of Tillie Rung as a Education officer, museum at [employer] and gave me a phone # of [phone].  By this time it was 5:00 p.m. On Friday.  Dr. Renold Genta wanted to speak with the daughter.  Dr. Renold Genta spoke with her and she provided another name to Dr. Renold Genta of [place] with [facility] at [phone].   Daughter went on to say that the patient would not be able to come and see Dr. Renold Genta for any further appointments until her foot was healed and she was able to drive to her own appointments.  The reason being that she did not have transportation to get to her appointments.  She could not rely on the neighbor across the street, they have their own life and she cannot get in and out of a cab with her current condition.  She is just going to have to wait until she is doing better with this broken foot and is able to drive herself around again.  So, she cancelled the appointment for today, 10/7 at 12:00 p.m.  I called Angie at [phone] on Friday and LM for her that we needed to speak with her to understand why no one had been out to see the pt since her DC from [place] on 9/22.    When Dr. Renold Genta arrived this morning, 10/7 at 9:30 a.m., we had not heard from [facility], so she called and LM for Angie and ask her to call back and speak with me.l  She called back and we had a detailed conversation.  She stated that when patient was DC'd from [facility], they did not have an RN that could  go out and assess patient within a 48 hour timeframe, so they sent the patient over to [facility] and signed off to them.  They assumed everything would be taken care.    Lynnette, daughter called Angie back on Thursday and wanted to know why someone didn't show up on Wednesday for PT for her Mother.  Angie wasn't aware that someone was supposed to because they were the [facility] was supposed to be doing everything.  [facility] was supposed supposed to have someone coming Wednesday, Thursday and Friday and no one showed up any of those days!!!    I told Angie that Dr. Renold Genta was willing to sign off on PT, HOWEVER, with Medicare's guidelines for seeing the patient after DC from a facility (7-14 days), that Thursday, 10/10 is the 14th day after discharge, and we NEED to see this patient for follow-up.  She  said that she will make sure that she gets someone out to see the patient for physical therapy and she will arrange for patient to get here on Thursday, 10/10 at 12:00 noon.  Then, the daughter called to cancel the 10/10 appointment stating that patient's transportation cannot make it because they have a meeting on that day.  I said we do not have any additional time.  This is the 14th day and we are OUT OF TIME due to Medicare's guidelines.  We can see her tomorrow, 10/8 at 12:00 noon if you would like, but we cannot go any later out than 10/10.  She said she would call the "transportation" back and see what she could find out.  I have not heard back from the daughter since.

## 2018-03-19 DIAGNOSIS — I428 Other cardiomyopathies: Secondary | ICD-10-CM | POA: Diagnosis not present

## 2018-03-19 DIAGNOSIS — Z9181 History of falling: Secondary | ICD-10-CM | POA: Diagnosis not present

## 2018-03-19 DIAGNOSIS — I130 Hypertensive heart and chronic kidney disease with heart failure and stage 1 through stage 4 chronic kidney disease, or unspecified chronic kidney disease: Secondary | ICD-10-CM | POA: Diagnosis not present

## 2018-03-19 DIAGNOSIS — M5136 Other intervertebral disc degeneration, lumbar region: Secondary | ICD-10-CM | POA: Diagnosis not present

## 2018-03-19 DIAGNOSIS — W19XXXD Unspecified fall, subsequent encounter: Secondary | ICD-10-CM | POA: Diagnosis not present

## 2018-03-19 DIAGNOSIS — M4800 Spinal stenosis, site unspecified: Secondary | ICD-10-CM | POA: Diagnosis not present

## 2018-03-19 DIAGNOSIS — I5022 Chronic systolic (congestive) heart failure: Secondary | ICD-10-CM | POA: Diagnosis not present

## 2018-03-19 DIAGNOSIS — Z7901 Long term (current) use of anticoagulants: Secondary | ICD-10-CM | POA: Diagnosis not present

## 2018-03-19 DIAGNOSIS — H8112 Benign paroxysmal vertigo, left ear: Secondary | ICD-10-CM | POA: Diagnosis not present

## 2018-03-19 DIAGNOSIS — M109 Gout, unspecified: Secondary | ICD-10-CM | POA: Diagnosis not present

## 2018-03-19 DIAGNOSIS — N184 Chronic kidney disease, stage 4 (severe): Secondary | ICD-10-CM | POA: Diagnosis not present

## 2018-03-19 DIAGNOSIS — Z95810 Presence of automatic (implantable) cardiac defibrillator: Secondary | ICD-10-CM | POA: Diagnosis not present

## 2018-03-19 DIAGNOSIS — I480 Paroxysmal atrial fibrillation: Secondary | ICD-10-CM | POA: Diagnosis not present

## 2018-03-19 DIAGNOSIS — S82842D Displaced bimalleolar fracture of left lower leg, subsequent encounter for closed fracture with routine healing: Secondary | ICD-10-CM | POA: Diagnosis not present

## 2018-03-21 ENCOUNTER — Encounter: Payer: Self-pay | Admitting: Internal Medicine

## 2018-03-21 ENCOUNTER — Ambulatory Visit (INDEPENDENT_AMBULATORY_CARE_PROVIDER_SITE_OTHER): Payer: Medicare Other | Admitting: Internal Medicine

## 2018-03-21 VITALS — BP 100/70 | HR 98 | Temp 98.0°F | Ht 62.0 in | Wt 172.0 lb

## 2018-03-21 DIAGNOSIS — R5383 Other fatigue: Secondary | ICD-10-CM | POA: Diagnosis not present

## 2018-03-21 DIAGNOSIS — N184 Chronic kidney disease, stage 4 (severe): Secondary | ICD-10-CM

## 2018-03-21 DIAGNOSIS — H903 Sensorineural hearing loss, bilateral: Secondary | ICD-10-CM

## 2018-03-21 DIAGNOSIS — Z23 Encounter for immunization: Secondary | ICD-10-CM | POA: Diagnosis not present

## 2018-03-21 DIAGNOSIS — S82892D Other fracture of left lower leg, subsequent encounter for closed fracture with routine healing: Secondary | ICD-10-CM | POA: Diagnosis not present

## 2018-03-21 DIAGNOSIS — D7589 Other specified diseases of blood and blood-forming organs: Secondary | ICD-10-CM

## 2018-03-21 DIAGNOSIS — I428 Other cardiomyopathies: Secondary | ICD-10-CM

## 2018-03-21 DIAGNOSIS — S82892A Other fracture of left lower leg, initial encounter for closed fracture: Secondary | ICD-10-CM | POA: Diagnosis not present

## 2018-03-21 DIAGNOSIS — I429 Cardiomyopathy, unspecified: Secondary | ICD-10-CM

## 2018-03-21 DIAGNOSIS — R7302 Impaired glucose tolerance (oral): Secondary | ICD-10-CM

## 2018-03-21 DIAGNOSIS — Z7901 Long term (current) use of anticoagulants: Secondary | ICD-10-CM | POA: Diagnosis not present

## 2018-03-21 DIAGNOSIS — E039 Hypothyroidism, unspecified: Secondary | ICD-10-CM

## 2018-03-21 DIAGNOSIS — I10 Essential (primary) hypertension: Secondary | ICD-10-CM | POA: Diagnosis not present

## 2018-03-21 NOTE — Progress Notes (Signed)
   Subjective:    Patient ID: Casey [name], female    DOB: 1935/03/06, 83 y.o.   MRN: [id number]  HPI 83 year old Female for follow up s/p discharge from [facility] after a fall at home resulting in ORIF left ankle(Bimalleoloar fracture) by Dr. Marlou Sa. Using a walker. Getting home PT. Has gained 10 pounds since May.  Hydralazine changed from tid to bid at SNF due to hypotension.   INR was 3.0 on September 9  And subsequently Coumadin dose was reduced at Rehab facility.Will be rechecked today.  Is accompanied by her neighbor was is checking in on her frequently. Daughter visits from [place] on weekends. Neighbor thinks pt is safe to be alone with her checking on her. Getting around well with walker. Spirits are good. Husband remains in SNF in poor health.  We have spent considerable effort speaking with her daughter in [place] regarding need for follow-up and could not arrange a follow-up appointment until now.  Home health services were recently arranged by this office.  History of hypothyroidism with TSH being 2.27  Chronic kidney disease with creatinine 2.61 and stable  Elevated MCV 2 months ago at 103.8 but is now normal at 98.6     Review of Systems no complaint of pain and no falls since arriving home. Appetite fine.     Objective:   Physical Exam  Constitutional: She appears well-developed and well-nourished. No distress.  Cardiovascular: Normal rate, regular rhythm and normal heart sounds.  Skin: She is not diaphoretic.  Vitals reviewed.         Assessment & Plan:  Status post bimalleolar ankle fracture-getting along well at home.  Status post rehab admission after fracture repaired.  Discharged from rehab facility September 20  Chronic anticoagulation-pro time INR was 3 on  Sept 20 and repeated today with level of 2.  Continue same dose of Coumadin and fax results to Dr. Wynonia Lawman.  Elevated MCV of 103.82 months ago.  Folate and B12 levels are  normal.  Impaired glucose tolerance-hemoglobin A1c 5.6%  Hypothyroidism-TSH is within normal limits  History of chronic systolic heart failure status post defibrillator placement and pacemaker with history of paroxysmal atrial fibrillation maintained on chronic anticoagulation  History of chronic kidney disease  Essential hypertension  Left ankle fracture July 2019 status post fall at home  Plan: Continue home health and home PT.  Has follow-up with orthopedist in November 4.

## 2018-03-22 DIAGNOSIS — H8112 Benign paroxysmal vertigo, left ear: Secondary | ICD-10-CM | POA: Diagnosis not present

## 2018-03-22 DIAGNOSIS — I5022 Chronic systolic (congestive) heart failure: Secondary | ICD-10-CM | POA: Diagnosis not present

## 2018-03-22 DIAGNOSIS — S82842D Displaced bimalleolar fracture of left lower leg, subsequent encounter for closed fracture with routine healing: Secondary | ICD-10-CM | POA: Diagnosis not present

## 2018-03-22 DIAGNOSIS — I130 Hypertensive heart and chronic kidney disease with heart failure and stage 1 through stage 4 chronic kidney disease, or unspecified chronic kidney disease: Secondary | ICD-10-CM | POA: Diagnosis not present

## 2018-03-22 DIAGNOSIS — N184 Chronic kidney disease, stage 4 (severe): Secondary | ICD-10-CM | POA: Diagnosis not present

## 2018-03-22 DIAGNOSIS — W19XXXD Unspecified fall, subsequent encounter: Secondary | ICD-10-CM | POA: Diagnosis not present

## 2018-03-22 LAB — COMPLETE METABOLIC PANEL WITH GFR
AG Ratio: 1.5 (calc) (ref 1.0–2.5)
ALBUMIN MSPROF: 4.3 g/dL (ref 3.6–5.1)
ALKALINE PHOSPHATASE (APISO): 152 U/L — AB (ref 33–130)
ALT: 11 U/L (ref 6–29)
AST: 18 U/L (ref 10–35)
BUN/Creatinine Ratio: 19 (calc) (ref 6–22)
BUN: 50 mg/dL — AB (ref 7–25)
CALCIUM: 9.9 mg/dL (ref 8.6–10.4)
CO2: 26 mmol/L (ref 20–32)
Chloride: 106 mmol/L (ref 98–110)
Creat: 2.61 mg/dL — ABNORMAL HIGH (ref 0.60–0.88)
GFR, EST NON AFRICAN AMERICAN: 17 mL/min/{1.73_m2} — AB (ref >=60)
GFR, Est African American: 19 mL/min/{1.73_m2} — ABNORMAL LOW (ref >=60)
GLOBULIN: 2.8 g/dL (ref 1.9–3.7)
GLUCOSE: 75 mg/dL (ref 65–99)
Potassium: 4.3 mmol/L (ref 3.5–5.3)
Sodium: 143 mmol/L (ref 135–146)
Total Bilirubin: 0.9 mg/dL (ref 0.2–1.2)
Total Protein: 7.1 g/dL (ref 6.1–8.1)

## 2018-03-22 LAB — VITAMIN B12: VITAMIN B 12: 468 pg/mL (ref 200–1100)

## 2018-03-22 LAB — CBC WITH DIFFERENTIAL/PLATELET
Basophils Absolute: 58 cells/uL (ref 0–200)
Basophils Relative: 1.1 %
Eosinophils Absolute: 90 cells/uL (ref 15–500)
Eosinophils Relative: 1.7 %
HEMATOCRIT: 43.1 % (ref 35.0–45.0)
HEMOGLOBIN: 14.6 g/dL (ref 11.7–15.5)
LYMPHS ABS: 1224 {cells}/uL (ref 850–3900)
MCH: 33.4 pg — ABNORMAL HIGH (ref 27.0–33.0)
MCHC: 33.9 g/dL (ref 32.0–36.0)
MCV: 98.6 fL (ref 80.0–100.0)
MONOS PCT: 11.6 %
MPV: 11.4 fL (ref 7.5–12.5)
NEUTROS ABS: 3313 {cells}/uL (ref 1500–7800)
Neutrophils Relative %: 62.5 %
Platelets: 171 10*3/uL (ref 140–400)
RBC: 4.37 10*6/uL (ref 3.80–5.10)
RDW: 12.9 % (ref 11.0–15.0)
Total Lymphocyte: 23.1 %
WBC mixed population: 615 cells/uL (ref 200–950)
WBC: 5.3 10*3/uL (ref 3.8–10.8)

## 2018-03-22 LAB — TSH: TSH: 2.27 mIU/L (ref 0.40–4.50)

## 2018-03-22 LAB — HEMOGLOBIN A1C
EAG (MMOL/L): 6.3 (calc)
HEMOGLOBIN A1C: 5.6 %{Hb} (ref <5.7)
MEAN PLASMA GLUCOSE: 114 (calc)

## 2018-03-22 LAB — PROTIME-INR
INR: 2 — ABNORMAL HIGH
Prothrombin Time: 19.6 s — ABNORMAL HIGH (ref 9.0–11.5)

## 2018-03-22 LAB — FOLATE

## 2018-03-26 ENCOUNTER — Ambulatory Visit (INDEPENDENT_AMBULATORY_CARE_PROVIDER_SITE_OTHER): Payer: Medicare Other | Admitting: Orthopedic Surgery

## 2018-03-26 ENCOUNTER — Encounter (INDEPENDENT_AMBULATORY_CARE_PROVIDER_SITE_OTHER): Payer: Self-pay | Admitting: Orthopedic Surgery

## 2018-03-26 VITALS — Ht 62.0 in | Wt 172.0 lb

## 2018-03-26 DIAGNOSIS — I5022 Chronic systolic (congestive) heart failure: Secondary | ICD-10-CM | POA: Diagnosis not present

## 2018-03-26 DIAGNOSIS — L97521 Non-pressure chronic ulcer of other part of left foot limited to breakdown of skin: Secondary | ICD-10-CM

## 2018-03-26 DIAGNOSIS — S82892D Other fracture of left lower leg, subsequent encounter for closed fracture with routine healing: Secondary | ICD-10-CM | POA: Diagnosis not present

## 2018-03-26 DIAGNOSIS — W19XXXD Unspecified fall, subsequent encounter: Secondary | ICD-10-CM | POA: Diagnosis not present

## 2018-03-26 DIAGNOSIS — H8112 Benign paroxysmal vertigo, left ear: Secondary | ICD-10-CM | POA: Diagnosis not present

## 2018-03-26 DIAGNOSIS — N184 Chronic kidney disease, stage 4 (severe): Secondary | ICD-10-CM | POA: Diagnosis not present

## 2018-03-26 DIAGNOSIS — I130 Hypertensive heart and chronic kidney disease with heart failure and stage 1 through stage 4 chronic kidney disease, or unspecified chronic kidney disease: Secondary | ICD-10-CM | POA: Diagnosis not present

## 2018-03-26 DIAGNOSIS — S82842D Displaced bimalleolar fracture of left lower leg, subsequent encounter for closed fracture with routine healing: Secondary | ICD-10-CM | POA: Diagnosis not present

## 2018-03-26 NOTE — Progress Notes (Signed)
Office Visit Note   Patient: Casey [name]           Date of Birth: 07-11-1934           MRN: [id number] Visit Date: 03/26/2018              Requested by: Elby Showers, MD [address] [place], [place] [address] PCP: Elby Showers, MD  Chief Complaint  Patient presents with  . Left Ankle - Follow-up  . Left Foot - Follow-up      HPI: Patient is a 83 year old woman who was seen for evaluation for ischemic ulcer dorsal aspect of the left foot.  Patient states she feels better she states she still has some edema around the ankle has been using Silvadene dressings for the dorsal foot ulcer.  She is almost 3 months status post open reduction internal fixation for her ankle fracture.  Assessment & Plan: Visit Diagnoses:  1. Closed left ankle fracture, with routine healing, subsequent encounter   2. Non-pressure chronic ulcer of other part of left foot limited to breakdown of skin ([place])     Plan: Recommended Dial soap cleansing with soap and water continue with Silvadene dressing weightbearing as tolerated in a postoperative shoe.  Follow-Up Instructions: Return in about 3 weeks (around 04/16/2018).   Ortho Exam  Patient is alert, oriented, no adenopathy, well-dressed, normal affect, normal respiratory effort. Examination of the lateral incision is well-healed proximally there is a retained suture that was removed.  The dorsal wound over the foot is flat superficial and the eschar is resolving.  There is no redness no cellulitis no odor no drainage no signs of infection there are no forefoot ulcers.  Imaging: No results found. No images are attached to the encounter.  Labs: Lab Results  Component Value Date   HGBA1C 5.6 03/21/2018   HGBA1C 5.5 06/13/2016   HGBA1C 5.8 (H) 12/07/2015   LABURIC 11.5 (H) 12/21/2017   LABURIC 8.0 (H) 07/18/2011     Lab Results  Component Value Date   ALBUMIN 4.4 12/21/2017   ALBUMIN 3.7 09/11/2017   ALBUMIN 3.7  07/06/2017   LABURIC 11.5 (H) 12/21/2017   LABURIC 8.0 (H) 07/18/2011    Body mass index is 31.46 kg/m.  Orders:  No orders of the defined types were placed in this encounter.  No orders of the defined types were placed in this encounter.    Procedures: No procedures performed  Clinical Data: No additional findings.  ROS:  All other systems negative, except as noted in the HPI. Review of Systems  Objective: Vital Signs: Ht 5\' 2"  (1.575 m)   Wt 172 lb (78 kg)   BMI 31.46 kg/m   Specialty Comments:  No specialty comments available.  PMFS History: Patient Active Problem List   Diagnosis Date Noted  . Benign paroxysmal positional vertigo of left ear 02/14/2018  . Neurocognitive deficits 01/22/2018  . Closed displaced bimalleolar fracture of left lower leg   . History of peptic ulcer disease   . Atherosclerosis of aorta ([place])   . Bilateral hearing loss 12/09/2015  . Kidney disease, chronic, stage III (GFR 30-59 ml/min) (HCC)   . Ventricular septal defect (VSD)   . Long-term (current) use of anticoagulants   . Hypothyroidism 03/11/2013  . Gout   . Vitamin D deficiency 03/06/2011  . Glaucoma   . Anxiety   . Spinal stenosis   . Biventricular implantable cardioverter-defibrillator in situ   . Nonischemic cardiomyopathy ([place])   .  Paroxysmal atrial fibrillation (HCC)   . Hyperlipidemia   . Ventricular tachycardia   . Hypertensive heart disease   . Chronic systolic CHF (congestive heart failure) (HCC)    Past Medical History:  Diagnosis Date  . Atherosclerosis of aorta ([place])   . Biventricular ICD (implantable cardiac defibrillator) in place    medtronic   . Chronic kidney disease (CKD), stage III (moderate) (HCC)   . Chronic systolic heart failure ([place])   . Gout   . History of peptic ulcer disease   . Hypertension   . Hypothyroidism   . Lumbar disc disease   . Nonischemic cardiomyopathy ([place])   . Ventricular septal defect   . VT (ventricular tachycardia)  ([place])    appropriate therapy via ICD 2013    Family History  Problem Relation Age of Onset  . Heart failure Mother   . Cancer Father     Past Surgical History:  Procedure Laterality Date  . ABDOMINAL HYSTERECTOMY    . BIV ICD GENERTAOR CHANGE OUT N/A 04/16/2013   Procedure: BIV ICD GENERTAOR CHANGE OUT;  Surgeon: Deboraha Sprang, MD;  Location: [facility] CATH LAB;  Service: Cardiovascular;  Laterality: N/A;  . CESAREAN SECTION    . FEMUR IM NAIL    . ORIF ANKLE FRACTURE Left 01/04/2018   Procedure: OPEN REDUCTION INTERNAL FIXATION (ORIF) ANKLE FRACTURE;  Surgeon: Meredith Pel, MD;  Location: [place];  Service: Orthopedics;  Laterality: Left;  . TEE WITHOUT CARDIOVERSION N/A 10/31/2017   Procedure: TRANSESOPHAGEAL ECHOCARDIOGRAM (TEE);  Surgeon: Larey Dresser, MD;  Location: [facility] ENDOSCOPY;  Service: Cardiovascular;  Laterality: N/A;   Social History   Occupational History  . Not on file  Tobacco Use  . Smoking status: Never Smoker  . Smokeless tobacco: Never Used  Substance and Sexual Activity  . Alcohol use: No  . Drug use: No  . Sexual activity: Not on file

## 2018-03-28 DIAGNOSIS — S82842D Displaced bimalleolar fracture of left lower leg, subsequent encounter for closed fracture with routine healing: Secondary | ICD-10-CM | POA: Diagnosis not present

## 2018-03-28 DIAGNOSIS — W19XXXD Unspecified fall, subsequent encounter: Secondary | ICD-10-CM | POA: Diagnosis not present

## 2018-03-28 DIAGNOSIS — H8112 Benign paroxysmal vertigo, left ear: Secondary | ICD-10-CM | POA: Diagnosis not present

## 2018-03-28 DIAGNOSIS — I130 Hypertensive heart and chronic kidney disease with heart failure and stage 1 through stage 4 chronic kidney disease, or unspecified chronic kidney disease: Secondary | ICD-10-CM | POA: Diagnosis not present

## 2018-03-28 DIAGNOSIS — I5022 Chronic systolic (congestive) heart failure: Secondary | ICD-10-CM | POA: Diagnosis not present

## 2018-03-28 DIAGNOSIS — N184 Chronic kidney disease, stage 4 (severe): Secondary | ICD-10-CM | POA: Diagnosis not present

## 2018-03-29 ENCOUNTER — Telehealth: Payer: Self-pay | Admitting: Internal Medicine

## 2018-03-29 DIAGNOSIS — I700 Atherosclerosis of aorta: Secondary | ICD-10-CM

## 2018-03-29 DIAGNOSIS — M109 Gout, unspecified: Secondary | ICD-10-CM

## 2018-03-29 DIAGNOSIS — I5022 Chronic systolic (congestive) heart failure: Secondary | ICD-10-CM

## 2018-03-29 DIAGNOSIS — H8112 Benign paroxysmal vertigo, left ear: Secondary | ICD-10-CM

## 2018-03-29 DIAGNOSIS — E039 Hypothyroidism, unspecified: Secondary | ICD-10-CM

## 2018-03-29 MED ORDER — HYDRALAZINE HCL 25 MG PO TABS: 25.0000 mg | ORAL_TABLET | Freq: Two times a day (BID) | ORAL | 1 refills | Status: DC

## 2018-03-29 MED ORDER — POTASSIUM CHLORIDE CRYS ER 20 MEQ PO TBCR: 20.0000 meq | EXTENDED_RELEASE_TABLET | Freq: Every day | ORAL | 1 refills | Status: DC

## 2018-03-29 MED ORDER — ISOSORBIDE MONONITRATE ER 30 MG PO TB24: 30.0000 mg | ORAL_TABLET | Freq: Every day | ORAL | 1 refills | Status: DC

## 2018-03-29 MED ORDER — TORSEMIDE 20 MG PO TABS: 60.0000 mg | ORAL_TABLET | Freq: Every day | ORAL | 0 refills | Status: DC

## 2018-03-29 MED ORDER — LEVOTHYROXINE SODIUM 75 MCG PO TABS: 75.0000 ug | ORAL_TABLET | Freq: Every day | ORAL | 1 refills | Status: DC

## 2018-03-29 MED ORDER — COLCHICINE 0.6 MG PO TABS: 0.6000 mg | ORAL_TABLET | Freq: Every day | ORAL | 0 refills | Status: DC | PRN

## 2018-03-29 MED ORDER — WARFARIN SODIUM 2 MG PO TABS: 2.0000 mg | ORAL_TABLET | Freq: Every day | ORAL | 1 refills | Status: DC

## 2018-03-29 MED ORDER — METOPROLOL SUCCINATE ER 50 MG PO TB24: 50.0000 mg | ORAL_TABLET | Freq: Every day | ORAL | 1 refills | Status: DC

## 2018-03-29 NOTE — Addendum Note (Signed)
Addended by: Mady Haagensen on: 03/29/2018 12:25 PM   Modules accepted: Orders

## 2018-03-29 NOTE — Telephone Encounter (Addendum)
Patient is calling to request that her medications be called in.  She said that Dr. Wynonia Lawman is sick and she cannot get her medications filled.  So, she needed to have you fill them for her.  Says that she only has 1 heart pill left.  The best I can understand from her is that she only has 1 Coumadin left.  And, when she called Dr. Thurman Coyer office they wanted her to come in and have labs drawn, she told them she had been here to see you and so they referred her here.    So, she's calling to get her medicines filled.  And, she wants to know if she can get more than 30 days, can she get 90 days especially while she's having trouble with transportation?  Pharmacy:  [place] [facility]  Phone:  [phone]  Have refilled these meds but only given 30 days of Coumadin as dose may change.

## 2018-04-02 ENCOUNTER — Ambulatory Visit (INDEPENDENT_AMBULATORY_CARE_PROVIDER_SITE_OTHER): Payer: Medicare Other | Admitting: *Deleted

## 2018-04-02 DIAGNOSIS — I428 Other cardiomyopathies: Secondary | ICD-10-CM | POA: Diagnosis not present

## 2018-04-02 DIAGNOSIS — S82842D Displaced bimalleolar fracture of left lower leg, subsequent encounter for closed fracture with routine healing: Secondary | ICD-10-CM | POA: Diagnosis not present

## 2018-04-02 DIAGNOSIS — I5022 Chronic systolic (congestive) heart failure: Secondary | ICD-10-CM | POA: Diagnosis not present

## 2018-04-02 DIAGNOSIS — H8112 Benign paroxysmal vertigo, left ear: Secondary | ICD-10-CM | POA: Diagnosis not present

## 2018-04-02 DIAGNOSIS — W19XXXD Unspecified fall, subsequent encounter: Secondary | ICD-10-CM | POA: Diagnosis not present

## 2018-04-02 DIAGNOSIS — N184 Chronic kidney disease, stage 4 (severe): Secondary | ICD-10-CM | POA: Diagnosis not present

## 2018-04-02 DIAGNOSIS — I130 Hypertensive heart and chronic kidney disease with heart failure and stage 1 through stage 4 chronic kidney disease, or unspecified chronic kidney disease: Secondary | ICD-10-CM | POA: Diagnosis not present

## 2018-04-02 NOTE — Progress Notes (Signed)
Remote ICD transmission.   

## 2018-04-03 ENCOUNTER — Encounter: Payer: Self-pay | Admitting: Cardiology

## 2018-04-04 DIAGNOSIS — I5022 Chronic systolic (congestive) heart failure: Secondary | ICD-10-CM | POA: Diagnosis not present

## 2018-04-04 DIAGNOSIS — S82842D Displaced bimalleolar fracture of left lower leg, subsequent encounter for closed fracture with routine healing: Secondary | ICD-10-CM | POA: Diagnosis not present

## 2018-04-04 DIAGNOSIS — I130 Hypertensive heart and chronic kidney disease with heart failure and stage 1 through stage 4 chronic kidney disease, or unspecified chronic kidney disease: Secondary | ICD-10-CM | POA: Diagnosis not present

## 2018-04-04 DIAGNOSIS — H8112 Benign paroxysmal vertigo, left ear: Secondary | ICD-10-CM | POA: Diagnosis not present

## 2018-04-04 DIAGNOSIS — N184 Chronic kidney disease, stage 4 (severe): Secondary | ICD-10-CM | POA: Diagnosis not present

## 2018-04-04 DIAGNOSIS — W19XXXD Unspecified fall, subsequent encounter: Secondary | ICD-10-CM | POA: Diagnosis not present

## 2018-04-07 NOTE — Patient Instructions (Signed)
Continue home health services and physical therapy.  Needs Medicare physical exam before the end of the year.

## 2018-04-08 DIAGNOSIS — S82842S Displaced bimalleolar fracture of left lower leg, sequela: Secondary | ICD-10-CM | POA: Diagnosis not present

## 2018-04-09 ENCOUNTER — Ambulatory Visit (INDEPENDENT_AMBULATORY_CARE_PROVIDER_SITE_OTHER): Payer: Medicare Other | Admitting: Orthopedic Surgery

## 2018-04-09 DIAGNOSIS — S82842D Displaced bimalleolar fracture of left lower leg, subsequent encounter for closed fracture with routine healing: Secondary | ICD-10-CM | POA: Diagnosis not present

## 2018-04-09 DIAGNOSIS — I130 Hypertensive heart and chronic kidney disease with heart failure and stage 1 through stage 4 chronic kidney disease, or unspecified chronic kidney disease: Secondary | ICD-10-CM | POA: Diagnosis not present

## 2018-04-09 DIAGNOSIS — W19XXXD Unspecified fall, subsequent encounter: Secondary | ICD-10-CM | POA: Diagnosis not present

## 2018-04-09 DIAGNOSIS — H8112 Benign paroxysmal vertigo, left ear: Secondary | ICD-10-CM | POA: Diagnosis not present

## 2018-04-09 DIAGNOSIS — N184 Chronic kidney disease, stage 4 (severe): Secondary | ICD-10-CM | POA: Diagnosis not present

## 2018-04-09 DIAGNOSIS — I5022 Chronic systolic (congestive) heart failure: Secondary | ICD-10-CM | POA: Diagnosis not present

## 2018-04-11 DIAGNOSIS — S82842D Displaced bimalleolar fracture of left lower leg, subsequent encounter for closed fracture with routine healing: Secondary | ICD-10-CM | POA: Diagnosis not present

## 2018-04-11 DIAGNOSIS — I130 Hypertensive heart and chronic kidney disease with heart failure and stage 1 through stage 4 chronic kidney disease, or unspecified chronic kidney disease: Secondary | ICD-10-CM | POA: Diagnosis not present

## 2018-04-11 DIAGNOSIS — I5022 Chronic systolic (congestive) heart failure: Secondary | ICD-10-CM | POA: Diagnosis not present

## 2018-04-11 DIAGNOSIS — W19XXXD Unspecified fall, subsequent encounter: Secondary | ICD-10-CM | POA: Diagnosis not present

## 2018-04-11 DIAGNOSIS — N184 Chronic kidney disease, stage 4 (severe): Secondary | ICD-10-CM | POA: Diagnosis not present

## 2018-04-11 DIAGNOSIS — H8112 Benign paroxysmal vertigo, left ear: Secondary | ICD-10-CM | POA: Diagnosis not present

## 2018-04-15 ENCOUNTER — Encounter (INDEPENDENT_AMBULATORY_CARE_PROVIDER_SITE_OTHER): Payer: Self-pay | Admitting: Orthopedic Surgery

## 2018-04-15 ENCOUNTER — Ambulatory Visit (INDEPENDENT_AMBULATORY_CARE_PROVIDER_SITE_OTHER): Payer: Medicare Other | Admitting: Physician Assistant

## 2018-04-15 VITALS — Ht 62.0 in | Wt 172.0 lb

## 2018-04-15 DIAGNOSIS — S82892D Other fracture of left lower leg, subsequent encounter for closed fracture with routine healing: Secondary | ICD-10-CM

## 2018-04-15 DIAGNOSIS — L97521 Non-pressure chronic ulcer of other part of left foot limited to breakdown of skin: Secondary | ICD-10-CM

## 2018-04-16 ENCOUNTER — Encounter (INDEPENDENT_AMBULATORY_CARE_PROVIDER_SITE_OTHER): Payer: Self-pay | Admitting: Physician Assistant

## 2018-04-16 DIAGNOSIS — S82842D Displaced bimalleolar fracture of left lower leg, subsequent encounter for closed fracture with routine healing: Secondary | ICD-10-CM | POA: Diagnosis not present

## 2018-04-16 DIAGNOSIS — H8112 Benign paroxysmal vertigo, left ear: Secondary | ICD-10-CM | POA: Diagnosis not present

## 2018-04-16 DIAGNOSIS — I130 Hypertensive heart and chronic kidney disease with heart failure and stage 1 through stage 4 chronic kidney disease, or unspecified chronic kidney disease: Secondary | ICD-10-CM | POA: Diagnosis not present

## 2018-04-16 DIAGNOSIS — I5022 Chronic systolic (congestive) heart failure: Secondary | ICD-10-CM | POA: Diagnosis not present

## 2018-04-16 DIAGNOSIS — N184 Chronic kidney disease, stage 4 (severe): Secondary | ICD-10-CM | POA: Diagnosis not present

## 2018-04-16 DIAGNOSIS — W19XXXD Unspecified fall, subsequent encounter: Secondary | ICD-10-CM | POA: Diagnosis not present

## 2018-04-16 NOTE — Progress Notes (Signed)
Office Visit Note   Patient: Casey [name]           Date of Birth: 09/15/1934           MRN: [id number] Visit Date: 04/15/2018              Requested by: Elby Showers, MD [address] [place], [place] [address] PCP: Elby Showers, MD  Chief Complaint  Patient presents with  . Left Ankle - Follow-up    01/04/18 left ankle ORIF ankle fracture.       HPI: The patient is an 83 year old female who is seen for follow-up of the ulcer over the dorsal aspect of her left foot.  She is 3-1/2 months status post open reduction internal fixation for an ankle fracture.  She reports no significant pain over the area.  She has been full weightbearing ambulating with a cane in a regular shoe.  We discussed utilizing some compression to control swelling over the area but she does not want to wear the compression.  She has been using Silvadene to the area and desires to transition to utilizing the Silvadene and just a Band-Aid over the residual wound.  Assessment & Plan: Visit Diagnoses:  1. Closed left ankle fracture, with routine healing, subsequent encounter   2. Non-pressure chronic ulcer of other part of left foot limited to breakdown of skin ([place])     Plan: Discussed utilizing a compression stocking to help with edema but the patient is not willing to do this at this time.  Okay for the patient to continue Silvadene to the area and utilize a Band-Aid.  She can continue to weight-bear as tolerated with her cane and walk in a regular shoe.  We will have her follow-up in 4 weeks or sooner should she have difficulties in the interim.  Follow-Up Instructions: Return in about 4 weeks (around 05/13/2018).   Ortho Exam  Patient is alert, oriented, no adenopathy, well-dressed, normal affect, normal respiratory effort. The lateral incision is well-healed.  The dorsal wound over the foot is very superficial and epithelializing with hypopigmented skin.  There is no signs of cellulitis or  other signs of infection.  No other areas of ulceration.  Imaging: No results found. No images are attached to the encounter.  Labs: Lab Results  Component Value Date   HGBA1C 5.6 03/21/2018   HGBA1C 5.5 06/13/2016   HGBA1C 5.8 (H) 12/07/2015   LABURIC 11.5 (H) 12/21/2017   LABURIC 8.0 (H) 07/18/2011     Lab Results  Component Value Date   ALBUMIN 4.4 12/21/2017   ALBUMIN 3.7 09/11/2017   ALBUMIN 3.7 07/06/2017   LABURIC 11.5 (H) 12/21/2017   LABURIC 8.0 (H) 07/18/2011    Body mass index is 31.46 kg/m.  Orders:  No orders of the defined types were placed in this encounter.  No orders of the defined types were placed in this encounter.    Procedures: No procedures performed  Clinical Data: No additional findings.  ROS:  All other systems negative, except as noted in the HPI. Review of Systems  Objective: Vital Signs: Ht 5\' 2"  (1.575 m)   Wt 172 lb (78 kg)   BMI 31.46 kg/m   Specialty Comments:  No specialty comments available.  PMFS History: Patient Active Problem List   Diagnosis Date Noted  . Benign paroxysmal positional vertigo of left ear 02/14/2018  . Neurocognitive deficits 01/22/2018  . Closed displaced bimalleolar fracture of left lower leg   .  History of peptic ulcer disease   . Atherosclerosis of aorta ([place])   . Bilateral hearing loss 12/09/2015  . Kidney disease, chronic, stage III (GFR 30-59 ml/min) (HCC)   . Ventricular septal defect (VSD)   . Long-term (current) use of anticoagulants   . Hypothyroidism 03/11/2013  . Gout   . Vitamin D deficiency 03/06/2011  . Glaucoma   . Anxiety   . Spinal stenosis   . Biventricular implantable cardioverter-defibrillator in situ   . Nonischemic cardiomyopathy ([place])   . Paroxysmal atrial fibrillation (HCC)   . Hyperlipidemia   . Ventricular tachycardia   . Hypertensive heart disease   . Chronic systolic CHF (congestive heart failure) (HCC)    Past Medical History:  Diagnosis Date  .  Atherosclerosis of aorta ([place])   . Biventricular ICD (implantable cardiac defibrillator) in place    medtronic   . Chronic kidney disease (CKD), stage III (moderate) (HCC)   . Chronic systolic heart failure ([place])   . Gout   . History of peptic ulcer disease   . Hypertension   . Hypothyroidism   . Lumbar disc disease   . Nonischemic cardiomyopathy ([place])   . Ventricular septal defect   . VT (ventricular tachycardia) ([place])    appropriate therapy via ICD 2013    Family History  Problem Relation Age of Onset  . Heart failure Mother   . Cancer Father     Past Surgical History:  Procedure Laterality Date  . ABDOMINAL HYSTERECTOMY    . BIV ICD GENERTAOR CHANGE OUT N/A 04/16/2013   Procedure: BIV ICD GENERTAOR CHANGE OUT;  Surgeon: Deboraha Sprang, MD;  Location: [facility] CATH LAB;  Service: Cardiovascular;  Laterality: N/A;  . CESAREAN SECTION    . FEMUR IM NAIL    . ORIF ANKLE FRACTURE Left 01/04/2018   Procedure: OPEN REDUCTION INTERNAL FIXATION (ORIF) ANKLE FRACTURE;  Surgeon: Meredith Pel, MD;  Location: [place];  Service: Orthopedics;  Laterality: Left;  . TEE WITHOUT CARDIOVERSION N/A 10/31/2017   Procedure: TRANSESOPHAGEAL ECHOCARDIOGRAM (TEE);  Surgeon: Larey Dresser, MD;  Location[facility] ENDOSCOPY;  Service: Cardiovascular;  Laterality: N/A;   Social History   Occupational History  . Not on file  Tobacco Use  . Smoking status: Never Smoker  . Smokeless tobacco: Never Used  Substance and Sexual Activity  . Alcohol use: No  . Drug use: No  . Sexual activity: Not on file

## 2018-04-18 DIAGNOSIS — S82842D Displaced bimalleolar fracture of left lower leg, subsequent encounter for closed fracture with routine healing: Secondary | ICD-10-CM | POA: Diagnosis not present

## 2018-04-18 DIAGNOSIS — I5022 Chronic systolic (congestive) heart failure: Secondary | ICD-10-CM | POA: Diagnosis not present

## 2018-04-18 DIAGNOSIS — W19XXXD Unspecified fall, subsequent encounter: Secondary | ICD-10-CM | POA: Diagnosis not present

## 2018-04-18 DIAGNOSIS — H8112 Benign paroxysmal vertigo, left ear: Secondary | ICD-10-CM | POA: Diagnosis not present

## 2018-04-18 DIAGNOSIS — N184 Chronic kidney disease, stage 4 (severe): Secondary | ICD-10-CM | POA: Diagnosis not present

## 2018-04-18 DIAGNOSIS — I130 Hypertensive heart and chronic kidney disease with heart failure and stage 1 through stage 4 chronic kidney disease, or unspecified chronic kidney disease: Secondary | ICD-10-CM | POA: Diagnosis not present

## 2018-04-23 ENCOUNTER — Telehealth: Payer: Self-pay | Admitting: Emergency Medicine

## 2018-04-23 NOTE — Telephone Encounter (Signed)
Left detailed message.   

## 2018-04-23 NOTE — Telephone Encounter (Signed)
[facility] called and is requested verbal orders for PT 2 times a week for 4 more weeks. It is for strength, gait balance, training home exercise progression and home safe training. Thanks.

## 2018-04-23 NOTE — Telephone Encounter (Signed)
Go ahead and approve this

## 2018-04-24 DIAGNOSIS — N184 Chronic kidney disease, stage 4 (severe): Secondary | ICD-10-CM | POA: Diagnosis not present

## 2018-04-24 DIAGNOSIS — S82842D Displaced bimalleolar fracture of left lower leg, subsequent encounter for closed fracture with routine healing: Secondary | ICD-10-CM | POA: Diagnosis not present

## 2018-04-24 DIAGNOSIS — I5022 Chronic systolic (congestive) heart failure: Secondary | ICD-10-CM | POA: Diagnosis not present

## 2018-04-24 DIAGNOSIS — W19XXXD Unspecified fall, subsequent encounter: Secondary | ICD-10-CM | POA: Diagnosis not present

## 2018-04-24 DIAGNOSIS — I130 Hypertensive heart and chronic kidney disease with heart failure and stage 1 through stage 4 chronic kidney disease, or unspecified chronic kidney disease: Secondary | ICD-10-CM | POA: Diagnosis not present

## 2018-04-24 DIAGNOSIS — H8112 Benign paroxysmal vertigo, left ear: Secondary | ICD-10-CM | POA: Diagnosis not present

## 2018-04-25 LAB — CUP PACEART REMOTE DEVICE CHECK
Battery Voltage: 2.92 V
Brady Statistic AP VP Percent: 0.93 %
Brady Statistic AP VS Percent: 0.02 %
Brady Statistic AS VP Percent: 98.96 %
Brady Statistic RA Percent Paced: 0.94 %
HighPow Impedance: 39 Ohm
HighPow Impedance: 50 Ohm
Implantable Lead Implant Date: 20071119
Implantable Lead Location: 753858
Implantable Lead Location: 753859
Implantable Lead Model: 4193
Implantable Lead Model: 5076
Implantable Lead Model: 6947
Lead Channel Impedance Value: 304 Ohm
Lead Channel Impedance Value: 361 Ohm
Lead Channel Impedance Value: 4047 Ohm
Lead Channel Pacing Threshold Amplitude: 0.5 V
Lead Channel Pacing Threshold Amplitude: 2.75 V
Lead Channel Sensing Intrinsic Amplitude: 1.125 mV
Lead Channel Sensing Intrinsic Amplitude: 1.125 mV
Lead Channel Sensing Intrinsic Amplitude: 2.25 mV
Lead Channel Setting Pacing Amplitude: 2 V
Lead Channel Setting Pacing Amplitude: 3.75 V
Lead Channel Setting Pacing Pulse Width: 0.4 ms
Lead Channel Setting Pacing Pulse Width: 0.6 ms
MDC IDC LEAD IMPLANT DT: 20050525
MDC IDC LEAD IMPLANT DT: 20050525
MDC IDC LEAD LOCATION: 753860
MDC IDC MSMT BATTERY REMAINING LONGEVITY: 19 mo
MDC IDC MSMT LEADCHNL LV IMPEDANCE VALUE: 4047 Ohm
MDC IDC MSMT LEADCHNL LV IMPEDANCE VALUE: 589 Ohm
MDC IDC MSMT LEADCHNL LV PACING THRESHOLD PULSEWIDTH: 0.6 ms
MDC IDC MSMT LEADCHNL RA IMPEDANCE VALUE: 456 Ohm
MDC IDC MSMT LEADCHNL RA PACING THRESHOLD AMPLITUDE: 1.5 V
MDC IDC MSMT LEADCHNL RA PACING THRESHOLD PULSEWIDTH: 0.4 ms
MDC IDC MSMT LEADCHNL RA SENSING INTR AMPL: 2.25 mV
MDC IDC MSMT LEADCHNL RV PACING THRESHOLD PULSEWIDTH: 0.4 ms
MDC IDC PG IMPLANT DT: 20141105
MDC IDC SESS DTM: 20191022161744
MDC IDC SET LEADCHNL RA PACING AMPLITUDE: 2.25 V
MDC IDC SET LEADCHNL RV SENSING SENSITIVITY: 0.3 mV
MDC IDC STAT BRADY AS VS PERCENT: 0.09 %
MDC IDC STAT BRADY RV PERCENT PACED: 99.67 %

## 2018-04-26 DIAGNOSIS — W19XXXD Unspecified fall, subsequent encounter: Secondary | ICD-10-CM | POA: Diagnosis not present

## 2018-04-26 DIAGNOSIS — I130 Hypertensive heart and chronic kidney disease with heart failure and stage 1 through stage 4 chronic kidney disease, or unspecified chronic kidney disease: Secondary | ICD-10-CM | POA: Diagnosis not present

## 2018-04-26 DIAGNOSIS — I5022 Chronic systolic (congestive) heart failure: Secondary | ICD-10-CM | POA: Diagnosis not present

## 2018-04-26 DIAGNOSIS — S82842D Displaced bimalleolar fracture of left lower leg, subsequent encounter for closed fracture with routine healing: Secondary | ICD-10-CM | POA: Diagnosis not present

## 2018-04-26 DIAGNOSIS — N184 Chronic kidney disease, stage 4 (severe): Secondary | ICD-10-CM | POA: Diagnosis not present

## 2018-04-26 DIAGNOSIS — H8112 Benign paroxysmal vertigo, left ear: Secondary | ICD-10-CM | POA: Diagnosis not present

## 2018-04-29 ENCOUNTER — Ambulatory Visit (INDEPENDENT_AMBULATORY_CARE_PROVIDER_SITE_OTHER): Payer: Medicare Other | Admitting: Orthopedic Surgery

## 2018-04-29 ENCOUNTER — Encounter (INDEPENDENT_AMBULATORY_CARE_PROVIDER_SITE_OTHER): Payer: Self-pay | Admitting: Orthopedic Surgery

## 2018-04-29 VITALS — Ht 62.0 in | Wt 172.0 lb

## 2018-04-29 DIAGNOSIS — L97521 Non-pressure chronic ulcer of other part of left foot limited to breakdown of skin: Secondary | ICD-10-CM | POA: Diagnosis not present

## 2018-04-29 NOTE — Progress Notes (Signed)
Office Visit Note   Patient: Casey [name]           Date of Birth: 07-29-1934           MRN: [id number] Visit Date: 04/29/2018              Requested by: Elby Showers, MD [address] [place], [place] [address] PCP: Elby Showers, MD  Chief Complaint  Patient presents with  . Left Foot - Wound Check, Follow-up      HPI: Patient is a 83 year old woman who presents status post open reduction internal fixation of her left ankle with an ischemic ulcer dorsally over the foot.  Patient has been doing wound care she states it continues to improve she denies any pain drainage or swelling.  Assessment & Plan: Visit Diagnoses:  1. Non-pressure chronic ulcer of other part of left foot limited to breakdown of skin ([place])     Plan: Patient will continue work on Achilles stretching continue with regular shoewear she was given instructions for scar massage.  Follow-Up Instructions: Return in about 4 weeks (around 05/27/2018).   Ortho Exam  Patient is alert, oriented, no adenopathy, well-dressed, normal affect, normal respiratory effort. Examination patient has excellent improvement of the ischemic dorsal left foot ulcer.  There is an area of 1 cm diameter with some thin eschar the remainder has good epithelialization.  There is no swelling no tenderness to palpation no signs of infection.  Imaging: No results found. No images are attached to the encounter.  Labs: Lab Results  Component Value Date   HGBA1C 5.6 03/21/2018   HGBA1C 5.5 06/13/2016   HGBA1C 5.8 (H) 12/07/2015   LABURIC 11.5 (H) 12/21/2017   LABURIC 8.0 (H) 07/18/2011     Lab Results  Component Value Date   ALBUMIN 4.4 12/21/2017   ALBUMIN 3.7 09/11/2017   ALBUMIN 3.7 07/06/2017   LABURIC 11.5 (H) 12/21/2017   LABURIC 8.0 (H) 07/18/2011    Body mass index is 31.46 kg/m.  Orders:  No orders of the defined types were placed in this encounter.  No orders of the defined types were placed in  this encounter.    Procedures: No procedures performed  Clinical Data: No additional findings.  ROS:  All other systems negative, except as noted in the HPI. Review of Systems  Objective: Vital Signs: Ht 5\' 2"  (1.575 m)   Wt 172 lb (78 kg)   BMI 31.46 kg/m   Specialty Comments:  No specialty comments available.  PMFS History: Patient Active Problem List   Diagnosis Date Noted  . Benign paroxysmal positional vertigo of left ear 02/14/2018  . Neurocognitive deficits 01/22/2018  . Closed displaced bimalleolar fracture of left lower leg   . History of peptic ulcer disease   . Atherosclerosis of aorta ([place])   . Bilateral hearing loss 12/09/2015  . Kidney disease, chronic, stage III (GFR 30-59 ml/min) (HCC)   . Ventricular septal defect (VSD)   . Long-term (current) use of anticoagulants   . Hypothyroidism 03/11/2013  . Gout   . Vitamin D deficiency 03/06/2011  . Glaucoma   . Anxiety   . Spinal stenosis   . Biventricular implantable cardioverter-defibrillator in situ   . Nonischemic cardiomyopathy ([place])   . Paroxysmal atrial fibrillation (HCC)   . Hyperlipidemia   . Ventricular tachycardia   . Hypertensive heart disease   . Chronic systolic CHF (congestive heart failure) (HCC)    Past Medical History:  Diagnosis Date  .  Atherosclerosis of aorta ([place])   . Biventricular ICD (implantable cardiac defibrillator) in place    medtronic   . Chronic kidney disease (CKD), stage III (moderate) (HCC)   . Chronic systolic heart failure ([place])   . Gout   . History of peptic ulcer disease   . Hypertension   . Hypothyroidism   . Lumbar disc disease   . Nonischemic cardiomyopathy ([place])   . Ventricular septal defect   . VT (ventricular tachycardia) ([place])    appropriate therapy via ICD 2013    Family History  Problem Relation Age of Onset  . Heart failure Mother   . Cancer Father     Past Surgical History:  Procedure Laterality Date  . ABDOMINAL HYSTERECTOMY    . BIV  ICD GENERTAOR CHANGE OUT N/A 04/16/2013   Procedure: BIV ICD GENERTAOR CHANGE OUT;  Surgeon: Deboraha Sprang, MD;  Location: [facility] C[facility] LAB;  Service: Cardiovascular;  Laterality: N/A;  . CESAREAN SECTION    . FEMUR IM NAIL    . ORIF ANKLE FRACTURE Left 01/04/2018   Procedure: OPEN REDUCTION INTERNAL FIXATION (ORIF) ANKLE FRACTURE;  Surgeon: Meredith Pel, MD;  Location: [place];  Service: Orthopedics;  Laterality: Left;  . TEE WITHOUT CARDIOVERSION N/A 10/31/2017   Procedure: TRANSESOPHAGEAL ECHOCARDIOGRAM (TEE);  Surgeon: Larey Dresser, MD;  Location: [facility] ENDOSCOPY;  Service: Cardiovascular;  Laterality: N/A;   Social History   Occupational History  . Not on file  Tobacco Use  . Smoking status: Never Smoker  . Smokeless tobacco: Never Used  Substance and Sexual Activity  . Alcohol use: No  . Drug use: No  . Sexual activity: Not on file

## 2018-05-01 DIAGNOSIS — N184 Chronic kidney disease, stage 4 (severe): Secondary | ICD-10-CM | POA: Diagnosis not present

## 2018-05-01 DIAGNOSIS — H8112 Benign paroxysmal vertigo, left ear: Secondary | ICD-10-CM | POA: Diagnosis not present

## 2018-05-01 DIAGNOSIS — I130 Hypertensive heart and chronic kidney disease with heart failure and stage 1 through stage 4 chronic kidney disease, or unspecified chronic kidney disease: Secondary | ICD-10-CM | POA: Diagnosis not present

## 2018-05-01 DIAGNOSIS — W19XXXD Unspecified fall, subsequent encounter: Secondary | ICD-10-CM | POA: Diagnosis not present

## 2018-05-01 DIAGNOSIS — S82842D Displaced bimalleolar fracture of left lower leg, subsequent encounter for closed fracture with routine healing: Secondary | ICD-10-CM | POA: Diagnosis not present

## 2018-05-01 DIAGNOSIS — I5022 Chronic systolic (congestive) heart failure: Secondary | ICD-10-CM | POA: Diagnosis not present

## 2018-05-03 DIAGNOSIS — I130 Hypertensive heart and chronic kidney disease with heart failure and stage 1 through stage 4 chronic kidney disease, or unspecified chronic kidney disease: Secondary | ICD-10-CM | POA: Diagnosis not present

## 2018-05-03 DIAGNOSIS — N184 Chronic kidney disease, stage 4 (severe): Secondary | ICD-10-CM | POA: Diagnosis not present

## 2018-05-03 DIAGNOSIS — S82842D Displaced bimalleolar fracture of left lower leg, subsequent encounter for closed fracture with routine healing: Secondary | ICD-10-CM | POA: Diagnosis not present

## 2018-05-03 DIAGNOSIS — I5022 Chronic systolic (congestive) heart failure: Secondary | ICD-10-CM | POA: Diagnosis not present

## 2018-05-03 DIAGNOSIS — H8112 Benign paroxysmal vertigo, left ear: Secondary | ICD-10-CM | POA: Diagnosis not present

## 2018-05-03 DIAGNOSIS — W19XXXD Unspecified fall, subsequent encounter: Secondary | ICD-10-CM | POA: Diagnosis not present

## 2018-05-08 DIAGNOSIS — H8112 Benign paroxysmal vertigo, left ear: Secondary | ICD-10-CM | POA: Diagnosis not present

## 2018-05-08 DIAGNOSIS — I5022 Chronic systolic (congestive) heart failure: Secondary | ICD-10-CM | POA: Diagnosis not present

## 2018-05-08 DIAGNOSIS — I130 Hypertensive heart and chronic kidney disease with heart failure and stage 1 through stage 4 chronic kidney disease, or unspecified chronic kidney disease: Secondary | ICD-10-CM | POA: Diagnosis not present

## 2018-05-08 DIAGNOSIS — W19XXXD Unspecified fall, subsequent encounter: Secondary | ICD-10-CM | POA: Diagnosis not present

## 2018-05-08 DIAGNOSIS — S82842D Displaced bimalleolar fracture of left lower leg, subsequent encounter for closed fracture with routine healing: Secondary | ICD-10-CM | POA: Diagnosis not present

## 2018-05-08 DIAGNOSIS — N184 Chronic kidney disease, stage 4 (severe): Secondary | ICD-10-CM | POA: Diagnosis not present

## 2018-05-17 ENCOUNTER — Encounter (HOSPITAL_COMMUNITY): Payer: Medicare Other

## 2018-05-24 ENCOUNTER — Encounter (HOSPITAL_COMMUNITY): Payer: Medicare Other

## 2018-05-27 ENCOUNTER — Encounter (INDEPENDENT_AMBULATORY_CARE_PROVIDER_SITE_OTHER): Payer: Self-pay | Admitting: Orthopedic Surgery

## 2018-05-27 ENCOUNTER — Ambulatory Visit (INDEPENDENT_AMBULATORY_CARE_PROVIDER_SITE_OTHER): Payer: Medicare Other | Admitting: Orthopedic Surgery

## 2018-05-27 VITALS — Ht 62.0 in | Wt 172.0 lb

## 2018-05-27 DIAGNOSIS — L97521 Non-pressure chronic ulcer of other part of left foot limited to breakdown of skin: Secondary | ICD-10-CM | POA: Diagnosis not present

## 2018-05-27 NOTE — Progress Notes (Signed)
Office Visit Note   Patient: Casey [name]           Date of Birth: [birth year]           MRN: [id number] Visit Date: 05/27/2018              Requested by: Elby Showers, MD [address] [place], [place] [address] PCP: Elby Showers, MD  Chief Complaint  Patient presents with  . Left Foot - Follow-up      HPI: Patient is a 83 year old woman who is seen in follow-up for ulceration dorsum of the left foot.  Patient states she continues to have slow steady healing.  She denies any pain.  Assessment & Plan: Visit Diagnoses:  1. Non-pressure chronic ulcer of other part of left foot limited to breakdown of skin ([place])     Plan: Patient will wash with soap and water use the antibiotic ointment and a Band-Aid change daily follow-up in the office in 4 weeks.  Follow-Up Instructions: Return in about 4 weeks (around 06/24/2018).   Ortho Exam  Patient is alert, oriented, no adenopathy, well-dressed, normal affect, normal respiratory effort. Examination the superficial eschar over the dorsum of the left foot is stable.  This appears to be lifting off from the edges.  After informed consent a 10 blade knife was used to debride the skin and soft tissue back to bleeding viable granulation tissue around the wound edges.  The wound is 10 x 20 mm and 2 mm deep there is approximately 25% granulation tissue around the wound edges 75% fibrinous tissue at the base of the wound.  Silver nitrate was used for hemostasis.  Iodosorb and a Band-Aid was applied.  Imaging: No results found. No images are attached to the encounter.  Labs: Lab Results  Component Value Date   HGBA1C 5.6 03/21/2018   HGBA1C 5.5 06/13/2016   HGBA1C 5.8 (H) 12/07/2015   LABURIC 11.5 (H) 12/21/2017   LABURIC 8.0 (H) 07/18/2011     Lab Results  Component Value Date   ALBUMIN 4.4 12/21/2017   ALBUMIN 3.7 09/11/2017   ALBUMIN 3.7 07/06/2017   LABURIC 11.5 (H) 12/21/2017   LABURIC 8.0 (H) 07/18/2011     Body mass index is 31.46 kg/m.  Orders:  No orders of the defined types were placed in this encounter.  No orders of the defined types were placed in this encounter.    Procedures: No procedures performed  Clinical Data: No additional findings.  ROS:  All other systems negative, except as noted in the HPI. Review of Systems  Objective: Vital Signs: Ht 5\' 2"  (1.575 m)   Wt 172 lb (78 kg)   BMI 31.46 kg/m   Specialty Comments:  No specialty comments available.  PMFS History: Patient Active Problem List   Diagnosis Date Noted  . Benign paroxysmal positional vertigo of left ear 02/14/2018  . Neurocognitive deficits 01/22/2018  . Closed displaced bimalleolar fracture of left lower leg   . History of peptic ulcer disease   . Atherosclerosis of aorta ([place])   . Bilateral hearing loss 12/09/2015  . Kidney disease, chronic, stage III (GFR 30-59 ml/min) (HCC)   . Ventricular septal defect (VSD)   . Long-term (current) use of anticoagulants   . Hypothyroidism 03/11/2013  . Gout   . Vitamin D deficiency 03/06/2011  . Glaucoma   . Anxiety   . Spinal stenosis   . Biventricular implantable cardioverter-defibrillator in situ   . Nonischemic cardiomyopathy ([place])   .  Paroxysmal atrial fibrillation (HCC)   . Hyperlipidemia   . Ventricular tachycardia   . Hypertensive heart disease   . Chronic systolic CHF (congestive heart failure) (HCC)    Past Medical History:  Diagnosis Date  . Atherosclerosis of aorta ([place])   . Biventricular ICD (implantable cardiac defibrillator) in place    medtronic   . Chronic kidney disease (CKD), stage III (moderate) (HCC)   . Chronic systolic heart failure ([place])   . Gout   . History of peptic ulcer disease   . Hypertension   . Hypothyroidism   . Lumbar disc disease   . Nonischemic cardiomyopathy ([employer])   . Ventricular septal defect   . VT (ventricular tachycardia) ([place])    appropriate therapy via ICD 2013    Family History  Problem  Relation Age of Onset  . Heart failure Mother   . Cancer Father     Past Surgical History:  Procedure Laterality Date  . ABDOMINAL HYSTERECTOMY    . BIV ICD GENERTAOR CHANGE OUT N/A 04/16/2013   Procedure: BIV ICD GENERTAOR CHANGE OUT;  Surgeon: Deboraha Sprang, MD;  [facility] LAB;  Service: Cardiovascular;  Laterality: N/A;  . CESAREAN SECTION    . FEMUR IM NAIL    . ORIF ANKLE FRACTURE Left 01/04/2018   Procedure: OPEN REDUCTION INTERNAL FIXATION (ORIF) ANKLE FRACTURE;  Surgeon: Meredith Pel, MD;  Location: [place] [facility];  Service: Orthopedics;  Laterality: Left;  . TEE WITHOUT CARDIOVERSION N/A 10/31/2017   Procedure: TRANSESOPHAGEAL ECHOCARDIOGRAM (TEE);  Surgeon: Larey Dresser, MD;  Location: [facility] ENDOSCOPY;  Service: Cardiovascular;  Laterality: N/A;   Social History   Occupational History  . Not on file  Tobacco Use  . Smoking status: Never Smoker  . Smokeless tobacco: Never Used  Substance and Sexual Activity  . Alcohol use: No  . Drug use: No  . Sexual activity: Not on file

## 2018-05-29 ENCOUNTER — Telehealth: Payer: Self-pay | Admitting: Cardiology

## 2018-05-29 NOTE — Telephone Encounter (Signed)
° ° °  1. Which medications need to be refilled? (please list name of each medication and dose if known) Amiodarone 200mg  tablet once daily  2. Which pharmacy/location (including street and city if local pharmacy) is medication to be sent to? [place]  3. Do they need a 30 day or 90 day supply? [place]

## 2018-05-29 NOTE — Telephone Encounter (Signed)
Patient reports her primary care doctor has been refilling her medications and she doesn't need [place] to. She also has plans to begin seeing Dr. Marigene Ehlers.

## 2018-06-17 ENCOUNTER — Encounter: Payer: Self-pay | Admitting: Internal Medicine

## 2018-06-17 ENCOUNTER — Ambulatory Visit (INDEPENDENT_AMBULATORY_CARE_PROVIDER_SITE_OTHER): Payer: Medicare Other | Admitting: Internal Medicine

## 2018-06-17 ENCOUNTER — Telehealth: Payer: Self-pay | Admitting: Cardiology

## 2018-06-17 VITALS — BP 120/90 | HR 74 | Temp 98.4°F | Ht 62.0 in | Wt 169.0 lb

## 2018-06-17 DIAGNOSIS — Z7901 Long term (current) use of anticoagulants: Secondary | ICD-10-CM

## 2018-06-17 DIAGNOSIS — M7989 Other specified soft tissue disorders: Secondary | ICD-10-CM

## 2018-06-17 DIAGNOSIS — I428 Other cardiomyopathies: Secondary | ICD-10-CM

## 2018-06-17 DIAGNOSIS — M109 Gout, unspecified: Secondary | ICD-10-CM

## 2018-06-17 MED ORDER — PREDNISONE 20 MG PO TABS: 20.0000 mg | ORAL_TABLET | Freq: Every day | ORAL | 0 refills | Status: DC

## 2018-06-17 MED ORDER — ALLOPURINOL 100 MG PO TABS: 100.0000 mg | ORAL_TABLET | Freq: Every day | ORAL | 6 refills | Status: DC

## 2018-06-17 NOTE — Patient Instructions (Addendum)
Take prednisone in tapering course as directed for gout.  Pro time drawn today for follow-up on chronic anticoagulation.  Restart allopurinol at low-dose 100 mg daily.  Amiodarone refilled.  Follow-up with chronic anticoagulation with pro time INR in 2 weeks.

## 2018-06-17 NOTE — Telephone Encounter (Signed)
°  1. Which medications need to be refilled? (please list name of each medication and dose if known) Amiodarone 200mg  tablet once daily  2. Which pharmacy/location (including street and city if local pharmacy) is medication to be sent to?[place] [address] [facility]  3. Do they need a 30 day or 90 day supply? [place]

## 2018-06-17 NOTE — Progress Notes (Signed)
   Subjective:    Patient ID: Casey [name], female    DOB: Jan 16, 1935, 84 y.o.   MRN: [id number]  HPI 84 year old Female in today with 4 day history right great toe pain. Hx gout in the remote past. Used to take Allopurinol in the past.  History of chronic anticoagulation and pro time INR drawn today.    Review of Systems also out of amiodarone and needs refill     Objective:   Physical Exam   She has a tender red right great toe consistent with gouty arthritis.  Chest clear.  Cardiac exam regular rate and rhythm.  Extremities without edema.      Assessment & Plan:  Acute gout right great toe  Chronic anticoagulation currently on Coumadin 2 mg daily  Nonischemic cardiomyopathy-amiodarone refilled  Plan: Restart allopurinol 100 mg daily.  Prednisone 20 mg daily for 5 days for acute gout.  Pro time INR drawn on chronic anticoagulation with further instructions to follow.  Addendum-pro time INR 1.4.  She may have missed some doses of Coumadin.  She is supposed to be on 2 mg daily.  Recheck in 2 weeks.  Addendum: Pro time INR 1.6 on Coumadin 10 mg daily drawn on January 20.  Increase to 4 mg daily and follow-up in 2 weeks

## 2018-06-18 LAB — CBC WITH DIFFERENTIAL/PLATELET
ABSOLUTE MONOCYTES: 698 {cells}/uL (ref 200–950)
BASOS ABS: 58 {cells}/uL (ref 0–200)
BASOS PCT: 0.9 %
EOS ABS: 147 {cells}/uL (ref 15–500)
Eosinophils Relative: 2.3 %
HCT: 44 % (ref 35.0–45.0)
HEMOGLOBIN: 14.8 g/dL (ref 11.7–15.5)
Lymphs Abs: 1370 cells/uL (ref 850–3900)
MCH: 32.7 pg (ref 27.0–33.0)
MCHC: 33.6 g/dL (ref 32.0–36.0)
MCV: 97.3 fL (ref 80.0–100.0)
MPV: 12.2 fL (ref 7.5–12.5)
Monocytes Relative: 10.9 %
Neutro Abs: 4128 cells/uL (ref 1500–7800)
Neutrophils Relative %: 64.5 %
PLATELETS: 105 10*3/uL — AB (ref 140–400)
RBC: 4.52 10*6/uL (ref 3.80–5.10)
RDW: 11.9 % (ref 11.0–15.0)
TOTAL LYMPHOCYTE: 21.4 %
WBC: 6.4 10*3/uL (ref 3.8–10.8)

## 2018-06-18 LAB — URIC ACID: URIC ACID, SERUM: 10.8 mg/dL — AB (ref 2.5–7.0)

## 2018-06-18 LAB — PROTIME-INR
INR: 1.4 — AB
PROTHROMBIN TIME: 13.8 s — AB (ref 9.0–11.5)

## 2018-06-18 NOTE — Telephone Encounter (Signed)
Called patient to verify refill. She reports she has seen her primary care doctor and they have refilled this for her yesterday. She doesn't want an appointment here in [place] it is too far for her and she reports her primary care doctor is handling her care.

## 2018-06-24 ENCOUNTER — Ambulatory Visit (INDEPENDENT_AMBULATORY_CARE_PROVIDER_SITE_OTHER): Payer: Medicare Other | Admitting: Orthopedic Surgery

## 2018-06-27 ENCOUNTER — Other Ambulatory Visit: Payer: Self-pay

## 2018-06-27 ENCOUNTER — Telehealth: Payer: Self-pay | Admitting: Cardiology

## 2018-06-27 MED ORDER — AMIODARONE HCL 200 MG PO TABS: 200.0000 mg | ORAL_TABLET | Freq: Every day | ORAL | 0 refills | Status: DC

## 2018-06-27 NOTE — Telephone Encounter (Addendum)
30 days of Amiodarone has been sent to the pharmacy. Patient is well over due for a follow up and needs to be seen by a Cardiologist. Will have the front desk staff try to reach out to the patient as it appears she has already been contact about her refill and needing a refill.   PCP Note: Coumadin 4 mg prescribed was taking 2 mg daily and INR sub therapeutic. Spoke with pt. Is to return in 2 weeks here for Protime INR.

## 2018-06-27 NOTE — Telephone Encounter (Signed)
°  1. Which medications need to be refilled? (please list name of each medication and dose if known) Amiodarone 200mg  tablets  2. Which pharmacy/location (including street and city if local pharmacy) is medication to be sent to?[place] [address] [facility]  3. Do they need a 30 day or 90 day supply? [place]

## 2018-07-01 ENCOUNTER — Other Ambulatory Visit: Payer: Medicare Other | Admitting: Internal Medicine

## 2018-07-01 DIAGNOSIS — Z7901 Long term (current) use of anticoagulants: Secondary | ICD-10-CM | POA: Diagnosis not present

## 2018-07-02 ENCOUNTER — Ambulatory Visit (INDEPENDENT_AMBULATORY_CARE_PROVIDER_SITE_OTHER): Payer: Medicare Other

## 2018-07-02 DIAGNOSIS — I428 Other cardiomyopathies: Secondary | ICD-10-CM | POA: Diagnosis not present

## 2018-07-02 DIAGNOSIS — I5022 Chronic systolic (congestive) heart failure: Secondary | ICD-10-CM

## 2018-07-02 LAB — PROTIME-INR
INR: 1.6 — ABNORMAL HIGH
Prothrombin Time: 15.9 s — ABNORMAL HIGH (ref 9.0–11.5)

## 2018-07-02 MED ORDER — WARFARIN SODIUM 4 MG PO TABS: 4.0000 mg | ORAL_TABLET | Freq: Every day | ORAL | 0 refills | Status: DC

## 2018-07-03 NOTE — Progress Notes (Signed)
Remote ICD transmission.   

## 2018-07-04 LAB — CUP PACEART REMOTE DEVICE CHECK
Battery Remaining Longevity: 18 mo
Brady Statistic AP VP Percent: 2.98 %
Brady Statistic AP VS Percent: 0.02 %
Brady Statistic AS VP Percent: 96.94 %
Brady Statistic AS VS Percent: 0.07 %
Brady Statistic RA Percent Paced: 2.99 %
Brady Statistic RV Percent Paced: 99.68 %
Date Time Interrogation Session: 20200121180617
HighPow Impedance: 43 Ohm
HighPow Impedance: 55 Ohm
Implantable Lead Implant Date: 20050525
Implantable Lead Implant Date: 20050525
Implantable Lead Implant Date: 20071119
Implantable Lead Location: 753858
Implantable Lead Location: 753859
Implantable Lead Location: 753860
Implantable Lead Model: 5076
Implantable Lead Model: 6947
Implantable Pulse Generator Implant Date: 20141105
Lead Channel Impedance Value: 304 Ohm
Lead Channel Impedance Value: 399 Ohm
Lead Channel Impedance Value: 4047 Ohm
Lead Channel Impedance Value: 4047 Ohm
Lead Channel Impedance Value: 475 Ohm
Lead Channel Impedance Value: 589 Ohm
Lead Channel Pacing Threshold Amplitude: 0.5 V
Lead Channel Pacing Threshold Amplitude: 1.25 V
Lead Channel Pacing Threshold Amplitude: 2.375 V
Lead Channel Pacing Threshold Pulse Width: 0.4 ms
Lead Channel Pacing Threshold Pulse Width: 0.4 ms
Lead Channel Pacing Threshold Pulse Width: 0.6 ms
Lead Channel Sensing Intrinsic Amplitude: 1.875 mV
Lead Channel Sensing Intrinsic Amplitude: 1.875 mV
Lead Channel Setting Pacing Amplitude: 2 V
Lead Channel Setting Pacing Amplitude: 2 V
Lead Channel Setting Pacing Amplitude: 3.5 V
Lead Channel Setting Pacing Pulse Width: 0.4 ms
Lead Channel Setting Pacing Pulse Width: 0.6 ms
MDC IDC MSMT BATTERY VOLTAGE: 2.91 V
MDC IDC MSMT LEADCHNL RA SENSING INTR AMPL: 2.625 mV
MDC IDC MSMT LEADCHNL RA SENSING INTR AMPL: 2.625 mV
MDC IDC SET LEADCHNL RV SENSING SENSITIVITY: 0.3 mV

## 2018-07-05 ENCOUNTER — Encounter: Payer: Self-pay | Admitting: Cardiology

## 2018-07-15 ENCOUNTER — Encounter: Payer: Self-pay | Admitting: Internal Medicine

## 2018-07-15 ENCOUNTER — Ambulatory Visit (INDEPENDENT_AMBULATORY_CARE_PROVIDER_SITE_OTHER): Payer: Medicare Other | Admitting: Internal Medicine

## 2018-07-15 VITALS — BP 130/90 | HR 74 | Ht 62.0 in | Wt 173.0 lb

## 2018-07-15 DIAGNOSIS — I428 Other cardiomyopathies: Secondary | ICD-10-CM

## 2018-07-15 DIAGNOSIS — R791 Abnormal coagulation profile: Secondary | ICD-10-CM

## 2018-07-15 DIAGNOSIS — Z7901 Long term (current) use of anticoagulants: Secondary | ICD-10-CM

## 2018-07-15 NOTE — Progress Notes (Signed)
   Subjective:    Patient ID: Casey [name], female    DOB: 1935/06/09, 84 y.o.   MRN: [id number]  HPI In today for check on chronic anticoagulation. Has many questions about foods not to eat while on Coumadin. Education provided. Eats a lot of broccoli.     Review of Systems see above.     Objective:   Physical Exam  Vital signs reviewed. Chest Clear. Cor: RRR Ext without edema.      Assessment & Plan:  Chronic anticoagulation for nonischemic cardiomyopathy Pro time INR sent stat to [facility].  Plan: Pro time INR drawn and pending with results to follow.  Addendum: Pro time INR is 5 on Coumadin 4 mg daily.  At last visit pro time INR was 1.6 on Coumadin 2 mg daily. She is to stop Coumadin for 3 days then restart Coumadin 2 mg daily and stop eating so much broccoli.  Follow-up in 7 days.  She will be seen here for pro time INR.  Her son brings her as he is off on Mondays.

## 2018-07-16 LAB — PROTIME-INR
INR: 5 — ABNORMAL HIGH
Prothrombin Time: 46.4 s — ABNORMAL HIGH (ref 9.0–11.5)

## 2018-07-18 ENCOUNTER — Telehealth: Payer: Self-pay | Admitting: Cardiology

## 2018-07-18 NOTE — Telephone Encounter (Signed)
Patient reports, again that we do not need to refill her medication. She will not be seeing [place] as it is not convienent in location for her. Her pcp is refilling her medications

## 2018-07-18 NOTE — Telephone Encounter (Signed)
°  1. Which medications need to be refilled? (please list name of each medication and dose if known) Potassium CL 23meq er tablet  2. Which pharmacy/location (including street and city if local pharmacy) is medication to be sent to? [facility] on [address] [place]  3. Do they need a 30 day or 90 day supply? Casey [name]

## 2018-07-22 ENCOUNTER — Other Ambulatory Visit: Payer: Self-pay

## 2018-07-22 ENCOUNTER — Ambulatory Visit (INDEPENDENT_AMBULATORY_CARE_PROVIDER_SITE_OTHER): Payer: Medicare Other | Admitting: Internal Medicine

## 2018-07-22 ENCOUNTER — Encounter: Payer: Self-pay | Admitting: Internal Medicine

## 2018-07-22 VITALS — BP 130/90 | HR 80 | Ht 62.0 in

## 2018-07-22 DIAGNOSIS — I428 Other cardiomyopathies: Secondary | ICD-10-CM | POA: Diagnosis not present

## 2018-07-22 DIAGNOSIS — Z7901 Long term (current) use of anticoagulants: Secondary | ICD-10-CM | POA: Diagnosis not present

## 2018-07-22 LAB — PROTIME-INR
INR: 2.4 — ABNORMAL HIGH
PROTHROMBIN TIME: 23.7 s — AB (ref 9.0–11.5)

## 2018-07-22 MED ORDER — POTASSIUM CHLORIDE CRYS ER 20 MEQ PO TBCR: 20.0000 meq | EXTENDED_RELEASE_TABLET | Freq: Every day | ORAL | 1 refills | Status: DC

## 2018-07-22 NOTE — Progress Notes (Signed)
   Subjective:    Patient ID: Casey [name], female    DOB: 1935/03/11, 84 y.o.   MRN: [id number]  HPI For follow up on Coumadin therapy.  Pro time was 5.0 on February 3.  Coumadin was held for 3 days then she was to start Coumadin 2 mg daily which she did on Friday, February 7.  She has had 3 doses of the lower dose of Coumadin 2 mg daily.  She is here today for follow-up.  Previously we had had issues with subtherapeutic INR levels.  That is why we increased her to 4 mg of Coumadin daily.    Review of Systems     Objective:   Physical Exam Vital signs obtained.  Patient spoken with briefly by physician regarding Coumadin dose.  Also seen by nurse and phlebotomist       Assessment & Plan:  Chronic anticoagulation-currently on Coumadin 2 mg daily  Nonischemic cardiomyopathy  Plan: Pro time INR drawn and pending.

## 2018-07-22 NOTE — Patient Instructions (Signed)
Currently on Coumadin 2 mg daily and pro time INR drawn and pending

## 2018-07-29 ENCOUNTER — Ambulatory Visit (INDEPENDENT_AMBULATORY_CARE_PROVIDER_SITE_OTHER): Payer: Medicare Other | Admitting: Internal Medicine

## 2018-07-29 ENCOUNTER — Encounter: Payer: Self-pay | Admitting: Internal Medicine

## 2018-07-29 VITALS — BP 130/90 | HR 77 | Temp 98.3°F | Ht 63.0 in | Wt 170.0 lb

## 2018-07-29 DIAGNOSIS — Z7901 Long term (current) use of anticoagulants: Secondary | ICD-10-CM

## 2018-07-29 NOTE — Progress Notes (Signed)
   Subjective:    Patient ID: Casey [name], female    DOB: Jul 10, 1934, 84 y.o.   MRN: [id number]  HPI Has stopped eating so much broccoli. Has reviewed dietary restrictions with Coumadin. Pacer is checked remotely. She is worried about battery life but notes say it is adequate. She will need Cardiology follow up since Dr. Wynonia Lawman retired. She would like to see Dr. Aundra Dubin.    Review of Systems see above- ambulating faily well with cane.     Objective:   Physical Exam  Not examined but spent 10 minutes answering questions about heart situation and Coumdin.      Assessment & Plan:  Chronic anticoagulation  Plan:Protime INR drawn and pending Will arrange for Cardiology follow up

## 2018-07-29 NOTE — Patient Instructions (Signed)
Protime INR drawn and pending with further instructions to follow

## 2018-07-30 LAB — PROTIME-INR
INR: 2.4 — ABNORMAL HIGH
Prothrombin Time: 23.8 s — ABNORMAL HIGH (ref 9.0–11.5)

## 2018-07-31 ENCOUNTER — Other Ambulatory Visit: Payer: Self-pay | Admitting: Cardiology

## 2018-08-03 NOTE — Patient Instructions (Signed)
She is to stop Coumadin for 3 days then restart Coumadin 2 mg daily.  Stop eating so much broccoli.  Follow-up in 7 days.

## 2018-08-12 ENCOUNTER — Other Ambulatory Visit (HOSPITAL_COMMUNITY): Payer: Self-pay

## 2018-08-12 MED ORDER — WARFARIN SODIUM 4 MG PO TABS: 2.0000 mg | ORAL_TABLET | Freq: Every day | ORAL | 3 refills | Status: DC

## 2018-08-26 ENCOUNTER — Ambulatory Visit: Payer: Medicare Other | Admitting: Internal Medicine

## 2018-08-30 ENCOUNTER — Encounter (HOSPITAL_COMMUNITY): Payer: Medicare Other

## 2018-08-30 ENCOUNTER — Ambulatory Visit: Payer: Medicare Other | Admitting: Internal Medicine

## 2018-09-02 ENCOUNTER — Other Ambulatory Visit (HOSPITAL_COMMUNITY): Payer: Self-pay | Admitting: Cardiology

## 2018-09-02 DIAGNOSIS — I5022 Chronic systolic (congestive) heart failure: Secondary | ICD-10-CM

## 2018-09-06 ENCOUNTER — Telehealth: Payer: Self-pay

## 2018-09-06 NOTE — Telephone Encounter (Signed)
Called Casey [name] Casey [name] did not show up for last 2 appointments we need to keep an eye on her PT/INR. Per Dr. Renold Genta Casey [name] needs to come for blood drawn and we can draw it in her car she does not need to come inside the building. Casey [name] stated she will ask her son to bring her and she will call me back to make an appointment.

## 2018-09-09 ENCOUNTER — Other Ambulatory Visit (INDEPENDENT_AMBULATORY_CARE_PROVIDER_SITE_OTHER): Payer: Medicare Other | Admitting: Internal Medicine

## 2018-09-09 ENCOUNTER — Telehealth: Payer: Self-pay

## 2018-09-09 DIAGNOSIS — Z7901 Long term (current) use of anticoagulants: Secondary | ICD-10-CM | POA: Diagnosis not present

## 2018-09-09 DIAGNOSIS — R791 Abnormal coagulation profile: Secondary | ICD-10-CM

## 2018-09-09 LAB — PROTIME-INR
INR: 1.5 — ABNORMAL HIGH
Prothrombin Time: 14.9 s — ABNORMAL HIGH (ref 9.0–11.5)

## 2018-09-09 MED ORDER — WARFARIN SODIUM 3 MG PO TABS: 3.0000 mg | ORAL_TABLET | Freq: Every day | ORAL | 0 refills | Status: DC

## 2018-09-09 NOTE — Telephone Encounter (Signed)
Spoke with patient notified of PT/INR results, we discontinued coumadin 2mg  and we increased it to 3mg  daily. Scheduled a follow up appointment with patient in one week.

## 2018-09-09 NOTE — Addendum Note (Signed)
Addended by: Mady Haagensen on: 09/09/2018 11:58 AM   Modules accepted: Orders

## 2018-09-09 NOTE — Addendum Note (Signed)
Addended by: Elby Showers on: 09/09/2018 12:03 PM   Modules accepted: Level of Service

## 2018-09-09 NOTE — Addendum Note (Signed)
Addended by: Mady Haagensen on: 09/09/2018 09:48 AM   Modules accepted: Orders

## 2018-09-09 NOTE — Patient Instructions (Signed)
Increase Coumadin from 2 to 3 mg daily and follow-up in 1 week.

## 2018-09-09 NOTE — Progress Notes (Addendum)
   Subjective:    Patient ID: Casey [name], female    DOB: June 21, 1934, 84 y.o.   MRN: [id number]  HPI 84 year old Female on chronic anticoagulation with Coumadin for nonischemic cardiomyopathy seen for pro time INR.  She was seen from her car due to COVID-19 outbreak.  Says she is doing well.  No complaints.  Says she has an appointment April 6 to see cardiologist.  Pro time INR drawn    Review of Systems see above     Objective:   Physical Exam  Not examined but spent 10 minutes with her. Seen outside in auto today.  INR is 1.5 currently on Coumadin 2 mg daily      Assessment & Plan:  Subtherapeutic pro time INR  Plan: Increase Coumadin to 3 mg daily and recheck pro time in 1 week

## 2018-09-12 ENCOUNTER — Ambulatory Visit (HOSPITAL_COMMUNITY)
Admission: RE | Admit: 2018-09-12 | Discharge: 2018-09-12 | Disposition: A | Discharge status: Home / Self Care | Payer: Medicare Other | Source: Ambulatory Visit | Attending: Cardiology | Admitting: Cardiology

## 2018-09-12 ENCOUNTER — Other Ambulatory Visit: Payer: Self-pay

## 2018-09-12 DIAGNOSIS — I340 Nonrheumatic mitral (valve) insufficiency: Secondary | ICD-10-CM

## 2018-09-12 DIAGNOSIS — I5022 Chronic systolic (congestive) heart failure: Secondary | ICD-10-CM | POA: Diagnosis not present

## 2018-09-12 DIAGNOSIS — H8112 Benign paroxysmal vertigo, left ear: Secondary | ICD-10-CM

## 2018-09-12 DIAGNOSIS — I700 Atherosclerosis of aorta: Secondary | ICD-10-CM

## 2018-09-12 DIAGNOSIS — N184 Chronic kidney disease, stage 4 (severe): Secondary | ICD-10-CM

## 2018-09-12 MED ORDER — ISOSORBIDE MONONITRATE ER 30 MG PO TB24: 30.0000 mg | ORAL_TABLET | Freq: Every day | ORAL | 1 refills | Status: DC

## 2018-09-12 MED ORDER — TORSEMIDE 20 MG PO TABS: 60.0000 mg | ORAL_TABLET | Freq: Every day | ORAL | 0 refills | Status: DC

## 2018-09-12 MED ORDER — METOPROLOL SUCCINATE ER 50 MG PO TB24: 50.0000 mg | ORAL_TABLET | Freq: Every day | ORAL | 1 refills | Status: DC

## 2018-09-12 MED ORDER — POTASSIUM CHLORIDE CRYS ER 20 MEQ PO TBCR: 20.0000 meq | EXTENDED_RELEASE_TABLET | Freq: Every day | ORAL | 1 refills | Status: DC

## 2018-09-12 MED ORDER — AMIODARONE HCL 200 MG PO TABS: 200.0000 mg | ORAL_TABLET | Freq: Every day | ORAL | 0 refills | Status: DC

## 2018-09-12 NOTE — Progress Notes (Signed)
Heart Failure TeleHealth Note  Due to national recommendations of social distancing due to [place] 19, telehealth visit is felt to be most appropriate for this patient at this time. I discussed the limitations, risks, security and privacy concerns of performing an evaluation and management service by telephone and the availability of in person appointments. I also discussed with the patient that there may be a patient responsible charge related to this service. The patient expressed understanding and agreed to proceed.  ID:  Casey [name], DOB 12/24/1934, MRN [id number]  Location: Home  Provider location: [address] [place] Type of Visit: Established patient  PCP:  Casey Showers, MD  Cardiologist:  Casey Standing, MD Primary HF: Dr Casey [name]  Chief Complaint: Chronic systolic HF   History of Present Illness: Casey [name] is a 84 y.o. female with long history of nonischemic cardiomyopathy, now CKD stage 3-4. She had cath in 5/05 with normal coronaries. She has MDT CRT-D device. Not on ACEI/ARB/ARNI due to renal dysfunction (tried all in the past). On warfarin with history of paroxysmal atrial fibrillation. Echo in 5/17 with EF 20%, moderate MR, moderate TR, small VSD.  Last seen in HF clinic 12/2017.  She presents via Psychiatric nurse for a telehealth visit today. She is doing well. Denies SOB or edema. She requests medication refills. She is very confused about the concept of a telephone visit and tells me she will have her daughter call me back.   Pt denies symptoms of cough, fevers, chills, or new SOB worrisome for COVID 19.   Past Medical History:  Diagnosis Date  . Atherosclerosis of aorta ([place])   . Biventricular ICD (implantable cardiac defibrillator) in place    medtronic   . Chronic kidney disease (CKD), stage III (moderate) (HCC)   . Chronic systolic heart failure ([place])   . Gout   . History of peptic ulcer disease   . Hypertension    . Hypothyroidism   . Lumbar disc disease   . Nonischemic cardiomyopathy ([place])   . Ventricular septal defect   . VT (ventricular tachycardia) ([place])    appropriate therapy via ICD 2013   Past Surgical History:  Procedure Laterality Date  . ABDOMINAL HYSTERECTOMY    . BIV ICD GENERTAOR CHANGE OUT N/A 04/16/2013   Procedure: BIV ICD GENERTAOR CHANGE OUT;  Surgeon: Deboraha Sprang, MD;  Location: [facility] CATH LAB;  Service: Cardiovascular;  Laterality: N/A;  . CESAREAN SECTION    . FEMUR IM NAIL    . ORIF ANKLE FRACTURE Left 01/04/2018   Procedure: OPEN REDUCTION INTERNAL FIXATION (ORIF) ANKLE FRACTURE;  Surgeon: Meredith Pel, MD;  Location: [place];  Service: Orthopedics;  Laterality: Left;  . TEE WITHOUT CARDIOVERSION N/A 10/31/2017   Procedure: TRANSESOPHAGEAL ECHOCARDIOGRAM (TEE);  Surgeon: Larey Dresser, MD;  Location: [facility] ENDOSCOPY;  Service: Cardiovascular;  Laterality: N/A;     Current Outpatient Medications  Medication Sig Dispense Refill  . allopurinol (ZYLOPRIM) 100 MG tablet Take 1 tablet (100 mg total) by mouth daily. 30 tablet 6  . amiodarone (PACERONE) 200 MG tablet Take 1 tablet (200 mg total) by mouth daily. 30 tablet 0  . dorzolamide (TRUSOPT) 2 % ophthalmic solution Place 1 drop into both eyes 3 (three) times daily. 10 mL 0  . hydrALAZINE (APRESOLINE) 25 MG tablet Take 1 tablet (25 mg total) by mouth every 12 (twelve) hours. 180 tablet 1  . isosorbide mononitrate (IMDUR) 30 MG 24 hr tablet  Take 1 tablet (30 mg total) by mouth daily. 90 tablet 1  . Latanoprost 0.005 % EMUL Place 1 drop into both eyes at bedtime. 2.5 mL 0  . levothyroxine (SYNTHROID, LEVOTHROID) 75 MCG tablet Take 1 tablet (75 mcg total) by mouth daily before breakfast. 90 tablet 1  . metoprolol succinate (TOPROL-XL) 50 MG 24 hr tablet Take 1 tablet (50 mg total) by mouth daily. Take with or immediately following a meal. 90 tablet 1  . Multiple Vitamins-Calcium (ONE-A-DAY WOMENS FORMULA) TABS Take 1 tablet  by mouth daily.      . potassium chloride SA (K-DUR,KLOR-CON) 20 MEQ tablet Take 1 tablet (20 mEq total) by mouth daily. 90 tablet 1  . torsemide (DEMADEX) 20 MG tablet Take 3 tablets (60 mg total) by mouth daily. Need follow up appt for future refills [phone] 90 tablet 0  . warfarin (COUMADIN) 3 MG tablet Take 1 tablet (3 mg total) by mouth daily. 30 tablet 0   No current facility-administered medications for this encounter.     Allergies:   Codeine   Social History:  The patient  reports that she has never smoked. She has never used smokeless tobacco. She reports that she does not drink alcohol or use drugs.   Family History:  The patient's family history includes Cancer in her father; Heart failure in her mother.   ROS:  Please see the history of present illness.   All other systems are personally reviewed and negative.   Exam:  [facility] Call) Lungs: Normal respiratory effort with conversation.  Neuro: Alert & oriented x 3.   Recent Labs: 03/21/2018: ALT 11; BUN 50; Creat 2.61; Potassium 4.3; Sodium 143; TSH 2.27 06/17/2018: Hemoglobin 14.8; Platelets 105  Personally reviewed   Wt Readings from Last 3 Encounters:  07/29/18 77.1 kg (170 lb)  07/15/18 78.5 kg (173 lb)  06/17/18 76.7 kg (169 lb)      ASSESSMENT AND PLAN:  1. Chronic systolic HF. NICM - TEE 10/2017 EF 15-20%, moderate MR - NYHA II. Volume sounds stable, but difficult to assess.  - Continue torsemide 60 mg daily - Continue Toprol XL 50 mg daily - Continue hydralazine 25 mg BID + imdur 30 mg daily. Encouraged her to take hydralazine BID instead of once daily.  - No ACEI/ARB/ARNI, dig, or spiro with CKD.   2. Atrial fibrillation - Continue amio 200 mg daily. LFTs and TSH normal 03/2018. Needs annual eye exams. Will add on repeat LFTs and TSH to PCP appointment for INR next week.  - Continue warfarin. Managed by PCP.   3. CKD IV - Add BMET to labs at PCP office next week.   4. Mitral regurgitation -  Mod functional MR by TEE 10/2017. Not a candidate for MitraClip.   COVID screen The patient does not have any symptoms that suggest any further testing/ screening at this time.  Social distancing reinforced today.  Patient Risk: After full review of this patients clinical status, I feel that they are at moderate risk for cardiac decompensation at this time.  I will see if her PCP can check CMET and TSH for [place] when she goes for INR check on 4/6. Provide refills on HF medications. Will schedule her for follow up in 2 months. I do not think she is able to do a very meaningful telephone visit.  Today, I have spent 9 minutes with the patient with telehealth technology discussing her medications and heart failure. She told me she would have her  daughter call back to discuss further, but I have not heard from them.    Signed, Georgiana Shore, NP  09/12/2018 10:08 AM  [facility] [address] [facility] and [place] [address] [phone] (office) [phone] (fax)

## 2018-09-12 NOTE — Patient Instructions (Signed)
Additional lab work will need to be done at your primary care office. They are aware and will fax information to [place].  REFILLED all cardiac medications.

## 2018-09-16 ENCOUNTER — Ambulatory Visit: Payer: Medicare Other | Admitting: Internal Medicine

## 2018-09-16 ENCOUNTER — Telehealth: Payer: Self-pay | Admitting: Internal Medicine

## 2018-09-16 ENCOUNTER — Encounter (HOSPITAL_COMMUNITY): Payer: Medicare Other

## 2018-09-16 ENCOUNTER — Encounter: Payer: Self-pay | Admitting: Internal Medicine

## 2018-09-16 NOTE — Telephone Encounter (Signed)
Call to pt message left. Was to have had repeat Protime drawn today after adjusting Coumadin dose. Left message for her to call us. Needs follow up. Was sub therapeutic at last visit. She can stay in her car. We will go to car and draw protime.

## 2018-09-20 ENCOUNTER — Other Ambulatory Visit: Payer: Self-pay | Admitting: Cardiology

## 2018-09-20 ENCOUNTER — Telehealth: Payer: Self-pay | Admitting: *Deleted

## 2018-09-20 ENCOUNTER — Telehealth: Payer: Self-pay | Admitting: Cardiology

## 2018-09-20 DIAGNOSIS — E039 Hypothyroidism, unspecified: Secondary | ICD-10-CM

## 2018-09-20 MED ORDER — LEVOTHYROXINE SODIUM 75 MCG PO TABS: 75.0000 ug | ORAL_TABLET | Freq: Every day | ORAL | 1 refills | Status: DC

## 2018-09-20 NOTE — Telephone Encounter (Signed)
ERROR

## 2018-09-20 NOTE — Telephone Encounter (Signed)
°*  STAT* If patient is at the pharmacy, call can be transferred to refill team.   1. Which medications need to be refilled? (please list name of each medication and dose if known) LEVOTHYROXIN 75MCG   2. Which pharmacy/location (including street and city if local pharmacy) is medication to be sent to   [facility]  3. Do they need a 30 day or 90 day supply? 90 day

## 2018-09-20 NOTE — Telephone Encounter (Signed)
Spoke with pharmacy about Rx request. This pt has never been here and we will not be refilling her prescriptions. Please cancel Rx sent in by accident and send request to her PCP. Pharmacist acknowledged request and will cancel and send to PCP.

## 2018-09-30 ENCOUNTER — Other Ambulatory Visit: Payer: Self-pay

## 2018-09-30 ENCOUNTER — Other Ambulatory Visit (INDEPENDENT_AMBULATORY_CARE_PROVIDER_SITE_OTHER): Payer: Medicare Other | Admitting: Internal Medicine

## 2018-09-30 DIAGNOSIS — I428 Other cardiomyopathies: Secondary | ICD-10-CM | POA: Diagnosis not present

## 2018-09-30 DIAGNOSIS — Z7901 Long term (current) use of anticoagulants: Secondary | ICD-10-CM | POA: Diagnosis not present

## 2018-09-30 LAB — PROTIME-INR
INR: 3 — ABNORMAL HIGH
Prothrombin Time: 29.1 s — ABNORMAL HIGH (ref 9.0–11.5)

## 2018-09-30 NOTE — Addendum Note (Signed)
Addended by: Elby Showers on: 09/30/2018 12:05 PM   Modules accepted: Level of Service

## 2018-09-30 NOTE — Progress Notes (Signed)
   Subjective:    Patient ID: Casey [name], female    DOB: 1934-09-18, 84 y.o.   MRN: [id number]  HPI Patient seen in her car today for follow-up on pro time INR.  She is on chronic anticoagulation for nonischemic cardiomyopathy.  Pro time was drawn.  She reports no issues with bleeding.    Review of Systems no new complaints     Objective:   Physical Exam  Not examined.  Venipuncture obtained by CMA.  MD present to speak with patient.      Assessment & Plan:  Chronic anticoagulation  Nonischemic cardiomyopathy  Plan: Pro time INR is 3.  Continue Coumadin 2 mg daily and follow-up in 4 weeks

## 2018-09-30 NOTE — Patient Instructions (Signed)
Pro time INR drawn and pending with further instructions to follow.  Addendum: Continue Coumadin 10 mg daily and follow-up in 4 weeks.  INR is 3.

## 2018-10-01 ENCOUNTER — Ambulatory Visit (INDEPENDENT_AMBULATORY_CARE_PROVIDER_SITE_OTHER): Payer: Medicare Other | Admitting: *Deleted

## 2018-10-01 DIAGNOSIS — I5022 Chronic systolic (congestive) heart failure: Secondary | ICD-10-CM | POA: Diagnosis not present

## 2018-10-01 DIAGNOSIS — I428 Other cardiomyopathies: Secondary | ICD-10-CM

## 2018-10-01 LAB — CUP PACEART REMOTE DEVICE CHECK
Battery Remaining Longevity: 16 mo
Battery Voltage: 2.9 V
Brady Statistic AP VP Percent: 2.35 %
Brady Statistic AP VS Percent: 0.02 %
Brady Statistic AS VP Percent: 97.54 %
Brady Statistic AS VS Percent: 0.09 %
Brady Statistic RA Percent Paced: 2.36 %
Brady Statistic RV Percent Paced: 99.54 %
Date Time Interrogation Session: 20200421134544
HighPow Impedance: 43 Ohm
HighPow Impedance: 56 Ohm
Implantable Lead Implant Date: 20050525
Implantable Lead Implant Date: 20050525
Implantable Lead Implant Date: 20071119
Implantable Lead Location: 753858
Implantable Lead Location: 753859
Implantable Lead Location: 753860
Implantable Lead Model: 4193
Implantable Lead Model: 5076
Implantable Lead Model: 6947
Implantable Pulse Generator Implant Date: 20141105
Lead Channel Impedance Value: 304 Ohm
Lead Channel Impedance Value: 399 Ohm
Lead Channel Impedance Value: 4047 Ohm
Lead Channel Impedance Value: 4047 Ohm
Lead Channel Impedance Value: 475 Ohm
Lead Channel Impedance Value: 589 Ohm
Lead Channel Pacing Threshold Amplitude: 0.5 V
Lead Channel Pacing Threshold Amplitude: 1.25 V
Lead Channel Pacing Threshold Amplitude: 2.5 V
Lead Channel Pacing Threshold Pulse Width: 0.4 ms
Lead Channel Pacing Threshold Pulse Width: 0.4 ms
Lead Channel Pacing Threshold Pulse Width: 0.6 ms
Lead Channel Sensing Intrinsic Amplitude: 1.75 mV
Lead Channel Sensing Intrinsic Amplitude: 1.75 mV
Lead Channel Sensing Intrinsic Amplitude: 2.625 mV
Lead Channel Sensing Intrinsic Amplitude: 2.625 mV
Lead Channel Setting Pacing Amplitude: 2 V
Lead Channel Setting Pacing Amplitude: 2 V
Lead Channel Setting Pacing Amplitude: 3.5 V
Lead Channel Setting Pacing Pulse Width: 0.4 ms
Lead Channel Setting Pacing Pulse Width: 0.6 ms
Lead Channel Setting Sensing Sensitivity: 0.3 mV

## 2018-10-07 ENCOUNTER — Other Ambulatory Visit: Payer: Self-pay | Admitting: Internal Medicine

## 2018-10-08 NOTE — Progress Notes (Signed)
Remote ICD transmission.   

## 2018-10-14 ENCOUNTER — Encounter: Payer: Self-pay | Admitting: Internal Medicine

## 2018-10-14 ENCOUNTER — Ambulatory Visit (INDEPENDENT_AMBULATORY_CARE_PROVIDER_SITE_OTHER): Payer: Medicare Other | Admitting: Internal Medicine

## 2018-10-14 DIAGNOSIS — I428 Other cardiomyopathies: Secondary | ICD-10-CM

## 2018-10-14 DIAGNOSIS — Z7901 Long term (current) use of anticoagulants: Secondary | ICD-10-CM

## 2018-10-14 DIAGNOSIS — R7302 Impaired glucose tolerance (oral): Secondary | ICD-10-CM

## 2018-10-14 DIAGNOSIS — Q210 Ventricular septal defect: Secondary | ICD-10-CM

## 2018-10-14 DIAGNOSIS — H903 Sensorineural hearing loss, bilateral: Secondary | ICD-10-CM | POA: Diagnosis not present

## 2018-10-14 DIAGNOSIS — I480 Paroxysmal atrial fibrillation: Secondary | ICD-10-CM | POA: Diagnosis not present

## 2018-10-14 DIAGNOSIS — E039 Hypothyroidism, unspecified: Secondary | ICD-10-CM | POA: Diagnosis not present

## 2018-10-14 DIAGNOSIS — I10 Essential (primary) hypertension: Secondary | ICD-10-CM

## 2018-10-14 DIAGNOSIS — Z0000 Encounter for general adult medical examination without abnormal findings: Secondary | ICD-10-CM | POA: Diagnosis not present

## 2018-10-14 NOTE — Patient Instructions (Signed)
Keep appointment for pro time INR mid May.  Schedule physical examination for July 2020.  Continue same medications.

## 2018-10-14 NOTE — Progress Notes (Signed)
   Subjective:    Patient ID: Casey [name], female    DOB: [birth year], 84 y.o.   MRN: [id number]  HPI 84 year old Female for Annual Medicare wellness visit today.  Interactive audio and video telecommunications were attempted but failed due to patient not having video capability.  We continued with audio alone.  Patient agrees to visit COVID-19 outbreak.  Patient is hard of hearing.  Television is playing well in the background.  Says she is doing just fine and does not need anything.  Her neighbor gets her groceries and takes her trash out.  She does her own cooking.  She has no trouble bathing or using the toilet.  She has a shower seat in her shower.  There are rails on the shower seat that she can use. She does not drive.  She is able to get from bed to chair.  She does not have to go up a flight of stairs.  Her husband is 1 level.  She has not fallen in the past year.  Her husband is in skilled care at [place] on [facility].  He has severe COPD.  Patient is not sexually active.  She does have to ask people to repeat themselves.  She does complain of some ringing in her ears.  He does not feel she has an issue with her memory.  Occasionally misplaces items.  He says she does not have a smoke alarm in her home.  Has 1 throw rug at the front door but says it does not move.  She remains on chronic anticoagulation per Cardiology.  She has history of nonischemic cardiomyopathy as well as paroxysmal atrial fibrillation.  That is why she is on chronic anticoagulation therapy.  Is now seen at [facility] since Dr. Wynonia Lawman retired.  She is now also being seen here for chronic anticoagulation therapy.  She has chronic kidney disease.  This is stable.  She denies significant shortness of breath.  Able to do light cleaning without difficulty.  Not on ACE or arm due to renal dysfunction.  Denies feeling depressed or hopeless.  Does not cry over several problems.  Able  to keep up with appointments.  Do not feel that she is excessively forgetful.  Memory testing not performed today because it was difficult to get her to hear over the phone.  She seems alert and oriented.  Her daughter lives in [place] but comes down to check on her on weekends.  Her son lives here and is supportive.  However, her neighbor has been very good to look in on her and get her food and say that her trash is empty.      Review of Systems no new complaints -does not want hearing aids     Objective:   Physical Exam Not examined today.  Seen for Annual Medicare wellness visit via audio telecommunications.       Assessment & Plan:  Annual Medicare wellness visit  Plan: Her next pro time INR is scheduled for May 18.  We will set up health maintenance exam with fasting labs for summer 2020.

## 2018-10-28 ENCOUNTER — Other Ambulatory Visit: Payer: Self-pay

## 2018-10-28 ENCOUNTER — Encounter: Payer: Self-pay | Admitting: Internal Medicine

## 2018-10-28 ENCOUNTER — Ambulatory Visit (INDEPENDENT_AMBULATORY_CARE_PROVIDER_SITE_OTHER): Payer: Medicare Other | Admitting: Internal Medicine

## 2018-10-28 VITALS — BP 120/88 | HR 77 | Ht 62.0 in | Wt 170.0 lb

## 2018-10-28 DIAGNOSIS — I428 Other cardiomyopathies: Secondary | ICD-10-CM | POA: Diagnosis not present

## 2018-10-28 DIAGNOSIS — I10 Essential (primary) hypertension: Secondary | ICD-10-CM | POA: Diagnosis not present

## 2018-10-28 DIAGNOSIS — Z7901 Long term (current) use of anticoagulants: Secondary | ICD-10-CM | POA: Diagnosis not present

## 2018-10-28 DIAGNOSIS — R791 Abnormal coagulation profile: Secondary | ICD-10-CM

## 2018-10-28 LAB — PROTIME-INR
INR: 3.7 — ABNORMAL HIGH
Prothrombin Time: 35.1 s — ABNORMAL HIGH (ref 9.0–11.5)

## 2018-10-28 MED ORDER — WARFARIN SODIUM 2 MG PO TABS: 2.0000 mg | ORAL_TABLET | Freq: Every day | ORAL | 0 refills | Status: DC

## 2018-10-28 NOTE — Progress Notes (Signed)
   Subjective:    Patient ID: Casey [name], female    DOB: 05-05-1935, 84 y.o.   MRN: [id number]  HPI 84 year old Female seen today for chronic anticoagulation follow-up.  Pro time INR drawn.  Seen in person today in the office.  Currently on Coumadin for nonischemic cardiomyopathy.  She resides alone.  No falls.  No issues with bleeding.  She is hard of hearing.    History of hypertension and blood pressure is stable.   Seen in office in no acute distress.  Pro time INR obtained by phlebotomist.  Results sent stat to Lab.   Review of Systems see above     Objective:   Physical Exam  Neck supple. Chest clear. Cor:RRR Ext without edema.  She is alert and oriented.      Assessment & Plan:  Chronic anticoagulation.  Pro time drawn and pending  Addendum: Pro time INR is 3.7.  Ideally it should be between 2 and 3. Denies dietary indiscretion.  Patient advised to decrease Coumadin  To 2 mg daily and follow-up with another pro time in 2 weeks.

## 2018-11-08 ENCOUNTER — Other Ambulatory Visit (HOSPITAL_COMMUNITY): Payer: Self-pay | Admitting: *Deleted

## 2018-11-08 DIAGNOSIS — I5022 Chronic systolic (congestive) heart failure: Secondary | ICD-10-CM

## 2018-11-08 MED ORDER — TORSEMIDE 20 MG PO TABS: 60.0000 mg | ORAL_TABLET | Freq: Every day | ORAL | 2 refills | Status: DC

## 2018-11-09 NOTE — Patient Instructions (Signed)
Pro time INR is elevated at 3.7.  Ideally should be between 2 and 3.

## 2018-11-11 ENCOUNTER — Encounter: Payer: Self-pay | Admitting: Internal Medicine

## 2018-11-11 ENCOUNTER — Ambulatory Visit (INDEPENDENT_AMBULATORY_CARE_PROVIDER_SITE_OTHER): Payer: Medicare Other | Admitting: Internal Medicine

## 2018-11-11 DIAGNOSIS — Z7901 Long term (current) use of anticoagulants: Secondary | ICD-10-CM | POA: Diagnosis not present

## 2018-11-11 DIAGNOSIS — I10 Essential (primary) hypertension: Secondary | ICD-10-CM | POA: Diagnosis not present

## 2018-11-11 DIAGNOSIS — I428 Other cardiomyopathies: Secondary | ICD-10-CM

## 2018-11-11 LAB — PROTIME-INR
INR: 1.6 — ABNORMAL HIGH
Prothrombin Time: 15.9 s — ABNORMAL HIGH (ref 9.0–11.5)

## 2018-11-11 MED ORDER — WARFARIN SODIUM 2.5 MG PO TABS: 2.5000 mg | ORAL_TABLET | Freq: Every day | ORAL | 0 refills | Status: DC

## 2018-11-11 NOTE — Patient Instructions (Addendum)
Protime INR collected today. Lab sent blood stat and I will contact patient with results and further instructions.

## 2018-11-11 NOTE — Addendum Note (Signed)
Addended by: Elby Showers on: 11/11/2018 12:59 PM   Modules accepted: Orders

## 2018-11-11 NOTE — Progress Notes (Signed)
   Subjective:    Patient ID: Casey [name], female    DOB: 1934/06/19, 84 y.o.   MRN: [id number]  HPI 84 year old Female seen in follow up for chronic anticoagulation therapy.  At last visit on May 18 pro time INR was high at 3.7.  Patient was advised to decrease Coumadin to 2 mg daily.  She is now here for follow-up.  Reports no issues with bleeding.    Review of Systems see above     Objective:   Physical Exam Not examined.  Lab collected.  Results to follow.       Assessment & Plan:  Chronic anticoagulation  Plan: Pro time INR results to follow with further instructions to be given to patient once results are back

## 2018-11-13 ENCOUNTER — Encounter (HOSPITAL_COMMUNITY): Payer: Medicare Other

## 2018-11-17 ENCOUNTER — Other Ambulatory Visit: Payer: Self-pay | Admitting: Internal Medicine

## 2018-11-17 DIAGNOSIS — I700 Atherosclerosis of aorta: Secondary | ICD-10-CM

## 2018-11-18 ENCOUNTER — Other Ambulatory Visit: Payer: Medicare Other | Admitting: Internal Medicine

## 2018-11-18 ENCOUNTER — Other Ambulatory Visit: Payer: Self-pay

## 2018-11-18 DIAGNOSIS — Z7901 Long term (current) use of anticoagulants: Secondary | ICD-10-CM

## 2018-11-18 DIAGNOSIS — I5022 Chronic systolic (congestive) heart failure: Secondary | ICD-10-CM

## 2018-11-18 LAB — PROTIME-INR
INR: 1.8 — ABNORMAL HIGH
Prothrombin Time: 18.2 s — ABNORMAL HIGH (ref 9.0–11.5)

## 2018-11-19 LAB — COMPLETE METABOLIC PANEL WITH GFR
AG Ratio: 1.7 (calc) (ref 1.0–2.5)
ALT: 8 U/L (ref 6–29)
AST: 15 U/L (ref 10–35)
Albumin: 4.2 g/dL (ref 3.6–5.1)
Alkaline phosphatase (APISO): 132 U/L (ref 37–153)
BUN/Creatinine Ratio: 16 (calc) (ref 6–22)
BUN: 48 mg/dL — ABNORMAL HIGH (ref 7–25)
CO2: 22 mmol/L (ref 20–32)
Calcium: 10 mg/dL (ref 8.6–10.4)
Chloride: 109 mmol/L (ref 98–110)
Creat: 3.01 mg/dL — ABNORMAL HIGH (ref 0.60–0.88)
GFR, Est African American: 16 mL/min/{1.73_m2} — ABNORMAL LOW (ref >=60)
GFR, Est Non African American: 14 mL/min/{1.73_m2} — ABNORMAL LOW (ref >=60)
Globulin: 2.5 g/dL (calc) (ref 1.9–3.7)
Glucose, Bld: 119 mg/dL — ABNORMAL HIGH (ref 65–99)
Potassium: 4.4 mmol/L (ref 3.5–5.3)
Sodium: 143 mmol/L (ref 135–146)
Total Bilirubin: 1.2 mg/dL (ref 0.2–1.2)
Total Protein: 6.7 g/dL (ref 6.1–8.1)

## 2018-11-19 LAB — TSH: TSH: 0.87 mIU/L (ref 0.40–4.50)

## 2018-11-25 ENCOUNTER — Ambulatory Visit (HOSPITAL_COMMUNITY)
Admission: RE | Admit: 2018-11-25 | Discharge: 2018-11-25 | Disposition: A | Discharge status: Home / Self Care | Payer: Medicare Other | Source: Ambulatory Visit | Attending: Cardiology | Admitting: Cardiology

## 2018-11-25 ENCOUNTER — Encounter (HOSPITAL_COMMUNITY): Payer: Self-pay

## 2018-11-25 ENCOUNTER — Other Ambulatory Visit: Payer: Self-pay

## 2018-11-25 VITALS — BP 115/75 | HR 85 | Wt 171.6 lb

## 2018-11-25 DIAGNOSIS — N184 Chronic kidney disease, stage 4 (severe): Secondary | ICD-10-CM | POA: Diagnosis not present

## 2018-11-25 DIAGNOSIS — I5022 Chronic systolic (congestive) heart failure: Secondary | ICD-10-CM

## 2018-11-25 DIAGNOSIS — I428 Other cardiomyopathies: Secondary | ICD-10-CM

## 2018-11-25 NOTE — Patient Instructions (Signed)
Your physician has requested that you have an echocardiogram. Echocardiography is a painless test that uses sound waves to create images of your heart. It provides your doctor with information about the size and shape of your heart and how well your heart's chambers and valves are working. This procedure takes approximately one hour. There are no restrictions for this procedure. This will be a part of your follow up appointment.   Please follow up with [facility] in 3 - 4 months with an Echocardiogram.   At the [facility], you and your health needs are our priority. As part of our continuing mission to provide you with exceptional heart care, we have created designated Provider Care Teams. These Care Teams include your primary Cardiologist (physician) and Advanced Practice Providers (APPs- Physician Assistants and Nurse Practitioners) who all work together to provide you with the care you need, when you need it.   You may see any of the following providers on your designated Care Team at your next follow up: Marland Kitchen Dr Glori Bickers . Dr Loralie Champagne . Darrick Grinder, NP

## 2018-11-25 NOTE — Progress Notes (Signed)
Advanced Heart Failure Clinic Note   Referring Physician: PCP: Elby Showers, MD PCP-Cardiologist: Ezzard Standing, MD  HF Cardiology: Dr. Aundra Dubin Nephrology: Dr Justin Mend  HPI: Casey [name] is a 84 y.o. female with long history of nonischemic cardiomyopathy, now CKD stage 3-4. She had cath in 5/05 with normal coronaries. She has MDT CRT-D device. Not on ACEI/ARB/ARNI due to renal dysfunction (tried all in the past). On warfarin with history of paroxysmal atrial fibrillation. Echo in 5/17 with EF 20%, moderate MR, moderate TR, small VSD.   Admitted 07/06/17 with increase SOB and decreased urine output. Echo with EF 10-15%, moderate-severe MR.  Diuresed with IV lasix and metolazone and lost 7 lbs with improvement in symptoms. Transitioned from last to torsemide. Meds optimized but limited due to CKD IV.   TEE in 5/19 showed moderate central MR, not severe.   Today she returns for HF follow up. Overall feeling fine. Denies SOB/PND/Orthopnea. No chest pain. No bleeding issues.  Appetite ok. No fever or chills. Weight at home 171  pounds. Taking all medications. Only taking hydralazine once a day.   Labs (3/19): K 3.9, creatinine 2.6, hgb 15.3 Labs (4/19): LFTs normal Labs (5/19): K 4.9, creatinine 2.75, TSH normal, hgb 14.8 11/18/2018: Creatinine 3, K 4.4   Review of systems complete and found to be negative unless listed in HPI.    Past Medical History 1. Chronic systolic CHF: Long-standing nonischemic cardiomyopathy, 5/05 cath without significant CAD. Echo in 5/17 with EF 20%.  - Echo 1/19 with EF 10-15%, prominent dyssynchrony, moderate-severe MR, PASP 91 (but ?if this represents VSD flow rather than TR).  - MDT CRT-D device.  - TEE (5/19): EF 15-20%, mild LV dilation, small peri-membranous VSD, moderate central MR, RV mildly dilated with normal systolic function.  2. Atrial fibrillation: Paroxysmal. She is on amiodarone and warfarin.  3. CKD: Stage 4. Follows with nephrology  (Dr. Justin Mend).  4. H/o VSD:  Small/restrictive.  5. Mitral regurgitation: Moderate-severe central MR on 1/19 echo, likely functional.   - Moderate central MR on 5/19 TEE.  6. Gout 7. Hypothyroidism 8. PUD  Current Outpatient Medications  Medication Sig Dispense Refill  . allopurinol (ZYLOPRIM) 100 MG tablet Take 1 tablet (100 mg total) by mouth daily. 30 tablet 6  . dorzolamide (TRUSOPT) 2 % ophthalmic solution Place 1 drop into both eyes 3 (three) times daily. 10 mL 0  . hydrALAZINE (APRESOLINE) 25 MG tablet Take 1 tablet (25 mg total) by mouth every 12 (twelve) hours. (Patient taking differently: Take 50 mg by mouth daily. ) 180 tablet 1  . isosorbide mononitrate (IMDUR) 30 MG 24 hr tablet Take 1 tablet by mouth daily 90 tablet 0  . levothyroxine (SYNTHROID, LEVOTHROID) 75 MCG tablet Take 1 tablet (75 mcg total) by mouth daily before breakfast. 90 tablet 1  . metoprolol succinate (TOPROL-XL) 50 MG 24 hr tablet Take 1 tablet (50 mg total) by mouth daily. Take with or immediately following a meal. 90 tablet 1  . Multiple Vitamins-Calcium (ONE-A-DAY WOMENS FORMULA) TABS Take 1 tablet by mouth daily.      . potassium chloride SA (K-DUR,KLOR-CON) 20 MEQ tablet Take 1 tablet (20 mEq total) by mouth daily. 90 tablet 1  . torsemide (DEMADEX) 20 MG tablet Take 3 tablets (60 mg total) by mouth daily. 90 tablet 2  . warfarin (COUMADIN) 2.5 MG tablet Take 1 tablet (2.5 mg total) by mouth daily. (Patient taking differently: Take 2.5 mg by mouth daily. As directed by  the coumadin clinic) 30 tablet 0   No current facility-administered medications for this encounter.     Allergies  Allergen Reactions  . Codeine Rash      Social History   Socioeconomic History  . Marital status: Married    Spouse name: Not on file  . Number of children: Not on file  . Years of education: Not on file  . Highest education level: Not on file  Occupational History  . Not on file  Social Needs  . Financial resource  strain: Not on file  . Food insecurity    Worry: Not on file    Inability: Not on file  . Transportation needs    Medical: Not on file    Non-medical: Not on file  Tobacco Use  . Smoking status: Never Smoker  . Smokeless tobacco: Never Used  Substance and Sexual Activity  . Alcohol use: No  . Drug use: No  . Sexual activity: Not on file  Lifestyle  . Physical activity    Days per week: Not on file    Minutes per session: Not on file  . Stress: Not on file  Relationships  . Social Herbalist on phone: Not on file    Gets together: Not on file    Attends religious service: Not on file    Active member of club or organization: Not on file    Attends meetings of clubs or organizations: Not on file    Relationship status: Not on file  . Intimate partner violence    Fear of current or ex partner: Not on file    Emotionally abused: Not on file    Physically abused: Not on file    Forced sexual activity: Not on file  Other Topics Concern  . Not on file  Social History Narrative  . Not on file      Family History  Problem Relation Age of Onset  . Heart failure Mother   . Cancer Father    Vitals:   11/25/18 1021  BP: 115/75  Pulse: 85  SpO2: 94%  Weight: 77.8 kg (171 lb 9.6 oz)     Wt Readings from Last 3 Encounters:  11/25/18 77.8 kg (171 lb 9.6 oz)  10/28/18 77.1 kg (170 lb)  07/29/18 77.1 kg (170 lb)    PHYSICAL EXAM: General:  Well appearing. No resp difficulty HEENT: normal Neck: supple. no JVD. Carotids 2+ bilat; no bruits. No lymphadenopathy or thryomegaly appreciated. Cor: PMI nondisplaced. Regular rate & rhythm. No rubs, gallops or murmurs. Lungs: clear Abdomen: soft, nontender, nondistended. No hepatosplenomegaly. No bruits or masses. Good bowel sounds. Extremities: no cyanosis, clubbing, rash, edema Neuro: alert & orientedx3, cranial nerves grossly intact. moves all 4 extremities w/o difficulty. Affect pleasant  ASSESSMENT & PLAN:  1.  Chronic systolic CHF: Long-standing NICM, 5/05 cath without significant CAD. Echo in 5/17 with EF 20%, echo in 1/19 with EF 10-15%, prominent dyssynchrony, moderate-severe MR, PASP 91 (but ?if this represents VSD flow rather than TR). Complicated by CKD stage 4. Has MDT CRT-D device. In 1/19, she underwent V-V optimization per EP.  TEE in 5/19 showed moderate MR, EF 15-20%.   -NYHA II. Volume status stable. Continue torsemide 60 mg daily. Continue  Toprol XL to 50 mg daily.   - No ACEI/ARB/ARNI or spiro with elevated creatinine. .  -Not sure why she has been taking hydralazine 50 mg once a day. Change hydralazine to 25 mg twice a day.  Continue Imdur 30 daily.  2. Atrial fibrillation: Paroxysmal. Regular pulse.  - Continue amiodarone. Check LFTs/TSH.  She will need regular eye exams.  - Continue warfarin.  3. CKD: Stage 4.     Most recent BMEt creatinine was 3. She has follow up with Dr Justin Mend.  45. H/o VSD: Small peri-membranous VSD on 5/19 TEE.   5. Mitral regurgitation: TEE 5/19 with moderate functional MR.  Not candidate for Mitraclip.    Follow up 3-4 months with Dr Aundra Dubin. Plan to repeat ECHO at that time.  Darrick Grinder, NP 11/25/18

## 2018-11-26 ENCOUNTER — Other Ambulatory Visit: Payer: Self-pay | Admitting: Adult Health

## 2018-11-26 ENCOUNTER — Other Ambulatory Visit: Payer: Self-pay | Admitting: Internal Medicine

## 2018-11-26 DIAGNOSIS — Z1231 Encounter for screening mammogram for malignant neoplasm of breast: Secondary | ICD-10-CM

## 2018-11-26 DIAGNOSIS — H8112 Benign paroxysmal vertigo, left ear: Secondary | ICD-10-CM

## 2018-11-26 DIAGNOSIS — I5022 Chronic systolic (congestive) heart failure: Secondary | ICD-10-CM

## 2018-12-02 ENCOUNTER — Other Ambulatory Visit: Payer: Self-pay

## 2018-12-02 ENCOUNTER — Other Ambulatory Visit: Payer: Self-pay | Admitting: Internal Medicine

## 2018-12-02 ENCOUNTER — Other Ambulatory Visit: Payer: Medicare Other | Admitting: Internal Medicine

## 2018-12-02 ENCOUNTER — Encounter: Payer: Self-pay | Admitting: Internal Medicine

## 2018-12-02 ENCOUNTER — Ambulatory Visit (INDEPENDENT_AMBULATORY_CARE_PROVIDER_SITE_OTHER): Payer: Medicare Other | Admitting: Internal Medicine

## 2018-12-02 VITALS — BP 100/80

## 2018-12-02 DIAGNOSIS — E7849 Other hyperlipidemia: Secondary | ICD-10-CM

## 2018-12-02 DIAGNOSIS — R791 Abnormal coagulation profile: Secondary | ICD-10-CM | POA: Diagnosis not present

## 2018-12-02 DIAGNOSIS — Z7901 Long term (current) use of anticoagulants: Secondary | ICD-10-CM | POA: Diagnosis not present

## 2018-12-02 NOTE — Progress Notes (Signed)
Lab only Protime and fasting lipid panel

## 2018-12-02 NOTE — Patient Instructions (Signed)
Protime and lipid panel drawn and pending

## 2018-12-02 NOTE — Addendum Note (Signed)
Addended by: Virl Cagey on: 12/02/2018 12:34 PM   Modules accepted: Orders

## 2018-12-02 NOTE — Progress Notes (Signed)
Protime INR drawn. BP 100/80. Have added lipid panel also as she is fasting and that should conclude the labs she needs for upcoming CPE. Have had issues getting her protime at therapeutic level. She says she is taking the tablets and doses correctly.

## 2018-12-03 LAB — PROTIME-INR
INR: 2.6 — ABNORMAL HIGH
Prothrombin Time: 25 s — ABNORMAL HIGH (ref 9.0–11.5)

## 2018-12-04 LAB — LIPID PANEL
Cholesterol: 320 mg/dL — ABNORMAL HIGH
HDL: 50 mg/dL
LDL Cholesterol (Calc): 233 mg/dL — ABNORMAL HIGH
Non-HDL Cholesterol (Calc): 270 mg/dL — ABNORMAL HIGH
Total CHOL/HDL Ratio: 6.4 (calc) — ABNORMAL HIGH
Triglycerides: 195 mg/dL — ABNORMAL HIGH

## 2018-12-04 LAB — EXTRA LAV TOP TUBE

## 2018-12-04 LAB — HOUSE ACCOUNT TRACKING

## 2018-12-06 ENCOUNTER — Other Ambulatory Visit: Payer: Self-pay | Admitting: Internal Medicine

## 2018-12-16 ENCOUNTER — Encounter: Payer: Self-pay | Admitting: Internal Medicine

## 2018-12-16 ENCOUNTER — Other Ambulatory Visit: Payer: Self-pay

## 2018-12-16 ENCOUNTER — Ambulatory Visit (INDEPENDENT_AMBULATORY_CARE_PROVIDER_SITE_OTHER): Payer: Medicare Other | Admitting: Internal Medicine

## 2018-12-16 ENCOUNTER — Encounter: Payer: Medicare Other | Admitting: Internal Medicine

## 2018-12-16 VITALS — BP 120/88 | HR 80 | Ht 62.0 in | Wt 170.0 lb

## 2018-12-16 DIAGNOSIS — R7302 Impaired glucose tolerance (oral): Secondary | ICD-10-CM | POA: Diagnosis not present

## 2018-12-16 DIAGNOSIS — I10 Essential (primary) hypertension: Secondary | ICD-10-CM

## 2018-12-16 DIAGNOSIS — I428 Other cardiomyopathies: Secondary | ICD-10-CM

## 2018-12-16 DIAGNOSIS — Q210 Ventricular septal defect: Secondary | ICD-10-CM

## 2018-12-16 DIAGNOSIS — R8290 Unspecified abnormal findings in urine: Secondary | ICD-10-CM

## 2018-12-16 DIAGNOSIS — N184 Chronic kidney disease, stage 4 (severe): Secondary | ICD-10-CM

## 2018-12-16 DIAGNOSIS — Z8739 Personal history of other diseases of the musculoskeletal system and connective tissue: Secondary | ICD-10-CM

## 2018-12-16 DIAGNOSIS — E782 Mixed hyperlipidemia: Secondary | ICD-10-CM

## 2018-12-16 DIAGNOSIS — R7309 Other abnormal glucose: Secondary | ICD-10-CM | POA: Diagnosis not present

## 2018-12-16 DIAGNOSIS — Z0000 Encounter for general adult medical examination without abnormal findings: Secondary | ICD-10-CM

## 2018-12-16 DIAGNOSIS — E039 Hypothyroidism, unspecified: Secondary | ICD-10-CM

## 2018-12-16 DIAGNOSIS — E7849 Other hyperlipidemia: Secondary | ICD-10-CM | POA: Diagnosis not present

## 2018-12-16 DIAGNOSIS — H903 Sensorineural hearing loss, bilateral: Secondary | ICD-10-CM | POA: Diagnosis not present

## 2018-12-16 DIAGNOSIS — I480 Paroxysmal atrial fibrillation: Secondary | ICD-10-CM

## 2018-12-16 DIAGNOSIS — Z7901 Long term (current) use of anticoagulants: Secondary | ICD-10-CM

## 2018-12-16 LAB — PROTIME-INR
INR: 2.4 — ABNORMAL HIGH
Prothrombin Time: 23.5 s — ABNORMAL HIGH (ref 9.0–11.5)

## 2018-12-16 LAB — POCT URINALYSIS DIPSTICK
Bilirubin, UA: NEGATIVE
Glucose, UA: NEGATIVE
Ketones, UA: NEGATIVE
Nitrite, UA: POSITIVE
Protein, UA: POSITIVE — AB
Spec Grav, UA: 1.015 (ref 1.010–1.025)
Urobilinogen, UA: 0.2 E.U./dL
pH, UA: 5 (ref 5.0–8.0)

## 2018-12-17 ENCOUNTER — Other Ambulatory Visit: Payer: Self-pay

## 2018-12-17 LAB — CBC WITH DIFFERENTIAL/PLATELET
Absolute Monocytes: 446 cells/uL (ref 200–950)
Basophils Absolute: 41 cells/uL (ref 0–200)
Basophils Relative: 0.9 %
Eosinophils Absolute: 90 cells/uL (ref 15–500)
Eosinophils Relative: 2 %
HCT: 44.4 % (ref 35.0–45.0)
Hemoglobin: 15.2 g/dL (ref 11.7–15.5)
Lymphs Abs: 932 cells/uL (ref 850–3900)
MCH: 33.5 pg — ABNORMAL HIGH (ref 27.0–33.0)
MCHC: 34.2 g/dL (ref 32.0–36.0)
MCV: 97.8 fL (ref 80.0–100.0)
MPV: 13.8 fL — ABNORMAL HIGH (ref 7.5–12.5)
Monocytes Relative: 9.9 %
Neutro Abs: 2993 cells/uL (ref 1500–7800)
Neutrophils Relative %: 66.5 %
Platelets: 114 10*3/uL — ABNORMAL LOW (ref 140–400)
RBC: 4.54 10*6/uL (ref 3.80–5.10)
RDW: 13.1 % (ref 11.0–15.0)
Total Lymphocyte: 20.7 %
WBC: 4.5 10*3/uL (ref 3.8–10.8)

## 2018-12-17 LAB — LIPID PANEL
Cholesterol: 289 mg/dL — ABNORMAL HIGH (ref <200)
HDL: 45 mg/dL — ABNORMAL LOW (ref >=50)
LDL Cholesterol (Calc): 204 mg/dL (calc) — ABNORMAL HIGH
Non-HDL Cholesterol (Calc): 244 mg/dL (calc) — ABNORMAL HIGH (ref <130)
Total CHOL/HDL Ratio: 6.4 (calc) — ABNORMAL HIGH (ref <5.0)
Triglycerides: 211 mg/dL — ABNORMAL HIGH (ref <150)

## 2018-12-17 LAB — URINE CULTURE
MICRO NUMBER:: 637015
SPECIMEN QUALITY:: ADEQUATE

## 2018-12-17 LAB — HEMOGLOBIN A1C
Hgb A1c MFr Bld: 6 % of total Hgb — ABNORMAL HIGH (ref <5.7)
Mean Plasma Glucose: 126 (calc)
eAG (mmol/L): 7 (calc)

## 2018-12-17 MED ORDER — ROSUVASTATIN CALCIUM 5 MG PO TABS: 5.0000 mg | ORAL_TABLET | Freq: Every day | ORAL | 1 refills | Status: DC

## 2018-12-31 ENCOUNTER — Ambulatory Visit (INDEPENDENT_AMBULATORY_CARE_PROVIDER_SITE_OTHER): Payer: Medicare Other | Admitting: *Deleted

## 2018-12-31 DIAGNOSIS — I428 Other cardiomyopathies: Secondary | ICD-10-CM | POA: Diagnosis not present

## 2018-12-31 LAB — CUP PACEART REMOTE DEVICE CHECK
Battery Remaining Longevity: 13 mo
Battery Voltage: 2.88 V
Brady Statistic AP VP Percent: 2.9 %
Brady Statistic AP VS Percent: 0.02 %
Brady Statistic AS VP Percent: 96.02 %
Brady Statistic AS VS Percent: 1.07 %
Brady Statistic RA Percent Paced: 2.9 %
Brady Statistic RV Percent Paced: 98.38 %
Date Time Interrogation Session: 20200721160652
HighPow Impedance: 44 Ohm
HighPow Impedance: 57 Ohm
Implantable Lead Implant Date: 20050525
Implantable Lead Implant Date: 20050525
Implantable Lead Implant Date: 20071119
Implantable Lead Location: 753858
Implantable Lead Location: 753859
Implantable Lead Location: 753860
Implantable Lead Model: 4193
Implantable Lead Model: 5076
Implantable Lead Model: 6947
Implantable Pulse Generator Implant Date: 20141105
Lead Channel Impedance Value: 304 Ohm
Lead Channel Impedance Value: 399 Ohm
Lead Channel Impedance Value: 4047 Ohm
Lead Channel Impedance Value: 4047 Ohm
Lead Channel Impedance Value: 475 Ohm
Lead Channel Impedance Value: 608 Ohm
Lead Channel Pacing Threshold Amplitude: 0.5 V
Lead Channel Pacing Threshold Amplitude: 1.125 V
Lead Channel Pacing Threshold Amplitude: 2.125 V
Lead Channel Pacing Threshold Pulse Width: 0.4 ms
Lead Channel Pacing Threshold Pulse Width: 0.4 ms
Lead Channel Pacing Threshold Pulse Width: 0.6 ms
Lead Channel Sensing Intrinsic Amplitude: 1.5 mV
Lead Channel Sensing Intrinsic Amplitude: 1.5 mV
Lead Channel Sensing Intrinsic Amplitude: 2.5 mV
Lead Channel Sensing Intrinsic Amplitude: 2.5 mV
Lead Channel Setting Pacing Amplitude: 1.75 V
Lead Channel Setting Pacing Amplitude: 2 V
Lead Channel Setting Pacing Amplitude: 3.25 V
Lead Channel Setting Pacing Pulse Width: 0.4 ms
Lead Channel Setting Pacing Pulse Width: 0.6 ms
Lead Channel Setting Sensing Sensitivity: 0.3 mV

## 2019-01-04 NOTE — Patient Instructions (Signed)
Has been started on Crestor 5 mg daily.  No change in medications otherwise.  Follow-up in early August with fasting lipid panel liver functions and pro time.

## 2019-01-04 NOTE — Progress Notes (Signed)
Subjective:    Patient ID: Casey [name], female    DOB: Aug 22, 1934, 84 y.o.   MRN: [id number]  HPI 84 year old Female for health maintenance exam and evaluation of medical issues.  She had annual Medicare wellness exam in May done is a virtual visit.  She is on chronic anticoagulation.  She is hard of hearing.  She had lab work done with total cholesterol 320, triglycerides 195 and LDL cholesterol 233.  Denies not fasting prior to labs being obtained.  I have started her on Crestor 5 mg daily.  She is to follow-up with pro time panel and liver functions in 4 weeks.    She resides alone.  Husband is 1 of the nursing home.  Her son lives here in town and brings her for appointments.  Her daughter lives in [place] and visits fairly regularly.  She has impaired glucose tolerance and hemoglobin A1c is 6%.  She had bimalleolar left ankle fracture after a fall at home that had to be surgically repaired by Dr. Marlou Sa and required skilled nursing for bit.  Previously followed by Dr. Wynonia Lawman.  Will now need to be followed by [facility].  She has a history of hypothyroidism chronic kidney disease, glaucoma, nonischemic cardiomyopathy with low ejection fraction, implantable defibrillator, hyperlipidemia, hypertension.  Past medical history: Hysterectomy with BSO in July 31, 1988.  She took estrogen replacement for a number of years.  History of peptic ulcer disease when she was 84 years old.  History of spinal stenosis treated conservatively by Dr. Carloyn Manner many years ago but was hospitalized due to severe pain at the time.  Right cataract extraction July 2009. Infected epidermoid cyst incised and drained December 2010.  Last colonoscopy on file 1999.  She receives annual flu vaccine.  Had tetanus immunization March 2011.  Social history: She is married.  Husband has severe COPD and resides at [place] home.  He is retired Art gallery manager.  1 son and 1 daughter.  Patient does not  smoke or consume alcohol.  Family history: Father died at age 2 with history of cancer of the esophagus.  Mother with history of diabetes mellitus, CVA, hypertension, MI and dementia.  One brother died of unknown causes.  Apparently he had history of heart issues.  One sister.  Not able to be on ACE or are due to chronic kidney disease.  She is on chronic anticoagulation for history of PAF.  Longstanding history of nonischemic cardiomyopathy.  Had catheterization 2005 with normal coronaries.  History of moderate to severe MR.  Was last hospitalized January 2019 for acute on chronic congestive heart failure.  History of small VSD.  History of biventricular defibrillator.       Review of Systems no new complaints.  Is very hard of hearing and I think he has some mild memory loss.     Objective:   Physical Exam Blood pressure 120/88.  Weight 170 pounds.  BMI 31.09.  Pulse 80 pulse oximetry 96%.  Skin warm and dry.  Nodes none.  TMs and pharynx are clear.  Neck is supple without JVD thyromegaly or carotid bruits.  Chest clear to auscultation.  Cardiac exam regular rate and rhythm today.  Breasts normal female without masses.  Abdomen no hepatosplenomegaly masses or tenderness.  Pelvic exam deferred.  No lower extremity edema.  Neuro mild memory loss but no gross focal deficits on brief neurological exam.       Assessment & Plan:  Chronic coagulation due  to history of PAF  Acute on chronic congestive heart failure  History of biventricular defibrillator followed at [facility] cardiology  Chronic kidney disease  Essential hypertension  Hypothyroidism  History of gout  Status post bimalleolar ankle fracture fall 2019  History of small VSD  Hearing loss  Hyperlipidemia  Plan: Continue allopurinol, metoprolol levothyroxine torsemide hydralazine.  Immunizations are up-to-date.  Will need flu vaccine in the fall.  Had Medicare annual wellness visit earlier via virtual format.   She will continue to get monthly pro time evaluations.  Is going to need follow-up on lipid panel liver functions after being started on Crestor in early August.

## 2019-01-09 ENCOUNTER — Ambulatory Visit
Admission: RE | Admit: 2019-01-09 | Discharge: 2019-01-09 | Disposition: A | Discharge status: Home / Self Care | Payer: Medicare Other | Source: Ambulatory Visit | Attending: Internal Medicine | Admitting: Internal Medicine

## 2019-01-09 ENCOUNTER — Other Ambulatory Visit: Payer: Self-pay

## 2019-01-09 DIAGNOSIS — Z1231 Encounter for screening mammogram for malignant neoplasm of breast: Secondary | ICD-10-CM | POA: Diagnosis not present

## 2019-01-13 ENCOUNTER — Other Ambulatory Visit: Payer: Self-pay

## 2019-01-13 ENCOUNTER — Other Ambulatory Visit: Payer: Self-pay | Admitting: Internal Medicine

## 2019-01-13 ENCOUNTER — Other Ambulatory Visit: Payer: Medicare Other | Admitting: Internal Medicine

## 2019-01-13 DIAGNOSIS — Z7901 Long term (current) use of anticoagulants: Secondary | ICD-10-CM

## 2019-01-13 DIAGNOSIS — E782 Mixed hyperlipidemia: Secondary | ICD-10-CM | POA: Diagnosis not present

## 2019-01-13 LAB — HEPATIC FUNCTION PANEL
AG Ratio: 1.6 (calc) (ref 1.0–2.5)
ALT: 9 U/L (ref 6–29)
AST: 17 U/L (ref 10–35)
Albumin: 4.3 g/dL (ref 3.6–5.1)
Alkaline phosphatase (APISO): 151 U/L (ref 37–153)
Bilirubin, Direct: 0.2 mg/dL (ref 0.0–0.2)
Globulin: 2.7 g/dL (calc) (ref 1.9–3.7)
Indirect Bilirubin: 1.1 mg/dL (calc) (ref 0.2–1.2)
Total Bilirubin: 1.3 mg/dL — ABNORMAL HIGH (ref 0.2–1.2)
Total Protein: 7 g/dL (ref 6.1–8.1)

## 2019-01-13 LAB — LIPID PANEL
Cholesterol: 196 mg/dL (ref <200)
HDL: 41 mg/dL — ABNORMAL LOW (ref >=50)
LDL Cholesterol (Calc): 123 mg/dL (calc) — ABNORMAL HIGH
Non-HDL Cholesterol (Calc): 155 mg/dL (calc) — ABNORMAL HIGH (ref <130)
Total CHOL/HDL Ratio: 4.8 (calc) (ref <5.0)
Triglycerides: 204 mg/dL — ABNORMAL HIGH (ref <150)

## 2019-01-13 LAB — PROTIME-INR
INR: 3.3 — ABNORMAL HIGH
Prothrombin Time: 32.1 s — ABNORMAL HIGH (ref 9.0–11.5)

## 2019-01-14 ENCOUNTER — Ambulatory Visit (INDEPENDENT_AMBULATORY_CARE_PROVIDER_SITE_OTHER): Payer: Medicare Other | Admitting: Internal Medicine

## 2019-01-14 VITALS — BP 120/80 | HR 68 | Temp 98.3°F | Wt 169.0 lb

## 2019-01-14 DIAGNOSIS — Z7901 Long term (current) use of anticoagulants: Secondary | ICD-10-CM

## 2019-01-14 MED ORDER — ROSUVASTATIN CALCIUM 10 MG PO TABS: 10.0000 mg | ORAL_TABLET | Freq: Every day | ORAL | 3 refills | Status: DC

## 2019-01-15 ENCOUNTER — Encounter: Payer: Self-pay | Admitting: Cardiology

## 2019-01-15 LAB — PROTIME-INR
INR: 3.5 — ABNORMAL HIGH
Prothrombin Time: 33.2 s — ABNORMAL HIGH (ref 9.0–11.5)

## 2019-01-15 NOTE — Progress Notes (Signed)
Remote ICD transmission.   

## 2019-01-23 ENCOUNTER — Other Ambulatory Visit: Payer: Self-pay

## 2019-01-23 ENCOUNTER — Encounter: Payer: Self-pay | Admitting: Internal Medicine

## 2019-01-23 ENCOUNTER — Telehealth: Payer: Self-pay | Admitting: Internal Medicine

## 2019-01-23 ENCOUNTER — Other Ambulatory Visit: Payer: Medicare Other | Admitting: Internal Medicine

## 2019-01-23 DIAGNOSIS — Z7901 Long term (current) use of anticoagulants: Secondary | ICD-10-CM | POA: Diagnosis not present

## 2019-01-23 LAB — PROTIME-INR
INR: 2.9 — ABNORMAL HIGH
Prothrombin Time: 28.3 s — ABNORMAL HIGH (ref 9.0–11.5)

## 2019-01-23 NOTE — Telephone Encounter (Signed)
Protime INR stable on Coumadin 2.5 mg daily. Continue this dose and follow up in 4 weeks.

## 2019-02-06 ENCOUNTER — Other Ambulatory Visit: Payer: Self-pay | Admitting: Internal Medicine

## 2019-02-06 ENCOUNTER — Other Ambulatory Visit (HOSPITAL_COMMUNITY): Payer: Self-pay | Admitting: Internal Medicine

## 2019-02-06 DIAGNOSIS — I5022 Chronic systolic (congestive) heart failure: Secondary | ICD-10-CM

## 2019-02-09 ENCOUNTER — Encounter: Payer: Self-pay | Admitting: Internal Medicine

## 2019-02-09 NOTE — Patient Instructions (Signed)
Change Coumadin to 2.5 mg daily.  Follow-up September 8.  Continue Crestor 10 mg daily.

## 2019-02-09 NOTE — Progress Notes (Signed)
Here for follow-up on chronic anticoagulation.  Currently on Coumadin 3 mg daily and pro time INR is high at 3.5.  We are going to have her take 2.5 mg of Coumadin and follow-up in 1 week.  Previously INR was 2.4 in July.  She should not take 3 mg tablets and should discard them.  Also she was started on Crestor 10 mg daily as total cholesterol was 289 in July and triglycerides were 211 with an LDL of 204.  Female total cholesterol is 196, triglycerides 204 and LDL cholesterol 123.  Liver panel was stable.  Her blood pressure stable at 120/80, pulse 68, temperature 98.3 pulse oximetry 97% and weight 169 pounds.  She has no new complaints.  She will be following up here September 8

## 2019-02-18 ENCOUNTER — Telehealth: Payer: Self-pay

## 2019-02-18 ENCOUNTER — Other Ambulatory Visit: Payer: Medicare Other | Admitting: Internal Medicine

## 2019-02-18 NOTE — Telephone Encounter (Signed)
Patient called states she will not be coming here for her protime check, she said Dr. Aundra Dubin her cardiologist will take care of it.

## 2019-02-25 ENCOUNTER — Ambulatory Visit (HOSPITAL_COMMUNITY)
Admission: RE | Admit: 2019-02-25 | Discharge: 2019-02-25 | Disposition: A | Discharge status: Home / Self Care | Payer: Medicare Other | Source: Ambulatory Visit | Attending: Internal Medicine | Admitting: Internal Medicine

## 2019-02-25 ENCOUNTER — Other Ambulatory Visit: Payer: Self-pay

## 2019-02-25 ENCOUNTER — Ambulatory Visit (HOSPITAL_COMMUNITY)
Admission: RE | Admit: 2019-02-25 | Discharge: 2019-02-25 | Disposition: A | Discharge status: Home / Self Care | Payer: Medicare Other | Source: Ambulatory Visit | Attending: Cardiology | Admitting: Cardiology

## 2019-02-25 VITALS — BP 128/80 | HR 78 | Wt 170.2 lb

## 2019-02-25 DIAGNOSIS — Z79899 Other long term (current) drug therapy: Secondary | ICD-10-CM | POA: Diagnosis not present

## 2019-02-25 DIAGNOSIS — Z7901 Long term (current) use of anticoagulants: Secondary | ICD-10-CM | POA: Diagnosis not present

## 2019-02-25 DIAGNOSIS — Z8249 Family history of ischemic heart disease and other diseases of the circulatory system: Secondary | ICD-10-CM | POA: Diagnosis not present

## 2019-02-25 DIAGNOSIS — I5022 Chronic systolic (congestive) heart failure: Secondary | ICD-10-CM

## 2019-02-25 DIAGNOSIS — I480 Paroxysmal atrial fibrillation: Secondary | ICD-10-CM | POA: Diagnosis not present

## 2019-02-25 DIAGNOSIS — I340 Nonrheumatic mitral (valve) insufficiency: Secondary | ICD-10-CM | POA: Insufficient documentation

## 2019-02-25 DIAGNOSIS — M109 Gout, unspecified: Secondary | ICD-10-CM | POA: Insufficient documentation

## 2019-02-25 DIAGNOSIS — E039 Hypothyroidism, unspecified: Secondary | ICD-10-CM | POA: Insufficient documentation

## 2019-02-25 DIAGNOSIS — N184 Chronic kidney disease, stage 4 (severe): Secondary | ICD-10-CM | POA: Insufficient documentation

## 2019-02-25 DIAGNOSIS — I5042 Chronic combined systolic (congestive) and diastolic (congestive) heart failure: Secondary | ICD-10-CM | POA: Diagnosis not present

## 2019-02-25 DIAGNOSIS — Z79890 Hormone replacement therapy (postmenopausal): Secondary | ICD-10-CM | POA: Insufficient documentation

## 2019-02-25 DIAGNOSIS — E785 Hyperlipidemia, unspecified: Secondary | ICD-10-CM | POA: Diagnosis not present

## 2019-02-25 DIAGNOSIS — Z8711 Personal history of peptic ulcer disease: Secondary | ICD-10-CM | POA: Diagnosis not present

## 2019-02-25 DIAGNOSIS — I428 Other cardiomyopathies: Secondary | ICD-10-CM | POA: Diagnosis not present

## 2019-02-25 LAB — BASIC METABOLIC PANEL
Anion gap: 10 (ref 5–15)
BUN: 28 mg/dL — ABNORMAL HIGH (ref 8–23)
CO2: 22 mmol/L (ref 22–32)
Calcium: 10 mg/dL (ref 8.9–10.3)
Chloride: 110 mmol/L (ref 98–111)
Creatinine, Ser: 2.48 mg/dL — ABNORMAL HIGH (ref 0.44–1.00)
GFR calc Af Amer: 20 mL/min — ABNORMAL LOW (ref >60)
GFR calc non Af Amer: 17 mL/min — ABNORMAL LOW (ref >60)
Glucose, Bld: 122 mg/dL — ABNORMAL HIGH (ref 70–99)
Potassium: 4.1 mmol/L (ref 3.5–5.1)
Sodium: 142 mmol/L (ref 135–145)

## 2019-02-25 LAB — PROTIME-INR
INR: 2 — ABNORMAL HIGH (ref 0.8–1.2)
Prothrombin Time: 22.7 seconds — ABNORMAL HIGH (ref 11.4–15.2)

## 2019-02-25 MED ORDER — ROSUVASTATIN CALCIUM 20 MG PO TABS: 20.0000 mg | ORAL_TABLET | Freq: Every day | ORAL | 6 refills | Status: DC

## 2019-02-25 NOTE — Patient Instructions (Signed)
Increase Crestor to 20 mg daily  Labs today  Your physician recommends that you return for a FASTING lipid profile: 2 months  You have been referred to Cardiac Rehab, they will call you to schedule this  You have been referred to [facility], they will call you to schedule this  Your physician recommends that you schedule a follow-up appointment in: 6 weeks  At the [facility], you and your health needs are our priority. As part of our continuing mission to provide you with exceptional heart care, we have created designated Provider Care Teams. These Care Teams include your primary Cardiologist (physician) and Advanced Practice Providers (APPs- Physician Assistants and Nurse Practitioners) who all work together to provide you with the care you need, when you need it.   You may see any of the following providers on your designated Care Team at your next follow up: Marland Kitchen Dr Glori Bickers . Dr Loralie Champagne . Darrick Grinder, NP   Please be sure to bring in all your medications bottles to every appointment.

## 2019-02-25 NOTE — Addendum Note (Signed)
Encounter addended by: Larey Dresser, MD on: 02/25/2019 11:10 PM  Actions taken: Clinical Note Signed

## 2019-02-25 NOTE — Progress Notes (Addendum)
Advanced Heart Failure Clinic Note   Referring Physician: PCP: Elby Showers, MD PCP-Cardiologist: Ezzard Standing, MD  HF Cardiology: Dr. Aundra Dubin  HPI:  Casey [name] is a 84 y.o. female with long history of nonischemic cardiomyopathy, now CKD stage 3-4. She had cath in 5/05 with normal coronaries. She has MDT CRT-D device. Not on ACEI/ARB/ARNI due to renal dysfunction (tried all in the past). On warfarin with history of paroxysmal atrial fibrillation. Echo in 5/17 with EF 20%, moderate MR, moderate TR, small VSD.   Admitted 07/06/17 with increased SOB and decreased urine output. Echo with EF 10-15%, moderate-severe MR.  Diuresed with IV lasix and metolazone and lost 7 lbs with improvement in symptoms. Transitioned from Lasix to torsemide. Meds optimized but limited due to CKD IV.   TEE in 5/19 showed moderate central MR, not severe.   Left ankle fracture in 7/19 with fixation.   Echo was done today and reviewed, EF 15% with moderate LV dilation, mildly decreased RV systolic function, mild-moderate MR with normal IVC.   She returns for followup of CHF.  Weight is down 1 lb since last appointment.  She is not very active.  She says, however, that she is feeling good with no exertional dyspnea walking on flat ground. Dyspnea with heavier exertion like walking up stairs, inclines.  No orthopnea/PND.  No lightheadedness.  No palpitations.  No chest pain.     Labs (3/19): K 3.9, creatinine 2.6, hgb 15.3 Labs (4/19): LFTs normal Labs (5/19): K 4.9, creatinine 2.75, TSH normal, hgb 14.8 Labs (6/20): K 4.4, creatinine 3.01 Labs (8/20): LDL 123  ECG (personally reviewed): NSR with BiV pacing  [employer] device interrogation: >99% BiV pacing, no AF/VT, thoracic impedance stable.   Review of systems complete and found to be negative unless listed in HPI.    Past Medical History 1. Chronic systolic CHF: Long-standing nonischemic cardiomyopathy, 5/05 cath without significant CAD.  Echo in 5/17 with EF 20%.  - Echo 1/19 with EF 10-15%, prominent dyssynchrony, moderate-severe MR, PASP 91 (but ?if this represents VSD flow rather than TR).  - MDT CRT-D device.  - TEE (5/19): EF 15-20%, mild LV dilation, small peri-membranous VSD, moderate central MR, RV mildly dilated with normal systolic function.  - Echo (9/20): EF 15% with moderate LV dilation, mildly decreased RV systolic function, mild-moderate MR with normal IVC.  2. Atrial fibrillation: Paroxysmal. She is on amiodarone and warfarin.  3. CKD: Stage 4. Follows with nephrology (Dr. Justin Mend).  4. H/o VSD:  Small/restrictive.  5. Mitral regurgitation: Moderate-severe central MR on 1/19 echo, likely functional.   - Moderate central MR on 5/19 TEE.  6. Gout 7. Hypothyroidism 8. PUD  Current Outpatient Medications  Medication Sig Dispense Refill   allopurinol (ZYLOPRIM) 100 MG tablet Take 1 tablet (100 mg total) by mouth daily. 30 tablet 6   hydrALAZINE (APRESOLINE) 25 MG tablet TAKE 1 TABLET BY MOUTH EVERY 12 HOURS 180 tablet 1   isosorbide mononitrate (IMDUR) 30 MG 24 hr tablet Take 1 tablet by mouth daily 90 tablet 0   levothyroxine (SYNTHROID, LEVOTHROID) 75 MCG tablet Take 1 tablet (75 mcg total) by mouth daily before breakfast. 90 tablet 1   metoprolol succinate (TOPROL-XL) 50 MG 24 hr tablet Take 1 tablet (50 mg total) by mouth daily. Take with or immediately following a meal. 90 tablet 1   Multiple Vitamins-Calcium (ONE-A-DAY WOMENS FORMULA) TABS Take 1 tablet by mouth daily.       potassium chloride SA (  K-DUR,KLOR-CON) 20 MEQ tablet Take 1 tablet (20 mEq total) by mouth daily. 90 tablet 1   rosuvastatin (CRESTOR) 20 MG tablet Take 1 tablet (20 mg total) by mouth daily. 30 tablet 6   torsemide (DEMADEX) 20 MG tablet Take 3 tablets by mouth once daily 90 tablet 0   warfarin (COUMADIN) 2.5 MG tablet TAKE 1 TABLET BY MOUTH EVERY OTHER DAY 30 tablet 0   dorzolamide (TRUSOPT) 2 % ophthalmic solution Place 1  drop into both eyes 3 (three) times daily. 10 mL 0   No current facility-administered medications for this encounter.     Allergies  Allergen Reactions   Codeine Rash      Social History   Socioeconomic History   Marital status: Married    Spouse name: Not on file   Number of children: Not on file   Years of education: Not on file   Highest education level: Not on file  Occupational History   Not on file  Social Needs   Financial resource strain: Not on file   Food insecurity    Worry: Not on file    Inability: Not on file   Transportation needs    Medical: Not on file    Non-medical: Not on file  Tobacco Use   Smoking status: Never Smoker   Smokeless tobacco: Never Used  Substance and Sexual Activity   Alcohol use: No   Drug use: No   Sexual activity: Not on file  Lifestyle   Physical activity    Days per week: Not on file    Minutes per session: Not on file   Stress: Not on file  Relationships   Social connections    Talks on phone: Not on file    Gets together: Not on file    Attends religious service: Not on file    Active member of club or organization: Not on file    Attends meetings of clubs or organizations: Not on file    Relationship status: Not on file   Intimate partner violence    Fear of current or ex partner: Not on file    Emotionally abused: Not on file    Physically abused: Not on file    Forced sexual activity: Not on file  Other Topics Concern   Not on file  Social History Narrative   Not on file      Family History  Problem Relation Age of Onset   Heart failure Mother    Cancer Father    Vitals:   02/25/19 0947  BP: 128/80  Pulse: 78  SpO2: 90%  Weight: 77.2 kg (170 lb 3.2 oz)     Wt Readings from Last 3 Encounters:  02/25/19 77.2 kg (170 lb 3.2 oz)  01/14/19 76.7 kg (169 lb)  12/16/18 77.1 kg (170 lb)    PHYSICAL EXAM: General: NAD Neck: No JVD, no thyromegaly or thyroid nodule.  Lungs: Clear  to auscultation bilaterally with normal respiratory effort. CV: Nondisplaced PMI.  Heart regular S1/S2, no S3/S4, 1/6 HSM apex.  No peripheral edema.  No carotid bruit.  Normal pedal pulses.  Abdomen: Soft, nontender, no hepatosplenomegaly, no distention.  Skin: Intact without lesions or rashes.  Neurologic: Alert and oriented x 3.  Psych: Normal affect. Extremities: No clubbing or cyanosis.  HEENT: Normal.   ASSESSMENT & PLAN:  1. Chronic systolic CHF: Long-standing NICM, 5/05 cath without significant CAD. Echo in 5/17 with EF 20%, echo in 1/19 with EF 10-15%, prominent dyssynchrony,  moderate-severe MR, PASP 91 (but ?if this represents VSD flow rather than TR). Complicated by CKD stage 4. Has MDT CRT-D device. In 1/19, she underwent V-V optimization per EP.  TEE in 5/19 showed moderate MR, EF 15-20%.  Echo today showed EF 15%, mildly decreased RV systolic function, mild-moderate MR. On exam and by Optivol, she is not volume overloaded.  NYHA class II-IIII.  Medication titration is limited by CKD stage IV.  - Continue torsemide 60 mg daily.  BMET today.  - Continue Toprol XL 50 mg daily.   - No ACEI/ARB/ARNI: has failed in the past with AKI.  - Continue hydralazine 25 mg tid + Imdur 30 daily. Hydralazine had to be decreased in the past due to low BP.  - Most recent creatinine contraindication for spironolactone.  - I will refer to cardiac rehab.  2. Atrial fibrillation: Paroxysmal. Maintaining NSR.  - She is now off amiodarone, do not think that this needs to be restarted   - Continue warfarin.  3. CKD: Stage 4.  - BMET today.    - Refer to nephrology. 4. H/o VSD: Small peri-membranous VSD on 5/19 TEE.   5. Mitral regurgitation: TEE 5/19 with moderate functional MR.  Echo today showed mild-moderate MR.  Not candidate for Mitraclip.    6. Hyperlipidemia: Increase Crestor to 20 mg daily with lipids/LFTs in 2 months.   Followup in 6 wks.   Loralie Champagne, MD 02/25/19

## 2019-02-25 NOTE — Progress Notes (Signed)
  Echocardiogram 2D Echocardiogram has been performed.  Casey [name] 02/25/2019, 9:45 AM

## 2019-02-26 ENCOUNTER — Encounter (HOSPITAL_COMMUNITY): Payer: Self-pay | Admitting: *Deleted

## 2019-02-26 NOTE — Progress Notes (Signed)
Received referral from Dr. Aundra Dubin for this pt to particpate in Cardiac rehab with the diagnosis of Chronic Systolic Heart Failure.  Echo from 9/15 EF clarified 10-15%. Clinical review of pt follow up appt on 02/25/19  with Dr. Aundra Dubin heart failure office note. Pt is making the expected progress in recovery.  Pt appropriate for scheduling for on site cardiac rehab and/or enrollment in Virtual Cardiac Rehab.  Pt Covid score is 7.  Will forward to staff for follow up. Cherre Huger, BSN Cardiac and Training and development officer

## 2019-02-26 NOTE — Progress Notes (Signed)
Referral form, pt's demographics, OV note from 9/15 and labs for past 2 yrs all faxed to [facility] at [phone].  Pt's INR drawn on 9/15 sent to pt's PCP

## 2019-03-03 ENCOUNTER — Telehealth (HOSPITAL_COMMUNITY): Payer: Self-pay

## 2019-03-03 ENCOUNTER — Telehealth (HOSPITAL_COMMUNITY): Payer: Self-pay | Admitting: *Deleted

## 2019-03-03 NOTE — Telephone Encounter (Signed)
Cardiac Rehab - Pharmacy Resident Documentation   Patient unable to be reached after three call attempts. Please complete allergy verification and medication review during patient's cardiac rehab appointment.    Cristela Felt, PharmD PGY1 Pharmacy Resident [employer]: [phone]

## 2019-03-04 ENCOUNTER — Encounter: Payer: Self-pay | Admitting: Internal Medicine

## 2019-03-04 ENCOUNTER — Telehealth: Payer: Self-pay | Admitting: Internal Medicine

## 2019-03-04 ENCOUNTER — Other Ambulatory Visit: Payer: Self-pay

## 2019-03-04 ENCOUNTER — Encounter (HOSPITAL_COMMUNITY)
Admission: RE | Admit: 2019-03-04 | Discharge: 2019-03-04 | Disposition: A | Discharge status: Home / Self Care | Payer: Medicare Other | Source: Ambulatory Visit | Attending: Cardiology | Admitting: Cardiology

## 2019-03-04 ENCOUNTER — Encounter (HOSPITAL_COMMUNITY): Payer: Self-pay

## 2019-03-04 ENCOUNTER — Telehealth (HOSPITAL_COMMUNITY): Payer: Self-pay | Admitting: *Deleted

## 2019-03-04 ENCOUNTER — Other Ambulatory Visit: Payer: Self-pay | Admitting: Internal Medicine

## 2019-03-04 VITALS — BP 98/70 | Temp 97.2°F | Ht 61.25 in | Wt 171.7 lb

## 2019-03-04 DIAGNOSIS — I5022 Chronic systolic (congestive) heart failure: Secondary | ICD-10-CM

## 2019-03-04 HISTORY — DX: Hyperlipidemia, unspecified: E78.5

## 2019-03-04 NOTE — Progress Notes (Signed)
Cardiac Individual Treatment Plan  Patient Details  Name: Casey [name] MRN: [id number] Date of Birth: November 25, 1934 Referring Provider:     [place] from 03/04/2019 in [facility]  Referring Provider  Dr. Aundra Dubin       Initial Encounter Date:    CARDIAC REHAB PHASE II ORIENTATION from 03/04/2019 in [facility]  Date  03/04/19      Visit Diagnosis: Heart failure, chronic systolic ([place])  Patient's Home Medications on Admission:  Current Outpatient Medications:  .  allopurinol (ZYLOPRIM) 100 MG tablet, Take 1 tablet (100 mg total) by mouth daily., Disp: 30 tablet, Rfl: 6 .  dorzolamide (TRUSOPT) 2 % ophthalmic solution, Place 1 drop into both eyes 3 (three) times daily., Disp: 10 mL, Rfl: 0 .  hydrALAZINE (APRESOLINE) 25 MG tablet, TAKE 1 TABLET BY MOUTH EVERY 12 HOURS, Disp: 180 tablet, Rfl: 1 .  isosorbide mononitrate (IMDUR) 30 MG 24 hr tablet, Take 1 tablet by mouth daily, Disp: 90 tablet, Rfl: 0 .  levothyroxine (SYNTHROID, LEVOTHROID) 75 MCG tablet, Take 1 tablet (75 mcg total) by mouth daily before breakfast., Disp: 90 tablet, Rfl: 1 .  metoprolol succinate (TOPROL-XL) 50 MG 24 hr tablet, Take 1 tablet (50 mg total) by mouth daily. Take with or immediately following a meal., Disp: 90 tablet, Rfl: 1 .  Multiple Vitamins-Calcium (ONE-A-DAY WOMENS FORMULA) TABS, Take 1 tablet by mouth daily.  , Disp: , Rfl:  .  potassium chloride SA (K-DUR,KLOR-CON) 20 MEQ tablet, Take 1 tablet (20 mEq total) by mouth daily., Disp: 90 tablet, Rfl: 1 .  rosuvastatin (CRESTOR) 20 MG tablet, Take 1 tablet (20 mg total) by mouth daily., Disp: 30 tablet, Rfl: 6 .  torsemide (DEMADEX) 20 MG tablet, Take 3 tablets by mouth once daily, Disp: 90 tablet, Rfl: 0 .  warfarin (COUMADIN) 2.5 MG tablet, TAKE 1 TABLET BY MOUTH EVERY OTHER DAY, Disp: 30 tablet, Rfl: 0  Past Medical History: Past Medical History:  Diagnosis  Date  . Atherosclerosis of aorta ([place])   . Biventricular ICD (implantable cardiac defibrillator) in place    medtronic   . CHF (congestive heart failure) ([employer])   . Chronic kidney disease (CKD), stage III (moderate) ([facility])   . Chronic systolic heart failure ([place])   . Gout   . History of peptic ulcer disease   . Hyperlipidemia   . Hypertension   . Hypothyroidism   . Lumbar disc disease   . Nonischemic cardiomyopathy ([place])   . Ventricular septal defect   . VT (ventricular tachycardia) ([place])    appropriate therapy via ICD 2013    Tobacco Use: Social History   Tobacco Use  Smoking Status Never Smoker  Smokeless Tobacco Never Used    Labs: Recent Review Flowsheet Data    Labs for ITP Cardiac and Pulmonary Rehab Latest Ref Rng & Units 07/11/2017 03/21/2018 12/02/2018 12/16/2018 01/13/2019   Cholestrol <200 mg/dL - - 320(H) 289(H) 196   LDLCALC mg/dL (calc) - - 233(H) 204(H) 123(H)   HDL > OR = 50 mg/dL - - 50 45(L) 41(L)   Trlycerides <150 mg/dL - - 195(H) 211(H) 204(H)   Hemoglobin A1c <5.7 % of total Hgb - 5.6 - 6.0(H) -   O2SAT % 44.3 - - - -      Capillary Blood Glucose: Lab Results  Component Value Date   GLUCAP 131 (H) 04/16/2013     Exercise Target Goals: Exercise Program Goal:  Individual exercise prescription set using results from initial 6 min walk test and THRR while considering  patient's activity barriers and safety.   Exercise Prescription Goal: Starting with aerobic activity 30 plus minutes a day, 3 days per week for initial exercise prescription. Provide home exercise prescription and guidelines that participant acknowledges understanding prior to discharge.  Activity Barriers & Risk Stratification: Activity Barriers & Cardiac Risk Stratification - 03/04/19 1138      Activity Barriers & Cardiac Risk Stratification   Activity Barriers  Other (comment);Balance Concerns;Deconditioning;Muscular Weakness;Assistive Device;History of Falls    Comments  Broken  ankle in 2019.    Cardiac Risk Stratification  High       6 Minute Walk: 6 Minute Walk    Row Name 03/04/19 1137         6 Minute Walk   Phase  Initial     Distance  719 feet     Walk Time  6 minutes     # of Rest Breaks  2 Stop at 2:35, Start at 2:46, Stop at 4:11, Start at 5:06     MPH  1.3     METS  0.8     RPE  13     Perceived Dyspnea   0     VO2 Peak  3.03     Symptoms  Yes (comment)     Comments  Fatigue     Resting HR  83 bpm     Resting BP  98/70     Resting Oxygen Saturation   97 %     Exercise Oxygen Saturation  during 6 min walk  95 %     Max Ex. HR  125 bpm     Max Ex. BP  102/60     2 Minute Post BP  98/60        Oxygen Initial Assessment:   Oxygen Re-Evaluation:   Oxygen Discharge (Final Oxygen Re-Evaluation):   Initial Exercise Prescription: Initial Exercise Prescription - 03/04/19 1100      Date of Initial Exercise RX and Referring Provider   Date  03/04/19    Referring Provider  Dr. Aundra Dubin     Expected Discharge Date  05/02/19      NuStep   Level  2    SPM  75    Minutes  15    METs  1      Prescription Details   Frequency (times per week)  3    Duration  Progress to 30 minutes of continuous aerobic without signs/symptoms of physical distress      Intensity   THRR 40-80% of Max Heartrate  55-110    Ratings of Perceived Exertion  11-13      Progression   Progression  Continue to progress workloads to maintain intensity without signs/symptoms of physical distress.      Resistance Training   Training Prescription  Yes    Weight  2 lbs.     Reps  10-15       Perform Capillary Blood Glucose checks as needed.  Exercise Prescription Changes:   Exercise Comments:   Exercise Goals and Review: Exercise Goals    Row Name 03/04/19 1141             Exercise Goals   Increase Physical Activity  Yes       Intervention  Provide advice, education, support and counseling about physical activity/exercise needs.;Develop an  individualized exercise prescription for aerobic and resistive training based on  initial evaluation findings, risk stratification, comorbidities and participant's personal goals.       Expected Outcomes  Short Term: Attend rehab on a regular basis to increase amount of physical activity.;Long Term: Add in home exercise to make exercise part of routine and to increase amount of physical activity.;Long Term: Exercising regularly at least 3-5 days a week.       Increase Strength and Stamina  Yes       Intervention  Provide advice, education, support and counseling about physical activity/exercise needs.;Develop an individualized exercise prescription for aerobic and resistive training based on initial evaluation findings, risk stratification, comorbidities and participant's personal goals.       Expected Outcomes  Short Term: Increase workloads from initial exercise prescription for resistance, speed, and METs.;Short Term: Perform resistance training exercises routinely during rehab and add in resistance training at home;Long Term: Improve cardiorespiratory fitness, muscular endurance and strength as measured by increased METs and functional capacity (6MWT)       Able to understand and use rate of perceived exertion (RPE) scale  Yes       Intervention  Provide education and explanation on how to use RPE scale       Expected Outcomes  Short Term: Able to use RPE daily in rehab to express subjective intensity level;Long Term:  Able to use RPE to guide intensity level when exercising independently       Knowledge and understanding of Target Heart Rate Range (THRR)  Yes       Intervention  Provide education and explanation of THRR including how the numbers were predicted and where they are located for reference       Expected Outcomes  Short Term: Able to state/look up THRR;Long Term: Able to use THRR to govern intensity when exercising independently;Short Term: Able to use daily as guideline for intensity in  rehab       Able to check pulse independently  Yes       Intervention  Provide education and demonstration on how to check pulse in carotid and radial arteries.;Review the importance of being able to check your own pulse for safety during independent exercise       Expected Outcomes  Long Term: Able to check pulse independently and accurately;Short Term: Able to explain why pulse checking is important during independent exercise       Understanding of Exercise Prescription  Yes       Intervention  Provide education, explanation, and written materials on patient's individual exercise prescription       Expected Outcomes  Short Term: Able to explain program exercise prescription;Long Term: Able to explain home exercise prescription to exercise independently          Exercise Goals Re-Evaluation :    Discharge Exercise Prescription (Final Exercise Prescription Changes):   Nutrition:  Target Goals: Understanding of nutrition guidelines, daily intake of sodium 1500mg , cholesterol 200mg , calories 30% from fat and 7% or less from saturated fats, daily to have 5 or more servings of fruits and vegetables.  Biometrics: Pre Biometrics - 03/04/19 1141      Pre Biometrics   Height  5' 1.25" (1.556 m)    Weight  77.9 kg    Waist Circumference  43 inches    Hip Circumference  47.5 inches    Waist to Hip Ratio  0.91 %    BMI (Calculated)  32.17    Triceps Skinfold  31 mm    % Body Fat  46.4 %  Grip Strength  22 kg    Flexibility  0 in    Single Leg Stand  2.68 seconds        Nutrition Therapy Plan and Nutrition Goals:   Nutrition Assessments:   Nutrition Goals Re-Evaluation:   Nutrition Goals Discharge (Final Nutrition Goals Re-Evaluation):   Psychosocial: Target Goals: Acknowledge presence or absence of significant depression and/or stress, maximize coping skills, provide positive support system. Participant is able to verbalize types and ability to use techniques and skills  needed for reducing stress and depression.  Initial Review & Psychosocial Screening: Initial Psych Review & Screening - 03/04/19 1029      Initial Review   Current issues with  History of Depression;Current Stress Concerns    Source of Stress Concerns  Family    Comments  Gracious's husband is at [facility] due to his health and hasnt been able to see him since the pandemic started      [place]?  Yes   Adelene has her children, and neighbors for support     Barriers   Psychosocial barriers to participate in program  The patient should benefit from training in stress management and relaxation.      Screening Interventions   Interventions  Encouraged to exercise;Provide feedback about the scores to participant    Expected Outcomes  Long Term Goal: Stressors or current issues are controlled or eliminated.;Short Term goal: Identification and review with participant of any Quality of Life or Depression concerns found by scoring the questionnaire.       Quality of Life Scores: Quality of Life - 03/04/19 1142      Quality of Life   Select  Quality of Life      Quality of Life Scores   Health/Function Pre  23.14 %    Socioeconomic Pre  24 %    Psych/Spiritual Pre  21.43 %    Family Pre  20.4 %    GLOBAL Pre  22.55 %      Scores of 19 and below usually indicate a poorer quality of life in these areas.  A difference of  2-3 points is a clinically meaningful difference.  A difference of 2-3 points in the total score of the Quality of Life Index has been associated with significant improvement in overall quality of life, self-image, physical symptoms, and general health in studies assessing change in quality of life.  PHQ-9: Recent Review Flowsheet Data    Depression screen [facility] 2/9 03/04/2019 10/14/2018 03/21/2018 01/26/2017 06/11/2015   Decreased Interest 0 0 0 1 1   Down, Depressed, Hopeless 0 0 0 0 0   PHQ - 2 Score 0 0 0 1 1     Interpretation of  Total Score  Total Score Depression Severity:  1-4 = Minimal depression, 5-9 = Mild depression, 10-14 = Moderate depression, 15-19 = Moderately severe depression, 20-27 = Severe depression   Psychosocial Evaluation and Intervention:   Psychosocial Re-Evaluation:   Psychosocial Discharge (Final Psychosocial Re-Evaluation):   Vocational Rehabilitation: Provide vocational rehab assistance to qualifying candidates.   Vocational Rehab Evaluation & Intervention: Vocational Rehab - 03/04/19 1042      Initial Vocational Rehab Evaluation & Intervention   Assessment shows need for Vocational Rehabilitation  No       Education: Education Goals: Education classes will be provided on a weekly basis, covering required topics. Participant will state understanding/return demonstration of topics presented.  Learning Barriers/Preferences: Learning Barriers/Preferences - 03/04/19 1200  Learning Barriers/Preferences   Learning Barriers  --   Patient is very hard of hearing will contact primary care physician      Education Topics: Hypertension, Hypertension Reduction -Define heart disease and high blood pressure. Discus how high blood pressure affects the body and ways to reduce high blood pressure.   Exercise and Your Heart -Discuss why it is important to exercise, the FITT principles of exercise, normal and abnormal responses to exercise, and how to exercise safely.   Angina -Discuss definition of angina, causes of angina, treatment of angina, and how to decrease risk of having angina.   Cardiac Medications -Review what the following cardiac medications are used for, how they affect the body, and side effects that may occur when taking the medications.  Medications include Aspirin, Beta blockers, calcium channel blockers, ACE Inhibitors, angiotensin receptor blockers, diuretics, digoxin, and antihyperlipidemics.   Congestive Heart Failure -Discuss the definition of CHF, how to  live with CHF, the signs and symptoms of CHF, and how keep track of weight and sodium intake.   Heart Disease and Intimacy -Discus the effect sexual activity has on the heart, how changes occur during intimacy as we age, and safety during sexual activity.   Smoking Cessation / COPD -Discuss different methods to quit smoking, the health benefits of quitting smoking, and the definition of COPD.   Nutrition I: Fats -Discuss the types of cholesterol, what cholesterol does to the heart, and how cholesterol levels can be controlled.   Nutrition II: Labels -Discuss the different components of food labels and how to read food label   Heart Parts/Heart Disease and PAD -Discuss the anatomy of the heart, the pathway of blood circulation through the heart, and these are affected by heart disease.   Stress I: Signs and Symptoms -Discuss the causes of stress, how stress may lead to anxiety and depression, and ways to limit stress.   Stress II: Relaxation -Discuss different types of relaxation techniques to limit stress.   Warning Signs of Stroke / TIA -Discuss definition of a stroke, what the signs and symptoms are of a stroke, and how to identify when someone is having stroke.   Knowledge Questionnaire Score: Knowledge Questionnaire Score - 03/04/19 1145      Knowledge Questionnaire Score   Pre Score  17/24       Core Components/Risk Factors/Patient Goals at Admission: Personal Goals and Risk Factors at Admission - 03/04/19 1145      Core Components/Risk Factors/Patient Goals on Admission    Weight Management  Yes;Weight Maintenance;Weight Loss    Intervention  Weight Management: Develop a combined nutrition and exercise program designed to reach desired caloric intake, while maintaining appropriate intake of nutrient and fiber, sodium and fats, and appropriate energy expenditure required for the weight goal.;Weight Management: Provide education and appropriate resources to help  participant work on and attain dietary goals.;Weight Management/Obesity: Establish reasonable short term and long term weight goals.    Admit Weight  171 lb 11.8 oz (77.9 kg)    Expected Outcomes  Short Term: Continue to assess and modify interventions until short term weight is achieved;Long Term: Adherence to nutrition and physical activity/exercise program aimed toward attainment of established weight goal;Weight Maintenance: Understanding of the daily nutrition guidelines, which includes 25-35% calories from fat, 7% or less cal from saturated fats, less than 200mg  cholesterol, less than 1.5gm of sodium, & 5 or more servings of fruits and vegetables daily;Weight Loss: Understanding of general recommendations for a balanced deficit meal plan,  which promotes 1-2 lb weight loss per week and includes a negative energy balance of [phone] kcal/d;Understanding recommendations for meals to include 15-35% energy as protein, 25-35% energy from fat, 35-60% energy from carbohydrates, less than 200mg  of dietary cholesterol, 20-35 gm of total fiber daily;Understanding of distribution of calorie intake throughout the day with the consumption of 4-5 meals/snacks    Heart Failure  Yes    Expected Outcomes  Improve functional capacity of life;Short term: Attendance in program 2-3 days a week with increased exercise capacity. Reported lower sodium intake. Reported increased fruit and vegetable intake. Reports medication compliance.;Short term: Daily weights obtained and reported for increase. Utilizing diuretic protocols set by physician.;Long term: Adoption of self-care skills and reduction of barriers for early signs and symptoms recognition and intervention leading to self-care maintenance.    Hypertension  Yes    Intervention  Provide education on lifestyle modifcations including regular physical activity/exercise, weight management, moderate sodium restriction and increased consumption of fresh fruit, vegetables, and  low fat dairy, alcohol moderation, and smoking cessation.;Monitor prescription use compliance.    Expected Outcomes  Short Term: Continued assessment and intervention until BP is < 140/45mm HG in hypertensive participants. < 130/24mm HG in hypertensive participants with diabetes, heart failure or chronic kidney disease.;Long Term: Maintenance of blood pressure at goal levels.    Lipids  Yes    Intervention  Provide education and support for participant on nutrition & aerobic/resistive exercise along with prescribed medications to achieve LDL 70mg , HDL >40mg .    Expected Outcomes  Short Term: Participant states understanding of desired cholesterol values and is compliant with medications prescribed. Participant is following exercise prescription and nutrition guidelines.;Long Term: Cholesterol controlled with medications as prescribed, with individualized exercise RX and with personalized nutrition plan. Value goals: LDL < 70mg , HDL > 40 mg.    Stress  Yes    Intervention  Offer individual and/or small group education and counseling on adjustment to heart disease, stress management and health-related lifestyle change. Teach and support self-help strategies.;Refer participants experiencing significant psychosocial distress to appropriate mental health specialists for further evaluation and treatment. When possible, include family members and significant others in education/counseling sessions.    Expected Outcomes  Short Term: Participant demonstrates changes in health-related behavior, relaxation and other stress management skills, ability to obtain effective social support, and compliance with psychotropic medications if prescribed.;Long Term: Emotional wellbeing is indicated by absence of clinically significant psychosocial distress or social isolation.       Core Components/Risk Factors/Patient Goals Review:    Core Components/Risk Factors/Patient Goals at Discharge (Final Review):    ITP  Comments: ITP Comments    Row Name 03/04/19 1017           ITP Comments  Medical Director- Dr. Fransico Him, MD          Comments:Patient attended orientation on 03/04/2019 to review rules and guidelines for program.  Completed 6 minute walk test, Intitial ITP, and exercise prescription.  VSS. Telemetry-AV paced on demand with PVC's.Entry blood pressure 98/70. Patient asymptomatic but reports that her blood pressure runs low when she checks it at home at times. Max blood pressure 102/60 during her walk test. Post exercise walk test blood pressure 98/60. Talise did stop times two during her walk test due to deconditioning and used a rollator.Karey says she does not use a cane at home but she does use a cane when she goes out. Falicity did exceed her target heart rate at 125.Patieent also reported feeling  fatigued during her walk test as she does not do much walking at home. . Safety measures and social distancing in place per CDC guidelines. Rillie was noted to be very hard of hearing and does not use a hearing aide. Will notify patient's primary care physician Dr Renold Genta.Barnet Pall, RN,BSN 03/04/2019 12:16 PM

## 2019-03-04 NOTE — Progress Notes (Signed)
Cardiac Rehab Medication Review by a Nurse  Does the patient  feel that his/her medications are working for him/her?  yes  Has the patient been experiencing any side effects to the medications prescribed?  no  Does the patient measure his/her own blood pressure or blood glucose at home?  yes   Does the patient have any problems obtaining medications due to transportation or finances?   no  Understanding of regimen: good Understanding of indications: good Potential of compliance: good    Nurse comments: Latarshia is compliant with her medications and is taking as prescribed.    Christa See [facility] RN 03/04/2019 10:19 AM

## 2019-03-04 NOTE — Progress Notes (Signed)
Dr Claris Gladden office notified about today's blood pressures at cardiac rehab orientation as Casey [name]'s resting systolic blood pressures were noted at 98/60 pre and 98/70 post. Spoke with [facility]. Will send today's ECG tracings over for Dr Aundra Dubin to review as Casey [name] went about her target heart rate. Resting heart rate 83 pre, 84 post.Casey Whitaker, RN,BSN 03/04/2019 1:34 PM

## 2019-03-04 NOTE — Telephone Encounter (Signed)
Casey [name] w/cardiac rehab called to report pt was asymptomatic but her bp was 90/70. Pt has had some falls in the past so Casey [name] was concerned with patients high dose of medications. Per Dr.McLean as long as patient is asymptomatic and bp was not too low to continue with current regimen (no changes).

## 2019-03-04 NOTE — Telephone Encounter (Signed)
Cardiac rehab staff concerned about hearing loss. Called pt today who is agreeable to seeing ENT physician/Audiologist. Given phone number to [facility]. Can fax order for Audiology referral

## 2019-03-09 ENCOUNTER — Other Ambulatory Visit: Payer: Self-pay | Admitting: Internal Medicine

## 2019-03-09 ENCOUNTER — Other Ambulatory Visit (HOSPITAL_COMMUNITY): Payer: Self-pay | Admitting: Internal Medicine

## 2019-03-09 DIAGNOSIS — I5022 Chronic systolic (congestive) heart failure: Secondary | ICD-10-CM

## 2019-03-10 ENCOUNTER — Encounter (HOSPITAL_COMMUNITY)
Admission: RE | Admit: 2019-03-10 | Discharge: 2019-03-10 | Disposition: A | Discharge status: Home / Self Care | Payer: Medicare Other | Source: Ambulatory Visit | Attending: Cardiology | Admitting: Cardiology

## 2019-03-10 ENCOUNTER — Other Ambulatory Visit: Payer: Self-pay

## 2019-03-10 DIAGNOSIS — I5022 Chronic systolic (congestive) heart failure: Secondary | ICD-10-CM | POA: Diagnosis not present

## 2019-03-10 NOTE — Progress Notes (Signed)
Daily Session Note  Patient Details  Name: Casey [name] MRN: [id number] Date of Birth: Sep 27, 1934 Referring Provider:     [place] from 03/04/2019 in [place]  Referring Provider  Dr. Aundra Dubin       Encounter Date: 03/10/2019  Check In: Session Check In - 03/10/19 0850      Check-In   Supervising physician immediately available to respond to emergencies  [facility] immediately available    Physician(s)  Dr. Marthenia Rolling    Location  [facility]    Staff Present  Trish Fountain, RN, Deland Pretty, MS, ACSM CEP, Exercise Physiologist;Brittany Durene Fruits, BS, ACSM CEP, Exercise Physiologist;Dalton Kris Mouton, MS, Exercise Physiologist;Nainika Newlun Karle Starch, RN, BSN    Virtual Visit  No    Medication changes reported      No    Fall or balance concerns reported     No    Tobacco Cessation  No Change    Warm-up and Cool-down  Performed on first and last piece of equipment    Resistance Training Performed  Yes    VAD Patient?  No    PAD/SET Patient?  No      Pain Assessment   Currently in Pain?  No/denies    Pain Score  0-No pain    Multiple Pain Sites  No       Capillary Blood Glucose: No results found for this or any previous visit (from the past 24 hour(s)).  Exercise Prescription Changes - 03/10/19 1100      Response to Exercise   Blood Pressure (Admit)  96/60    Blood Pressure (Exercise)  100/62    Blood Pressure (Exit)  90/52    Heart Rate (Admit)  100 bpm    Heart Rate (Exercise)  120 bpm    Heart Rate (Exit)  98 bpm    Rating of Perceived Exertion (Exercise)  11    Symptoms  Right leg cramp.     Comments  Pt first day of exercise.     Duration  Continue with 30 min of aerobic exercise without signs/symptoms of physical distress.    Intensity  THRR unchanged      Progression   Progression  Continue to progress workloads to maintain intensity without signs/symptoms of physical distress.    Average METs  1.4      Resistance Training   Training Prescription  Yes    Weight  2 lbs.     Reps  10-15    Time  10 Minutes      Interval Training   Interval Training  No      NuStep   Level  2    SPM  75    Minutes  20    METs  1.4       Social History   Tobacco Use  Smoking Status Never Smoker  Smokeless Tobacco Never Used    Goals Met:  Exercise tolerated well  Goals Unmet:  Not Applicable  Comments: Pt started cardiac rehab today.  Pt tolerated light exercise without difficulty. VSS, telemetry-AV paced, asymptomatic.  Medication list reconciled. Pt denies barriers to medicaiton compliance.  PSYCHOSOCIAL ASSESSMENT:  PHQ-0. Pt exhibits positive coping skills, hopeful outlook with supportive family. No psychosocial needs identified at this time, no psychosocial interventions necessary.  Pt oriented to exercise equipment and routine.    Understanding verbalized.    Dr. Fransico Him is Medical Director for Cardiac Rehab at [facility].

## 2019-03-12 ENCOUNTER — Other Ambulatory Visit: Payer: Self-pay

## 2019-03-12 ENCOUNTER — Encounter (HOSPITAL_COMMUNITY)
Admission: RE | Admit: 2019-03-12 | Discharge: 2019-03-12 | Disposition: A | Discharge status: Home / Self Care | Payer: Medicare Other | Source: Ambulatory Visit | Attending: Cardiology | Admitting: Cardiology

## 2019-03-12 DIAGNOSIS — Z23 Encounter for immunization: Secondary | ICD-10-CM | POA: Diagnosis not present

## 2019-03-12 DIAGNOSIS — I5022 Chronic systolic (congestive) heart failure: Secondary | ICD-10-CM

## 2019-03-13 ENCOUNTER — Telehealth (HOSPITAL_COMMUNITY): Payer: Self-pay | Admitting: *Deleted

## 2019-03-13 NOTE — Telephone Encounter (Signed)
Spoke with Casey [name] she plans to return to exercise on Monday as she had a minor accident on Wednesday parking in garage prior to exercise at cardiac rehab. Casey [name]'s son will bring her to future exercise sessions starting on 03/17/19. Casey [name] says she will not attend exercise the rest of the week.Barnet Pall, RN,BSN 03/13/2019 3:43 PM

## 2019-03-14 ENCOUNTER — Encounter (HOSPITAL_COMMUNITY): Payer: Medicare Other

## 2019-03-14 ENCOUNTER — Telehealth (HOSPITAL_COMMUNITY): Payer: Self-pay | Admitting: Internal Medicine

## 2019-03-17 ENCOUNTER — Encounter (HOSPITAL_COMMUNITY): Payer: Medicare Other

## 2019-03-17 ENCOUNTER — Telehealth (HOSPITAL_COMMUNITY): Payer: Self-pay

## 2019-03-17 NOTE — Telephone Encounter (Signed)
Pt call and wanted to move to 145 class, starting 10/12.

## 2019-03-19 ENCOUNTER — Encounter (HOSPITAL_COMMUNITY): Payer: Medicare Other

## 2019-03-20 ENCOUNTER — Other Ambulatory Visit: Payer: Self-pay

## 2019-03-21 ENCOUNTER — Encounter (HOSPITAL_COMMUNITY): Payer: Medicare Other

## 2019-03-24 ENCOUNTER — Encounter (HOSPITAL_COMMUNITY): Payer: Medicare Other

## 2019-03-24 ENCOUNTER — Telehealth (HOSPITAL_COMMUNITY): Payer: Self-pay | Admitting: Internal Medicine

## 2019-03-24 ENCOUNTER — Ambulatory Visit (HOSPITAL_COMMUNITY): Payer: Medicare Other

## 2019-03-26 ENCOUNTER — Encounter (HOSPITAL_COMMUNITY): Payer: Medicare Other

## 2019-03-26 ENCOUNTER — Ambulatory Visit (HOSPITAL_COMMUNITY): Payer: Medicare Other

## 2019-03-26 ENCOUNTER — Encounter (HOSPITAL_COMMUNITY): Payer: Self-pay | Admitting: *Deleted

## 2019-03-26 DIAGNOSIS — I5022 Chronic systolic (congestive) heart failure: Secondary | ICD-10-CM

## 2019-03-26 NOTE — Progress Notes (Signed)
Patient attended one exercise session on 03/11/29 and orientation. Patient decided not to return to cardiac rehab and will be discharged from the [place], RN,BSN 03/26/2019 11:00 AM

## 2019-03-28 ENCOUNTER — Ambulatory Visit (HOSPITAL_COMMUNITY): Payer: Medicare Other

## 2019-03-28 ENCOUNTER — Encounter (HOSPITAL_COMMUNITY): Payer: Medicare Other

## 2019-03-31 ENCOUNTER — Encounter (HOSPITAL_COMMUNITY): Payer: Medicare Other

## 2019-03-31 ENCOUNTER — Ambulatory Visit (HOSPITAL_COMMUNITY): Payer: Medicare Other

## 2019-04-01 ENCOUNTER — Other Ambulatory Visit: Payer: Self-pay | Admitting: Internal Medicine

## 2019-04-01 ENCOUNTER — Ambulatory Visit (INDEPENDENT_AMBULATORY_CARE_PROVIDER_SITE_OTHER): Payer: Medicare Other | Admitting: *Deleted

## 2019-04-01 DIAGNOSIS — I480 Paroxysmal atrial fibrillation: Secondary | ICD-10-CM

## 2019-04-01 DIAGNOSIS — I428 Other cardiomyopathies: Secondary | ICD-10-CM

## 2019-04-01 DIAGNOSIS — E039 Hypothyroidism, unspecified: Secondary | ICD-10-CM

## 2019-04-02 ENCOUNTER — Ambulatory Visit (HOSPITAL_COMMUNITY): Payer: Medicare Other

## 2019-04-02 ENCOUNTER — Encounter (HOSPITAL_COMMUNITY): Payer: Medicare Other

## 2019-04-02 LAB — CUP PACEART REMOTE DEVICE CHECK
Battery Remaining Longevity: 9 mo
Battery Voltage: 2.86 V
Brady Statistic AP VP Percent: 1.98 %
Brady Statistic AP VS Percent: 0.01 %
Brady Statistic AS VP Percent: 97.25 %
Brady Statistic AS VS Percent: 0.75 %
Brady Statistic RA Percent Paced: 1.99 %
Brady Statistic RV Percent Paced: 98.51 %
Date Time Interrogation Session: 20201020082505
HighPow Impedance: 41 Ohm
HighPow Impedance: 47 Ohm
Implantable Lead Implant Date: 20050525
Implantable Lead Implant Date: 20050525
Implantable Lead Implant Date: 20071119
Implantable Lead Location: 753858
Implantable Lead Location: 753859
Implantable Lead Location: 753860
Implantable Lead Model: 4193
Implantable Lead Model: 5076
Implantable Lead Model: 6947
Implantable Pulse Generator Implant Date: 20141105
Lead Channel Impedance Value: 285 Ohm
Lead Channel Impedance Value: 399 Ohm
Lead Channel Impedance Value: 4047 Ohm
Lead Channel Impedance Value: 4047 Ohm
Lead Channel Impedance Value: 456 Ohm
Lead Channel Impedance Value: 532 Ohm
Lead Channel Pacing Threshold Amplitude: 0.5 V
Lead Channel Pacing Threshold Amplitude: 1.375 V
Lead Channel Pacing Threshold Amplitude: 2.5 V
Lead Channel Pacing Threshold Pulse Width: 0.4 ms
Lead Channel Pacing Threshold Pulse Width: 0.4 ms
Lead Channel Pacing Threshold Pulse Width: 0.6 ms
Lead Channel Sensing Intrinsic Amplitude: 2.125 mV
Lead Channel Sensing Intrinsic Amplitude: 2.125 mV
Lead Channel Sensing Intrinsic Amplitude: 2.375 mV
Lead Channel Sensing Intrinsic Amplitude: 2.375 mV
Lead Channel Setting Pacing Amplitude: 2 V
Lead Channel Setting Pacing Amplitude: 2 V
Lead Channel Setting Pacing Amplitude: 3.75 V
Lead Channel Setting Pacing Pulse Width: 0.4 ms
Lead Channel Setting Pacing Pulse Width: 0.6 ms
Lead Channel Setting Sensing Sensitivity: 0.3 mV

## 2019-04-04 ENCOUNTER — Ambulatory Visit (HOSPITAL_COMMUNITY): Payer: Medicare Other

## 2019-04-04 ENCOUNTER — Encounter (HOSPITAL_COMMUNITY): Payer: Medicare Other

## 2019-04-07 ENCOUNTER — Encounter (HOSPITAL_COMMUNITY): Payer: Medicare Other

## 2019-04-07 ENCOUNTER — Ambulatory Visit (HOSPITAL_COMMUNITY): Payer: Medicare Other

## 2019-04-07 ENCOUNTER — Ambulatory Visit (HOSPITAL_COMMUNITY)
Admission: RE | Admit: 2019-04-07 | Discharge: 2019-04-07 | Disposition: A | Discharge status: Home / Self Care | Payer: Medicare Other | Source: Ambulatory Visit | Attending: Cardiology | Admitting: Cardiology

## 2019-04-07 ENCOUNTER — Other Ambulatory Visit: Payer: Self-pay

## 2019-04-07 ENCOUNTER — Encounter (HOSPITAL_COMMUNITY): Payer: Self-pay | Admitting: Cardiology

## 2019-04-07 DIAGNOSIS — M109 Gout, unspecified: Secondary | ICD-10-CM | POA: Insufficient documentation

## 2019-04-07 DIAGNOSIS — N184 Chronic kidney disease, stage 4 (severe): Secondary | ICD-10-CM | POA: Insufficient documentation

## 2019-04-07 DIAGNOSIS — I5022 Chronic systolic (congestive) heart failure: Secondary | ICD-10-CM | POA: Diagnosis not present

## 2019-04-07 DIAGNOSIS — I340 Nonrheumatic mitral (valve) insufficiency: Secondary | ICD-10-CM | POA: Insufficient documentation

## 2019-04-07 DIAGNOSIS — Q210 Ventricular septal defect: Secondary | ICD-10-CM | POA: Insufficient documentation

## 2019-04-07 DIAGNOSIS — Z7901 Long term (current) use of anticoagulants: Secondary | ICD-10-CM | POA: Diagnosis not present

## 2019-04-07 DIAGNOSIS — Z8711 Personal history of peptic ulcer disease: Secondary | ICD-10-CM | POA: Diagnosis not present

## 2019-04-07 DIAGNOSIS — Z809 Family history of malignant neoplasm, unspecified: Secondary | ICD-10-CM | POA: Insufficient documentation

## 2019-04-07 DIAGNOSIS — Z79890 Hormone replacement therapy (postmenopausal): Secondary | ICD-10-CM | POA: Insufficient documentation

## 2019-04-07 DIAGNOSIS — E039 Hypothyroidism, unspecified: Secondary | ICD-10-CM | POA: Insufficient documentation

## 2019-04-07 DIAGNOSIS — I480 Paroxysmal atrial fibrillation: Secondary | ICD-10-CM | POA: Insufficient documentation

## 2019-04-07 DIAGNOSIS — Z8249 Family history of ischemic heart disease and other diseases of the circulatory system: Secondary | ICD-10-CM | POA: Diagnosis not present

## 2019-04-07 DIAGNOSIS — Z79899 Other long term (current) drug therapy: Secondary | ICD-10-CM | POA: Diagnosis not present

## 2019-04-07 DIAGNOSIS — I428 Other cardiomyopathies: Secondary | ICD-10-CM | POA: Diagnosis not present

## 2019-04-07 DIAGNOSIS — Z885 Allergy status to narcotic agent status: Secondary | ICD-10-CM | POA: Diagnosis not present

## 2019-04-07 DIAGNOSIS — E785 Hyperlipidemia, unspecified: Secondary | ICD-10-CM | POA: Diagnosis not present

## 2019-04-07 LAB — BASIC METABOLIC PANEL
Anion gap: 12 (ref 5–15)
BUN: 56 mg/dL — ABNORMAL HIGH (ref 8–23)
CO2: 20 mmol/L — ABNORMAL LOW (ref 22–32)
Calcium: 10.1 mg/dL (ref 8.9–10.3)
Chloride: 110 mmol/L (ref 98–111)
Creatinine, Ser: 3.45 mg/dL — ABNORMAL HIGH (ref 0.44–1.00)
GFR calc Af Amer: 14 mL/min — ABNORMAL LOW (ref >60)
GFR calc non Af Amer: 12 mL/min — ABNORMAL LOW (ref >60)
Glucose, Bld: 145 mg/dL — ABNORMAL HIGH (ref 70–99)
Potassium: 4.2 mmol/L (ref 3.5–5.1)
Sodium: 142 mmol/L (ref 135–145)

## 2019-04-07 LAB — LIPID PANEL
Cholesterol: 185 mg/dL (ref 0–200)
HDL: 42 mg/dL (ref >40)
LDL Cholesterol: 112 mg/dL — ABNORMAL HIGH (ref 0–99)
Total CHOL/HDL Ratio: 4.4 RATIO
Triglycerides: 155 mg/dL — ABNORMAL HIGH (ref <150)
VLDL: 31 mg/dL (ref 0–40)

## 2019-04-07 MED ORDER — HYDRALAZINE HCL 25 MG PO TABS: 25.0000 mg | ORAL_TABLET | Freq: Three times a day (TID) | ORAL | 2 refills | Status: DC

## 2019-04-07 NOTE — Patient Instructions (Addendum)
INCREASE Hydralazine to 25mg  (1 tab) THREE TIMES A DAY  Labs today We will only contact you if something comes back abnormal or we need to make some changes. Otherwise no news is good news!  Your physician recommends that you schedule a follow-up appointment in: 2 months with Dr Aundra Dubin.  At the [facility], you and your health needs are our priority. As part of our continuing mission to provide you with exceptional heart care, we have created designated Provider Care Teams. These Care Teams include your primary Cardiologist (physician) and Advanced Practice Providers (APPs- Physician Assistants and Nurse Practitioners) who all work together to provide you with the care you need, when you need it.   You may see any of the following providers on your designated Care Team at your next follow up: Marland Kitchen Dr Glori Bickers . Dr Loralie Champagne . Darrick Grinder, NP . Lyda Jester, PA   Please be sure to bring in all your medications bottles to every appointment.

## 2019-04-07 NOTE — Progress Notes (Signed)
Advanced Heart Failure Clinic Note   Referring Physician: PCP: Elby Showers, MD PCP-Cardiologist: Ezzard Standing, MD  HF Cardiology: Dr. Aundra Dubin  HPI:  Casey [name] is a 84 y.o. female with long history of nonischemic cardiomyopathy, now CKD stage 3-4. She had cath in 5/05 with normal coronaries. She has MDT CRT-D device. Not on ACEI/ARB/ARNI due to renal dysfunction (tried all in the past). On warfarin with history of paroxysmal atrial fibrillation. Echo in 5/17 with EF 20%, moderate MR, moderate TR, small VSD.   Admitted 07/06/17 with increased SOB and decreased urine output. Echo with EF 10-15%, moderate-severe MR.  Diuresed with IV lasix and metolazone and lost 7 lbs with improvement in symptoms. Transitioned from Lasix to torsemide. Meds optimized but limited due to CKD IV.   TEE in 5/19 showed moderate central MR, not severe.   Left ankle fracture in 7/19 with fixation.   Echo 9/20 showed EF 15% with moderate LV dilation, mildly decreased RV systolic function, mild-moderate MR with normal IVC.   She returns for followup of CHF.   Weight down 4 lbs. She stopped cardiac rehab due to knee pain.  She feeling good, exercising at home now.  Breathing well, no dyspnea on flat ground.  No chest pain.  No orthopnea/PND.  Last creatinine lower (2.48).  Changed up her diet, less sodium.   Labs (3/19): K 3.9, creatinine 2.6, hgb 15.3 Labs (4/19): LFTs normal Labs (5/19): K 4.9, creatinine 2.75, TSH normal, hgb 14.8 Labs (6/20): K 4.4, creatinine 3.01 Labs (8/20): LDL 123, TGs 204 Labs (9/20): K 4.1, creatinine 2.48  [employer] device interrogation: >99% BiV pacing, no AF/VT, impedance stable.   Review of systems complete and found to be negative unless listed in HPI.    Past Medical History 1. Chronic systolic CHF: Long-standing nonischemic cardiomyopathy, 5/05 cath without significant CAD. Echo in 5/17 with EF 20%.  - Echo 1/19 with EF 10-15%, prominent dyssynchrony,  moderate-severe MR, PASP 91 (but ?if this represents VSD flow rather than TR).  - MDT CRT-D device.  - TEE (5/19): EF 15-20%, mild LV dilation, small peri-membranous VSD, moderate central MR, RV mildly dilated with normal systolic function.  - Echo (9/20): EF 15% with moderate LV dilation, mildly decreased RV systolic function, mild-moderate MR with normal IVC.  2. Atrial fibrillation: Paroxysmal. She is on amiodarone and warfarin.  3. CKD: Stage 4. Follows with nephrology (Dr. Justin Mend).  4. H/o VSD:  Small/restrictive.  5. Mitral regurgitation: Moderate-severe central MR on 1/19 echo, likely functional.   - Moderate central MR on 5/19 TEE.  6. Gout 7. Hypothyroidism 8. PUD  Current Outpatient Medications  Medication Sig Dispense Refill  . allopurinol (ZYLOPRIM) 100 MG tablet Take 1 tablet by mouth once daily 90 tablet 1  . hydrALAZINE (APRESOLINE) 25 MG tablet Take 1 tablet (25 mg total) by mouth 3 (three) times daily. 270 tablet 2  . isosorbide mononitrate (IMDUR) 30 MG 24 hr tablet Take 1 tablet by mouth daily 90 tablet 0  . levothyroxine (SYNTHROID) 75 MCG tablet TAKE 1 TABLET BY MOUTH ONCE DAILY BEFORE BREAKFAST 90 tablet 0  . metoprolol succinate (TOPROL-XL) 50 MG 24 hr tablet Take 1 tablet (50 mg total) by mouth daily. Take with or immediately following a meal. 90 tablet 1  . Multiple Vitamins-Calcium (ONE-A-DAY WOMENS FORMULA) TABS Take 1 tablet by mouth daily.      . potassium chloride SA (K-DUR,KLOR-CON) 20 MEQ tablet Take 1 tablet (20 mEq total) by mouth  daily. 90 tablet 1  . rosuvastatin (CRESTOR) 20 MG tablet Take 1 tablet (20 mg total) by mouth daily. 30 tablet 6  . torsemide (DEMADEX) 20 MG tablet Take 3 tablets by mouth once daily 90 tablet 0  . warfarin (COUMADIN) 2.5 MG tablet TAKE 1 TABLET BY MOUTH EVERY OTHER DAY 30 tablet 0   No current facility-administered medications for this encounter.     Allergies  Allergen Reactions  . Codeine Rash      Social History    Socioeconomic History  . Marital status: Married    Spouse name: Not on file  . Number of children: Not on file  . Years of education: 20  . Highest education level: 11th grade  Occupational History  . Not on file  Social Needs  . Financial resource strain: Not hard at all  . Food insecurity    Worry: Never true    Inability: Never true  . Transportation needs    Medical: No    Non-medical: No  Tobacco Use  . Smoking status: Never Smoker  . Smokeless tobacco: Never Used  Substance and Sexual Activity  . Alcohol use: No  . Drug use: No  . Sexual activity: Not Currently  Lifestyle  . Physical activity    Days per week: 0 days    Minutes per session: 0 min  . Stress: To some extent  Relationships  . Social Herbalist on phone: Not on file    Gets together: Not on file    Attends religious service: Not on file    Active member of club or organization: Not on file    Attends meetings of clubs or organizations: Not on file    Relationship status: Not on file  . Intimate partner violence    Fear of current or ex partner: Not on file    Emotionally abused: Not on file    Physically abused: Not on file    Forced sexual activity: Not on file  Other Topics Concern  . Not on file  Social History Narrative  . Not on file      Family History  Problem Relation Age of Onset  . Heart failure Mother   . Cancer Father    Vitals:   04/07/19 0948  BP: (!) 126/57  Pulse: 83  SpO2: 98%  Weight: 75.4 kg (166 lb 3.2 oz)     Wt Readings from Last 3 Encounters:  04/07/19 75.4 kg (166 lb 3.2 oz)  03/04/19 77.9 kg (171 lb 11.8 oz)  02/25/19 77.2 kg (170 lb 3.2 oz)    PHYSICAL EXAM: General: NAD Neck: No JVD, no thyromegaly or thyroid nodule.  Lungs: Clear to auscultation bilaterally with normal respiratory effort. CV: Nondisplaced PMI.  Heart regular S1/S2, no [id number], minimal systolic murmur.  No peripheral edema.  No carotid bruit.  Normal pedal pulses.  Abdomen:  Soft, nontender, no hepatosplenomegaly, no distention.  Skin: Intact without lesions or rashes.  Neurologic: Alert and oriented x 3.  Psych: Normal affect. Extremities: No clubbing or cyanosis.  HEENT: Normal.   ASSESSMENT & PLAN:  1. Chronic systolic CHF: Long-standing NICM, 5/05 cath without significant CAD. Echo in 5/17 with EF 20%, echo in 1/19 with EF 10-15%, prominent dyssynchrony, moderate-severe MR, PASP 91 (but ?if this represents VSD flow rather than TR). Complicated by CKD stage 4. Has MDT CRT-D device. In 1/19, she underwent V-V optimization per EP.  TEE in 5/19 showed moderate MR, EF 15-20%.  Echo 9/20 showed EF 15%, mildly decreased RV systolic function, mild-moderate MR. On exam and by Optivol, she is not volume overloaded.  NYHA class II, improved.  Medication titration is limited by CKD stage IV but creatinine better recently (2.48).  - Continue torsemide 60 mg daily.  BMET today.  - Continue Toprol XL 50 mg daily.   - No ACEI/ARB/ARNI: has failed in the past with AKI.  - Increase hydralazine to 25 mg tid and continue Imdur 30 daily.  - Creatinine too high for spironolactone.  2. Atrial fibrillation: Paroxysmal. Maintaining NSR.  - She is now off amiodarone, do not think that this needs to be restarted   - Continue warfarin.  3. CKD: Stage 4.  - BMET today.    - Have referred to nephrology. 4. H/o VSD: Small peri-membranous VSD on 5/19 TEE.   5. Mitral regurgitation: TEE 5/19 with moderate functional MR.  Echo in 9/20 showed mild-moderate MR.  Not candidate for Mitraclip.    6. Hyperlipidemia: Continue Crestor, check lipids today.   Followup in 2 months.    Loralie Champagne, MD 04/07/19

## 2019-04-08 ENCOUNTER — Telehealth (HOSPITAL_COMMUNITY): Payer: Self-pay

## 2019-04-08 DIAGNOSIS — I5022 Chronic systolic (congestive) heart failure: Secondary | ICD-10-CM

## 2019-04-08 MED ORDER — TORSEMIDE 20 MG PO TABS: 40.0000 mg | ORAL_TABLET | Freq: Every day | ORAL | 5 refills | Status: DC

## 2019-04-08 NOTE — Telephone Encounter (Signed)
-----   Message from Larey Dresser, MD sent at 04/07/2019  5:31 PM EDT ----- Creatinine higher, drop torsemide to 40 mg daily and get BMET in 10 days.  Keep fluid/sodium intake very low.

## 2019-04-08 NOTE — Telephone Encounter (Signed)
Pt aware of lab results.  Pt aware to maintain low sodium and free fluid diet. Pt to decrease torsemide to 40mg  daily.  Pt verbalized understanding.

## 2019-04-09 ENCOUNTER — Encounter (HOSPITAL_COMMUNITY): Payer: Medicare Other

## 2019-04-09 ENCOUNTER — Ambulatory Visit (HOSPITAL_COMMUNITY): Payer: Medicare Other

## 2019-04-11 ENCOUNTER — Ambulatory Visit (HOSPITAL_COMMUNITY): Payer: Medicare Other

## 2019-04-11 ENCOUNTER — Encounter (HOSPITAL_COMMUNITY): Payer: Medicare Other

## 2019-04-11 ENCOUNTER — Other Ambulatory Visit (HOSPITAL_COMMUNITY): Payer: Self-pay | Admitting: Internal Medicine

## 2019-04-11 ENCOUNTER — Other Ambulatory Visit: Payer: Self-pay | Admitting: Internal Medicine

## 2019-04-11 DIAGNOSIS — I5022 Chronic systolic (congestive) heart failure: Secondary | ICD-10-CM

## 2019-04-14 ENCOUNTER — Encounter (HOSPITAL_COMMUNITY): Payer: Medicare Other

## 2019-04-14 ENCOUNTER — Ambulatory Visit (HOSPITAL_COMMUNITY): Payer: Medicare Other

## 2019-04-14 ENCOUNTER — Ambulatory Visit (HOSPITAL_COMMUNITY)
Admission: RE | Admit: 2019-04-14 | Discharge: 2019-04-14 | Disposition: A | Discharge status: Home / Self Care | Payer: Medicare Other | Source: Ambulatory Visit | Attending: Internal Medicine | Admitting: Internal Medicine

## 2019-04-14 ENCOUNTER — Other Ambulatory Visit: Payer: Self-pay

## 2019-04-14 DIAGNOSIS — H903 Sensorineural hearing loss, bilateral: Secondary | ICD-10-CM | POA: Diagnosis not present

## 2019-04-14 DIAGNOSIS — I5022 Chronic systolic (congestive) heart failure: Secondary | ICD-10-CM

## 2019-04-14 LAB — BASIC METABOLIC PANEL
Anion gap: 11 (ref 5–15)
BUN: 33 mg/dL — ABNORMAL HIGH (ref 8–23)
CO2: 18 mmol/L — ABNORMAL LOW (ref 22–32)
Calcium: 10.1 mg/dL (ref 8.9–10.3)
Chloride: 113 mmol/L — ABNORMAL HIGH (ref 98–111)
Creatinine, Ser: 3.36 mg/dL — ABNORMAL HIGH (ref 0.44–1.00)
GFR calc Af Amer: 14 mL/min — ABNORMAL LOW (ref >60)
GFR calc non Af Amer: 12 mL/min — ABNORMAL LOW (ref >60)
Glucose, Bld: 120 mg/dL — ABNORMAL HIGH (ref 70–99)
Potassium: 4.5 mmol/L (ref 3.5–5.1)
Sodium: 142 mmol/L (ref 135–145)

## 2019-04-16 ENCOUNTER — Encounter (HOSPITAL_COMMUNITY): Payer: Medicare Other

## 2019-04-16 ENCOUNTER — Ambulatory Visit (HOSPITAL_COMMUNITY): Payer: Medicare Other

## 2019-04-17 NOTE — Progress Notes (Signed)
Remote ICD transmission.   

## 2019-04-18 ENCOUNTER — Ambulatory Visit (HOSPITAL_COMMUNITY): Payer: Medicare Other

## 2019-04-18 ENCOUNTER — Encounter (HOSPITAL_COMMUNITY): Payer: Medicare Other

## 2019-04-18 ENCOUNTER — Other Ambulatory Visit (HOSPITAL_COMMUNITY): Payer: Medicare Other

## 2019-04-21 ENCOUNTER — Ambulatory Visit (HOSPITAL_COMMUNITY): Payer: Medicare Other

## 2019-04-21 ENCOUNTER — Encounter (HOSPITAL_COMMUNITY): Payer: Medicare Other

## 2019-04-23 ENCOUNTER — Encounter (HOSPITAL_COMMUNITY): Payer: Medicare Other

## 2019-04-23 ENCOUNTER — Ambulatory Visit (HOSPITAL_COMMUNITY): Payer: Medicare Other

## 2019-04-25 ENCOUNTER — Ambulatory Visit (HOSPITAL_COMMUNITY): Payer: Medicare Other

## 2019-04-25 ENCOUNTER — Encounter (HOSPITAL_COMMUNITY): Payer: Medicare Other

## 2019-04-28 ENCOUNTER — Encounter (HOSPITAL_COMMUNITY): Payer: Medicare Other

## 2019-04-28 ENCOUNTER — Ambulatory Visit (HOSPITAL_COMMUNITY)
Admission: RE | Admit: 2019-04-28 | Discharge: 2019-04-28 | Disposition: A | Discharge status: Home / Self Care | Payer: Medicare Other | Source: Ambulatory Visit | Attending: Internal Medicine | Admitting: Internal Medicine

## 2019-04-28 ENCOUNTER — Other Ambulatory Visit: Payer: Self-pay

## 2019-04-28 ENCOUNTER — Ambulatory Visit (HOSPITAL_COMMUNITY): Payer: Medicare Other

## 2019-04-28 DIAGNOSIS — I4891 Unspecified atrial fibrillation: Secondary | ICD-10-CM | POA: Diagnosis not present

## 2019-04-28 DIAGNOSIS — M4800 Spinal stenosis, site unspecified: Secondary | ICD-10-CM | POA: Diagnosis not present

## 2019-04-28 DIAGNOSIS — I428 Other cardiomyopathies: Secondary | ICD-10-CM | POA: Diagnosis not present

## 2019-04-28 DIAGNOSIS — E039 Hypothyroidism, unspecified: Secondary | ICD-10-CM | POA: Diagnosis not present

## 2019-04-28 DIAGNOSIS — N2581 Secondary hyperparathyroidism of renal origin: Secondary | ICD-10-CM | POA: Diagnosis not present

## 2019-04-28 DIAGNOSIS — N185 Chronic kidney disease, stage 5: Secondary | ICD-10-CM | POA: Diagnosis not present

## 2019-04-28 DIAGNOSIS — E785 Hyperlipidemia, unspecified: Secondary | ICD-10-CM | POA: Diagnosis not present

## 2019-04-28 DIAGNOSIS — Z95810 Presence of automatic (implantable) cardiac defibrillator: Secondary | ICD-10-CM | POA: Diagnosis not present

## 2019-04-28 DIAGNOSIS — D631 Anemia in chronic kidney disease: Secondary | ICD-10-CM | POA: Diagnosis not present

## 2019-04-28 DIAGNOSIS — Q600 Renal agenesis, unilateral: Secondary | ICD-10-CM | POA: Diagnosis not present

## 2019-04-28 LAB — LIPID PANEL
Cholesterol: 159 mg/dL (ref 0–200)
HDL: 43 mg/dL (ref >40)
LDL Cholesterol: 88 mg/dL (ref 0–99)
Total CHOL/HDL Ratio: 3.7 RATIO
Triglycerides: 140 mg/dL (ref <150)
VLDL: 28 mg/dL (ref 0–40)

## 2019-04-28 LAB — HEPATIC FUNCTION PANEL
ALT: 13 U/L (ref 0–44)
AST: 30 U/L (ref 15–41)
Albumin: 4 g/dL (ref 3.5–5.0)
Alkaline Phosphatase: 148 U/L — ABNORMAL HIGH (ref 38–126)
Bilirubin, Direct: 0.3 mg/dL — ABNORMAL HIGH (ref 0.0–0.2)
Indirect Bilirubin: 1.5 mg/dL — ABNORMAL HIGH (ref 0.3–0.9)
Total Bilirubin: 1.8 mg/dL — ABNORMAL HIGH (ref 0.3–1.2)
Total Protein: 7.1 g/dL (ref 6.5–8.1)

## 2019-04-30 ENCOUNTER — Other Ambulatory Visit: Payer: Self-pay | Admitting: Cardiology

## 2019-04-30 ENCOUNTER — Ambulatory Visit (HOSPITAL_COMMUNITY): Payer: Medicare Other

## 2019-04-30 ENCOUNTER — Encounter (HOSPITAL_COMMUNITY): Payer: Medicare Other

## 2019-05-02 ENCOUNTER — Ambulatory Visit (HOSPITAL_COMMUNITY): Payer: Medicare Other

## 2019-05-02 ENCOUNTER — Encounter (HOSPITAL_COMMUNITY): Payer: Medicare Other

## 2019-05-15 ENCOUNTER — Telehealth (HOSPITAL_COMMUNITY): Payer: Self-pay

## 2019-05-15 NOTE — Telephone Encounter (Signed)
Pts daughter called to report patient has been moving slower than usual, complains of left leg stiffness and left hand numbness.  She denies any heart failure symptoms including chest pain, shortness of breath, or swelling.  She denies headache or dizziness.  She reports its been occurring for 1 week. She is asking for an appointment. I advised this does not seem like HF symptoms in nature and she would benefit from an evaluation from a general practitioner and go to ED for symptoms.  Verbalized understanding.

## 2019-05-26 ENCOUNTER — Other Ambulatory Visit (HOSPITAL_COMMUNITY): Payer: Self-pay | Admitting: Cardiology

## 2019-05-26 DIAGNOSIS — I5022 Chronic systolic (congestive) heart failure: Secondary | ICD-10-CM

## 2019-06-03 ENCOUNTER — Other Ambulatory Visit (HOSPITAL_COMMUNITY): Payer: Self-pay

## 2019-06-03 DIAGNOSIS — I5022 Chronic systolic (congestive) heart failure: Secondary | ICD-10-CM

## 2019-06-03 DIAGNOSIS — H8112 Benign paroxysmal vertigo, left ear: Secondary | ICD-10-CM

## 2019-06-03 MED ORDER — METOPROLOL SUCCINATE ER 50 MG PO TB24: 50.0000 mg | ORAL_TABLET | Freq: Every day | ORAL | 3 refills | Status: DC

## 2019-06-16 ENCOUNTER — Ambulatory Visit (HOSPITAL_COMMUNITY)
Admission: RE | Admit: 2019-06-16 | Discharge: 2019-06-16 | Disposition: A | Discharge status: Home / Self Care | Payer: Medicare Other | Source: Ambulatory Visit | Attending: Cardiology | Admitting: Cardiology

## 2019-06-16 ENCOUNTER — Encounter (HOSPITAL_COMMUNITY): Payer: Self-pay | Admitting: Cardiology

## 2019-06-16 ENCOUNTER — Other Ambulatory Visit: Payer: Self-pay

## 2019-06-16 DIAGNOSIS — R9431 Abnormal electrocardiogram [ECG] [EKG]: Secondary | ICD-10-CM | POA: Insufficient documentation

## 2019-06-16 DIAGNOSIS — I340 Nonrheumatic mitral (valve) insufficiency: Secondary | ICD-10-CM | POA: Diagnosis not present

## 2019-06-16 DIAGNOSIS — E785 Hyperlipidemia, unspecified: Secondary | ICD-10-CM | POA: Insufficient documentation

## 2019-06-16 DIAGNOSIS — Z79890 Hormone replacement therapy (postmenopausal): Secondary | ICD-10-CM | POA: Insufficient documentation

## 2019-06-16 DIAGNOSIS — M109 Gout, unspecified: Secondary | ICD-10-CM | POA: Diagnosis not present

## 2019-06-16 DIAGNOSIS — Z7901 Long term (current) use of anticoagulants: Secondary | ICD-10-CM | POA: Diagnosis not present

## 2019-06-16 DIAGNOSIS — H8112 Benign paroxysmal vertigo, left ear: Secondary | ICD-10-CM

## 2019-06-16 DIAGNOSIS — N184 Chronic kidney disease, stage 4 (severe): Secondary | ICD-10-CM | POA: Diagnosis not present

## 2019-06-16 DIAGNOSIS — I5022 Chronic systolic (congestive) heart failure: Secondary | ICD-10-CM | POA: Insufficient documentation

## 2019-06-16 DIAGNOSIS — Z8711 Personal history of peptic ulcer disease: Secondary | ICD-10-CM | POA: Insufficient documentation

## 2019-06-16 DIAGNOSIS — E039 Hypothyroidism, unspecified: Secondary | ICD-10-CM | POA: Diagnosis not present

## 2019-06-16 DIAGNOSIS — I480 Paroxysmal atrial fibrillation: Secondary | ICD-10-CM | POA: Diagnosis not present

## 2019-06-16 DIAGNOSIS — I428 Other cardiomyopathies: Secondary | ICD-10-CM | POA: Diagnosis not present

## 2019-06-16 DIAGNOSIS — Z79899 Other long term (current) drug therapy: Secondary | ICD-10-CM | POA: Diagnosis not present

## 2019-06-16 LAB — BASIC METABOLIC PANEL
Anion gap: 13 (ref 5–15)
BUN: 40 mg/dL — ABNORMAL HIGH (ref 8–23)
CO2: 22 mmol/L (ref 22–32)
Calcium: 10.2 mg/dL (ref 8.9–10.3)
Chloride: 108 mmol/L (ref 98–111)
Creatinine, Ser: 2.47 mg/dL — ABNORMAL HIGH (ref 0.44–1.00)
GFR calc Af Amer: 20 mL/min — ABNORMAL LOW (ref >60)
GFR calc non Af Amer: 18 mL/min — ABNORMAL LOW (ref >60)
Glucose, Bld: 138 mg/dL — ABNORMAL HIGH (ref 70–99)
Potassium: 4.5 mmol/L (ref 3.5–5.1)
Sodium: 143 mmol/L (ref 135–145)

## 2019-06-16 MED ORDER — METOPROLOL SUCCINATE ER 25 MG PO TB24: 75.0000 mg | ORAL_TABLET | Freq: Every day | ORAL | 5 refills | Status: DC

## 2019-06-16 NOTE — Patient Instructions (Signed)
Continue taking Torsemide 40mg  ( 2 tabs) daily  INCREASE Toprol XL to 75mg  (3 tabs) daily.  A new prescription has been sent with 25mg  tablets. You should take 3 daily  Office will follow up with [place] regarding your referral.  They will call you to schedule this appointment.  Your physician recommends that you schedule a follow-up appointment in: 2 months with Dr Aundra Dubin.  Please call office at [phone] option 2 if you have any questions or concerns.   At the [facility], you and your health needs are our priority. As part of our continuing mission to provide you with exceptional heart care, we have created designated Provider Care Teams. These Care Teams include your primary Cardiologist (physician) and Advanced Practice Providers (APPs- Physician Assistants and Nurse Practitioners) who all work together to provide you with the care you need, when you need it.   You may see any of the following providers on your designated Care Team at your next follow up: Marland Kitchen Dr Glori Bickers . Dr Loralie Champagne . Darrick Grinder, NP . Lyda Jester, PA . Audry Riles, PharmD   Please be sure to bring in all your medications bottles to every appointment.

## 2019-06-16 NOTE — Progress Notes (Signed)
Advanced Heart Failure Clinic Note   Referring Physician: PCP: Elby Showers, MD PCP-Cardiologist: Ezzard Standing, MD  HF Cardiology: Dr. Aundra Dubin  HPI:  Casey [name] is a 85 y.o. female with long history of nonischemic cardiomyopathy, now CKD stage 3-4. She had cath in 5/05 with normal coronaries. She has MDT CRT-D device. Not on ACEI/ARB/ARNI due to renal dysfunction (tried all in the past). On warfarin with history of paroxysmal atrial fibrillation. Echo in 5/17 with EF 20%, moderate MR, moderate TR, small VSD.   Admitted 07/06/17 with increased SOB and decreased urine output. Echo with EF 10-15%, moderate-severe MR.  Diuresed with IV lasix and metolazone and lost 7 lbs with improvement in symptoms. Transitioned from Lasix to torsemide. Meds optimized but limited due to CKD IV.   TEE in 5/19 showed moderate central MR, not severe.   Left ankle fracture in 7/19 with fixation.   Echo 9/20 showed EF 15% with moderate LV dilation, mildly decreased RV systolic function, mild-moderate MR with normal IVC.   She returns for followup of CHF.   Weight down 1 lb.  Seems stable symptomatically, main complaint is knee pain from arthritis. No dyspnea walking on flat ground.  No chest pain.  No lightheadedness.  No orthopnea/PND.  Overall, feeling pretty good though feels tired from time to time.   ECG (personally reviewed): NSR, BiV pacing  Labs (3/19): K 3.9, creatinine 2.6, hgb 15.3 Labs (4/19): LFTs normal Labs (5/19): K 4.9, creatinine 2.75, TSH normal, hgb 14.8 Labs (6/20): K 4.4, creatinine 3.01 Labs (8/20): LDL 123, TGs 204 Labs (9/20): K 4.1, creatinine 2.48 Labs (11/20): LDL 88, K 4.5, creatinine 3.36  Review of systems complete and found to be negative unless listed in HPI.    Past Medical History 1. Chronic systolic CHF: Long-standing nonischemic cardiomyopathy, 5/05 cath without significant CAD. Echo in 5/17 with EF 20%.  - Echo 1/19 with EF 10-15%, prominent  dyssynchrony, moderate-severe MR, PASP 91 (but ?if this represents VSD flow rather than TR).  - MDT CRT-D device.  - TEE (5/19): EF 15-20%, mild LV dilation, small peri-membranous VSD, moderate central MR, RV mildly dilated with normal systolic function.  - Echo (9/20): EF 15% with moderate LV dilation, mildly decreased RV systolic function, mild-moderate MR with normal IVC.  2. Atrial fibrillation: Paroxysmal. She is on amiodarone and warfarin.  3. CKD: Stage 4. Follows with nephrology (Dr. Justin Mend).  4. H/o VSD:  Small/restrictive.  5. Mitral regurgitation: Moderate-severe central MR on 1/19 echo, likely functional.   - Moderate central MR on 5/19 TEE.  6. Gout 7. Hypothyroidism 8. PUD  Current Outpatient Medications  Medication Sig Dispense Refill  . torsemide (DEMADEX) 20 MG tablet Take 40 mg by mouth daily.    Marland Kitchen allopurinol (ZYLOPRIM) 100 MG tablet Take 1 tablet by mouth once daily 90 tablet 1  . hydrALAZINE (APRESOLINE) 25 MG tablet Take 1 tablet (25 mg total) by mouth 3 (three) times daily. 270 tablet 2  . isosorbide mononitrate (IMDUR) 30 MG 24 hr tablet Take 1 tablet by mouth daily 90 tablet 0  . levothyroxine (SYNTHROID) 75 MCG tablet TAKE 1 TABLET BY MOUTH ONCE DAILY BEFORE BREAKFAST 90 tablet 0  . metoprolol succinate (TOPROL-XL) 25 MG 24 hr tablet Take 3 tablets (75 mg total) by mouth daily. Take with or immediately following a meal. 90 tablet 5  . Multiple Vitamins-Calcium (ONE-A-DAY WOMENS FORMULA) TABS Take 1 tablet by mouth daily.      . potassium  chloride SA (K-DUR,KLOR-CON) 20 MEQ tablet Take 1 tablet (20 mEq total) by mouth daily. 90 tablet 1  . rosuvastatin (CRESTOR) 20 MG tablet Take 1 tablet (20 mg total) by mouth daily. 30 tablet 6  . warfarin (COUMADIN) 2.5 MG tablet TAKE 1 TABLET BY MOUTH EVERY OTHER DAY 90 tablet 0   No current facility-administered medications for this encounter.    Allergies  Allergen Reactions  . Codeine Rash      Social History    Socioeconomic History  . Marital status: Married    Spouse name: Not on file  . Number of children: Not on file  . Years of education: 83  . Highest education level: 11th grade  Occupational History  . Not on file  Tobacco Use  . Smoking status: Never Smoker  . Smokeless tobacco: Never Used  Substance and Sexual Activity  . Alcohol use: No  . Drug use: No  . Sexual activity: Not Currently  Other Topics Concern  . Not on file  Social History Narrative  . Not on file   Social Determinants of Health   Financial Resource Strain: Low Risk   . Difficulty of Paying Living Expenses: Not hard at all  Food Insecurity: No Food Insecurity  . Worried About Charity fundraiser in the Last Year: Never true  . Ran Out of Food in the Last Year: Never true  Transportation Needs: No Transportation Needs  . Lack of Transportation (Medical): No  . Lack of Transportation (Non-Medical): No  Physical Activity: Inactive  . Days of Exercise per Week: 0 days  . Minutes of Exercise per Session: 0 min  Stress: Stress Concern Present  . Feeling of Stress : To some extent  Social Connections:   . Frequency of Communication with Friends and Family: Not on file  . Frequency of Social Gatherings with Friends and Family: Not on file  . Attends Religious Services: Not on file  . Active Member of Clubs or Organizations: Not on file  . Attends Archivist Meetings: Not on file  . Marital Status: Not on file  Intimate Partner Violence:   . Fear of Current or Ex-Partner: Not on file  . Emotionally Abused: Not on file  . Physically Abused: Not on file  . Sexually Abused: Not on file      Family History  Problem Relation Age of Onset  . Heart failure Mother   . Cancer Father    Vitals:   06/16/19 0946  BP: 118/82  Pulse: 81  SpO2: 95%  Weight: 74.9 kg (165 lb 3.2 oz)     Wt Readings from Last 3 Encounters:  06/16/19 74.9 kg (165 lb 3.2 oz)  04/07/19 75.4 kg (166 lb 3.2 oz)   03/04/19 77.9 kg (171 lb 11.8 oz)    PHYSICAL EXAM: General: NAD Neck: No JVD, no thyromegaly or thyroid nodule.  Lungs: Clear to auscultation bilaterally with normal respiratory effort. CV: Lateral PMI.  Heart regular S1/S2, no S3/S4, no murmur.  2/6 HSM apex.  Abdomen: Soft, nontender, no hepatosplenomegaly, no distention.  Skin: Intact without lesions or rashes.  Neurologic: Alert and oriented x 3.  Psych: Normal affect. Extremities: No clubbing or cyanosis.  HEENT: Normal.   ASSESSMENT & PLAN:  1. Chronic systolic CHF: Long-standing NICM, 5/05 cath without significant CAD. Echo in 5/17 with EF 20%, echo in 1/19 with EF 10-15%, prominent dyssynchrony, moderate-severe MR, PASP 91 (but ?if this represents VSD flow rather than TR). Complicated by  CKD stage 4. Has MDT CRT-D device. In 1/19, she underwent V-V optimization per EP.  TEE in 5/19 showed moderate MR, EF 15-20%.  Echo 9/20 showed EF 15%, mildly decreased RV systolic function, mild-moderate MR. On exam, she is not volume overloaded. Weight stable.  NYHA class II, improved.  Medication titration is limited by CKD stage IV.  - Continue torsemide 40 mg daily.  BMET today.  - Increase Toprol XL to 75 mg daily.   - No ACEI/ARB/ARNI: has failed in the past with AKI.  - Continue hydralazine 25 mg tid and continue Imdur 30 daily.  - Creatinine too high for spironolactone.  2. Atrial fibrillation: Paroxysmal. Maintaining NSR.  - She is now off amiodarone, do not think that this needs to be restarted   - Continue warfarin.  3. CKD: Stage 4.  - BMET today.    - Have referred to nephrology but no appt yet.  Referred back in 9/20 so will look to see what is going on with this.  4. H/o VSD: Small peri-membranous VSD on 5/19 TEE.   5. Mitral regurgitation: TEE 5/19 with moderate functional MR.  Echo in 9/20 showed mild-moderate MR.  Not candidate for Mitraclip.    6. Hyperlipidemia: Continue Crestor, lipids acceptable in 11/20.     Followup in 2 months.    Loralie Champagne, MD 06/16/19

## 2019-06-18 ENCOUNTER — Telehealth (HOSPITAL_COMMUNITY): Payer: Self-pay

## 2019-06-18 NOTE — Telephone Encounter (Signed)
Pt in office on 06/16/19 to see MD. Pt reported at visit that she have not heard anything back from [place] to schedule an appointment.  Called [facility] this morning, office staff reports patient was seen in November.  She will fax office note from visit. Will update once received.

## 2019-06-19 NOTE — Telephone Encounter (Signed)
After second call to [facility], office notes received and placed on MD desk.

## 2019-07-01 ENCOUNTER — Ambulatory Visit (INDEPENDENT_AMBULATORY_CARE_PROVIDER_SITE_OTHER): Payer: Medicare Other | Admitting: *Deleted

## 2019-07-01 ENCOUNTER — Other Ambulatory Visit: Payer: Self-pay | Admitting: Internal Medicine

## 2019-07-01 DIAGNOSIS — I428 Other cardiomyopathies: Secondary | ICD-10-CM | POA: Diagnosis not present

## 2019-07-01 DIAGNOSIS — E039 Hypothyroidism, unspecified: Secondary | ICD-10-CM

## 2019-07-01 LAB — CUP PACEART REMOTE DEVICE CHECK
Battery Remaining Longevity: 5 mo
Battery Voltage: 2.82 V
Brady Statistic AP VP Percent: 2.84 %
Brady Statistic AP VS Percent: 0.02 %
Brady Statistic AS VP Percent: 96.64 %
Brady Statistic AS VS Percent: 0.5 %
Brady Statistic RA Percent Paced: 2.83 %
Brady Statistic RV Percent Paced: 98.46 %
Date Time Interrogation Session: 20210119012404
HighPow Impedance: 42 Ohm
HighPow Impedance: 48 Ohm
Implantable Lead Implant Date: 20050525
Implantable Lead Implant Date: 20050525
Implantable Lead Implant Date: 20071119
Implantable Lead Location: 753858
Implantable Lead Location: 753859
Implantable Lead Location: 753860
Implantable Lead Model: 4193
Implantable Lead Model: 5076
Implantable Lead Model: 6947
Implantable Pulse Generator Implant Date: 20141105
Lead Channel Impedance Value: 285 Ohm
Lead Channel Impedance Value: 361 Ohm
Lead Channel Impedance Value: 4047 Ohm
Lead Channel Impedance Value: 4047 Ohm
Lead Channel Impedance Value: 475 Ohm
Lead Channel Impedance Value: 551 Ohm
Lead Channel Pacing Threshold Amplitude: 0.5 V
Lead Channel Pacing Threshold Amplitude: 1.25 V
Lead Channel Pacing Threshold Amplitude: 2.5 V
Lead Channel Pacing Threshold Pulse Width: 0.4 ms
Lead Channel Pacing Threshold Pulse Width: 0.4 ms
Lead Channel Pacing Threshold Pulse Width: 0.6 ms
Lead Channel Sensing Intrinsic Amplitude: 2.375 mV
Lead Channel Sensing Intrinsic Amplitude: 2.375 mV
Lead Channel Sensing Intrinsic Amplitude: 2.875 mV
Lead Channel Sensing Intrinsic Amplitude: 2.875 mV
Lead Channel Setting Pacing Amplitude: 2 V
Lead Channel Setting Pacing Amplitude: 2 V
Lead Channel Setting Pacing Amplitude: 3.5 V
Lead Channel Setting Pacing Pulse Width: 0.4 ms
Lead Channel Setting Pacing Pulse Width: 0.6 ms
Lead Channel Setting Sensing Sensitivity: 0.3 mV

## 2019-07-02 DIAGNOSIS — E039 Hypothyroidism, unspecified: Secondary | ICD-10-CM

## 2019-07-03 IMAGING — DX DG CHEST 2V
2 series · 2 of 2 positions shown · non-contrast
Comparison: Radiographs April 15, 2015.

CLINICAL DATA: Shortness of breath.

EXAM:
CHEST  2 VIEW

[dg chest 2 view (1 of 2)]
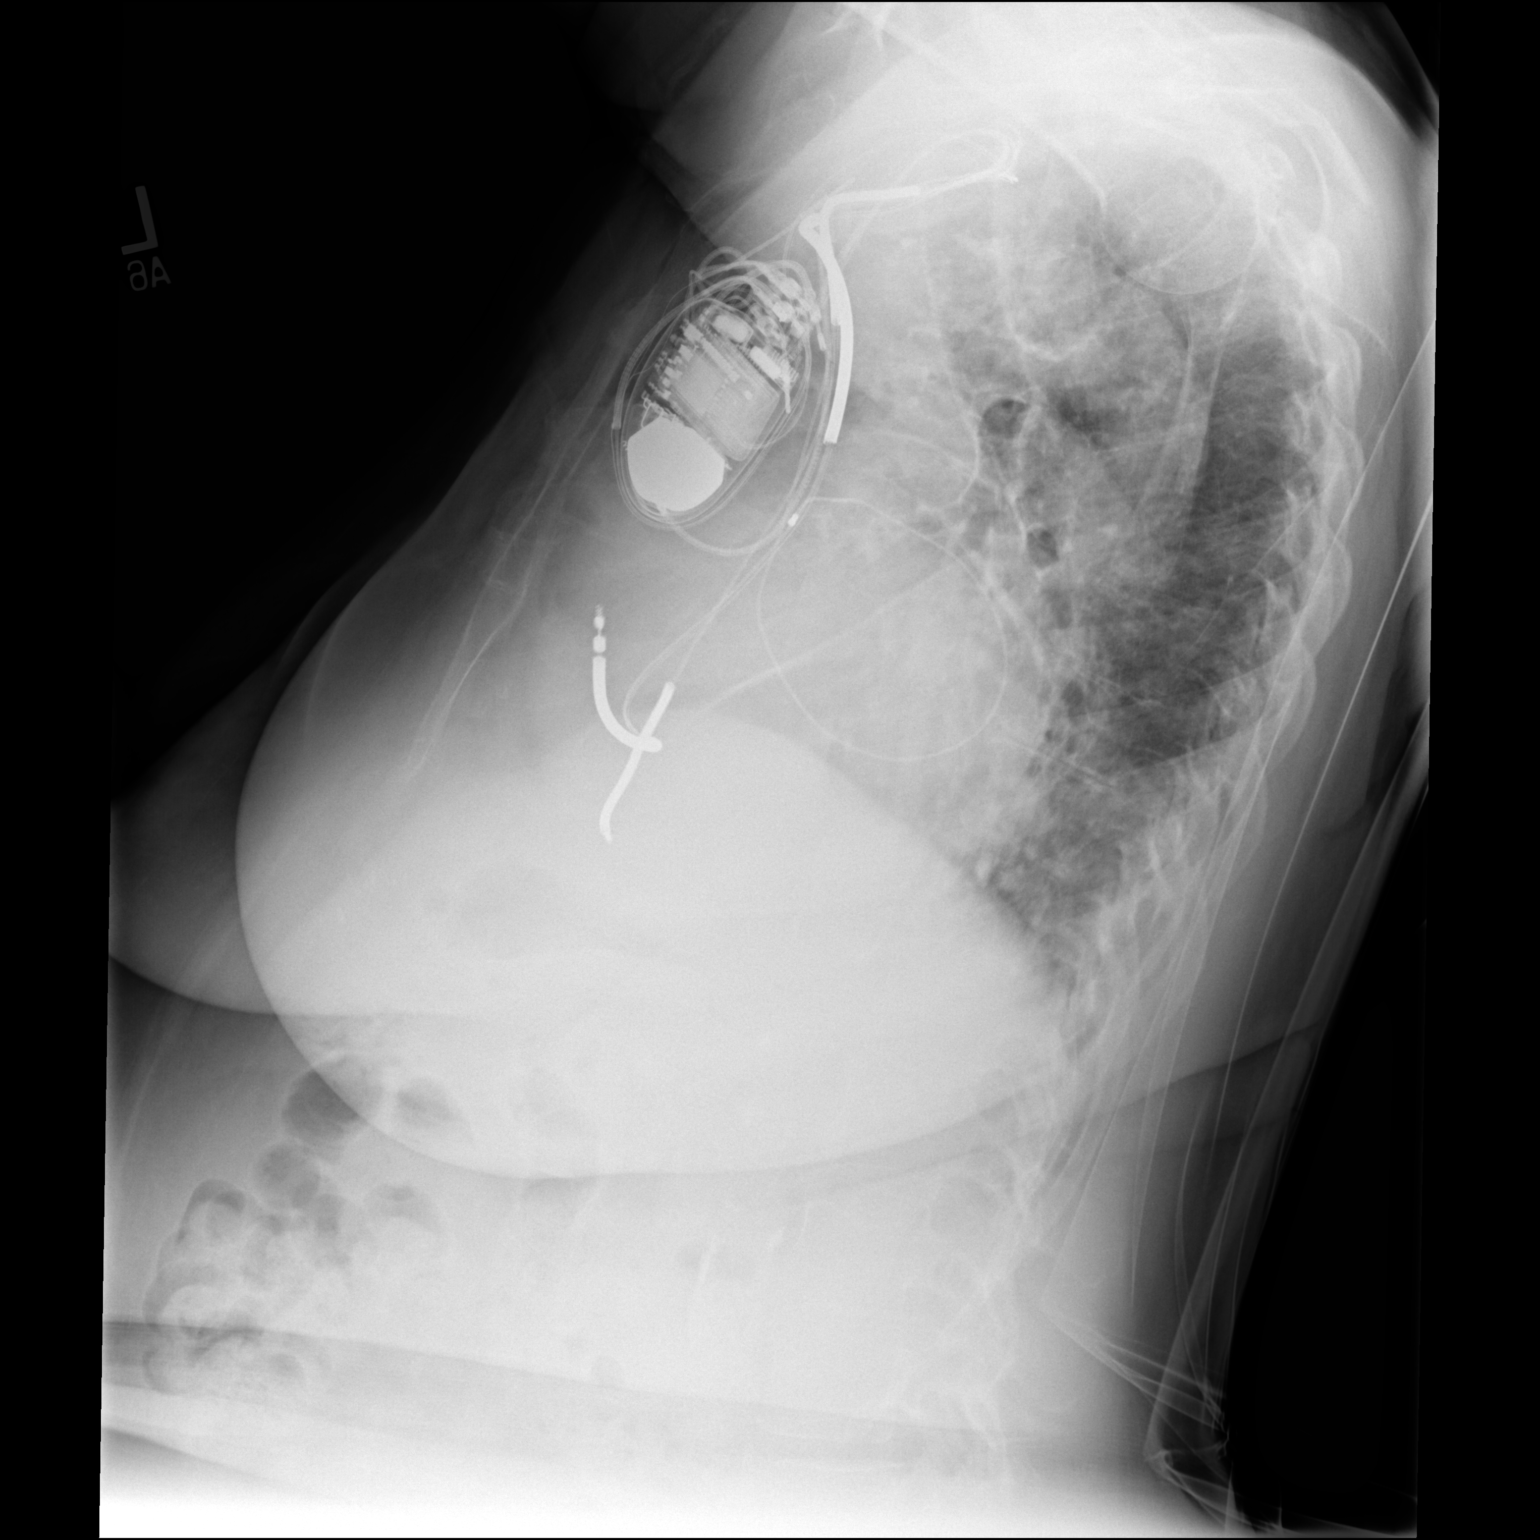

[dg chest 2 view (2 of 2)]
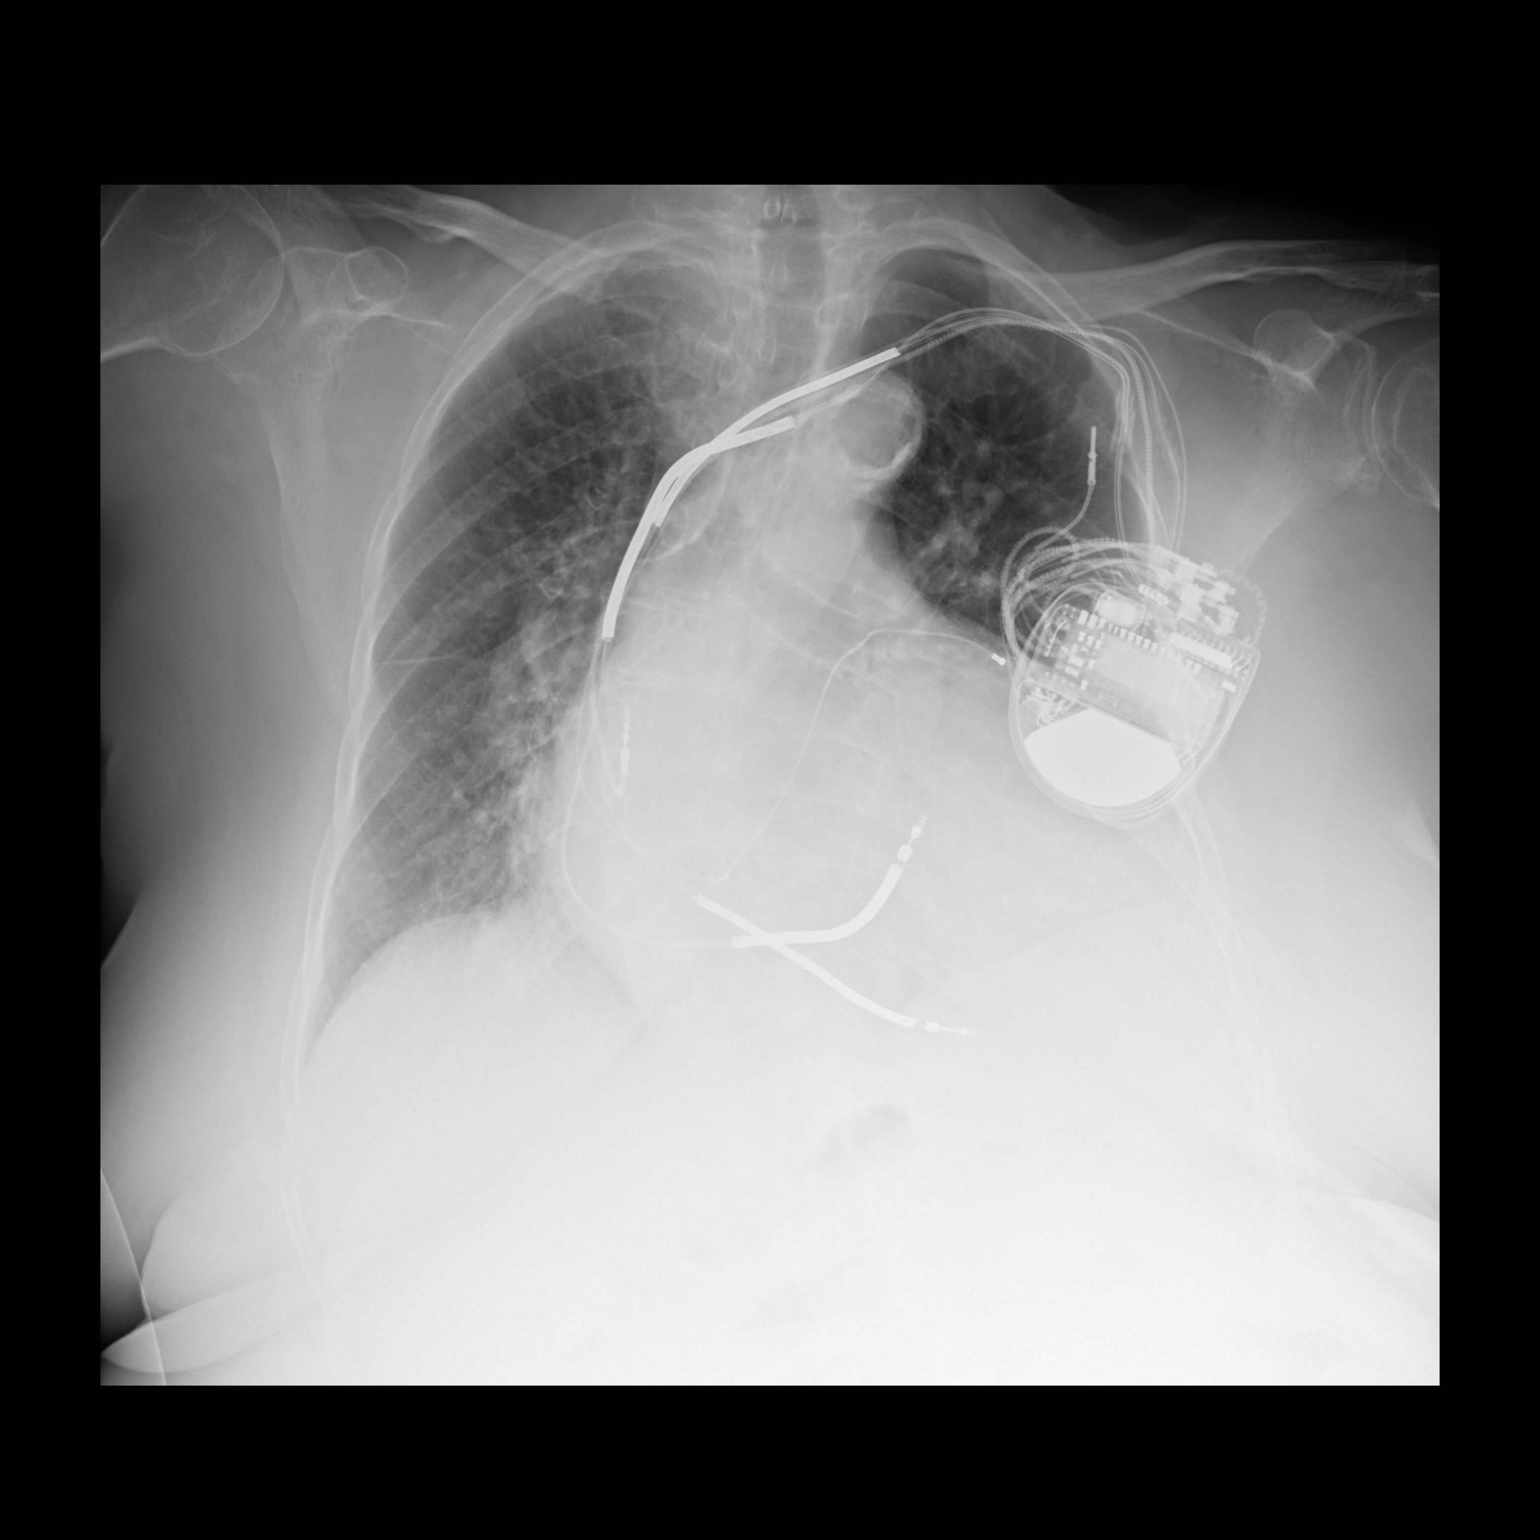

[2 of 2 positions shown; findings below may reference images not displayed]

FINDINGS: Stable cardiomegaly. Atherosclerosis of thoracic aorta is noted.
Left-sided pacemaker is unchanged in position. No pneumothorax or
pleural effusion is noted. No acute pulmonary abnormality is noted.
Bony thorax is unremarkable.
IMPRESSION: Stable cardiomegaly. Aortic atherosclerosis. No acute
cardiopulmonary abnormality seen.

## 2019-07-07 ENCOUNTER — Other Ambulatory Visit (HOSPITAL_COMMUNITY): Payer: Self-pay | Admitting: Cardiology

## 2019-07-08 ENCOUNTER — Telehealth (HOSPITAL_COMMUNITY): Payer: Self-pay | Admitting: *Deleted

## 2019-07-08 DIAGNOSIS — R0902 Hypoxemia: Secondary | ICD-10-CM | POA: Diagnosis not present

## 2019-07-08 NOTE — Telephone Encounter (Signed)
Received multiple messages from patients caregivers that patient was having heart burn. I called patient back to get more information patient denied chest pain or any CHF symptoms. No shortness of breath, edema, but she c/o a burning sensation that last for a few minutes and goes away. Pt said she contacted her PCP as well. I told her to follow up with her PCP but if she developed any cardiac symptoms to call our office back. Pt agreeable and thanked me for the call.

## 2019-08-05 ENCOUNTER — Other Ambulatory Visit (HOSPITAL_COMMUNITY): Payer: Self-pay | Admitting: Cardiology

## 2019-08-07 ENCOUNTER — Other Ambulatory Visit (HOSPITAL_COMMUNITY): Payer: Self-pay

## 2019-08-07 DIAGNOSIS — I700 Atherosclerosis of aorta: Secondary | ICD-10-CM

## 2019-08-07 MED ORDER — ISOSORBIDE MONONITRATE ER 30 MG PO TB24: 30.0000 mg | ORAL_TABLET | Freq: Every day | ORAL | 3 refills | Status: DC

## 2019-08-07 MED ORDER — POTASSIUM CHLORIDE CRYS ER 20 MEQ PO TBCR: 20.0000 meq | EXTENDED_RELEASE_TABLET | Freq: Every day | ORAL | 3 refills | Status: DC

## 2019-08-07 NOTE — Addendum Note (Signed)
Addended by: Valeda Malm on: 08/07/2019 10:19 AM   Modules accepted: Orders

## 2019-08-11 DIAGNOSIS — E039 Hypothyroidism, unspecified: Secondary | ICD-10-CM | POA: Diagnosis not present

## 2019-08-11 DIAGNOSIS — I4891 Unspecified atrial fibrillation: Secondary | ICD-10-CM | POA: Diagnosis not present

## 2019-08-11 DIAGNOSIS — N185 Chronic kidney disease, stage 5: Secondary | ICD-10-CM | POA: Diagnosis not present

## 2019-08-11 DIAGNOSIS — Q600 Renal agenesis, unilateral: Secondary | ICD-10-CM | POA: Diagnosis not present

## 2019-08-11 DIAGNOSIS — Z95810 Presence of automatic (implantable) cardiac defibrillator: Secondary | ICD-10-CM | POA: Diagnosis not present

## 2019-08-11 DIAGNOSIS — M4800 Spinal stenosis, site unspecified: Secondary | ICD-10-CM | POA: Diagnosis not present

## 2019-08-11 DIAGNOSIS — N2581 Secondary hyperparathyroidism of renal origin: Secondary | ICD-10-CM | POA: Diagnosis not present

## 2019-08-11 DIAGNOSIS — D631 Anemia in chronic kidney disease: Secondary | ICD-10-CM | POA: Diagnosis not present

## 2019-08-11 DIAGNOSIS — I428 Other cardiomyopathies: Secondary | ICD-10-CM | POA: Diagnosis not present

## 2019-08-18 ENCOUNTER — Other Ambulatory Visit: Payer: Self-pay

## 2019-08-18 ENCOUNTER — Encounter (HOSPITAL_COMMUNITY): Payer: Self-pay | Admitting: Cardiology

## 2019-08-18 ENCOUNTER — Ambulatory Visit (HOSPITAL_COMMUNITY)
Admission: RE | Admit: 2019-08-18 | Discharge: 2019-08-18 | Disposition: A | Discharge status: Home / Self Care | Payer: Medicare Other | Source: Ambulatory Visit | Attending: Cardiology | Admitting: Cardiology

## 2019-08-18 VITALS — BP 130/70 | HR 74 | Wt 158.4 lb

## 2019-08-18 DIAGNOSIS — Z7901 Long term (current) use of anticoagulants: Secondary | ICD-10-CM | POA: Insufficient documentation

## 2019-08-18 DIAGNOSIS — M109 Gout, unspecified: Secondary | ICD-10-CM | POA: Insufficient documentation

## 2019-08-18 DIAGNOSIS — Z79890 Hormone replacement therapy (postmenopausal): Secondary | ICD-10-CM | POA: Diagnosis not present

## 2019-08-18 DIAGNOSIS — I428 Other cardiomyopathies: Secondary | ICD-10-CM | POA: Insufficient documentation

## 2019-08-18 DIAGNOSIS — E785 Hyperlipidemia, unspecified: Secondary | ICD-10-CM | POA: Diagnosis not present

## 2019-08-18 DIAGNOSIS — I5022 Chronic systolic (congestive) heart failure: Secondary | ICD-10-CM | POA: Insufficient documentation

## 2019-08-18 DIAGNOSIS — I480 Paroxysmal atrial fibrillation: Secondary | ICD-10-CM | POA: Diagnosis not present

## 2019-08-18 DIAGNOSIS — N184 Chronic kidney disease, stage 4 (severe): Secondary | ICD-10-CM | POA: Diagnosis not present

## 2019-08-18 DIAGNOSIS — R42 Dizziness and giddiness: Secondary | ICD-10-CM | POA: Diagnosis not present

## 2019-08-18 DIAGNOSIS — Z8249 Family history of ischemic heart disease and other diseases of the circulatory system: Secondary | ICD-10-CM | POA: Diagnosis not present

## 2019-08-18 DIAGNOSIS — H8112 Benign paroxysmal vertigo, left ear: Secondary | ICD-10-CM | POA: Diagnosis not present

## 2019-08-18 DIAGNOSIS — E039 Hypothyroidism, unspecified: Secondary | ICD-10-CM | POA: Diagnosis not present

## 2019-08-18 DIAGNOSIS — Z8711 Personal history of peptic ulcer disease: Secondary | ICD-10-CM | POA: Insufficient documentation

## 2019-08-18 DIAGNOSIS — Z885 Allergy status to narcotic agent status: Secondary | ICD-10-CM | POA: Insufficient documentation

## 2019-08-18 DIAGNOSIS — Z79899 Other long term (current) drug therapy: Secondary | ICD-10-CM | POA: Diagnosis not present

## 2019-08-18 DIAGNOSIS — I340 Nonrheumatic mitral (valve) insufficiency: Secondary | ICD-10-CM | POA: Diagnosis not present

## 2019-08-18 LAB — BASIC METABOLIC PANEL
Anion gap: 11 (ref 5–15)
BUN: 35 mg/dL — ABNORMAL HIGH (ref 8–23)
CO2: 22 mmol/L (ref 22–32)
Calcium: 10.2 mg/dL (ref 8.9–10.3)
Chloride: 111 mmol/L (ref 98–111)
Creatinine, Ser: 2.45 mg/dL — ABNORMAL HIGH (ref 0.44–1.00)
GFR calc Af Amer: 21 mL/min — ABNORMAL LOW (ref >60)
GFR calc non Af Amer: 18 mL/min — ABNORMAL LOW (ref >60)
Glucose, Bld: 120 mg/dL — ABNORMAL HIGH (ref 70–99)
Potassium: 4 mmol/L (ref 3.5–5.1)
Sodium: 144 mmol/L (ref 135–145)

## 2019-08-18 MED ORDER — MECLIZINE HCL 12.5 MG PO TABS: 12.5000 mg | ORAL_TABLET | Freq: Two times a day (BID) | ORAL | 1 refills | Status: AC | PRN

## 2019-08-18 MED ORDER — METOPROLOL SUCCINATE ER 100 MG PO TB24: 100.0000 mg | ORAL_TABLET | Freq: Every day | ORAL | 6 refills | Status: DC

## 2019-08-18 NOTE — Progress Notes (Signed)
Advanced Heart Failure Clinic Note   Referring Physician: PCP: Elby Showers, MD PCP-Cardiologist: Ezzard Standing, MD  HF Cardiology: Dr. Aundra Dubin  HPI:  Casey [name] is a 85 y.o. female with long history of nonischemic cardiomyopathy, now CKD stage 3-4. She had cath in 5/05 with normal coronaries. She has MDT CRT-D device. Not on ACEI/ARB/ARNI due to renal dysfunction (tried all in the past). On warfarin with history of paroxysmal atrial fibrillation. Echo in 5/17 with EF 20%, moderate MR, moderate TR, small VSD.   Admitted 07/06/17 with increased SOB and decreased urine output. Echo with EF 10-15%, moderate-severe MR.  Diuresed with IV lasix and metolazone and lost 7 lbs with improvement in symptoms. Transitioned from Lasix to torsemide. Meds optimized but limited due to CKD IV.   TEE in 5/19 showed moderate central MR, not severe.   Left ankle fracture in 7/19 with fixation.   Echo 9/20 showed EF 15% with moderate LV dilation, mildly decreased RV systolic function, mild-moderate MR with normal IVC.   She returns for followup of CHF.   Weight is down 7 lbs.  She uses her cane and denies dyspnea walking on flat ground. No orthopnea/PND.  No chest pain.  Main complaint is of episodes of what sounds like positional vertigo.  They last about 10 minutes and are triggered by changes in head position.  She has not fallen.   ECG (personally reviewed): NSR, BiV pacing  [employer] device interrogation: No AF/VT, 99% BiV pacing, thoracic impedance stable.   Labs (3/19): K 3.9, creatinine 2.6, hgb 15.3 Labs (4/19): LFTs normal Labs (5/19): K 4.9, creatinine 2.75, TSH normal, hgb 14.8 Labs (6/20): K 4.4, creatinine 3.01 Labs (8/20): LDL 123, TGs 204 Labs (9/20): K 4.1, creatinine 2.48 Labs (11/20): LDL 88, K 4.5, creatinine 3.36 Labs (1/21): K 4.5, creatinine 2.47  Review of systems complete and found to be negative unless listed in HPI.    Past Medical History 1. Chronic  systolic CHF: Long-standing nonischemic cardiomyopathy, 5/05 cath without significant CAD. Echo in 5/17 with EF 20%.  - Echo 1/19 with EF 10-15%, prominent dyssynchrony, moderate-severe MR, PASP 91 (but ?if this represents VSD flow rather than TR).  - MDT CRT-D device.  - TEE (5/19): EF 15-20%, mild LV dilation, small peri-membranous VSD, moderate central MR, RV mildly dilated with normal systolic function.  - Echo (9/20): EF 15% with moderate LV dilation, mildly decreased RV systolic function, mild-moderate MR with normal IVC.  2. Atrial fibrillation: Paroxysmal. She is on amiodarone and warfarin.  3. CKD: Stage 4. Follows with nephrology (Dr. Justin Mend).  4. H/o VSD:  Small/restrictive.  5. Mitral regurgitation: Moderate-severe central MR on 1/19 echo, likely functional.   - Moderate central MR on 5/19 TEE.  6. Gout 7. Hypothyroidism 8. PUD  Current Outpatient Medications  Medication Sig Dispense Refill  . allopurinol (ZYLOPRIM) 100 MG tablet Take 1 tablet by mouth once daily 90 tablet 1  . EUTHYROX 75 MCG tablet TAKE 1 TABLET BY MOUTH ONCE DAILY BEFORE BREAKFAST 90 tablet 0  . hydrALAZINE (APRESOLINE) 25 MG tablet Take 1 tablet (25 mg total) by mouth 3 (three) times daily. 270 tablet 2  . isosorbide mononitrate (IMDUR) 30 MG 24 hr tablet Take 1 tablet (30 mg total) by mouth daily. 90 tablet 3  . metoprolol succinate (TOPROL-XL) 100 MG 24 hr tablet Take 1 tablet (100 mg total) by mouth daily. Take with or immediately following a meal. 30 tablet 6  . Multiple Vitamins-Calcium (  ONE-A-DAY WOMENS FORMULA) TABS Take 1 tablet by mouth daily.      . potassium chloride SA (KLOR-CON) 20 MEQ tablet Take 1 tablet (20 mEq total) by mouth daily. 90 tablet 3  . rosuvastatin (CRESTOR) 20 MG tablet Take 1 tablet (20 mg total) by mouth daily. 30 tablet 6  . sodium bicarbonate 650 MG tablet Take 650 mg by mouth 2 (two) times daily.    Marland Kitchen torsemide (DEMADEX) 20 MG tablet Take 2 tablets (40 mg total) by mouth  daily. 60 tablet 3  . warfarin (COUMADIN) 2.5 MG tablet TAKE 1 TABLET BY MOUTH EVERY OTHER DAY 90 tablet 0  . meclizine (ANTIVERT) 12.5 MG tablet Take 1 tablet (12.5 mg total) by mouth every 12 (twelve) hours as needed for dizziness. 60 tablet 1   No current facility-administered medications for this encounter.    Allergies  Allergen Reactions  . Codeine Rash      Social History   Socioeconomic History  . Marital status: Married    Spouse name: Not on file  . Number of children: Not on file  . Years of education: 46  . Highest education level: 11th grade  Occupational History  . Not on file  Tobacco Use  . Smoking status: Never Smoker  . Smokeless tobacco: Never Used  Substance and Sexual Activity  . Alcohol use: No  . Drug use: No  . Sexual activity: Not Currently  Other Topics Concern  . Not on file  Social History Narrative  . Not on file   Social Determinants of Health   Financial Resource Strain: Low Risk   . Difficulty of Paying Living Expenses: Not hard at all  Food Insecurity: No Food Insecurity  . Worried About Charity fundraiser in the Last Year: Never true  . Ran Out of Food in the Last Year: Never true  Transportation Needs: No Transportation Needs  . Lack of Transportation (Medical): No  . Lack of Transportation (Non-Medical): No  Physical Activity: Inactive  . Days of Exercise per Week: 0 days  . Minutes of Exercise per Session: 0 min  Stress: Stress Concern Present  . Feeling of Stress : To some extent  Social Connections:   . Frequency of Communication with Friends and Family: Not on file  . Frequency of Social Gatherings with Friends and Family: Not on file  . Attends Religious Services: Not on file  . Active Member of Clubs or Organizations: Not on file  . Attends Archivist Meetings: Not on file  . Marital Status: Not on file  Intimate Partner Violence:   . Fear of Current or Ex-Partner: Not on file  . Emotionally Abused: Not on  file  . Physically Abused: Not on file  . Sexually Abused: Not on file      Family History  Problem Relation Age of Onset  . Heart failure Mother   . Cancer Father    Vitals:   08/18/19 0913  BP: 130/70  Pulse: 74  SpO2: 96%  Weight: 71.8 kg (158 lb 6.4 oz)     Wt Readings from Last 3 Encounters:  08/18/19 71.8 kg (158 lb 6.4 oz)  06/16/19 74.9 kg (165 lb 3.2 oz)  04/07/19 75.4 kg (166 lb 3.2 oz)    PHYSICAL EXAM: General: NAD Neck: No JVD, no thyromegaly or thyroid nodule.  Lungs: Clear to auscultation bilaterally with normal respiratory effort. CV: Nondisplaced PMI.  Heart regular S1/S2, no S3/S4, 1/6 HSM apex.  No peripheral  edema.  No carotid bruit.  Normal pedal pulses.  Abdomen: Soft, nontender, no hepatosplenomegaly, no distention.  Skin: Intact without lesions or rashes.  Neurologic: Alert and oriented x 3.  Psych: Normal affect. Extremities: No clubbing or cyanosis.  HEENT: Normal.   ASSESSMENT & PLAN:  1. Chronic systolic CHF: Long-standing NICM, 5/05 cath without significant CAD. Echo in 5/17 with EF 20%, echo in 1/19 with EF 10-15%, prominent dyssynchrony, moderate-severe MR, PASP 91 (but ?if this represents VSD flow rather than TR). Complicated by CKD stage 4. Has MDT CRT-D device. In 1/19, she underwent V-V optimization per EP.  TEE in 5/19 showed moderate MR, EF 15-20%.  Echo 9/20 showed EF 15%, mildly decreased RV systolic function, mild-moderate MR. On exam and by Optivol, she is not volume overloaded. Weight stable.  NYHA class II, stable.  Medication titration is limited by CKD stage IV.  - Continue torsemide 40 mg daily.  BMET today.  - Increase Toprol XL to 100 mg daily.   - No ACEI/ARB/ARNI: has failed in the past with AKI.  - Continue hydralazine 25 mg tid and continue Imdur 30 daily.  - Creatinine too high for spironolactone.  2. Atrial fibrillation: Paroxysmal. Maintaining NSR.  - She is now off amiodarone, do not think that this needs to be  restarted   - Continue warfarin.  3. CKD: Stage 4. Followed by Dr. Justin Mend.  - BMET today.    4. H/o VSD: Small peri-membranous VSD on 5/19 TEE.   5. Mitral regurgitation: TEE 5/19 with moderate functional MR.  Echo in 9/20 showed mild-moderate MR.  Not candidate for Mitraclip.    6. Hyperlipidemia: Continue Crestor, lipids acceptable in 11/20.   7. Positional vertigo: She can use meclizine prn.   Followup in 3 months.    Loralie Champagne, MD 08/18/19

## 2019-08-18 NOTE — Patient Instructions (Signed)
INCREASE Toprol XL to 100mg  (1 tab) daily   START Meclizine 12.5mg  (1 tab) every 12 hours as needed for dizziness.   Labs today We will only contact you if something comes back abnormal or we need to make some changes. Otherwise no news is good news!   Your physician recommends that you schedule a follow-up appointment in: 3 months with Dr Aundra Dubin   Please call office at [phone] option 2 if you have any questions or concerns.    At the [facility], you and your health needs are our priority. As part of our continuing mission to provide you with exceptional heart care, we have created designated Provider Care Teams. These Care Teams include your primary Cardiologist (physician) and Advanced Practice Providers (APPs- Physician Assistants and Nurse Practitioners) who all work together to provide you with the care you need, when you need it.   You may see any of the following providers on your designated Care Team at your next follow up: Marland Kitchen Dr Glori Bickers . Dr Loralie Champagne . Darrick Grinder, NP . Lyda Jester, PA . Audry Riles, PharmD   Please be sure to bring in all your medications bottles to every appointment.

## 2019-09-19 ENCOUNTER — Ambulatory Visit: Payer: Medicare Other | Attending: Internal Medicine

## 2019-09-30 ENCOUNTER — Ambulatory Visit (INDEPENDENT_AMBULATORY_CARE_PROVIDER_SITE_OTHER): Payer: Medicare Other | Admitting: *Deleted

## 2019-09-30 DIAGNOSIS — I428 Other cardiomyopathies: Secondary | ICD-10-CM | POA: Diagnosis not present

## 2019-09-30 LAB — CUP PACEART REMOTE DEVICE CHECK
Battery Remaining Longevity: 3 mo
Battery Voltage: 2.77 V
Brady Statistic AP VP Percent: 5.98 %
Brady Statistic AP VS Percent: 0.02 %
Brady Statistic AS VP Percent: 93.03 %
Brady Statistic AS VS Percent: 0.97 %
Brady Statistic RA Percent Paced: 5.96 %
Brady Statistic RV Percent Paced: 98.24 %
Date Time Interrogation Session: 20210420001804
HighPow Impedance: 40 Ohm
HighPow Impedance: 44 Ohm
Implantable Lead Implant Date: 20050525
Implantable Lead Implant Date: 20050525
Implantable Lead Implant Date: 20071119
Implantable Lead Location: 753858
Implantable Lead Location: 753859
Implantable Lead Location: 753860
Implantable Lead Model: 4193
Implantable Lead Model: 5076
Implantable Lead Model: 6947
Implantable Pulse Generator Implant Date: 20141105
Lead Channel Impedance Value: 285 Ohm
Lead Channel Impedance Value: 361 Ohm
Lead Channel Impedance Value: 4047 Ohm
Lead Channel Impedance Value: 4047 Ohm
Lead Channel Impedance Value: 475 Ohm
Lead Channel Impedance Value: 532 Ohm
Lead Channel Pacing Threshold Amplitude: 0.5 V
Lead Channel Pacing Threshold Amplitude: 1.375 V
Lead Channel Pacing Threshold Amplitude: 2.5 V
Lead Channel Pacing Threshold Pulse Width: 0.4 ms
Lead Channel Pacing Threshold Pulse Width: 0.4 ms
Lead Channel Pacing Threshold Pulse Width: 0.6 ms
Lead Channel Sensing Intrinsic Amplitude: 2.375 mV
Lead Channel Sensing Intrinsic Amplitude: 2.375 mV
Lead Channel Sensing Intrinsic Amplitude: 3.125 mV
Lead Channel Sensing Intrinsic Amplitude: 3.125 mV
Lead Channel Setting Pacing Amplitude: 2 V
Lead Channel Setting Pacing Amplitude: 2 V
Lead Channel Setting Pacing Amplitude: 3.75 V
Lead Channel Setting Pacing Pulse Width: 0.4 ms
Lead Channel Setting Pacing Pulse Width: 0.6 ms
Lead Channel Setting Sensing Sensitivity: 0.3 mV

## 2019-10-01 NOTE — Progress Notes (Signed)
ICD Remote  

## 2019-10-03 ENCOUNTER — Telehealth: Payer: Self-pay

## 2019-10-03 NOTE — Telephone Encounter (Signed)
LM for Casey [name] to CB.

## 2019-10-03 NOTE — Telephone Encounter (Signed)
This may be dementia or a stroke. Yes ,we can see her Monday at noon and do lab work however, we may need to get her to a Neurologist  and it may take a couple of weeks to get an appt. with Neurology. It would be best if she were fasting so we can check cholesterol. May have water or black coffee.

## 2019-10-03 NOTE — Telephone Encounter (Signed)
Casey [name] was notified and scheduled appointment for 12 on Monday.  10-06-19

## 2019-10-03 NOTE — Telephone Encounter (Signed)
Patient's daughter Casey [name] called states that she hearing spirits and hear voices from her sister and mother, and a man and they carry on big conversations and she is awake while this happening, in her right mind. Still takes care of house hold bills and cooks.  It is waking her up out of sleep and haunting her.  Casey [name] states to call her brother Casey [name] first at [phone].   They are concerned and want to know if she can be check out and some test run or do they have to go to ER.  Please review and Advise.  Thank you

## 2019-10-06 ENCOUNTER — Ambulatory Visit: Payer: Medicare Other | Admitting: Internal Medicine

## 2019-10-06 ENCOUNTER — Telehealth: Payer: Self-pay

## 2019-10-06 NOTE — Telephone Encounter (Signed)
Patient's daughter called to cancel appointment I offered to reschedule and she declined she said she will be calling back to reschedule.

## 2019-10-14 ENCOUNTER — Telehealth: Payer: Self-pay | Admitting: Internal Medicine

## 2019-10-14 NOTE — Telephone Encounter (Signed)
Suanne Marker [phone]  Lewis called to say he is concerned about his mother, that since maybe February she has been hearing voices, seems scared at times, not sleeping, leaving doors unlocked. He stated that she was on some new medicine for dizziness. I ask what the name of medication was and who put her on it. He did not know, ask him to find out what new medication she was on and that he would need to call them and let them know what is going on.

## 2019-10-14 NOTE — Telephone Encounter (Signed)
Called and spoke to Casey [name], she is also concerned about her mother, she stated that her mother is having conversations with family that is deceased like patients mom and sister, she is having these conversation when she is awake. She also thinks that everyone can hear them, not sleeping. I let her know that there is no way Dr Renold Genta can treat patient by phone that she would have to come in, and that we had scheduled appointment last week and they canceled appointment. She said that her mom convinced them that she was okay, that is why appointment was canceled.Casey [name] stated this started happening after her mom was put on medication for dizziness, she said she had called Dr Claris Gladden office but could not get anyone and that they had giving them an appointment for 11/03/2019, however she feels something needs to be done before then. After speaking with Dr Renold Genta she recommended going to emergency room so she can get a full work up. She stated she would call her brother and talk with him and then they would decide what to do because they are both worried about her being alone in this state.

## 2019-10-17 ENCOUNTER — Telehealth: Payer: Self-pay | Admitting: Internal Medicine

## 2019-10-17 NOTE — Telephone Encounter (Signed)
Pt daughter calling back and said that she wants to set up an appt for her mother for Monday morning as early as possible, they want to get her checked because shes been hearing voices of dead mother and sister, they said pt refused to go to hospital, she has been halucinating and has been calling the cops saying someone is in the house with her when no one is. Please advise [phone]

## 2019-10-17 NOTE — Telephone Encounter (Signed)
She can come at 9:45 am Monday

## 2019-10-20 ENCOUNTER — Ambulatory Visit: Payer: Medicare Other | Admitting: Internal Medicine

## 2019-10-20 ENCOUNTER — Other Ambulatory Visit: Payer: Self-pay

## 2019-10-20 ENCOUNTER — Encounter: Payer: Self-pay | Admitting: Internal Medicine

## 2019-10-20 ENCOUNTER — Ambulatory Visit (INDEPENDENT_AMBULATORY_CARE_PROVIDER_SITE_OTHER): Payer: Medicare Other | Admitting: Internal Medicine

## 2019-10-20 VITALS — BP 110/80 | HR 74 | Temp 98.3°F | Ht 62.0 in | Wt 157.0 lb

## 2019-10-20 DIAGNOSIS — I480 Paroxysmal atrial fibrillation: Secondary | ICD-10-CM | POA: Diagnosis not present

## 2019-10-20 DIAGNOSIS — N184 Chronic kidney disease, stage 4 (severe): Secondary | ICD-10-CM

## 2019-10-20 DIAGNOSIS — R443 Hallucinations, unspecified: Secondary | ICD-10-CM

## 2019-10-20 DIAGNOSIS — R7302 Impaired glucose tolerance (oral): Secondary | ICD-10-CM

## 2019-10-20 DIAGNOSIS — I428 Other cardiomyopathies: Secondary | ICD-10-CM | POA: Diagnosis not present

## 2019-10-20 DIAGNOSIS — I10 Essential (primary) hypertension: Secondary | ICD-10-CM

## 2019-10-20 DIAGNOSIS — Q210 Ventricular septal defect: Secondary | ICD-10-CM | POA: Diagnosis not present

## 2019-10-20 DIAGNOSIS — E782 Mixed hyperlipidemia: Secondary | ICD-10-CM | POA: Diagnosis not present

## 2019-10-20 DIAGNOSIS — R718 Other abnormality of red blood cells: Secondary | ICD-10-CM

## 2019-10-20 DIAGNOSIS — H903 Sensorineural hearing loss, bilateral: Secondary | ICD-10-CM

## 2019-10-20 DIAGNOSIS — R413 Other amnesia: Secondary | ICD-10-CM | POA: Diagnosis not present

## 2019-10-20 DIAGNOSIS — E039 Hypothyroidism, unspecified: Secondary | ICD-10-CM

## 2019-10-20 DIAGNOSIS — Z7901 Long term (current) use of anticoagulants: Secondary | ICD-10-CM | POA: Diagnosis not present

## 2019-10-20 NOTE — Progress Notes (Signed)
   Subjective:    Patient ID: Casey [name], female    DOB: 23-Nov-1934, 85 y.o.   MRN: [id number]  HPI 85 year old Female with history of hypothyroidism, chronic kidney disease, glaucoma, nonischemic cardiomyopathy with low ejection fraction, implantable defibrillator, hyperlipidemia and hypertension.  She has a history of impaired glucose tolerance. She is on chronic anticoagulation per Cardiology and is monitored through that office.  She is hard of hearing.  She resides alone.  Her son brings her in today.  He lives here in [place].  Daughter lives in [place].  Apparently patient has been hearing voices particularly those  of her deceased sister.  Son found her out in the yard with a butcher knife recently.  She has become increasingly paranoid apparently.  Up until recently, she has been driving.  She is on chronic anticoagulation. Neighbor across the street checks on her. Son and daughter say she is able to prepare meals.    Last seen by cardiologist January 2021 and has upcoming appointment later this month.  She used to go to cardiac rehab but wanted to quit the program in October 2020.  Patient tells me she is hearing voices, mainly of her deceased sister and says sister seems angry with her.    Review of Systems No headache. No known fall at home.     Objective:   Physical Exam Blood pressure 110/80 pulse 74 temperature 98.3 degrees orally pulse oximetry 94% weight 157 pounds  Skin warm and dry.  Nodes none.  TMs are clear.  Extraocular movements are full.  PERRLA.  Brief neurological exam shows no focal deficits.  Her muscle strength is normal.  Her deep tendon reflexes are symmetrical and normal.  Cranial nerves II through XII are grossly intact.  She is pleasant and cooperative.  She is clearly disturbed that her sister is speaking with her and appears to be angry with her.  She has no lower extremity edema.       Assessment & Plan:    New onset hallucinations  worrisome for mental disorder perhaps schizophrenia  ?  Dementia-check labs for metabolic abnormalities  Chronic anticoagulation-check pro time  Rule out structural brain abnormality-order CT of the brain  History of hypothyroidism-check TSH  Resides alone-discussed at length with son and daughter.  I do not feel it is safe for her to be alone at the present time.  Options discussed.  We can order home health services but of course is her intermittent calls to the home.  Neighbor across the street is supportive.  Plan I spent about 45 minutes with patient, her son and her son and her daughter via virtual connection.  Listening to their concerns and advising them that she is going to need another level of care.  She should not be left alone at this point in time.  Explained to them that we are going to rule out CNS abnormalities and metabolic abnormalities that could be perhaps causing her to hear voices.  If this work-up is negative, then psychiatric evaluation will be necessary.  I am not sure if her son quite understands this.  Patient resides alone but has supportive neighbors.  Patient is not agreeable to going to nursing home where her husband resides.  We will order home health services at this point in time.

## 2019-10-22 ENCOUNTER — Telehealth: Payer: Self-pay | Admitting: Internal Medicine

## 2019-10-22 ENCOUNTER — Encounter: Payer: Self-pay | Admitting: Internal Medicine

## 2019-10-22 LAB — CBC WITH DIFFERENTIAL/PLATELET
Absolute Monocytes: 542 cells/uL (ref 200–950)
Basophils Absolute: 63 cells/uL (ref 0–200)
Basophils Relative: 1 %
Eosinophils Absolute: 82 cells/uL (ref 15–500)
Eosinophils Relative: 1.3 %
HCT: 43.1 % (ref 35.0–45.0)
Hemoglobin: 13.9 g/dL (ref 11.7–15.5)
Lymphs Abs: 977 cells/uL (ref 850–3900)
MCH: 32.6 pg (ref 27.0–33.0)
MCHC: 32.3 g/dL (ref 32.0–36.0)
MCV: 101.2 fL — ABNORMAL HIGH (ref 80.0–100.0)
MPV: 13 fL — ABNORMAL HIGH (ref 7.5–12.5)
Monocytes Relative: 8.6 %
Neutro Abs: 4637 cells/uL (ref 1500–7800)
Neutrophils Relative %: 73.6 %
Platelets: 84 10*3/uL — ABNORMAL LOW (ref 140–400)
RBC: 4.26 10*6/uL (ref 3.80–5.10)
RDW: 13.9 % (ref 11.0–15.0)
Total Lymphocyte: 15.5 %
WBC: 6.3 10*3/uL (ref 3.8–10.8)

## 2019-10-22 LAB — COMPLETE METABOLIC PANEL WITH GFR
AG Ratio: 1.6 (calc) (ref 1.0–2.5)
ALT: 13 U/L (ref 6–29)
AST: 19 U/L (ref 10–35)
Albumin: 4.2 g/dL (ref 3.6–5.1)
Alkaline phosphatase (APISO): 140 U/L (ref 37–153)
BUN/Creatinine Ratio: 17 (calc) (ref 6–22)
BUN: 39 mg/dL — ABNORMAL HIGH (ref 7–25)
CO2: 23 mmol/L (ref 20–32)
Calcium: 10.1 mg/dL (ref 8.6–10.4)
Chloride: 111 mmol/L — ABNORMAL HIGH (ref 98–110)
Creat: 2.3 mg/dL — ABNORMAL HIGH (ref 0.60–0.88)
GFR, Est African American: 22 mL/min/{1.73_m2} — ABNORMAL LOW (ref >=60)
GFR, Est Non African American: 19 mL/min/{1.73_m2} — ABNORMAL LOW (ref >=60)
Globulin: 2.6 g/dL (calc) (ref 1.9–3.7)
Glucose, Bld: 117 mg/dL — ABNORMAL HIGH (ref 65–99)
Potassium: 4.7 mmol/L (ref 3.5–5.3)
Sodium: 147 mmol/L — ABNORMAL HIGH (ref 135–146)
Total Bilirubin: 2.3 mg/dL — ABNORMAL HIGH (ref 0.2–1.2)
Total Protein: 6.8 g/dL (ref 6.1–8.1)

## 2019-10-22 LAB — TEST AUTHORIZATION

## 2019-10-22 LAB — PROTIME-INR
INR: 1.1
Prothrombin Time: 11.4 s (ref 9.0–11.5)

## 2019-10-22 LAB — TSH: TSH: 2.53 mIU/L (ref 0.40–4.50)

## 2019-10-22 LAB — B12 AND FOLATE PANEL
Folate: 10 ng/mL
Vitamin B-12: 461 pg/mL (ref 200–1100)

## 2019-10-22 NOTE — Telephone Encounter (Signed)
Protime INR is subtherpeutic as if patient has not been taking Coumadin. Daughter, Ms. Benito Mccreedy, contacted about this. Daughter says neighbor has been watching patient and have asked that patient be restarted on Coumadin. Has cardiology appt later this month. B12 and folate are WNL. Has chronic kidney disease but this is not new. Glucose was 117. Total bili is 2.3 and needs follow up. She was not jaundiced in office. So far no metabolic reason for hearing voices. Likely needs Psych consult but family has not been able to get walk-in CT and appt has been made for May 24 for CT of head. Other option is ED at [facility] where she can have psych evaluation.

## 2019-10-22 NOTE — Telephone Encounter (Signed)
Son called to say there was a 35 minute wait for CT of head and he could not wait. Told him it was important to get this study to move forward with mother's evaluation. He wanted [place] to intervene and I told him it was a walk-in appt situation at Radiology and I could not change their policies. He needed to get the test done today in my opinion. He said it would have to wait until may 24 when an appt has been made. Thus we cannot proceed with mental evaluation until that study is done.

## 2019-10-23 ENCOUNTER — Encounter: Payer: Self-pay | Admitting: Internal Medicine

## 2019-10-23 NOTE — Patient Instructions (Signed)
Discussion with son in- person and daughter virtually at the same time today regarding my concerns with patient having hallucinations and residing alone.  T  We can order home health services but they will not provide 24/7 care.  Patient should not be left alone if she is hearing voices.  We will began work-up for metabolic and structural neurological problems that could possibly explain auditory hallucinations.  If work-up is negative, will refer to behavioral health for evaluation.  Daughter has been encouraged to investigate options for either in-home care or nursing facility care once medical and psychological evaluations are completed.

## 2019-10-26 ENCOUNTER — Other Ambulatory Visit: Payer: Self-pay | Admitting: Internal Medicine

## 2019-10-26 DIAGNOSIS — E039 Hypothyroidism, unspecified: Secondary | ICD-10-CM

## 2019-10-31 ENCOUNTER — Telehealth: Payer: Self-pay

## 2019-10-31 NOTE — Telephone Encounter (Signed)
Spoke with patient to remind of missed remote transmission 

## 2019-11-03 ENCOUNTER — Encounter (HOSPITAL_COMMUNITY): Payer: Medicare Other | Admitting: Cardiology

## 2019-11-03 ENCOUNTER — Other Ambulatory Visit: Payer: Medicare Other

## 2019-11-04 ENCOUNTER — Other Ambulatory Visit: Payer: Self-pay

## 2019-11-04 ENCOUNTER — Other Ambulatory Visit: Payer: Self-pay | Admitting: Internal Medicine

## 2019-11-04 MED ORDER — WARFARIN SODIUM 2.5 MG PO TABS: 2.5000 mg | ORAL_TABLET | ORAL | 0 refills | Status: DC

## 2019-11-17 DIAGNOSIS — I428 Other cardiomyopathies: Secondary | ICD-10-CM | POA: Diagnosis not present

## 2019-11-17 DIAGNOSIS — I4891 Unspecified atrial fibrillation: Secondary | ICD-10-CM | POA: Diagnosis not present

## 2019-11-17 DIAGNOSIS — N184 Chronic kidney disease, stage 4 (severe): Secondary | ICD-10-CM | POA: Diagnosis not present

## 2019-11-17 DIAGNOSIS — D631 Anemia in chronic kidney disease: Secondary | ICD-10-CM | POA: Diagnosis not present

## 2019-11-17 DIAGNOSIS — Z95810 Presence of automatic (implantable) cardiac defibrillator: Secondary | ICD-10-CM | POA: Diagnosis not present

## 2019-11-17 DIAGNOSIS — M4800 Spinal stenosis, site unspecified: Secondary | ICD-10-CM | POA: Diagnosis not present

## 2019-11-17 DIAGNOSIS — Q600 Renal agenesis, unilateral: Secondary | ICD-10-CM | POA: Diagnosis not present

## 2019-11-17 DIAGNOSIS — E039 Hypothyroidism, unspecified: Secondary | ICD-10-CM | POA: Diagnosis not present

## 2019-11-17 DIAGNOSIS — N2581 Secondary hyperparathyroidism of renal origin: Secondary | ICD-10-CM | POA: Diagnosis not present

## 2019-11-24 ENCOUNTER — Ambulatory Visit
Admission: RE | Admit: 2019-11-24 | Discharge: 2019-11-24 | Disposition: A | Discharge status: Home / Self Care | Payer: Medicare Other | Source: Ambulatory Visit | Attending: Internal Medicine | Admitting: Internal Medicine

## 2019-11-24 ENCOUNTER — Telehealth (INDEPENDENT_AMBULATORY_CARE_PROVIDER_SITE_OTHER): Payer: PRIVATE HEALTH INSURANCE | Admitting: Internal Medicine

## 2019-11-24 ENCOUNTER — Encounter: Payer: Self-pay | Admitting: Internal Medicine

## 2019-11-24 ENCOUNTER — Inpatient Hospital Stay: Admission: RE | Admit: 2019-11-24 | Payer: Medicare Other | Source: Ambulatory Visit

## 2019-11-24 DIAGNOSIS — I6350 Cerebral infarction due to unspecified occlusion or stenosis of unspecified cerebral artery: Secondary | ICD-10-CM

## 2019-11-24 DIAGNOSIS — R519 Headache, unspecified: Secondary | ICD-10-CM | POA: Diagnosis not present

## 2019-11-24 DIAGNOSIS — R443 Hallucinations, unspecified: Secondary | ICD-10-CM

## 2019-11-24 NOTE — Telephone Encounter (Signed)
Phone call to daughter, Mrs. Benito Mccreedy, regarding results of CT scan of brain showing chronic infarcts right frontal lobe and cerebellum several which are apparently new. Not clear if patient is taking Coumadin or not. Has not had PT checked since May. At that time, it was 1.1 and I believed patient was not taking Coumadin. Daughter says patient not scheduled to see Cardiologist until August. I believe she needs PT checked before then either here at this office or at Cardiology. I am referring patient to Neurologist as I believe  issues such as hearing her sister's voice may be due to CVA and dementia.Still residing at home alone.Son lives here in [place]. Husband is a patient at [facility].  Daughter indicated she would like for me to call patient with results of CT. I have done this today. Patient says she is taking Coumadin daily. Says she will call cardiologist to have PT checked.  Perhaps it would help for [facility] nurse to be involved.Patient agreeable to this.  MJB, MD  Phone calls to daughter in [place] and to patient today- total time including reviewing medical records 20 minutes.

## 2019-11-25 ENCOUNTER — Encounter: Payer: Self-pay | Admitting: Internal Medicine

## 2019-11-25 ENCOUNTER — Telehealth: Payer: Self-pay | Admitting: Internal Medicine

## 2019-11-25 NOTE — Telephone Encounter (Signed)
I called patient to tell her a [place] would be making contact with her in the home soon. This is for chronic conditions complex care management especially regarding Coumadin therapy.

## 2019-11-26 ENCOUNTER — Telehealth (HOSPITAL_COMMUNITY): Payer: Self-pay | Admitting: *Deleted

## 2019-11-26 ENCOUNTER — Telehealth: Payer: Self-pay

## 2019-11-26 ENCOUNTER — Other Ambulatory Visit: Payer: Self-pay | Admitting: *Deleted

## 2019-11-26 ENCOUNTER — Encounter: Payer: Self-pay | Admitting: *Deleted

## 2019-11-26 DIAGNOSIS — I480 Paroxysmal atrial fibrillation: Secondary | ICD-10-CM

## 2019-11-26 NOTE — Telephone Encounter (Signed)
Casey [name] states she received a referral from pt's PCP, doesn't seem anyone has been managing her INR levels and patient doesn't seem to have been taking coumadin on a regular basis. She called [facility] to sch pt f/u with them but was told pt would need referral. Advised we would place ref, she states pt son will take her to appts and pt has been taking coumadin recently.

## 2019-11-26 NOTE — Telephone Encounter (Signed)
Carelink alert received, battery has reached RRT as of 11/25/19.  Pt has not been seen in office since 2017.  She is scheduled for a Device clinic in person check on 6/22.  This needs to be an MD visit.   Attempted to reach pt to discuss, no answer/ no voicemail.   Sending to scheduling to reschedule pt visit.

## 2019-11-26 NOTE — Patient Outreach (Signed)
[facility]) Care Management  11/26/2019  Casey [name] [birth year] [id number]   Referral Date: 6/15 Referral Source: MD office Referral Reason: Lives alone, concern for ability to care for herself Insurance: Next Gen   Outreach attempt #1, successful, identity verified.  This care manager introduced self and stated purpose of call.  [facility] care management services explained.   Social: Member lives alone, state she is able to bathe/dress independently, able to cook for herself.  Daughter lives in [place] and son lives here in the area.  This care manager inquired about MD and family's concern regarding hallucinations.  She report she does still hear her sister and sister's boyfriend in her home, state she consistently tells them to leave.  Her husband had a stroke and now resides in SNF.  Conditions: Per chart, has history of HTN, CHF, cardiomyopathy, A-fib, Hypothyroidism, hearing loss, CKD, and HLD.  Report she had CT earlier this week that showed that she has also had strokes, referral to neurology has been placed.   Medications: Reviewed with member.  State she has them written down for what she should be taking.  Admits that she has not been taking properly, state she is taking all now.    Appointments: Per chart, she has appointment with the device clinic on 6/22 for her defibrillator.  She was not aware of this appointment but takes down information to give to her son as he provide transportation to appointments.  Noted that she does not have appointment with coumadin clinic, call placed to schedule, made aware that member has never been seen there and would need referral.  Call then placed to HF clinic to request referral for coumadin clinic.  Noted that she has appointment at HF clinic on 8/23, member is aware of that visit.    During conversation, it was unclear if member understood information that was provided.  This may be due to stroke and/or member's decreased  hearing (she stated she had hearing aids in during the call).  Call was placed to son and daughter to discuss plan.  They both agree that member may not be safe in the home for much longer.  They agree to consider placement but would like to discuss options with CSW.  Denies any urgent concerns at this time, encouraged to contact this care manager with questions.  Plan: RN CM will place referral to CSW and follow up with member/children within the next 2 weeks.  Fall Risk  11/26/2019 10/20/2019 03/04/2019 10/14/2018 03/21/2018  Falls in the past year? 0 0 1 1 Yes  Number falls in past yr: 0 0 1 0 2 or more  Injury with Fall? 0 0 1 0 Yes  Comment - - - - fractured left ankle  Risk for fall due to : - Other (Comment) Impaired balance/gait;History of fall(s);Impaired mobility;Other (Comment) - -  Risk for fall due to: Comment - - Single Leg Stand Test: 2.68 - -  Follow up - Falls evaluation completed Falls evaluation completed - -   Depression screen [facility] 2/9 11/26/2019 03/04/2019 10/14/2018 03/21/2018 01/26/2017  Decreased Interest 0 0 0 0 1  Down, Depressed, Hopeless 0 0 0 0 0  PHQ - 2 Score 0 0 0 0 1  Some recent data might be hidden   [facility] CM Care Plan Problem One     Most Recent Value  Care Plan Problem One Knowledge deficit regarding management of chronic medical conditions  Role Documenting the Problem One Care Management  Coordinator  Care Plan for Problem One Active  THN Long Term Goal  Member will be able to verbalize heart failure yellow zone and reason for daily HR check over the next 31 days  THN Long Term Goal Start Date 11/26/19  Interventions for Problem One Long Term Goal Member educated on heart failure zones and meaning of HF and A-fib.  Copy of HF zones mailed to member.  Referral placed to social worker for concern of need for increased support.  THN CM Short Term Goal #1  Member will report taking all medications as instructed over the next 4 weeks  THN CM Short Term Goal #1 Start  Date 11/26/19  Interventions for Short Term Goal #1 Medications reviewed with member.  Educated on importance of taking as instructed to effectively manage chronic medical conditions  THN CM Short Term Goal #2  Member will report completing appointment for device clinic and coumadin check within the next 2 weeks  THN CM Short Term Goal #2 Start Date 11/26/19  Interventions for Short Term Goal #2 Member's schedule reviewed, advised to device appointment on 6/22 and call placed to coumadin clinic and HF clinic to request appointment     Valente David, RN, MSN [place] Manager [phone]

## 2019-11-27 NOTE — Telephone Encounter (Signed)
Spoke with patient's daughter, Rolanda Lundborg, who lives in [place]. She states pt had been getting billed for INR checks at PCP office. Advised her that pt may also have a copay to have INRs checked with [place]. PCP had been prescribing warfarin for the past few years. Most recent INR from last month was 1 and do not see any dosing recommendations or follow up.  Pt already scheduled for device check at 8:30am next Tuesday, added on INR check as well per pt's daughter's request. Left message for pt to see if she can let us know what dose of warfarin she takes ahead of time so we can update our records.

## 2019-11-27 NOTE — Progress Notes (Signed)
Thank you :)

## 2019-12-02 ENCOUNTER — Telehealth: Payer: Self-pay | Admitting: *Deleted

## 2019-12-02 NOTE — Telephone Encounter (Addendum)
Called pt since she had an appt at 9am and did not show up. Since I was unable to reach the pt I left a message on her voicemail then called her daughter and spoke with Lenette. Per Lenette someone called the pt back and told her that her appts for 12/02/2019 had been canceled and moved to 12/30/2019. Advised that her defib appt check was moved and that since she needs to be established with [place] we still need to see her to check her INR. Apologized for the mix up with the appt.  She verbalized understanding and will call her mom to see the best day and time for a NEW COUMADIN slot at 1030am (pt prefers) or 230pm, and she will call me back or I will call her back at 12 noon.   At 1242pm, spoke with dtr and she made the appointment for 12/05/2019 at 1030am, for her mom and she will update her. Also, she states the pt has been taking the medication as stated on the warfarin bottle, which is 2.5mg  every other day.  Daughter reports the pt is hard of hearing and has bilateral hearing aids so it is helpful to speak clear and slowly for comprehension. Pt will be coming alone to this appointment and the daughter would like a call if possible after appt for an update.

## 2019-12-04 ENCOUNTER — Encounter: Payer: Self-pay | Admitting: *Deleted

## 2019-12-04 ENCOUNTER — Other Ambulatory Visit: Payer: Self-pay | Admitting: *Deleted

## 2019-12-04 NOTE — Patient Outreach (Signed)
[facility]) Care Management  12/04/2019  KEENAN DIMITROV Nov 03, 1934 [id number]   CSW made an initial attempt to try and contact patient today to perform the initial phone assessment, as well as assess and assist with social work needs and services, without success.  A HIPAA compliant message was left for patient on her cell phone voicemail; however, CSW was unable to leave a HIPAA compliant message on home voicemail, as the phone just continued to ring.  CSW is currently awaiting a return call.  CSW will make a second outreach attempt within the next 3-4 business days, if a return call is not received from patient in the meantime.  CSW will also mail an Unsuccessful Outreach Letter to patient's home requesting that patient contact CSW at her earliest convenience, if she is interested in receiving social work services through [place] with Scientist, clinical (histocompatibility and immunogenetics).  Nat Christen, BSW, MSW, LCSW  Licensed Education officer, environmental Health System  Mailing [place] [address] [place], [place] [address] Physical Address-[address], [place], [place] [address] Toll Free Main # [phone] Fax # [phone] Cell # [phone]  Office # [phone] [email or web address]

## 2019-12-04 NOTE — Progress Notes (Signed)
Would you please call patient's daughter Casey [name] [phone] and let her know you tried to call patient. Maybe you can discuss with her what services are available and see if she has any concerns at this point. Have had conversations with both son and daughter. Son lives here in town. Daughter lives in [place]. The last time I talked with [name], she seemed to think patient was a bit better but still hearing voices at times. We have been concerned about compliance with meds especially Coumadin. A Neurology referral has been made due to concern for stroke based on recent CT of brain and memory issues.

## 2019-12-05 ENCOUNTER — Ambulatory Visit (INDEPENDENT_AMBULATORY_CARE_PROVIDER_SITE_OTHER): Payer: Medicare Other | Admitting: *Deleted

## 2019-12-05 ENCOUNTER — Other Ambulatory Visit: Payer: Self-pay

## 2019-12-05 DIAGNOSIS — Z5181 Encounter for therapeutic drug level monitoring: Secondary | ICD-10-CM | POA: Diagnosis not present

## 2019-12-05 DIAGNOSIS — I480 Paroxysmal atrial fibrillation: Secondary | ICD-10-CM

## 2019-12-05 LAB — POCT INR: INR: 1.1 — AB (ref 2.0–3.0)

## 2019-12-05 NOTE — Patient Instructions (Addendum)
A full discussion of the nature of anticoagulants has been carried out.  A benefit risk analysis has been presented to the patient, so that they understand the justification for choosing anticoagulation at this time. The need for frequent and regular monitoring, precise dosage adjustment and compliance is stressed.  Side effects of potential bleeding are discussed.  The patient should avoid any OTC items containing aspirin or ibuprofen, and should avoid great swings in general diet.  Avoid alcohol consumption.  Call if any signs of abnormal bleeding.   Description   Start taking  warfarin 1 tablet (2.5mg ) daily. You can eat the leafy veggies twice a week. Recheck INR in 1 week. Anticoagulation Clinic [phone]

## 2019-12-10 ENCOUNTER — Encounter: Payer: Self-pay | Admitting: *Deleted

## 2019-12-10 ENCOUNTER — Other Ambulatory Visit: Payer: Self-pay | Admitting: *Deleted

## 2019-12-10 NOTE — Patient Outreach (Signed)
[facility]) Care Management  12/10/2019  MARESSA APOLLO [birth year] [id number]   CSW was able to make initial contact with patient's daughter, Rollene Fare today to perform the phone assessment on patient, as well as assess and assist with social work needs and services.  CSW introduced self, explained role and types of services provided through [place] [facility] ([place] [facility]).  CSW further explained to [name] that [place] works with patient's RNCM, also with [place] Management, [place].  CSW then explained the reason for the call, indicating that Mrs. Orene Desanctis, as well as patient's Primary Care Physician, Dr. Tedra Senegal thought that patient would benefit from social work services and resources to assist with arranging in-home care services for patient versus pursuing long-term care placement into an assisted living facility.  CSW obtained two HIPAA compliant identifiers from [name], which included patient's name and date of birth.  After CSW thoroughly explained the placement process to [name], Mrs. Benito Mccreedy stated, "I don't think we're to that point yet, but I would like to have information available for when we are".  Mrs. Benito Mccreedy went on to say that she and her family have been trying to convince patient to consider long-term care placement into an assisted living facility, but that patient is extremely independent and "very against" placement, of any kind, not even willing to consider this as an option at this time.  CSW voiced understanding, agreeing to email [name] [email or web address]) all of the following resource information, for future reference:  [employer]; [facility] Tips; [place]-2 Form; [place] Directory - Assisted Living Facilities; Assisted Living Facilities, [facility] and [place] Approved through [place] then  inquired about in-home care services, and whether or not patient would be agreeable to having someone come into the home to offer assistance with activities of daily living, meal preparation, grocery shopping, laundry, light housekeeping duties, transportation to and from physician appointments, medication administration, etc.  Mrs. Benito Mccreedy denied, indicating that patient has already said, on several different occasions, "I don't need a babysitter", not at all agreeable to having "a stranger come into the home to provide assistance".  Mrs. Benito Mccreedy further reported that patient is still able to perform all activities of daily living independently, prepare her own meals, clean her own home, do her own laundry and administer her own medications, with accuracy.  Mrs. Benito Mccreedy is hopeful that once patient's Warfarin (Coumadin) "is in her system again", that patient will no longer experience auditory and visual hallucinations of her deceased sister.  Mrs. Benito Mccreedy reported that her brother, Selenne, [place] currently residing near patient, as well as [name]'s childhood friend, Rip Harbour, currently residing directly across the street from patient, check in with patient on a daily basis, either by phone, or in person, or both, to ensure that she has everything she needs to be successful in the home.  Mr. Lokken and Mrs. Midgett also perform all of patient's grocery shopping for her, assist with meal preparation, ensure that patient is taking her medications exactly as prescribed and transport patient to and from all of her physician appointments.  CSW agreed to also email [name] the following list of resources to have at her disposal, for future reference:  Adult [place]; In-Home Care and Respite Agencies; [place]; PCS ([place]) Application; PCS (Personal Care Services) Providers; CAPS (Community Alternative  Program Services) Information; CAPS Veterinary surgeon) Application.       CSW offered to assist [name] with completing any, and all, of the above named applications, as well as assist with the referral process, once patient is ready to receive services, of any kind.  CSW was able to confirm that [name] has the correct contact information for CSW, encouraging her to contact CSW directly if patient changes her mind about receiving social work services, or if additional social work needs arise in the near future.  CSW will perform a case closure on patient, as all goals of treatment have been met from social work standpoint and no additional social work needs have been identified at this time.  CSW will notify patient's RNCM with [facility] Management, Valente David of CSW's plans to close patient's case.  CSW will fax an update to patient's Primary Care Physician, Dr. Tedra Senegal to ensure that she is aware of CSW's involvement with patient's plan of care.    Nat Christen, BSW, MSW, LCSW  Licensed Education officer, environmental Health System  [address]ailing [place] [address], [place], [place] [address] Physical Address-[address], [place], [place] [address] Toll Free Main # [phone] Fax # [phone] Cell # [phone]  Office # [phone] [email or web address]

## 2019-12-12 ENCOUNTER — Other Ambulatory Visit: Payer: Self-pay | Admitting: *Deleted

## 2019-12-12 NOTE — Patient Outreach (Signed)
[facility]) Care Management  12/12/2019  Casey [name] 06/12/1935 [id number]   Call placed to member's daughter Lenette to follow up on contact with CSW and member's overall care.  She report this is not a good time to talk but she will call this care manager back later.  Will await call back, if no call back will follow up within the next 3-4 business days.  Valente David, [place], MSN [place] [phone]

## 2019-12-17 ENCOUNTER — Other Ambulatory Visit: Payer: Self-pay

## 2019-12-17 ENCOUNTER — Ambulatory Visit (INDEPENDENT_AMBULATORY_CARE_PROVIDER_SITE_OTHER): Payer: Medicare Other | Admitting: *Deleted

## 2019-12-17 DIAGNOSIS — Z5181 Encounter for therapeutic drug level monitoring: Secondary | ICD-10-CM | POA: Diagnosis not present

## 2019-12-17 DIAGNOSIS — I480 Paroxysmal atrial fibrillation: Secondary | ICD-10-CM

## 2019-12-17 LAB — POCT INR: INR: 1.5 — AB (ref 2.0–3.0)

## 2019-12-17 NOTE — Patient Instructions (Addendum)
Description   Today take 1.5 tablets then start taking warfarin 1 tablet (2.5mg ) daily except 1.5 tablets (3.75mg ) on Mondays and Fridays.  You can eat the leafy veggies twice a week. Recheck INR in 1 week. Anticoagulation Clinic [phone]

## 2019-12-18 ENCOUNTER — Other Ambulatory Visit: Payer: Self-pay | Admitting: *Deleted

## 2019-12-18 NOTE — Patient Outreach (Signed)
[place] [facility]) Care Management  12/18/2019  Casey [name] 03/15/1935 [id number]   Outreach attempt #2, successful to daughter.  She report member has been doing better since she had her medications reviewed taking them correctly now.  She was seen in the coumadin clinic yesterday, dose adjusted as result was 1.5.  Next check will be on 7/20, she will also be seen by cardiologist at that time.  She report member has been able to complete ADL's independently but her brother, member's son, still check on her daily.  Confirms that she spoke with CSW but admits that she hasn't opened the package of resources provided.  State they are no longer ready to have member placed but is interested in applying for Medicaid in effort to have in home aide support provided.  She will review information and contact CSW for assistance.  Denies any urgent concerns, will follow up within the next month.  Goals Addressed              This Visit's Progress   .  Daughter stated: I want her to continue taking care of herself (pt-stated)        CARE PLAN ENTRY (see longtitudinal plan of care for additional care plan information)   Current Barriers:  Marland Kitchen Knowledge deficit related to basic heart failure pathophysiology and self care management . Knowledge Deficits related to heart failure medications . Literacy Barriers . Cognitive Deficits  Case Manager Clinical Goal(s):  Marland Kitchen Over the next 31 days, patient will verbalize understanding of Heart Failure Action Plan and when to call doctor . Over the next 28 days, patient will take all Heart Failure mediations as prescribed . Over the next 28 days, patient will weigh daily and record (notifying MD of 3 lb weight gain over night or 5 lb in a week)  Interventions:  . Basic overview and discussion of pathophysiology of Heart Failure reviewed  . Provided verbal education on low sodium diet . Reviewed Heart Failure Action Plan in depth and provided  written copy . Assessed need for readable accurate scales in home . Discussed importance of daily weight and advised patient to weigh and record daily  Patient Self Care Activities:  . Takes Heart Failure Medications as prescribed . Weighs daily and record (notifying MD of 3 lb weight gain over night or 5 lb in a week)  Initial goal documentation       Valente David, RN, MSN [place] Manager [phone]

## 2019-12-30 ENCOUNTER — Ambulatory Visit (INDEPENDENT_AMBULATORY_CARE_PROVIDER_SITE_OTHER): Payer: Medicare Other | Admitting: *Deleted

## 2019-12-30 ENCOUNTER — Encounter: Payer: Medicare Other | Admitting: Internal Medicine

## 2019-12-30 DIAGNOSIS — I428 Other cardiomyopathies: Secondary | ICD-10-CM | POA: Diagnosis not present

## 2019-12-30 LAB — CUP PACEART REMOTE DEVICE CHECK
Battery Remaining Longevity: 1 mo — CL
Battery Voltage: 2.67 V
Brady Statistic AP VP Percent: 2.78 %
Brady Statistic AP VS Percent: 0.02 %
Brady Statistic AS VP Percent: 95.32 %
Brady Statistic AS VS Percent: 1.88 %
Brady Statistic RA Percent Paced: 2.78 %
Brady Statistic RV Percent Paced: 96.83 %
Date Time Interrogation Session: 20210720012503
HighPow Impedance: 36 Ohm
HighPow Impedance: 44 Ohm
Implantable Lead Implant Date: 20050525
Implantable Lead Implant Date: 20050525
Implantable Lead Implant Date: 20071119
Implantable Lead Location: 753858
Implantable Lead Location: 753859
Implantable Lead Location: 753860
Implantable Lead Model: 4193
Implantable Lead Model: 5076
Implantable Lead Model: 6947
Implantable Pulse Generator Implant Date: 20141105
Lead Channel Impedance Value: 285 Ohm
Lead Channel Impedance Value: 342 Ohm
Lead Channel Impedance Value: 4047 Ohm
Lead Channel Impedance Value: 4047 Ohm
Lead Channel Impedance Value: 456 Ohm
Lead Channel Impedance Value: 513 Ohm
Lead Channel Pacing Threshold Amplitude: 0.5 V
Lead Channel Pacing Threshold Amplitude: 1.375 V
Lead Channel Pacing Threshold Amplitude: 2.5 V
Lead Channel Pacing Threshold Pulse Width: 0.4 ms
Lead Channel Pacing Threshold Pulse Width: 0.4 ms
Lead Channel Pacing Threshold Pulse Width: 0.6 ms
Lead Channel Sensing Intrinsic Amplitude: 1.25 mV
Lead Channel Sensing Intrinsic Amplitude: 1.25 mV
Lead Channel Sensing Intrinsic Amplitude: 2.5 mV
Lead Channel Sensing Intrinsic Amplitude: 2.5 mV
Lead Channel Setting Pacing Amplitude: 2 V
Lead Channel Setting Pacing Amplitude: 2 V
Lead Channel Setting Pacing Amplitude: 3.5 V
Lead Channel Setting Pacing Pulse Width: 0.4 ms
Lead Channel Setting Pacing Pulse Width: 0.6 ms
Lead Channel Setting Sensing Sensitivity: 0.3 mV

## 2020-01-01 NOTE — Progress Notes (Signed)
Remote ICD transmission.   

## 2020-01-12 ENCOUNTER — Telehealth (HOSPITAL_COMMUNITY): Payer: Self-pay | Admitting: *Deleted

## 2020-01-12 NOTE — Telephone Encounter (Signed)
Pt is overdue for follow up and missed her appt on 7/20. Left message for pt - needs follow up scheduled before refill can be sent in.

## 2020-01-12 NOTE — Telephone Encounter (Signed)
Patient requests refill of coumadin.  Routed to [place] clinic

## 2020-01-14 ENCOUNTER — Telehealth: Payer: Self-pay | Admitting: Internal Medicine

## 2020-01-14 NOTE — Telephone Encounter (Signed)
After speaking with Dr Renold Genta she said to let Lenette know that her mom needs to keep appointment with neurology and that they need to also speak with social worker to determine what their next steps are. Lenette said they are in the process of doing that right now and verbalized understanding.

## 2020-01-14 NOTE — Telephone Encounter (Signed)
Casey [name] [id number]  Casey called to say that her and her brother feel like their mom is no longer able to stay by herself, but her mom feels like she is fine, what do they need to do.

## 2020-01-15 NOTE — Telephone Encounter (Signed)
3rd message left for pt on both home and cell #s. Will await pt's return call to schedule INR check/refill warfarin.

## 2020-01-16 ENCOUNTER — Other Ambulatory Visit: Payer: Self-pay | Admitting: *Deleted

## 2020-01-16 NOTE — Patient Outreach (Signed)
[place] [facility]) Care Management  01/16/2020  CODA FILLER 06/24/1934 [id number]   Call placed to member's daughter to follow up on management of member's condition.  She state at this time she and her brother no longer feel that member is safe living alone.  She has completed the Medicaid application and submitted it but had to submit some revisions.  She has neighbors that visit her daily to provide support as well as some meals, member's son also visit frequently however there is no one staying with her throughout the day/night.  Member's son will be having foot surgery soon and will not be able to care for member, neighbors will try to continue support.  Advised of option to have support come into the home, services would have to be paid for out of pocket until Medicaid is in place.  Encouraged to reach out to CSW, contact information provided.    Member has appointments with coumadin clinic on 8/9, with cardiology on 8/23, and with neurology on 9/7.  Per chart, member was in need of coumadin refill, she confirms this has been taken care of.  She denies any urgent concerns at this time, main concern is to get member more support, either in the home or at ALF.  Will provide CSW update and follow up with member within the next 2 weeks.  Goals Addressed              This Visit's Progress   .  Daughter stated: I want her to continue taking care of herself (pt-stated)   Not on track     [place] (see longtitudinal plan of care for additional care plan information)   Current Barriers:  Marland Kitchen Knowledge deficit related to basic heart failure pathophysiology and self care management . Knowledge Deficits related to heart failure medications . Literacy Barriers . Cognitive Deficits  Case Manager Clinical Goal(s):  Marland Kitchen Over the next 31 days, patient will verbalize understanding of Heart Failure Action Plan and when to call doctor . Over the next 28 days, patient will take  all Heart Failure mediations as prescribed . Over the next 28 days, patient will weigh daily and record (notifying MD of 3 lb weight gain over night or 5 lb in a week)  Interventions:  . Basic overview and discussion of pathophysiology of Heart Failure reviewed  . Provided verbal education on low sodium diet . Reviewed Heart Failure Action Plan in depth and provided written copy . Assessed need for readable accurate scales in home . Discussed importance of daily weight and advised patient to weigh and record daily  Patient Self Care Activities:  . Takes Heart Failure Medications as prescribed . Weighs daily and record (notifying MD of 3 lb weight gain over night or 5 lb in a week)         Valente David, Therapist, sports, MSN [place] [phone]

## 2020-01-18 ENCOUNTER — Inpatient Hospital Stay (HOSPITAL_COMMUNITY): Payer: Medicare Other

## 2020-01-18 ENCOUNTER — Encounter (HOSPITAL_COMMUNITY): Payer: Self-pay

## 2020-01-18 ENCOUNTER — Other Ambulatory Visit: Payer: Self-pay

## 2020-01-18 ENCOUNTER — Inpatient Hospital Stay (HOSPITAL_COMMUNITY)
Admission: EM | Admit: 2020-01-18 | Discharge: 2020-01-24 | DRG: 682 | Disposition: A | Discharge status: Hospice | Payer: Medicare Other | Attending: Internal Medicine | Admitting: Internal Medicine

## 2020-01-18 ENCOUNTER — Emergency Department (HOSPITAL_COMMUNITY): Payer: Medicare Other

## 2020-01-18 DIAGNOSIS — I480 Paroxysmal atrial fibrillation: Secondary | ICD-10-CM | POA: Diagnosis present

## 2020-01-18 DIAGNOSIS — R5383 Other fatigue: Secondary | ICD-10-CM | POA: Diagnosis not present

## 2020-01-18 DIAGNOSIS — I10 Essential (primary) hypertension: Secondary | ICD-10-CM | POA: Diagnosis not present

## 2020-01-18 DIAGNOSIS — I959 Hypotension, unspecified: Secondary | ICD-10-CM | POA: Diagnosis not present

## 2020-01-18 DIAGNOSIS — Z7901 Long term (current) use of anticoagulants: Secondary | ICD-10-CM | POA: Diagnosis not present

## 2020-01-18 DIAGNOSIS — M2550 Pain in unspecified joint: Secondary | ICD-10-CM | POA: Diagnosis not present

## 2020-01-18 DIAGNOSIS — Z789 Other specified health status: Secondary | ICD-10-CM | POA: Diagnosis not present

## 2020-01-18 DIAGNOSIS — Z90710 Acquired absence of both cervix and uterus: Secondary | ICD-10-CM

## 2020-01-18 DIAGNOSIS — I130 Hypertensive heart and chronic kidney disease with heart failure and stage 1 through stage 4 chronic kidney disease, or unspecified chronic kidney disease: Secondary | ICD-10-CM | POA: Diagnosis not present

## 2020-01-18 DIAGNOSIS — I428 Other cardiomyopathies: Secondary | ICD-10-CM | POA: Diagnosis not present

## 2020-01-18 DIAGNOSIS — M109 Gout, unspecified: Secondary | ICD-10-CM | POA: Diagnosis present

## 2020-01-18 DIAGNOSIS — I4891 Unspecified atrial fibrillation: Secondary | ICD-10-CM | POA: Diagnosis not present

## 2020-01-18 DIAGNOSIS — Z515 Encounter for palliative care: Secondary | ICD-10-CM | POA: Diagnosis not present

## 2020-01-18 DIAGNOSIS — Z8679 Personal history of other diseases of the circulatory system: Secondary | ICD-10-CM | POA: Diagnosis not present

## 2020-01-18 DIAGNOSIS — R531 Weakness: Secondary | ICD-10-CM

## 2020-01-18 DIAGNOSIS — R7989 Other specified abnormal findings of blood chemistry: Secondary | ICD-10-CM | POA: Diagnosis not present

## 2020-01-18 DIAGNOSIS — E039 Hypothyroidism, unspecified: Secondary | ICD-10-CM | POA: Diagnosis present

## 2020-01-18 DIAGNOSIS — R5381 Other malaise: Secondary | ICD-10-CM | POA: Diagnosis not present

## 2020-01-18 DIAGNOSIS — I5022 Chronic systolic (congestive) heart failure: Secondary | ICD-10-CM | POA: Diagnosis not present

## 2020-01-18 DIAGNOSIS — G9341 Metabolic encephalopathy: Secondary | ICD-10-CM | POA: Diagnosis present

## 2020-01-18 DIAGNOSIS — Z95810 Presence of automatic (implantable) cardiac defibrillator: Secondary | ICD-10-CM

## 2020-01-18 DIAGNOSIS — E872 Acidosis: Secondary | ICD-10-CM | POA: Diagnosis present

## 2020-01-18 DIAGNOSIS — Z7189 Other specified counseling: Secondary | ICD-10-CM | POA: Diagnosis not present

## 2020-01-18 DIAGNOSIS — N183 Chronic kidney disease, stage 3 unspecified: Secondary | ICD-10-CM

## 2020-01-18 DIAGNOSIS — G9340 Encephalopathy, unspecified: Secondary | ICD-10-CM | POA: Diagnosis not present

## 2020-01-18 DIAGNOSIS — N184 Chronic kidney disease, stage 4 (severe): Secondary | ICD-10-CM | POA: Diagnosis not present

## 2020-01-18 DIAGNOSIS — N19 Unspecified kidney failure: Secondary | ICD-10-CM | POA: Diagnosis not present

## 2020-01-18 DIAGNOSIS — Z66 Do not resuscitate: Secondary | ICD-10-CM

## 2020-01-18 DIAGNOSIS — E875 Hyperkalemia: Secondary | ICD-10-CM | POA: Diagnosis present

## 2020-01-18 DIAGNOSIS — I255 Ischemic cardiomyopathy: Secondary | ICD-10-CM | POA: Diagnosis present

## 2020-01-18 DIAGNOSIS — N179 Acute kidney failure, unspecified: Secondary | ICD-10-CM | POA: Diagnosis not present

## 2020-01-18 DIAGNOSIS — Z20822 Contact with and (suspected) exposure to covid-19: Secondary | ICD-10-CM | POA: Diagnosis present

## 2020-01-18 DIAGNOSIS — F419 Anxiety disorder, unspecified: Secondary | ICD-10-CM | POA: Diagnosis present

## 2020-01-18 DIAGNOSIS — Z6831 Body mass index (BMI) 31.0-31.9, adult: Secondary | ICD-10-CM | POA: Diagnosis not present

## 2020-01-18 DIAGNOSIS — D696 Thrombocytopenia, unspecified: Secondary | ICD-10-CM | POA: Diagnosis present

## 2020-01-18 DIAGNOSIS — E785 Hyperlipidemia, unspecified: Secondary | ICD-10-CM | POA: Diagnosis present

## 2020-01-18 DIAGNOSIS — R392 Extrarenal uremia: Secondary | ICD-10-CM | POA: Diagnosis not present

## 2020-01-18 DIAGNOSIS — Z79890 Hormone replacement therapy (postmenopausal): Secondary | ICD-10-CM

## 2020-01-18 DIAGNOSIS — I509 Heart failure, unspecified: Secondary | ICD-10-CM | POA: Diagnosis not present

## 2020-01-18 DIAGNOSIS — E876 Hypokalemia: Secondary | ICD-10-CM | POA: Diagnosis not present

## 2020-01-18 DIAGNOSIS — I517 Cardiomegaly: Secondary | ICD-10-CM | POA: Diagnosis not present

## 2020-01-18 DIAGNOSIS — Z7401 Bed confinement status: Secondary | ICD-10-CM | POA: Diagnosis not present

## 2020-01-18 LAB — CBC WITH DIFFERENTIAL/PLATELET
Abs Immature Granulocytes: 0.08 10*3/uL — ABNORMAL HIGH (ref 0.00–0.07)
Basophils Absolute: 0 10*3/uL (ref 0.0–0.1)
Basophils Relative: 0 %
Eosinophils Absolute: 0 10*3/uL (ref 0.0–0.5)
Eosinophils Relative: 0 %
HCT: 47.5 % — ABNORMAL HIGH (ref 36.0–46.0)
Hemoglobin: 14.4 g/dL (ref 12.0–15.0)
Immature Granulocytes: 1 %
Lymphocytes Relative: 10 %
Lymphs Abs: 0.8 10*3/uL (ref 0.7–4.0)
MCH: 32.7 pg (ref 26.0–34.0)
MCHC: 30.3 g/dL (ref 30.0–36.0)
MCV: 108 fL — ABNORMAL HIGH (ref 80.0–100.0)
Monocytes Absolute: 0.8 10*3/uL (ref 0.1–1.0)
Monocytes Relative: 10 %
Neutro Abs: 6.1 10*3/uL (ref 1.7–7.7)
Neutrophils Relative %: 79 %
Platelets: 52 10*3/uL — ABNORMAL LOW (ref 150–400)
RBC: 4.4 MIL/uL (ref 3.87–5.11)
RDW: 17.7 % — ABNORMAL HIGH (ref 11.5–15.5)
WBC: 7.8 10*3/uL (ref 4.0–10.5)
nRBC: 0.5 % — ABNORMAL HIGH (ref 0.0–0.2)

## 2020-01-18 LAB — COMPREHENSIVE METABOLIC PANEL
ALT: 16 U/L (ref 0–44)
AST: 24 U/L (ref 15–41)
Albumin: 3.9 g/dL (ref 3.5–5.0)
Alkaline Phosphatase: 182 U/L — ABNORMAL HIGH (ref 38–126)
Anion gap: 18 — ABNORMAL HIGH (ref 5–15)
BUN: 119 mg/dL — ABNORMAL HIGH (ref 8–23)
CO2: 13 mmol/L — ABNORMAL LOW (ref 22–32)
Calcium: 10.1 mg/dL (ref 8.9–10.3)
Chloride: 108 mmol/L (ref 98–111)
Creatinine, Ser: 7.26 mg/dL — ABNORMAL HIGH (ref 0.44–1.00)
GFR calc Af Amer: 5 mL/min — ABNORMAL LOW (ref >60)
GFR calc non Af Amer: 5 mL/min — ABNORMAL LOW (ref >60)
Glucose, Bld: 78 mg/dL (ref 70–99)
Potassium: >7.5 mmol/L (ref 3.5–5.1)
Sodium: 139 mmol/L (ref 135–145)
Total Bilirubin: 5.1 mg/dL — ABNORMAL HIGH (ref 0.3–1.2)
Total Protein: 6.6 g/dL (ref 6.5–8.1)

## 2020-01-18 LAB — MAGNESIUM: Magnesium: 3.2 mg/dL — ABNORMAL HIGH (ref 1.7–2.4)

## 2020-01-18 LAB — TSH: TSH: 14.778 u[IU]/mL — ABNORMAL HIGH (ref 0.350–4.500)

## 2020-01-18 LAB — BRAIN NATRIURETIC PEPTIDE: B Natriuretic Peptide: >4500 pg/mL — ABNORMAL HIGH (ref 0.0–100.0)

## 2020-01-18 LAB — URINALYSIS, ROUTINE W REFLEX MICROSCOPIC
Bilirubin Urine: NEGATIVE
Glucose, UA: NEGATIVE mg/dL
Ketones, ur: NEGATIVE mg/dL
Nitrite: NEGATIVE
Protein, ur: 30 mg/dL — AB
Specific Gravity, Urine: 1.013 (ref 1.005–1.030)
pH: 5 (ref 5.0–8.0)

## 2020-01-18 LAB — PROTIME-INR
INR: 1.7 — ABNORMAL HIGH (ref 0.8–1.2)
Prothrombin Time: 18.9 seconds — ABNORMAL HIGH (ref 11.4–15.2)

## 2020-01-18 LAB — SARS CORONAVIRUS 2 BY RT PCR (HOSPITAL ORDER, PERFORMED IN ~~LOC~~ HOSPITAL LAB): SARS Coronavirus 2: NEGATIVE

## 2020-01-18 LAB — PHOSPHORUS: Phosphorus: 6.8 mg/dL — ABNORMAL HIGH (ref 2.5–4.6)

## 2020-01-18 LAB — POTASSIUM: Potassium: 5.9 mmol/L — ABNORMAL HIGH (ref 3.5–5.1)

## 2020-01-18 MED ORDER — DEXTROSE 50 % IV SOLN
50.0000 mL | Freq: Once | INTRAVENOUS | Status: AC
  Administered 2020-01-18: 50 mL via INTRAVENOUS
  Filled 2020-01-18: qty 50

## 2020-01-18 MED ORDER — LEVOTHYROXINE SODIUM 100 MCG PO TABS
100.0000 ug | ORAL_TABLET | Freq: Every day | ORAL | Status: DC
  Administered 2020-01-19 – 2020-01-21 (×3): 100 ug via ORAL
  Filled 2020-01-18 (×4): qty 1

## 2020-01-18 MED ORDER — WARFARIN SODIUM 2.5 MG PO TABS: 2.5000 mg | ORAL_TABLET | ORAL | Status: DC

## 2020-01-18 MED ORDER — HYDRALAZINE HCL 25 MG PO TABS: 25.0000 mg | ORAL_TABLET | Freq: Three times a day (TID) | ORAL | Status: DC

## 2020-01-18 MED ORDER — INSULIN ASPART 100 UNIT/ML IV SOLN
6.0000 [IU] | Freq: Once | INTRAVENOUS | Status: AC
  Administered 2020-01-18: 6 [IU] via INTRAVENOUS

## 2020-01-18 MED ORDER — SODIUM CHLORIDE 0.9 % IV BOLUS
250.0000 mL | Freq: Once | INTRAVENOUS | Status: AC
  Administered 2020-01-18: 250 mL via INTRAVENOUS

## 2020-01-18 MED ORDER — SODIUM POLYSTYRENE SULFONATE 15 GM/60ML PO SUSP
30.0000 g | Freq: Three times a day (TID) | ORAL | Status: AC
  Administered 2020-01-18 – 2020-01-19 (×2): 30 g via ORAL
  Filled 2020-01-18 (×2): qty 120

## 2020-01-18 MED ORDER — MECLIZINE HCL 25 MG PO TABS: 12.5000 mg | ORAL_TABLET | Freq: Two times a day (BID) | ORAL | Status: DC | PRN

## 2020-01-18 MED ORDER — ACETAMINOPHEN 325 MG PO TABS
650.0000 mg | ORAL_TABLET | Freq: Four times a day (QID) | ORAL | Status: DC | PRN
  Administered 2020-01-20 – 2020-01-24 (×2): 650 mg via ORAL
  Filled 2020-01-18 (×2): qty 2

## 2020-01-18 MED ORDER — ISOSORBIDE MONONITRATE ER 30 MG PO TB24: 30.0000 mg | ORAL_TABLET | Freq: Every day | ORAL | Status: DC

## 2020-01-18 MED ORDER — ACETAMINOPHEN 650 MG RE SUPP: 650.0000 mg | Freq: Four times a day (QID) | RECTAL | Status: DC | PRN

## 2020-01-18 MED ORDER — FENTANYL CITRATE (PF) 100 MCG/2ML IJ SOLN: 12.5000 ug | INTRAMUSCULAR | Status: DC | PRN

## 2020-01-18 MED ORDER — SENNA 8.6 MG PO TABS
1.0000 | ORAL_TABLET | Freq: Two times a day (BID) | ORAL | Status: DC
  Administered 2020-01-18 – 2020-01-21 (×5): 8.6 mg via ORAL
  Filled 2020-01-18 (×6): qty 1

## 2020-01-18 MED ORDER — ROSUVASTATIN CALCIUM 20 MG PO TABS
20.0000 mg | ORAL_TABLET | Freq: Every day | ORAL | Status: DC
  Administered 2020-01-19 – 2020-01-21 (×3): 20 mg via ORAL
  Filled 2020-01-18 (×3): qty 1

## 2020-01-18 MED ORDER — ALLOPURINOL 100 MG PO TABS
100.0000 mg | ORAL_TABLET | Freq: Every day | ORAL | Status: DC
  Administered 2020-01-18 – 2020-01-23 (×6): 100 mg via ORAL
  Filled 2020-01-18 (×6): qty 1

## 2020-01-18 MED ORDER — WARFARIN SODIUM 2.5 MG PO TABS
3.7500 mg | ORAL_TABLET | Freq: Once | ORAL | Status: AC
  Administered 2020-01-18: 3.75 mg via ORAL
  Filled 2020-01-18: qty 1

## 2020-01-18 MED ORDER — SODIUM CHLORIDE 0.9 % IV SOLN: INTRAVENOUS | Status: DC

## 2020-01-18 MED ORDER — CALCIUM GLUCONATE-NACL 1-0.675 GM/50ML-% IV SOLN
1.0000 g | Freq: Once | INTRAVENOUS | Status: AC
  Administered 2020-01-18: 1000 mg via INTRAVENOUS
  Filled 2020-01-18: qty 50

## 2020-01-18 MED ORDER — STERILE WATER FOR INJECTION IV SOLN
INTRAVENOUS | Status: DC
  Filled 2020-01-18: qty 150
  Filled 2020-01-18 (×2): qty 850
  Filled 2020-01-18: qty 150
  Filled 2020-01-18 (×2): qty 850

## 2020-01-18 MED ORDER — WARFARIN - PHARMACIST DOSING INPATIENT: Freq: Every day | Status: DC

## 2020-01-18 MED ORDER — SODIUM ZIRCONIUM CYCLOSILICATE 10 G PO PACK
10.0000 g | PACK | Freq: Three times a day (TID) | ORAL | Status: DC
  Administered 2020-01-19: 10 g via ORAL
  Filled 2020-01-18: qty 1

## 2020-01-18 MED ORDER — SODIUM BICARBONATE 650 MG PO TABS
650.0000 mg | ORAL_TABLET | Freq: Two times a day (BID) | ORAL | Status: DC
  Filled 2020-01-18: qty 1

## 2020-01-18 MED ORDER — METOPROLOL SUCCINATE ER 100 MG PO TB24: 100.0000 mg | ORAL_TABLET | Freq: Every day | ORAL | Status: DC

## 2020-01-18 NOTE — Consult Note (Signed)
Renal Service Consult Note [facility]  Casey [name] 01/18/2020 Sol Blazing Requesting Physician:  Dr Linda Hedges, M.   Reason for Consult:  Renal failure HPI: The patient is a 85 y.o. year-old w/ hx of CHF EF 15% / sp AICD, gout, CKD III, PUD, HTN, HL, hypothyroid, VT presented to ED brought from home by EMS for confusion, talking to dead relatives for the last month or so. Per EMS BP 's were 80/62 , in ED BP's 110/50.  Labs showed K+ > 7.5 and creat 7.2.  CXR showed CM, no edema / infiltrates. Pt is extremely difficult historian per ED MD.  We are asked to see for renal failure.   Pt is f/b Dr Marigene Ehlers of cardiology for severe DCM EF 10-15%.    Last admit here was in Jan 2019 for a/c syst CHF, PAF, cKD stage 4 and mitral regurg.  EF was 20% then. She was diuresed w/ IV lasix.   Last seen by cardiology in office on sept 2020. Minimally active, no vol excess, creat 2.6- 3.0.    ROS  denies CP  no joint pain   no HA  no blurry vision  no rash  no diarrhea  no nausea/ vomiting  no dysuria  no difficulty voiding  no change in urine color    Past Medical History  Past Medical History:  Diagnosis Date  . Atherosclerosis of aorta ([place])   . Biventricular ICD (implantable cardiac defibrillator) in place    medtronic   . CHF (congestive heart failure) ([place])   . Chronic kidney disease (CKD), stage III (moderate)   . Chronic systolic heart failure ([place])   . Gout   . History of peptic ulcer disease   . Hyperlipidemia   . Hypertension   . Hypothyroidism   . Lumbar disc disease   . Nonischemic cardiomyopathy ([place])   . Ventricular septal defect   . VT (ventricular tachycardia) ([place])    appropriate therapy via ICD 2013   Past Surgical History  Past Surgical History:  Procedure Laterality Date  . ABDOMINAL HYSTERECTOMY    . BIV ICD GENERTAOR CHANGE OUT N/A 04/16/2013   Procedure: BIV ICD GENERTAOR CHANGE OUT;  Surgeon: Deboraha Sprang, MD;  Location: [facility] CATH  LAB;  Service: Cardiovascular;  Laterality: N/A;  . CESAREAN SECTION    . FEMUR IM NAIL    . ORIF ANKLE FRACTURE Left 01/04/2018   Procedure: OPEN REDUCTION INTERNAL FIXATION (ORIF) ANKLE FRACTURE;  Surgeon: Meredith Pel, MD;  Location: [place];  Service: Orthopedics;  Laterality: Left;  . TEE WITHOUT CARDIOVERSION N/A 10/31/2017   Procedure: TRANSESOPHAGEAL ECHOCARDIOGRAM (TEE);  Surgeon: Larey Dresser, MD;  Location: [facility] ENDOSCOPY;  Service: Cardiovascular;  Laterality: N/A;   Family History  Family History  Problem Relation Age of Onset  . Heart failure Mother   . Cancer Father    Social History  reports that she has never smoked. She has never used smokeless tobacco. She reports that she does not drink alcohol and does not use drugs. Allergies  Allergies  Allergen Reactions  . Codeine Rash   Home medications Prior to Admission medications   Medication Sig Start Date End Date Taking? Authorizing Provider  allopurinol (ZYLOPRIM) 100 MG tablet Take 1 tablet by mouth once daily 04/01/19   Elby Showers, MD  EUTHYROX 75 MCG tablet TAKE 1 TABLET BY MOUTH ONCE DAILY BEFORE BREAKFAST Patient taking differently: Take 75 mcg by mouth daily before breakfast.  10/26/19   Baxley, Mary J, MD  hydrALAZINE (APRESOLINE) 25 MG tablet Take 1 tablet (25 mg total) by mouth 3 (three) times daily. 04/07/19   McLean, Dalton S, MD  isosorbide mononitrate (IMDUR) 30 MG 24 hr tablet Take 1 tablet (30 mg total) by mouth daily. 08/07/19   McLean, Dalton S, MD  meclizine (ANTIVERT) 12.5 MG tablet Take 1 tablet (12.5 mg total) by mouth every 12 (twelve) hours as needed for dizziness. 08/18/19   McLean, Dalton S, MD  metoprolol succinate (TOPROL-XL) 100 MG 24 hr tablet Take 1 tablet (100 mg total) by mouth daily. Take with or immediately following a meal. 08/18/19   McLean, Dalton S, MD  Multiple Vitamins-Calcium (ONE-A-DAY WOMENS FORMULA) TABS Take 1 tablet by mouth daily.      [provider]   potassium chloride SA (KLOR-CON) 20 MEQ tablet Take 1 tablet (20 mEq total) by mouth daily. 08/07/19   McLean, Dalton S, MD  rosuvastatin (CRESTOR) 20 MG tablet Take 1 tablet (20 mg total) by mouth daily. 02/25/19   McLean, Dalton S, MD  sodium bicarbonate 650 MG tablet Take 650 mg by mouth 2 (two) times daily.    [provider]  torsemide (DEMADEX) 20 MG tablet Take 2 tablets (40 mg total) by mouth daily. 08/05/19   McLean, Dalton S, MD  warfarin (COUMADIN) 2.5 MG tablet Take 1 tablet (2.5 mg total) by mouth every other day. 11/04/19   Baxley, Mary J, MD     Vitals:   01/18/20 1833 01/18/20 1845 01/18/20 1900 01/18/20 2000  BP: 107/71 112/63 110/72 113/72  Pulse: 65 66 71 71  Resp: (!) 22 20 (!) 21 (!) 21  Temp:      TempSrc:      SpO2: 97% 99% 99% 99%  Weight:      Height:       Exam Gen pt is HOH, but able to communicate Ox 3, not in distress No rash, cyanosis or gangrene Sclera anicteric, throat clear  No jvd or bruits Chest clear bilat to bases, no rales or wheezing RRR no MRG, displaced PMI, L upper chest ICD Abd soft distended due to obesity and/or ascites GU defer MS no joint effusions or deformity Ext no LE or UE edema, no wounds or ulcers Neuro is alert, Ox 3 , nf, faint asterixis, gen'd weakness     Date   Creat  eGFR   2009- 13   0.9- 1.10   2014-16  1.2- 1.6 25   2018   3.4  13- 36   Early 2019  2.10- 3.18 14- 26   Late 2019  2.68- 3.63 15- 19    2020   2.48- 3.45 14- 20, CKD IV   Jan-may 2021 2.30- 2.47 20-22, CKD IV    Today 01/18/20 7.26     Home meds:  - hydralazine 25 tid/ imdur 30 qd/ metoprolol xl 100 qd/ crestor 20/ torsemide 40 qd/ sod bicarb 650 bid/ Kdur 20 qd  - euthyrox 75 ug qd/ allopurinol 11 qd  - warfarin 2.5mg qod  - prn's/ vitamins/ supplements    Na 139 K > 7.5  CO2 13  BUN 119  Cr 7.26  Mg 3.2  Alb 3.9    Tbili 5.1  Tprot 6.6   BNP > 4500     WBC 7k  Hb 14  Plt 52     Assessment/ Plan: 1. AoCKD IV - hx solitary kidney,  b/l creat 2.5- 3, eGFR 15-20   ml/min. Creat here 7.2.  No vol excess, looks dry, BP's low per EMS. AKI likely due to dehydration/ hypotension/ hypoperfusion/ poor LVEF. Minimal signs of uremia. Due to severe comorbidities (heart disease), pt would not likely do well on dialysis. Per pmd notes also note that patient does not want chronic HD. Have d/w family (daughter), recommend medical Rx for hyperkalemia and AKI for now.  Will check serial K+'s and give alternating kayexalate/ lokelma as pt tolerates.  Get renal US r/o obstruction. Give gentle lVF's. Hold BP meds and diuretics, let BP come up.  2. Hyperkalemia - widened QRS is also present on all prior EKG's so cannot be sure this is due to high K+.   3. Thrombocytopenia - per primary team 4. Chronic syst CHF - EF 10-15% 5. SP AICD/ PPM/ hx VT - paced rhythm on EKG 6. HTN - hold BP meds for now, as above 7. Gout 8. Chronic anticoag / hx afib - on coumadin 9. Code status - spoke w/ dtr who wants to continue as full code at this time      Rob   MD 01/18/2020, 8:29 PM  Recent Labs  Lab 01/18/20 1704  WBC 7.8  HGB 14.4   Recent Labs  Lab 01/18/20 1704  K >7.5*  BUN 119*  CREATININE 7.26*  CALCIUM 10.1    

## 2020-01-18 NOTE — Progress Notes (Signed)
ANTICOAGULATION CONSULT NOTE - Initial Consult  Pharmacy Consult for warfarin Indication: atrial fibrillation  Allergies  Allergen Reactions  . Codeine Rash    Patient Measurements: Height: 5' (152.4 cm) Weight: 68 kg (150 lb) IBW/kg (Calculated) : 45.5  Vital Signs: Temp: 97.4 F (36.3 C) (08/08 1544) Temp Source: Oral (08/08 1544) BP: 114/56 (08/08 2030) Pulse Rate: 69 (08/08 2030)  Labs: Recent Labs    01/18/20 1704  HGB 14.4  HCT 47.5*  PLT 52*  LABPROT 18.9*  INR 1.7*  CREATININE 7.26*    Estimated Creatinine Clearance: 5.1 mL/min (A) (by C-G formula based on SCr of 7.26 mg/dL (H)).   Medical History: Past Medical History:  Diagnosis Date  . Atherosclerosis of aorta ([place])   . Biventricular ICD (implantable cardiac defibrillator) in place    medtronic   . CHF (congestive heart failure) ([place])   . Chronic kidney disease (CKD), stage III (moderate)   . Chronic systolic heart failure ([place])   . Gout   . History of peptic ulcer disease   . Hyperlipidemia   . Hypertension   . Hypothyroidism   . Lumbar disc disease   . Nonischemic cardiomyopathy ([place])   . Ventricular septal defect   . VT (ventricular tachycardia) ([place])    appropriate therapy via ICD 2013   Assessment: Casey [name] presenting with general fatigue and weakness.  Hx of Afib on warfarin, hx of non-compliance, was to follow with INR clinic however only made it to one appointment and missed her 7/20 appt.  Last dosing was 2.5mg  daily (except 3.75mg  Mon/Fri) and INR 1.7 on admission.    Goal of Therapy:  INR 2-3 Monitor platelets by anticoagulation protocol: Yes   Plan:  Give warfarin 3.75mg  PO x 1 tonight Daily INR, s/s bleeding  Bertis Ruddy, PharmD Clinical Pharmacist ED Pharmacist Phone # [phone] 01/18/2020 8:50 PM

## 2020-01-18 NOTE — ED Notes (Signed)
Scanned bladder, 9ml

## 2020-01-18 NOTE — ED Triage Notes (Addendum)
PT BIB [facility] from home. Pt did not want to come to the hospital but pt's son called and requested she should be checked out. Pt's son states pt is not answer appropriately at home. Pt is A&O x4 but is extremely hard of hearing. Pt denies any pain at this time. EMS stated pt was hypotensive when they first arrived 80/62. Pt's BP now 110/50. Pt's son states pt will talk about her dead sister being at this house or talking to her. Pt's son stated this started happening after the COVID shot a month ago. Pt has only had the first dose.

## 2020-01-18 NOTE — H&P (Signed)
History and Physical    KIP KAUTZMAN JME:[id number] DOB: Sep 10, 1934 DOA: 01/18/2020  PCP: Elby Showers, MD (Confirm with patient/family/NH records and if not entered, this has to be entered at [facility] point of entry) Patient coming from: home  I have personally briefly reviewed patient's old medical records in [place]  Chief Complaint: weak and confused  HPI: Casey [name] is a 85 y.o. female with medical history significant of profound ischemic cardiomyopathy with last EF 15-20%, CKD III, HTN, HLD, hypothyroidism. She was not feeling well this AM and her family convinced her to come to [facility] ED for further evaluation.   ED Course: Afebrile, BP 110/72, HR 71, RR 21, O2 sat 99%. Lab revealed K 7.5, Cr 7.5 with baseline of 2.4, BUN 119, INR 1.7, BNP >4,500, TSH 14.78. Covid is negative. CXR with cardiomegaly. EKG with SR, peaked T waves, wide QRS-ICD. In ED she was given 1 g Ca Gluconate, insulin and NS 250 cc bolus. Nephrology consult was called. [facility] called to admit patient for continued treatment  Review of Systems: As per HPI otherwise 10 point review of systems negative.    Past Medical History:  Diagnosis Date  . Atherosclerosis of aorta ([place])   . Biventricular ICD (implantable cardiac defibrillator) in place    medtronic   . CHF (congestive heart failure) ([place])   . Chronic kidney disease (CKD), stage III (moderate)   . Chronic systolic heart failure ([place])   . Gout   . History of peptic ulcer disease   . Hyperlipidemia   . Hypertension   . Hypothyroidism   . Lumbar disc disease   . Nonischemic cardiomyopathy ([place])   . Ventricular septal defect   . VT (ventricular tachycardia) ([place])    appropriate therapy via ICD 2013    Past Surgical History:  Procedure Laterality Date  . ABDOMINAL HYSTERECTOMY    . BIV ICD GENERTAOR CHANGE OUT N/A 04/16/2013   Procedure: BIV ICD GENERTAOR CHANGE OUT;  Surgeon: Deboraha Sprang, MD;  Location: [facility] CATH LAB;  Service:  Cardiovascular;  Laterality: N/A;  . CESAREAN SECTION    . FEMUR IM NAIL    . ORIF ANKLE FRACTURE Left 01/04/2018   Procedure: OPEN REDUCTION INTERNAL FIXATION (ORIF) ANKLE FRACTURE;  Surgeon: Meredith Pel, MD;  Location: [place];  Service: Orthopedics;  Laterality: Left;  . TEE WITHOUT CARDIOVERSION N/A 10/31/2017   Procedure: TRANSESOPHAGEAL ECHOCARDIOGRAM (TEE);  Surgeon: Larey Dresser, MD;  Location[facility] ENDOSCOPY;  Service: Cardiovascular;  Laterality: N/A;   Soc Hx -  Married, has one son, one daughter. Her husband is currently in SNF. They had been living independently and she continues to live alone, against the advice of her PCP. Family checks on her and are trying to arrange in home care.    reports that she has never smoked. She has never used smokeless tobacco. She reports that she does not drink alcohol and does not use drugs.  Allergies  Allergen Reactions  . Codeine Rash    Family History  Problem Relation Age of Onset  . Heart failure Mother   . Cancer Father    Unacceptable: Noncontributory, unremarkable, or negative. Acceptable: Family history reviewed and not pertinent (If you reviewed it)  Prior to Admission medications   Medication Sig Start Date End Date Taking? Authorizing Provider  allopurinol (ZYLOPRIM) 100 MG tablet Take 1 tablet by mouth once daily 04/01/19   Elby Showers, MD  EUTHYROX 75 MCG tablet TAKE  1 TABLET BY MOUTH ONCE DAILY BEFORE BREAKFAST Patient taking differently: Take 75 mcg by mouth daily before breakfast.  10/26/19   Baxley, Cresenciano Lick, MD  hydrALAZINE (APRESOLINE) 25 MG tablet Take 1 tablet (25 mg total) by mouth 3 (three) times daily. 04/07/19   Larey Dresser, MD  isosorbide mononitrate (IMDUR) 30 MG 24 hr tablet Take 1 tablet (30 mg total) by mouth daily. 08/07/19   Larey Dresser, MD  meclizine (ANTIVERT) 12.5 MG tablet Take 1 tablet (12.5 mg total) by mouth every 12 (twelve) hours as needed for dizziness. 08/18/19   Larey Dresser,  MD  metoprolol succinate (TOPROL-XL) 100 MG 24 hr tablet Take 1 tablet (100 mg total) by mouth daily. Take with or immediately following a meal. 08/18/19   Larey Dresser, MD  Multiple Vitamins-Calcium (ONE-A-DAY WOMENS FORMULA) TABS Take 1 tablet by mouth daily.      [provider]  potassium chloride SA (KLOR-CON) 20 MEQ tablet Take 1 tablet (20 mEq total) by mouth daily. 08/07/19   Larey Dresser, MD  rosuvastatin (CRESTOR) 20 MG tablet Take 1 tablet (20 mg total) by mouth daily. 02/25/19   Larey Dresser, MD  sodium bicarbonate 650 MG tablet Take 650 mg by mouth 2 (two) times daily.    [provider]  torsemide (DEMADEX) 20 MG tablet Take 2 tablets (40 mg total) by mouth daily. 08/05/19   Larey Dresser, MD  warfarin (COUMADIN) 2.5 MG tablet Take 1 tablet (2.5 mg total) by mouth every other day. Patient taking differently: Take 2.5 mg by mouth.  11/04/19   Elby Showers, MD    Physical Exam: Vitals:   01/18/20 1845 01/18/20 1900 01/18/20 2000 01/18/20 2030  BP: 112/63 110/72 113/72 (!) 114/56  Pulse: 66 71 71 69  Resp: 20 (!) 21 (!) 21 (!) 26  Temp:      TempSrc:      SpO2: 99% 99% 99% 100%  Weight:      Height:        Constitutional: NAD, calm, comfortable Vitals:   01/18/20 1845 01/18/20 1900 01/18/20 2000 01/18/20 2030  BP: 112/63 110/72 113/72 (!) 114/56  Pulse: 66 71 71 69  Resp: 20 (!) 21 (!) 21 (!) 26  Temp:      TempSrc:      SpO2: 99% 99% 99% 100%  Weight:      Height:       General: elderly woman who is easily awakened and states she feels fine. Eyes: PERRL, lids and conjunctivae normal, mild arcus senilis ENMT: Mucous membranes are moist.   Neck: normal, supple, no masses, no thyromegaly Respiratory: decreased BS, no wheezing, no rales. Normal respiratory effort. No accessory muscle use.  Cardiovascular: Regular rate and rhythm, no murmurs / rubs / gallops. 1+ LE extremity edema. Trace pedal pulses and both feet are cold to touch with  poor capillary refill. No carotid bruits.  Abdomen: no tenderness, no masses palpated. No hepatosplenomegaly. Bowel sounds positive.  Musculoskeletal: no clubbing / cyanosis. No joint deformity upper and lower extremities. Scars from surgical repair left ankle. Good ROM, no contractures. Decreased muscle tone.  Skin: no rashes, lesions, ulcers. No induration Neurologic: CN 2-12 grossly intact.   Psychiatric: Normal judgment and insight. Alert and oriented x 3. Normal mood.     Labs on Admission: I have personally reviewed following labs and imaging studies  CBC: Recent Labs  Lab 01/18/20 1704  WBC 7.8  NEUTROABS 6.1  HGB 14.4  HCT 47.5*  MCV 108.0*  PLT 52*   Basic Metabolic Panel: Recent Labs  Lab 01/18/20 1704  NA 139  K >7.5*  CL 108  CO2 13*  GLUCOSE 78  BUN 119*  CREATININE 7.26*  CALCIUM 10.1  MG 3.2*   GFR: Estimated Creatinine Clearance: 5.1 mL/min (A) (by C-G formula based on SCr of 7.26 mg/dL (H)). Liver Function Tests: Recent Labs  Lab 01/18/20 1704  AST 24  ALT 16  ALKPHOS 182*  BILITOT 5.1*  PROT 6.6  ALBUMIN 3.9   No results for input(s): LIPASE, AMYLASE in the last 168 hours. No results for input(s): AMMONIA in the last 168 hours. Coagulation Profile: Recent Labs  Lab 01/18/20 1704  INR 1.7*   Cardiac Enzymes: No results for input(s): CKTOTAL, CKMB, CKMBINDEX, TROPONINI in the last 168 hours. BNP (last 3 results) No results for input(s): PROBNP in the last 8760 hours. HbA1C: No results for input(s): HGBA1C in the last 72 hours. CBG: No results for input(s): GLUCAP in the last 168 hours. Lipid Profile: No results for input(s): CHOL, HDL, LDLCALC, TRIG, CHOLHDL, LDLDIRECT in the last 72 hours. Thyroid Function Tests: Recent Labs    01/18/20 1704  TSH 14.778*   Anemia Panel: No results for input(s): VITAMINB12, FOLATE, FERRITIN, TIBC, IRON, RETICCTPCT in the last 72 hours. Urine analysis:    Component Value Date/Time    COLORURINE AMBER (A) 01/18/2020 2007   APPEARANCEUR CLOUDY (A) 01/18/2020 2007   LABSPEC 1.013 01/18/2020 2007   PHURINE 5.0 01/18/2020 2007   GLUCOSEU NEGATIVE 01/18/2020 2007   HGBUR MODERATE (A) 01/18/2020 2007   BILIRUBINUR NEGATIVE 01/18/2020 2007   BILIRUBINUR NEG 12/16/2018 1024   KETONESUR NEGATIVE 01/18/2020 2007   PROTEINUR 30 (A) 01/18/2020 2007   UROBILINOGEN 0.2 12/16/2018 1024   UROBILINOGEN 1.0 12/17/2007 1501   NITRITE NEGATIVE 01/18/2020 2007   LEUKOCYTESUR MODERATE (A) 01/18/2020 2007    Radiological Exams on Admission: DG Chest Portable 1 View  Result Date: 01/18/2020 CLINICAL DATA:  Weakness EXAM: PORTABLE CHEST 1 VIEW COMPARISON:  07/07/2017 FINDINGS: Left AICD remains in place, unchanged. Cardiomegaly. No confluent opacities, effusions or overt edema. No acute bony abnormality. IMPRESSION: Cardiomegaly.  No active disease. Electronically Signed   By: Rolm Baptise M.D.   On: 01/18/2020 16:01    EKG: Independently reviewed. Sinus Rhythm, peaked T waves, Interventricular conduction delay with wide QRS.  Assessment/Plan Active Problems:   Chronic systolic CHF (congestive heart failure) (HCC)   Kidney disease, chronic, stage III (GFR 30-59 ml/min) (H[facility])   Hyperkalemia   AKI (acute kidney injury) ([place])   Nonischemic cardiomyopathy ([facility])   Paroxysmal atrial fibrillation (H[facility])   Hypothyroidism     1. Hyperkalemia - patient with acute hyperkalemia. Has been taking exogenous potassium. Has EKG changes. Was treated with CaGluconate, insulin and small fluid bolus in ED Plan Stepdown admission  F/u K level q 4 hours  Nephrology consult - Dr. Melvia Heaps. Daughter say mother did not want chronic HD but   Says OK for emergent HD if needed.   Exogenous K stopped.  2. CKD II with AKI - question of dehydration vs over diuresed. Plan Hold torsemide  Gently IV hydration - 1/2 NS 50 cc/hr  For 12 hours  F/u Bmet in AM  3. Cardiology - last echo 02/25/19 with EF 10-15% with  severe LV dilation. She has h/o PAF. PCM/defib in place. Follows with [facility]. BNP markedly elevated, but similar to last BNP in Epic from  2019. CXR w/o pulmonary edema. No evidence of decompensation on exam: lungs w/o rales, moderate JVD Plan Continue ome meds except for diuretic which is on hold  4. Hypothyroidism - patient has been on levothyroxine 75 mcg. TSH 14.78 Plan  Increase levothyroxine to 100 mcg daily  5. Code status -  Discussed with patient: she clearly states she would not want to be shocked or have her chest pumped or be on a machine to breath. Shared this information with daughter who accepts her mother's decision.  DVT prophylaxis: warfarin - will have pharmacy consult ) Code Status: DNR  Family Communication: spoke with Sammuel Hines - daughter  Disposition Plan: TBD  Consults called: Nephrology - Dr. Melvia Heaps  Admission status: stepdown    Adella Hare MD [facility] Pager [phone]  If 7PM-7AM, please contact night-coverage [email or web address] Password [facility]  01/18/2020, 8:50 PM

## 2020-01-18 NOTE — ED Provider Notes (Signed)
[facility] EMERGENCY DEPARTMENT Provider Note   CSN: [id number] Arrival date & time: 01/18/20  1506     History Chief Complaint  Patient presents with  . Hypotension    Casey [name] is a 85 y.o. female.  Patient with history of congestive heart failure, ICD, pacemaker, ulcer disease, hypothyroidism, nonischemic cardiomyopathy, ventricular tachycardia, lives at home with some family support presents with general fatigue and weakness.  Patient is extremely difficult historian and will not elaborate.  Patient is frustrated that she is here.  Patient denies any blood in the stools however she is hard of hearing.  No active chest pain or shortness of breath.  Unknown if any new medication.  Patient unsure if she was vaccinated.        Past Medical History:  Diagnosis Date  . Atherosclerosis of aorta ([place])   . Biventricular ICD (implantable cardiac defibrillator) in place    medtronic   . CHF (congestive heart failure) ([place])   . Chronic kidney disease (CKD), stage III (moderate)   . Chronic systolic heart failure ([place])   . Gout   . History of peptic ulcer disease   . Hyperlipidemia   . Hypertension   . Hypothyroidism   . Lumbar disc disease   . Nonischemic cardiomyopathy ([place])   . Ventricular septal defect   . VT (ventricular tachycardia) ([place])    appropriate therapy via ICD 2013    Patient Active Problem List   Diagnosis Date Noted  . Hyperkalemia 01/18/2020  . AKI (acute kidney injury) ([place]) 01/18/2020  . Encounter for therapeutic drug monitoring 12/05/2019  . Benign paroxysmal positional vertigo of left ear 02/14/2018  . Neurocognitive deficits 01/22/2018  . Closed displaced bimalleolar fracture of left lower leg   . History of peptic ulcer disease   . Atherosclerosis of aorta ([place])   . Bilateral hearing loss 12/09/2015  . Kidney disease, chronic, stage III (GFR 30-59 ml/min) (HCC)   . Ventricular septal defect (VSD)   . Long-term  (current) use of anticoagulants   . Hypothyroidism 03/11/2013  . Gout   . Vitamin D deficiency 03/06/2011  . Glaucoma   . Anxiety   . Spinal stenosis   . Biventricular implantable cardioverter-defibrillator in situ   . Nonischemic cardiomyopathy ([place])   . Paroxysmal atrial fibrillation (HCC)   . Hyperlipidemia   . Ventricular tachycardia   . Hypertensive heart disease   . Chronic systolic CHF (congestive heart failure) (HCC)     Past Surgical History:  Procedure Laterality Date  . ABDOMINAL HYSTERECTOMY    . BIV ICD GENERTAOR CHANGE OUT N/A 04/16/2013   Procedure: BIV ICD GENERTAOR CHANGE OUT;  Surgeon: Deboraha Sprang, MD;  Location: [facility] CATH LAB;  Service: Cardiovascular;  Laterality: N/A;  . CESAREAN SECTION    . FEMUR IM NAIL    . ORIF ANKLE FRACTURE Left 01/04/2018   Procedure: OPEN REDUCTION INTERNAL FIXATION (ORIF) ANKLE FRACTURE;  Surgeon: Meredith Pel, MD;  Location: [facility];  Service: Orthopedics;  Laterality: Left;  . TEE WITHOUT CARDIOVERSION N/A 10/31/2017   Procedure: TRANSESOPHAGEAL ECHOCARDIOGRAM (TEE);  Surgeon: Larey Dresser, MD;  Location: [facility] ENDOSCOPY;  Service: Cardiovascular;  Laterality: N/A;     OB History   No obstetric history on file.     Family History  Problem Relation Age of Onset  . Heart failure Mother   . Cancer Father     Social History   Tobacco Use  . Smoking status: Never Smoker  .  Smokeless tobacco: Never Used  Vaping Use  . Vaping Use: Never used  Substance Use Topics  . Alcohol use: No  . Drug use: No    Home Medications Prior to Admission medications   Medication Sig Start Date End Date Taking? Authorizing Provider  allopurinol (ZYLOPRIM) 100 MG tablet Take 1 tablet by mouth once daily 04/01/19   Elby Showers, MD  EUTHYROX 75 MCG tablet TAKE 1 TABLET BY MOUTH ONCE DAILY BEFORE BREAKFAST Patient taking differently: Take 75 mcg by mouth daily before breakfast.  10/26/19   Baxley, Cresenciano Lick, MD  hydrALAZINE (APRESOLINE)  25 MG tablet Take 1 tablet (25 mg total) by mouth 3 (three) times daily. 04/07/19   Larey Dresser, MD  isosorbide mononitrate (IMDUR) 30 MG 24 hr tablet Take 1 tablet (30 mg total) by mouth daily. 08/07/19   Larey Dresser, MD  meclizine (ANTIVERT) 12.5 MG tablet Take 1 tablet (12.5 mg total) by mouth every 12 (twelve) hours as needed for dizziness. 08/18/19   Larey Dresser, MD  metoprolol succinate (TOPROL-XL) 100 MG 24 hr tablet Take 1 tablet (100 mg total) by mouth daily. Take with or immediately following a meal. 08/18/19   Larey Dresser, MD  Multiple Vitamins-Calcium (ONE-A-DAY WOMENS FORMULA) TABS Take 1 tablet by mouth daily.      [provider]  potassium chloride SA (KLOR-CON) 20 MEQ tablet Take 1 tablet (20 mEq total) by mouth daily. 08/07/19   Larey Dresser, MD  rosuvastatin (CRESTOR) 20 MG tablet Take 1 tablet (20 mg total) by mouth daily. 02/25/19   Larey Dresser, MD  sodium bicarbonate 650 MG tablet Take 650 mg by mouth 2 (two) times daily.    [provider]  torsemide (DEMADEX) 20 MG tablet Take 2 tablets (40 mg total) by mouth daily. 08/05/19   Larey Dresser, MD  warfarin (COUMADIN) 2.5 MG tablet Take 1 tablet (2.5 mg total) by mouth every other day. Patient taking differently: Take 2.5 mg by mouth.  11/04/19   Baxley, Cresenciano Lick, MD    Allergies    Codeine  Review of Systems   Review of Systems  Unable to perform ROS: Other    Physical Exam Updated Vital Signs BP (!) 107/56   Pulse 72   Temp (!) 97.4 F (36.3 C) (Oral)   Resp 18   Ht 5' (1.524 m)   Wt 68 kg   SpO2 97%   BMI 29.29 kg/m   Physical Exam Vitals and nursing note reviewed.  Constitutional:      Appearance: She is well-developed.  HENT:     Head: Normocephalic and atraumatic.     Mouth/Throat:     Mouth: Mucous membranes are dry.  Eyes:     General:        Right eye: No discharge.        Left eye: No discharge.     Conjunctiva/sclera: Conjunctivae normal.  Neck:      Trachea: No tracheal deviation.  Cardiovascular:     Rate and Rhythm: Normal rate and regular rhythm.     Heart sounds: Murmur heard.   Pulmonary:     Effort: Pulmonary effort is normal.     Breath sounds: Normal breath sounds.  Abdominal:     General: There is no distension.     Palpations: Abdomen is soft.     Tenderness: There is no abdominal tenderness. There is no guarding.  Musculoskeletal:     Cervical  back: Normal range of motion and neck supple.  Skin:    General: Skin is warm.     Findings: No rash.  Neurological:     General: No focal deficit present.     Mental Status: She is alert and oriented to person, place, and time.     Comments: Patient has fatigue appearance and generally weak on exam. Patient needs assistance to sit up in bed.  Patient has equal strength bilateral upper and lower extremities no obvious arm drift or leg drift however she still weak in the legs she primarily bends her knees.  Horizontal eye movements intact pupils equal.  Psychiatric:        Mood and Affect: Affect is flat.     ED Results / Procedures / Treatments   Labs (all labs ordered are listed, but only abnormal results are displayed) Labs Reviewed  URINALYSIS, ROUTINE W REFLEX MICROSCOPIC - Abnormal; Notable for the following components:      Result Value   Color, Urine AMBER (*)    APPearance CLOUDY (*)    Hgb urine dipstick MODERATE (*)    Protein, ur 30 (*)    Leukocytes,Ua MODERATE (*)    Bacteria, UA RARE (*)    All other components within normal limits  COMPREHENSIVE METABOLIC PANEL - Abnormal; Notable for the following components:   Potassium >7.5 (*)    CO2 13 (*)    BUN 119 (*)    Creatinine, Ser 7.26 (*)    Alkaline Phosphatase 182 (*)    Total Bilirubin 5.1 (*)    GFR calc non Af Amer 5 (*)    GFR calc Af Amer 5 (*)    Anion gap 18 (*)    All other components within normal limits  CBC WITH DIFFERENTIAL/PLATELET - Abnormal; Notable for the following components:    HCT 47.5 (*)    MCV 108.0 (*)    RDW 17.7 (*)    Platelets 52 (*)    nRBC 0.5 (*)    Abs Immature Granulocytes 0.08 (*)    All other components within normal limits  PROTIME-INR - Abnormal; Notable for the following components:   Prothrombin Time 18.9 (*)    INR 1.7 (*)    All other components within normal limits  TSH - Abnormal; Notable for the following components:   TSH 14.778 (*)    All other components within normal limits  MAGNESIUM - Abnormal; Notable for the following components:   Magnesium 3.2 (*)    All other components within normal limits  BRAIN NATRIURETIC PEPTIDE - Abnormal; Notable for the following components:   B Natriuretic Peptide >4,500.0 (*)    All other components within normal limits  POTASSIUM - Abnormal; Notable for the following components:   Potassium 5.9 (*)    All other components within normal limits  PHOSPHORUS - Abnormal; Notable for the following components:   Phosphorus 6.8 (*)    All other components within normal limits  SARS CORONAVIRUS 2 BY RT PCR (HOSPITAL ORDER, [place] [facility])  URINE CULTURE  BASIC METABOLIC PANEL  CBC  PROTIME-INR  CREATININE, URINE, RANDOM  SODIUM, URINE, RANDOM  POTASSIUM    EKG EKG Interpretation  Date/Time:  Sunday January 18 2020 15:50:44 EDT Ventricular Rate:  63 PR Interval:    QRS Duration: 173 QT Interval:  534 QTC Calculation: 547 R Axis:   173 Text Interpretation: Pacer spikes Biatrial enlargement Nonspecific intraventricular conduction delay Confirmed by Elnora Morrison [id number])  on 01/18/2020 4:08:29 PM   Radiology US RENAL  Result Date: 01/18/2020 CLINICAL DATA:  Acute renal failure EXAM: RENAL / URINARY TRACT ULTRASOUND COMPLETE COMPARISON:  June 16, 2015 FINDINGS: Right Kidney: Nonvisualized. There is a small nodule seen within the expected location of the right kidney which could represent the adrenal gland measuring 1.8 x 1.1 x 1.5 cm Left Kidney: Renal measurements:  10.2 x 5.7 x 5.1 cm = volume: 153 mL. Echogenicity within normal limits. Mild cortical thinning is seen. An anechoic cyst seen within the midpole measuring 1.2 x 1.0 x 1.3 cm. Bladder: Appears normal for degree of bladder distention. Other: None. IMPRESSION: Nonvisualized right kidney. Simple renal cyst within the left kidney.  No hydronephrosis. Electronically Signed   By: Prudencio Pair M.D.   On: 01/18/2020 23:13   DG Chest Portable 1 View  Result Date: 01/18/2020 CLINICAL DATA:  Weakness EXAM: PORTABLE CHEST 1 VIEW COMPARISON:  07/07/2017 FINDINGS: Left AICD remains in place, unchanged. Cardiomegaly. No confluent opacities, effusions or overt edema. No acute bony abnormality. IMPRESSION: Cardiomegaly.  No active disease. Electronically Signed   By: Rolm Baptise M.D.   On: 01/18/2020 16:01    Procedures Ultrasound ED Peripheral IV (Provider)  Date/Time: 01/18/2020 5:36 PM Performed by: Elnora Morrison, MD Authorized by: Elnora Morrison, MD   Procedure details:    Indications: multiple failed IV attempts     Location:  Right AC   Angiocath:  18 G   Bedside Ultrasound Guided: Yes     Images: archived     Patient tolerated procedure without complications: Yes    .Critical Care Performed by: Elnora Morrison, MD Authorized by: Elnora Morrison, MD   Critical care provider statement:    Critical care time (minutes):  40   Critical care start time:  01/18/2020 5:20 PM   Critical care end time:  01/18/2020 6:00 PM   Critical care time was exclusive of:  Separately billable procedures and treating other patients and teaching time   Critical care was necessary to treat or prevent imminent or life-threatening deterioration of the following conditions:  Metabolic crisis   Critical care was time spent personally by me on the following activities:  Discussions with consultants, evaluation of patient's response to treatment, examination of patient, ordering and performing treatments and interventions, ordering  and review of laboratory studies, ordering and review of radiographic studies, pulse oximetry, re-evaluation of patient's condition, obtaining history from patient or surrogate and review of old charts   (including critical care time)  Medications Ordered in ED Medications  allopurinol (ZYLOPRIM) tablet 100 mg (100 mg Oral Given 01/18/20 2313)  rosuvastatin (CRESTOR) tablet 20 mg (has no administration in time range)  levothyroxine (SYNTHROID) tablet 100 mcg (has no administration in time range)  meclizine (ANTIVERT) tablet 12.5 mg (has no administration in time range)  acetaminophen (TYLENOL) tablet 650 mg (has no administration in time range)    Or  acetaminophen (TYLENOL) suppository 650 mg (has no administration in time range)  fentaNYL (SUBLIMAZE) injection 12.5-50 mcg (has no administration in time range)  senna (SENOKOT) tablet 8.6 mg (8.6 mg Oral Given 01/18/20 2212)  Warfarin - Pharmacist Dosing Inpatient (has no administration in time range)  sodium bicarbonate 150 mEq in sterile water 1,000 mL infusion ( Intravenous New Bag/Given 01/18/20 2211)  sodium polystyrene (KAYEXALATE) 15 GM/60ML suspension 30 g (30 g Oral Given 01/18/20 2212)  sodium zirconium cyclosilicate (LOKELMA) packet 10 g (has no administration in time range)  dextrose 50 % solution  50 mL (50 mLs Intravenous Given 01/18/20 1835)  sodium chloride 0.9 % bolus 250 mL (0 mLs Intravenous Stopped 01/18/20 1903)  insulin aspart (novoLOG) injection 6 Units (6 Units Intravenous Given 01/18/20 1839)  calcium gluconate 1 g/ 50 mL sodium chloride IVPB (0 g Intravenous Stopped 01/18/20 1950)  warfarin (COUMADIN) tablet 3.75 mg (3.75 mg Oral Given 01/18/20 2314)    ED Course  I have reviewed the triage vital signs and the nursing notes.  Pertinent labs & imaging results that were available during my care of the patient were reviewed by me and considered in my medical decision making (see chart for details).    MDM Rules/Calculators/A&P                          Patient with complicated cardiac and medical history presents with general weakness.  Unable to get details from patient she is very poor historian.  Reviewed medical records and patient out reach as care plan entry on January 16, 2020 that details heart failure action plan, support at home, family concerned patient is not safe living alone.  Last echo result reviewed showing ejection fraction 15% 2020.  Plan to check blood work check for signs of anemia, abnormal thyroid, electrolyte abnormalities, BNP changes, urinalysis to check for weakness or sign of UTI. EKG pending. CXR ordered.   COVID test sent.  Reported low bp 80/62 for EMS, now 110/50 in ED.  Ultrasound-guided IV placed, blood work obtained by myself.  Oral fluids will be given.  Chest x-ray reviewed no acute abnormalities.  EKG no acute ischemic findings.  Plan for and out cath if unable to give urine sample.  Blood work reviewed showing potassium 7.5, acute renal injury with creatinine sevens which is new for patient.  Small IV fluid bolus, dextrose and insulin, calcium ordered immediately.  Anion gap of 18 secondary to renal failure.  Paged nephrology for consult and hospitalist to admit.  Spoke with hospitalist for admission, treatment protocol started.  Updated family. Spoke with nephrology on call for consult.   Final Clinical Impression(s) / ED Diagnoses Final diagnoses:  General weakness  Uremia  Hyperkalemia  Acute renal failure, unspecified acute renal failure type ([place])  Acute encephalopathy  Abnormal TSH    Rx / DC Orders ED Discharge Orders    None       Elnora Morrison, MD 01/18/20 2347

## 2020-01-19 ENCOUNTER — Other Ambulatory Visit: Payer: Self-pay

## 2020-01-19 DIAGNOSIS — R531 Weakness: Secondary | ICD-10-CM

## 2020-01-19 DIAGNOSIS — I480 Paroxysmal atrial fibrillation: Secondary | ICD-10-CM

## 2020-01-19 DIAGNOSIS — N19 Unspecified kidney failure: Secondary | ICD-10-CM | POA: Diagnosis not present

## 2020-01-19 DIAGNOSIS — E875 Hyperkalemia: Secondary | ICD-10-CM | POA: Diagnosis not present

## 2020-01-19 DIAGNOSIS — N179 Acute kidney failure, unspecified: Secondary | ICD-10-CM | POA: Diagnosis not present

## 2020-01-19 LAB — BASIC METABOLIC PANEL
Anion gap: 19 — ABNORMAL HIGH (ref 5–15)
Anion gap: 19 — ABNORMAL HIGH (ref 5–15)
BUN: 116 mg/dL — ABNORMAL HIGH (ref 8–23)
BUN: 113 mg/dL — ABNORMAL HIGH (ref 8–23)
CO2: 12 mmol/L — ABNORMAL LOW (ref 22–32)
CO2: 14 mmol/L — ABNORMAL LOW (ref 22–32)
Calcium: 9.8 mg/dL (ref 8.9–10.3)
Calcium: 9.2 mg/dL (ref 8.9–10.3)
Chloride: 111 mmol/L (ref 98–111)
Chloride: 111 mmol/L (ref 98–111)
Creatinine, Ser: 7.12 mg/dL — ABNORMAL HIGH (ref 0.44–1.00)
Creatinine, Ser: 6.56 mg/dL — ABNORMAL HIGH (ref 0.44–1.00)
GFR calc Af Amer: 6 mL/min — ABNORMAL LOW (ref >60)
GFR calc Af Amer: 6 mL/min — ABNORMAL LOW (ref >60)
GFR calc non Af Amer: 5 mL/min — ABNORMAL LOW (ref >60)
GFR calc non Af Amer: 5 mL/min — ABNORMAL LOW (ref >60)
Glucose, Bld: 73 mg/dL (ref 70–99)
Glucose, Bld: 81 mg/dL (ref 70–99)
Potassium: 6.3 mmol/L (ref 3.5–5.1)
Potassium: 5.7 mmol/L — ABNORMAL HIGH (ref 3.5–5.1)
Sodium: 142 mmol/L (ref 135–145)
Sodium: 144 mmol/L (ref 135–145)

## 2020-01-19 LAB — CREATININE, URINE, RANDOM: Creatinine, Urine: 180.68 mg/dL

## 2020-01-19 LAB — CBC
HCT: 45.5 % (ref 36.0–46.0)
Hemoglobin: 14 g/dL (ref 12.0–15.0)
MCH: 33.4 pg (ref 26.0–34.0)
MCHC: 30.8 g/dL (ref 30.0–36.0)
MCV: 108.6 fL — ABNORMAL HIGH (ref 80.0–100.0)
Platelets: 46 10*3/uL — ABNORMAL LOW (ref 150–400)
RBC: 4.19 MIL/uL (ref 3.87–5.11)
RDW: 17.8 % — ABNORMAL HIGH (ref 11.5–15.5)
WBC: 7.9 10*3/uL (ref 4.0–10.5)
nRBC: 0.5 % — ABNORMAL HIGH (ref 0.0–0.2)

## 2020-01-19 LAB — POTASSIUM: Potassium: 6.4 mmol/L (ref 3.5–5.1)

## 2020-01-19 LAB — PROTIME-INR
INR: 1.7 — ABNORMAL HIGH (ref 0.8–1.2)
Prothrombin Time: 19.2 seconds — ABNORMAL HIGH (ref 11.4–15.2)

## 2020-01-19 LAB — SODIUM, URINE, RANDOM: Sodium, Ur: <10 mmol/L

## 2020-01-19 MED ORDER — DEXTROSE 50 % IV SOLN
25.0000 mL | Freq: Once | INTRAVENOUS | Status: AC
  Administered 2020-01-19: 25 mL via INTRAVENOUS
  Filled 2020-01-19: qty 50

## 2020-01-19 MED ORDER — WARFARIN SODIUM 2.5 MG PO TABS
3.7500 mg | ORAL_TABLET | Freq: Once | ORAL | Status: AC
  Administered 2020-01-19: 3.75 mg via ORAL
  Filled 2020-01-19: qty 1

## 2020-01-19 MED ORDER — INSULIN ASPART 100 UNIT/ML ~~LOC~~ SOLN
5.0000 [IU] | Freq: Once | SUBCUTANEOUS | Status: AC
  Administered 2020-01-19: 5 [IU] via SUBCUTANEOUS

## 2020-01-19 MED ORDER — CALCIUM GLUCONATE-NACL 1-0.675 GM/50ML-% IV SOLN
1.0000 g | Freq: Once | INTRAVENOUS | Status: AC
  Administered 2020-01-19: 1000 mg via INTRAVENOUS
  Filled 2020-01-19: qty 50

## 2020-01-19 MED ORDER — DEXTROSE 50 % IV SOLN
1.0000 | Freq: Once | INTRAVENOUS | Status: AC
  Administered 2020-01-19: 50 mL via INTRAVENOUS
  Filled 2020-01-19: qty 50

## 2020-01-19 MED ORDER — SODIUM CHLORIDE 0.9 % IV BOLUS
500.0000 mL | Freq: Once | INTRAVENOUS | Status: AC
  Administered 2020-01-19: 500 mL via INTRAVENOUS

## 2020-01-19 MED ORDER — METOPROLOL SUCCINATE ER 25 MG PO TB24
25.0000 mg | ORAL_TABLET | Freq: Every day | ORAL | Status: DC
  Administered 2020-01-19 – 2020-01-21 (×3): 25 mg via ORAL
  Filled 2020-01-19 (×3): qty 1

## 2020-01-19 MED ORDER — INSULIN ASPART 100 UNIT/ML IV SOLN
6.0000 [IU] | Freq: Once | INTRAVENOUS | Status: AC
  Administered 2020-01-19: 6 [IU] via INTRAVENOUS

## 2020-01-19 MED ORDER — SODIUM ZIRCONIUM CYCLOSILICATE 10 G PO PACK
10.0000 g | PACK | Freq: Three times a day (TID) | ORAL | Status: AC
  Administered 2020-01-19 (×3): 10 g via ORAL
  Filled 2020-01-19 (×3): qty 1

## 2020-01-19 NOTE — Plan of Care (Signed)
  Problem: Urinary Elimination: Goal: Signs and symptoms of infection will decrease Outcome: Progressing   Problem: Clinical Measurements: Goal: Ability to maintain clinical measurements within normal limits will improve Outcome: Progressing Goal: Will remain free from infection Outcome: Progressing Goal: Cardiovascular complication will be avoided Outcome: Progressing   Problem: Nutrition: Goal: Adequate nutrition will be maintained Outcome: Progressing   Problem: Safety: Goal: Ability to remain free from injury will improve Outcome: Progressing

## 2020-01-19 NOTE — Progress Notes (Signed)
Patient had a 8 beat run [facility], MD notified and order received for patient. Will continue to monitor patient.

## 2020-01-19 NOTE — Progress Notes (Signed)
   01/19/20 1200  Clinical Encounter Type  Visited With Patient not available  Visit Type Initial  Referral From Nurse  Consult/Referral To Chaplain   Chaplain responded to request for AD. Pt was sleeping and not able to be awoken. Chaplains will try again at another time.   Chaplain Resident, Evelene Croon, M Div [phone] on-call pager

## 2020-01-19 NOTE — Progress Notes (Addendum)
Re-paged on call provider, see orders.

## 2020-01-19 NOTE — Progress Notes (Signed)
[facility] Progress Note   85 y.o. year-old w/ hx of CHF EF 15% / sp AICD, gout, CKD III BL cr 2.6-3,  HTN, HL, hypothyroid, VT p/w  confusion, talking to dead relatives for the last month or so. Per EMS BP 's were 80/62 , in ED BP's 110/50.  Labs showed K+ > 7.5 and creat 7.2.  CXR showed CM, no edema / infiltrates.  Pt is f/b Dr Marigene Ehlers of cardiology for severe DCM EF 10-15%.  Last admit here was in Jan 2019 for a/c syst CHF, PAF, cKD stage 4 and mitral regurg.  EF was 20% then. She was diuresed w/ IV lasix.    Assessment/ Plan:   1. AoCKD IV - hx solitary kidney, b/l creat 2.5- 3, eGFR 15-20 ml/min. Creat here 7.2.  No vol excess, looks dry, BP's low per EMS. AKI likely due to  hypotension/ hypoperfusion/ poor LVEF. Minimal signs of uremia. Due to severe comorbidities (heart disease), pt would not likely do well on dialysis. Per pmd notes also note that patient does not want chronic HD. Medical tx  for hyperkalemia and AKI for now. She was as low as 71.8kg on 08/18/2019 - Renal US -> LK no e/o   obstruction.   - Hold BP meds and diuretics, let BP come up -> better this am.  - Alternating Kayexalate and Lokelma + HCO3. Some edema in LE. - No absolute indication for RRT and not a good long term candidate + pt does not want chronic HD. I am concerned if we start RRT she will not come off. 2. Hyperkalemia - widened QRS is also present on all prior EKG's so cannot be sure this is due to high K+.   3. Thrombocytopenia - per primary team 4. Chronic syst CHF - EF 10-15% 5. SP AICD/ PPM/ hx VT - paced rhythm on EKG 6. HTN - hold BP meds for now, as above 7. Gout 8. Chronic anticoag / hx afib - on coumadin 9. Code status - dtr who wants to continue as full code at this time  Subjective:   Denies f/c/dyspnea.   Objective:   BP 106/66 (BP Location: Left Arm)   Pulse 74   Temp 97.9 F (36.6 C)   Resp 15   Ht 5' (1.524 m)   Wt 69.9 kg   SpO2 98%   BMI 30.10 kg/m    Intake/Output Summary (Last 24 hours) at 01/19/2020 8527 Last data filed at 01/19/2020 7824 Gross per 24 hour  Intake 2610 ml  Output 0 ml  Net 2610 ml   Weight change:   Physical Exam: Gen pt is HOH, but able to communicate Ox 3, not in distress No rash, cyanosis or gangrene Chest clear bilat to bases, no rales or wheezing RRR no MRG, displaced PMI, L upper chest ICD Abd soft distended due to obesity and/or ascites GU defer MS no joint effusions or deformity Ext tr  LE,   no wounds or ulcers Neuro is alert, Ox 3 , nf, faint asterixis, gen'd weakness  Imaging: US RENAL  Result Date: 01/18/2020 CLINICAL DATA:  Acute renal failure EXAM: RENAL / URINARY TRACT ULTRASOUND COMPLETE COMPARISON:  June 16, 2015 FINDINGS: Right Kidney: Nonvisualized. There is a small nodule seen within the expected location of the right kidney which could represent the adrenal gland measuring 1.8 x 1.1 x 1.5 cm Left Kidney: Renal measurements: 10.2 x 5.7 x 5.1 cm = volume: 153 mL. Echogenicity within normal limits. Mild  cortical thinning is seen. An anechoic cyst seen within the midpole measuring 1.2 x 1.0 x 1.3 cm. Bladder: Appears normal for degree of bladder distention. Other: None. IMPRESSION: Nonvisualized right kidney. Simple renal cyst within the left kidney.  No hydronephrosis. Electronically Signed   By: Prudencio Pair M.D.   On: 01/18/2020 23:13   DG Chest Portable 1 View  Result Date: 01/18/2020 CLINICAL DATA:  Weakness EXAM: PORTABLE CHEST 1 VIEW COMPARISON:  07/07/2017 FINDINGS: Left AICD remains in place, unchanged. Cardiomegaly. No confluent opacities, effusions or overt edema. No acute bony abnormality. IMPRESSION: Cardiomegaly.  No active disease. Electronically Signed   By: Rolm Baptise M.D.   On: 01/18/2020 16:01    Labs: BMET Recent Labs  Lab 01/18/20 1704 01/18/20 2147 01/19/20 0144  NA 139  --  142  K >7.5* 5.9* 6.3*  CL 108  --  111  CO2 13*  --  12*  GLUCOSE 78  --  73  BUN 119*   --  116*  CREATININE 7.26*  --  7.12*  CALCIUM 10.1  --  9.8  PHOS  --  6.8*  --    CBC Recent Labs  Lab 01/18/20 1704 01/19/20 0144  WBC 7.8 7.9  NEUTROABS 6.1  --   HGB 14.4 14.0  HCT 47.5* 45.5  MCV 108.0* 108.6*  PLT 52* 46*    Medications:    . allopurinol  100 mg Oral Daily  . levothyroxine  100 mcg Oral Q0600  . rosuvastatin  20 mg Oral Daily  . senna  1 tablet Oral BID  . sodium zirconium cyclosilicate  10 g Oral TID  . Warfarin - Pharmacist Dosing Inpatient   Does not apply q1600      Otelia Santee, MD 01/19/2020, 7:02 AM

## 2020-01-19 NOTE — Progress Notes (Signed)
PROGRESS NOTE    Casey [name]  EXH:[id number] DOB: [birth year] DOA: 01/18/2020 PCP: Casey Showers, MD  Brief Narrative: Casey [name] is an 85/F with past history of severe non ischemic cardiomyopathy, EF of 10-15%, with AICD, moderate to severe mitral regurgitation history of V. tach, stage IV chronic kidney disease, hypothyroidism, hypertension, dyslipidemia, peptic ulcer disease was brought to the ED by EMS for worsening confusion.  Per report history of hallucinations for the last 1 to 2 months, she lives by herself but has a neighbor checking in on her, providing food etc.  -She is also very hard of hearing, when EMS arrived blood pressure was 80/62, labs noted potassium greater than 7.5 and creatinine of 7.2, chest x-ray noted cardiomegaly, no edema -Nephrology consulted, she is not felt to be a good dialysis candidate and patient indicated in the past that she would not want hemodialysis   Assessment & Plan:   AKI on CKD stage IV Severe hyperkalemia Severe metabolic acidosis Suspected uremia -Baseline creatinine is in the 2.5-3 range, on admission creatinine is 7.2 with potassium greater than 7.5 and severe metabolic acidosis -Nephrology following, -Continue Lokelma, repeat calcium gluconate, insulin and dextrose today -Clinically looks dry, currently on sodium bicarbonate infusion, renal ultrasound with solitary kidney and no hydronephrosis, no urine output documented yet, monitor -Not felt to be a dialysis candidate -I called patient's daughter this morning, she agreed to DNR as discussed with Dr. Linda [name] last night, reiterated that nephrology as well as medical team feel that with her severe cardiomyopathy, EF of 10%, advanced age,  debility she would not be appropriate for long-term dialysis, I brought up consideration of hospice to daughter today -Palliative consult requested  Acute on chronic thrombocytopenia -Platelet count in the 50 K range, this was 84K 3 months  ago, and prior to that in the 90-100k range, monitor -Etiology unclear, could be an underlying bone marrow problem, also check peripheral smear  Chronic systolic CHF, EF 38-10% Moderate to severe mitral regurgitation Status post AICD -Toprol held on admission will resume at a lower dose, diuretics on hold  Paroxysmal atrial fibrillation -Coumadin restarted on admission, restart Toprol at low-dose  History of hypothyroidism -TSH is very high, unable to tell me if she was taking any Synthroid  DVT prophylaxis: On Coumadin Code Status: DNR Family Communication: Called and updated daughter Casey [name] Disposition Plan:  Status is: Inpatient  Remains inpatient appropriate because:Inpatient level of care appropriate due to severity of illness   Dispo: The patient is from: Home              Anticipated d/c is to: to be determined              Anticipated d/c date is: > 3 days              Patient currently is not medically stable to d/c.   Consultants:   Renal  Palliative care   Procedures:   Antimicrobials:    Subjective: -Remains somnolent but arousable, laying in bed, awakens to voice mumbles a few words  Objective: Vitals:   01/19/20 0049 01/19/20 0451 01/19/20 0455 01/19/20 0913  BP: 104/65  106/66 105/71  Pulse: 76  74 82  Resp: 18  15 18   Temp: (!) 97 F (36.1 C)  97.9 F (36.6 C) (!) 97.4 F (36.3 C)  TempSrc: Rectal   Oral  SpO2: 95%  98% 100%  Weight:  69.9 kg    Height:  Intake/Output Summary (Last 24 hours) at 01/19/2020 0948 Last data filed at 01/19/2020 0511 Gross per 24 hour  Intake 2610 ml  Output 0 ml  Net 2610 ml   Filed Weights   01/18/20 1547 01/18/20 2144 01/19/20 0451  Weight: 68 kg 69.1 kg 69.9 kg    Examination:  General exam: Chronically ill elderly female, laying in bed, arousable but somnolent, confused and hard of hearing HEENT: No JVD CVS: S1-S2, regular rhythm Lungs: Diminished breath sounds the bases otherwise  clear Abdomen: Soft, nontender, bowel sounds present Extremities: No edema  Neuro: Disoriented drowsy and confused, moves all extremities no localizing signs  Psych: Unable to assess  Data Reviewed:   CBC: Recent Labs  Lab 01/18/20 1704 01/19/20 0144  WBC 7.8 7.9  NEUTROABS 6.1  --   HGB 14.4 14.0  HCT 47.5* 45.5  MCV 108.0* 108.6*  PLT 52* 46*   Basic Metabolic Panel: Recent Labs  Lab 01/18/20 1704 01/18/20 2147 01/19/20 0144 01/19/20 0608  NA 139  --  142  --   K >7.5* 5.9* 6.3* 6.4*  CL 108  --  111  --   CO2 13*  --  12*  --   GLUCOSE 78  --  73  --   BUN 119*  --  116*  --   CREATININE 7.26*  --  7.12*  --   CALCIUM 10.1  --  9.8  --   MG 3.2*  --   --   --   PHOS  --  6.8*  --   --    GFR: Estimated Creatinine Clearance: 5.2 mL/min (A) (by C-G formula based on SCr of 7.12 mg/dL (H)). Liver Function Tests: Recent Labs  Lab 01/18/20 1704  AST 24  ALT 16  ALKPHOS 182*  BILITOT 5.1*  PROT 6.6  ALBUMIN 3.9   No results for input(s): LIPASE, AMYLASE in the last 168 hours. No results for input(s): AMMONIA in the last 168 hours. Coagulation Profile: Recent Labs  Lab 01/18/20 1704 01/19/20 0144  INR 1.7* 1.7*   Cardiac Enzymes: No results for input(s): CKTOTAL, CKMB, CKMBINDEX, TROPONINI in the last 168 hours. BNP (last 3 results) No results for input(s): PROBNP in the last 8760 hours. HbA1C: No results for input(s): HGBA1C in the last 72 hours. CBG: No results for input(s): GLUCAP in the last 168 hours. Lipid Profile: No results for input(s): CHOL, HDL, LDLCALC, TRIG, CHOLHDL, LDLDIRECT in the last 72 hours. Thyroid Function Tests: Recent Labs    01/18/20 1704  TSH 14.778*   Anemia Panel: No results for input(s): VITAMINB12, FOLATE, FERRITIN, TIBC, IRON, RETICCTPCT in the last 72 hours. Urine analysis:    Component Value Date/Time   COLORURINE AMBER (A) 01/18/2020 2007   APPEARANCEUR CLOUDY (A) 01/18/2020 2007   LABSPEC 1.013 01/18/2020  2007   PHURINE 5.0 01/18/2020 2007   GLUCOSEU NEGATIVE 01/18/2020 2007   HGBUR MODERATE (A) 01/18/2020 2007   BILIRUBINUR NEGATIVE 01/18/2020 2007   BILIRUBINUR NEG 12/16/2018 1024   KETONESUR NEGATIVE 01/18/2020 2007   PROTEINUR 30 (A) 01/18/2020 2007   UROBILINOGEN 0.2 12/16/2018 1024   UROBILINOGEN 1.0 12/17/2007 1501   NITRITE NEGATIVE 01/18/2020 2007   LEUKOCYTESUR MODERATE (A) 01/18/2020 2007   Sepsis Labs: @LABRCNTIP (procalcitonin:4,lacticidven:4)  ) Recent Results (from the past 240 hour(s))  SARS Coronavirus 2 by RT PCR (hospital order, performed in [place] hospital lab) Nasopharyngeal Nasopharyngeal Swab     Status: None   Collection Time: 01/18/20  3:38 PM  Specimen: Nasopharyngeal Swab  Result Value Ref Range Status   SARS Coronavirus 2 NEGATIVE NEGATIVE Final    Comment: (NOTE) SARS-CoV-2 target nucleic acids are NOT DETECTED.  The SARS-CoV-2 RNA is generally detectable in upper and lower respiratory specimens during the acute phase of infection. The lowest concentration of SARS-CoV-2 viral copies this assay can detect is 250 copies / mL. A negative result does not preclude SARS-CoV-2 infection and should not be used as the sole basis for treatment or other patient management decisions.  A negative result may occur with improper specimen collection / handling, submission of specimen other than nasopharyngeal swab, presence of viral mutation(s) within the areas targeted by this assay, and inadequate number of viral copies (<250 copies / mL). A negative result must be combined with clinical observations, patient history, and epidemiological information.  Fact Sheet for Patients:   StrictlyIdeas.no  Fact Sheet for Healthcare Providers: BankingDealers.co.za  This test is not yet approved or  cleared by the [place] FDA and has been authorized for detection and/or diagnosis of SARS-CoV-2 by FDA under an  Emergency Use Authorization (EUA).  This EUA will remain in effect (meaning this test can be used) for the duration of the COVID-19 declaration under Section 564(b)(1) of the Act, 21 U.S.C. section 360bbb-3(b)(1), unless the authorization is terminated or revoked sooner.  Performed at [facility], [place] [address] [place], [place] [address]          Radiology Studies: US RENAL  Result Date: 01/18/2020 CLINICAL DATA:  Acute renal failure EXAM: RENAL / URINARY TRACT ULTRASOUND COMPLETE COMPARISON:  June 16, 2015 FINDINGS: Right Kidney: Nonvisualized. There is a small nodule seen within the expected location of the right kidney which could represent the adrenal gland measuring 1.8 x 1.1 x 1.5 cm Left Kidney: Renal measurements: 10.2 x 5.7 x 5.1 cm = volume: 153 mL. Echogenicity within normal limits. Mild cortical thinning is seen. An anechoic cyst seen within the midpole measuring 1.2 x 1.0 x 1.3 cm. Bladder: Appears normal for degree of bladder distention. Other: None. IMPRESSION: Nonvisualized right kidney. Simple renal cyst within the left kidney.  No hydronephrosis. Electronically Signed   By: Prudencio Pair M.D.   On: 01/18/2020 23:13   DG Chest Portable 1 View  Result Date: 01/18/2020 CLINICAL DATA:  Weakness EXAM: PORTABLE CHEST 1 VIEW COMPARISON:  07/07/2017 FINDINGS: Left AICD remains in place, unchanged. Cardiomegaly. No confluent opacities, effusions or overt edema. No acute bony abnormality. IMPRESSION: Cardiomegaly.  No active disease. Electronically Signed   By: Rolm Baptise M.D.   On: 01/18/2020 16:01        Scheduled Meds: . allopurinol  100 mg Oral Daily  . levothyroxine  100 mcg Oral Q0600  . rosuvastatin  20 mg Oral Daily  . senna  1 tablet Oral BID  . sodium zirconium cyclosilicate  10 g Oral TID  . Warfarin - Pharmacist Dosing Inpatient   Does not apply q1600   Continuous Infusions: . calcium gluconate    .  sodium bicarbonate (isotonic) infusion in  sterile water 75 mL/hr at 01/18/20 2211     LOS: 1 day    Time spent: 80min  Domenic Polite, MD [facility]  01/19/2020, 9:48 AM

## 2020-01-19 NOTE — Consult Note (Signed)
   [facility] Inpatient Consult   01/19/2020  YOLETTE HASTINGS Jun 16, 1934 [id number]  [place] Patient: Medicare NextGen and Gertha Calkin, MD  Patient is currently active with [facility] for chronic disease management services.  Patient has been engaged by a [facility] team member.  Our community based plan of care has focused on disease management and community resource support.    Patient will receive a post hospital call and will be evaluated for assessments and disease process education.    Plan:  Will follow with the  Inpatient Transition Of Care [TOC] team member and medical record to make aware that [facility] following. Of note, [facility] Care Management services does not replace or interfere with any services that are needed or arranged by inpatient [facility] care management team.  For additional questions or referrals please contact:  Natividad Brood, RN BSN [facility] Liaison  [phone] business mobile phone Toll free office [phone]  Fax number: [phone] [email or web address]

## 2020-01-19 NOTE — Progress Notes (Addendum)
Critical lab value resulted, see results.  On call provider paged.  Patient A&Ox1 and refusing to take med per [facility] at this time.

## 2020-01-19 NOTE — Progress Notes (Signed)
ANTICOAGULATION CONSULT NOTE - Initial Consult  Pharmacy Consult for warfarin Indication: atrial fibrillation  Allergies  Allergen Reactions  . Codeine Rash    Patient Measurements: Height: 5' (152.4 cm) Weight: 69.9 kg (154 lb 1.6 oz) IBW/kg (Calculated) : 45.5  Vital Signs: Temp: 97.4 F (36.3 C) (08/09 0913) Temp Source: Oral (08/09 0913) BP: 105/71 (08/09 0913) Pulse Rate: 82 (08/09 0913)  Labs: Recent Labs    01/18/20 1704 01/19/20 0144  HGB 14.4 14.0  HCT 47.5* 45.5  PLT 52* 46*  LABPROT 18.9* 19.2*  INR 1.7* 1.7*  CREATININE 7.26* 7.12*    Estimated Creatinine Clearance: 5.2 mL/min (A) (by C-G formula based on SCr of 7.12 mg/dL (H)).  Assessment: Casey [name] presenting with general fatigue and weakness.  Hx of Afib on warfarin, hx of non-compliance, was to follow with INR clinic however only made it to one appointment and missed her 7/20 appt.  Last dosing was 2.5mg  daily (except 3.75mg  Mon/Fri) and INR 1.7 on admission.    INR this am 1.7  Goal of Therapy:  INR 2-3 Monitor platelets by anticoagulation protocol: Yes   Plan:  Give warfarin 3.75mg  PO x 1 tonight Daily INR, s/s bleeding  Barth Kirks, PharmD, BCPS, BCCCP Clinical Pharmacist [phone]  Please check AMION for all [place] numbers  01/19/2020 9:58 AM

## 2020-01-20 ENCOUNTER — Telehealth: Payer: Self-pay | Admitting: Emergency Medicine

## 2020-01-20 DIAGNOSIS — E875 Hyperkalemia: Secondary | ICD-10-CM | POA: Diagnosis not present

## 2020-01-20 DIAGNOSIS — I5022 Chronic systolic (congestive) heart failure: Secondary | ICD-10-CM | POA: Diagnosis not present

## 2020-01-20 DIAGNOSIS — Z66 Do not resuscitate: Secondary | ICD-10-CM

## 2020-01-20 DIAGNOSIS — N184 Chronic kidney disease, stage 4 (severe): Secondary | ICD-10-CM

## 2020-01-20 DIAGNOSIS — Z7189 Other specified counseling: Secondary | ICD-10-CM

## 2020-01-20 DIAGNOSIS — N179 Acute kidney failure, unspecified: Secondary | ICD-10-CM | POA: Diagnosis not present

## 2020-01-20 DIAGNOSIS — Z515 Encounter for palliative care: Secondary | ICD-10-CM

## 2020-01-20 DIAGNOSIS — N19 Unspecified kidney failure: Secondary | ICD-10-CM | POA: Diagnosis not present

## 2020-01-20 DIAGNOSIS — R7989 Other specified abnormal findings of blood chemistry: Secondary | ICD-10-CM | POA: Diagnosis not present

## 2020-01-20 DIAGNOSIS — R531 Weakness: Secondary | ICD-10-CM | POA: Diagnosis not present

## 2020-01-20 LAB — URINE CULTURE: Culture: >=100000 — AB

## 2020-01-20 LAB — BASIC METABOLIC PANEL
Anion gap: 15 (ref 5–15)
BUN: 97 mg/dL — ABNORMAL HIGH (ref 8–23)
CO2: 20 mmol/L — ABNORMAL LOW (ref 22–32)
Calcium: 8.4 mg/dL — ABNORMAL LOW (ref 8.9–10.3)
Chloride: 104 mmol/L (ref 98–111)
Creatinine, Ser: 5.26 mg/dL — ABNORMAL HIGH (ref 0.44–1.00)
GFR calc Af Amer: 8 mL/min — ABNORMAL LOW (ref >60)
GFR calc non Af Amer: 7 mL/min — ABNORMAL LOW (ref >60)
Glucose, Bld: 148 mg/dL — ABNORMAL HIGH (ref 70–99)
Potassium: 3.2 mmol/L — ABNORMAL LOW (ref 3.5–5.1)
Sodium: 139 mmol/L (ref 135–145)

## 2020-01-20 LAB — CBC WITH DIFFERENTIAL/PLATELET
Abs Immature Granulocytes: 0.03 10*3/uL (ref 0.00–0.07)
Basophils Absolute: 0 10*3/uL (ref 0.0–0.1)
Basophils Relative: 0 %
Eosinophils Absolute: 0.2 10*3/uL (ref 0.0–0.5)
Eosinophils Relative: 3 %
HCT: 38.3 % (ref 36.0–46.0)
Hemoglobin: 12.2 g/dL (ref 12.0–15.0)
Immature Granulocytes: 0 %
Lymphocytes Relative: 10 %
Lymphs Abs: 0.7 10*3/uL (ref 0.7–4.0)
MCH: 33.6 pg (ref 26.0–34.0)
MCHC: 31.9 g/dL (ref 30.0–36.0)
MCV: 105.5 fL — ABNORMAL HIGH (ref 80.0–100.0)
Monocytes Absolute: 0.8 10*3/uL (ref 0.1–1.0)
Monocytes Relative: 12 %
Neutro Abs: 5.3 10*3/uL (ref 1.7–7.7)
Neutrophils Relative %: 75 %
Platelets: 42 10*3/uL — ABNORMAL LOW (ref 150–400)
RBC: 3.63 MIL/uL — ABNORMAL LOW (ref 3.87–5.11)
RDW: 17.9 % — ABNORMAL HIGH (ref 11.5–15.5)
WBC: 7.1 10*3/uL (ref 4.0–10.5)
nRBC: 0.3 % — ABNORMAL HIGH (ref 0.0–0.2)

## 2020-01-20 LAB — MAGNESIUM: Magnesium: 2.7 mg/dL — ABNORMAL HIGH (ref 1.7–2.4)

## 2020-01-20 LAB — PROTIME-INR
INR: 1.8 — ABNORMAL HIGH (ref 0.8–1.2)
Prothrombin Time: 20.1 seconds — ABNORMAL HIGH (ref 11.4–15.2)

## 2020-01-20 LAB — POTASSIUM: Potassium: 3.5 mmol/L (ref 3.5–5.1)

## 2020-01-20 MED ORDER — WARFARIN SODIUM 2.5 MG PO TABS: 3.7500 mg | ORAL_TABLET | Freq: Once | ORAL | Status: DC

## 2020-01-20 MED ORDER — WARFARIN SODIUM 2.5 MG PO TABS
2.5000 mg | ORAL_TABLET | Freq: Once | ORAL | Status: AC
  Administered 2020-01-20: 2.5 mg via ORAL
  Filled 2020-01-20: qty 1

## 2020-01-20 NOTE — Progress Notes (Signed)
PROGRESS NOTE    Casey [name]  ZCH:[id number] DOB: 05-13-1935 DOA: 01/18/2020 PCP: Elby Showers, MD  Brief Narrative: Casey [name] is an 85/F with past history of severe non ischemic cardiomyopathy, EF of 10-15%, with AICD, moderate to severe mitral regurgitation history of V. tach, stage IV chronic kidney disease, hypothyroidism, hypertension, dyslipidemia, peptic ulcer disease was brought to the ED by EMS for worsening confusion.  Per report history of hallucinations for the last 1 to 2 months, she lives by herself but has a neighbor checking in on her, providing food etc.  -She is also very hard of hearing, when EMS arrived blood pressure was 80/62, labs noted potassium greater than 7.5 and creatinine of 7.2, chest x-ray noted cardiomegaly, no edema -Nephrology consulted, she is not felt to be a good dialysis candidate and patient indicated in the past that she would not want hemodialysis, she was started on medical treatment for hyperkalemia and sodium bicarbonate fluids -Palliative team consulted, renal has signed off and noted that she is not a dialysis candidate   Assessment & Plan:   AKI on CKD stage IV Severe hyperkalemia Severe metabolic acidosis Suspected uremia -Baseline creatinine is in the 2.5-3 range, on admission creatinine is 7.2 with potassium greater than 7.5 and severe metabolic acidosis -Nephrology consulted -Hypercalcemia treated with multiple doses of calcium gluconate/insulin, dextrose and Lokelma  -Clinically was dry on admission hence started on sodium bicarbonate infusion, renal ultrasound noted solitary kidney and no hydronephrosis -Poor urine output, with oliguria at this time -Per nephrology she is not a dialysis candidate, palliative care recommended -Daughter lives out of state, I called her and reiterated that nephrology as well as medical team feel that with her severe cardiomyopathy, EF of 10%, advanced age,  debility she would not be  appropriate for long-term dialysis, recommended consideration of hospice -Palliative consult requested -Plan for family meeting today  Acute on chronic thrombocytopenia -Platelet count in the 50 K range, this was 84K 3 months ago, and prior to that in the 90-100k range, monitor -Etiology unclear, could be an underlying bone marrow problem, limited value in further work-up and set in the setting of above  Chronic systolic CHF, EF 28-78% Moderate to severe mitral regurgitation Status post AICD -Toprol held on admission, restarted at a lower dose, diuretics on hold  Paroxysmal atrial fibrillation -Coumadin restarted on admission, restarted Toprol at low-dose  History of hypothyroidism -TSH is very high, unable to tell me if she was taking any Synthroid -restarted   DVT prophylaxis: On Coumadin Code Status: DNR Family Communication: Called and updated daughter [name] 8/9 Disposition Plan:  Status is: Inpatient  Remains inpatient appropriate because:Inpatient level of care appropriate due to severity of illness   Dispo: The patient is from: Home              Anticipated d/c is to: to be determined              Anticipated d/c date is: > 3 days              Patient currently is not medically stable to d/c.   Consultants:   Renal  Palliative care   Procedures:   Antimicrobials:    Subjective: -More awake today but confused  Objective: Vitals:   01/19/20 2129 01/19/20 2344 01/20/20 0429 01/20/20 0915  BP: (!) 101/59 99/69 (!) 92/50 98/68  Pulse: 86 84 88 93  Resp: 18 17 18 20   Temp: 98.6 F (37 C) 98.2  F (36.8 C) 98.3 F (36.8 C) (!) 97.5 F (36.4 C)  TempSrc:  Oral Oral Oral  SpO2: 94% 96% 93% 92%  Weight:      Height:        Intake/Output Summary (Last 24 hours) at 01/20/2020 1133 Last data filed at 01/20/2020 0945 Gross per 24 hour  Intake 3429.6 ml  Output 550 ml  Net 2879.6 ml   Filed Weights   01/18/20 1547 01/18/20 2144 01/19/20 0451    Weight: 68 kg 69.1 kg 69.9 kg    Examination:  General exam: Chronically ill elderly female, sitting up in bed, awake today but confused, oriented to self only HEENT: No JVD CVS: S1-S2, regular rate rhythm Lungs: Diminished breath sounds the bases, otherwise clear Abdomen: Soft, nontender, bowel sounds present Extremities: No edema  Neuro: Confused and angry, moves all extremities no localizing signs Psych: Unable to assess  Data Reviewed:   CBC: Recent Labs  Lab 01/18/20 1704 01/19/20 0144 01/20/20 0121  WBC 7.8 7.9 7.1  NEUTROABS 6.1  --  5.3  HGB 14.4 14.0 12.2  HCT 47.5* 45.5 38.3  MCV 108.0* 108.6* 105.5*  PLT 52* 46* 42*   Basic Metabolic Panel: Recent Labs  Lab 01/18/20 1704 01/18/20 1704 01/18/20 2147 01/19/20 0144 01/19/20 0608 01/19/20 1337 01/20/20 0121  NA 139  --   --  142  --  144  --   K >7.5*   < > 5.9* 6.3* 6.4* 5.7* 3.5  CL 108  --   --  111  --  111  --   CO2 13*  --   --  12*  --  14*  --   GLUCOSE 78  --   --  73  --  81  --   BUN 119*  --   --  116*  --  113*  --   CREATININE 7.26*  --   --  7.12*  --  6.56*  --   CALCIUM 10.1  --   --  9.8  --  9.2  --   MG 3.2*  --   --   --   --   --  2.7*  PHOS  --   --  6.8*  --   --   --   --    < > = values in this interval not displayed.   GFR: Estimated Creatinine Clearance: 5.7 mL/min (A) (by C-G formula based on SCr of 6.56 mg/dL (H)). Liver Function Tests: Recent Labs  Lab 01/18/20 1704  AST 24  ALT 16  ALKPHOS 182*  BILITOT 5.1*  PROT 6.6  ALBUMIN 3.9   No results for input(s): LIPASE, AMYLASE in the last 168 hours. No results for input(s): AMMONIA in the last 168 hours. Coagulation Profile: Recent Labs  Lab 01/18/20 1704 01/19/20 0144 01/20/20 0121  INR 1.7* 1.7* 1.8*   Cardiac Enzymes: No results for input(s): CKTOTAL, CKMB, CKMBINDEX, TROPONINI in the last 168 hours. BNP (last 3 results) No results for input(s): PROBNP in the last 8760 hours. HbA1C: No results for  input(s): HGBA1C in the last 72 hours. CBG: No results for input(s): GLUCAP in the last 168 hours. Lipid Profile: No results for input(s): CHOL, HDL, LDLCALC, TRIG, CHOLHDL, LDLDIRECT in the last 72 hours. Thyroid Function Tests: Recent Labs    01/18/20 1704  TSH 14.778*   Anemia Panel: No results for input(s): VITAMINB12, FOLATE, FERRITIN, TIBC, IRON, RETICCTPCT in the last 72 hours. Urine analysis:  Component Value Date/Time   COLORURINE AMBER (A) 01-25-20 2007   APPEARANCEUR CLOUDY (A) Jan 25, 2020 2007   LABSPEC 1.013 2020-01-25 2007   PHURINE 5.0 Jan 25, 2020 2007   GLUCOSEU NEGATIVE 01-25-20 2007   HGBUR MODERATE (A) 01/25/2020 2007   BILIRUBINUR NEGATIVE Jan 25, 2020 2007   BILIRUBINUR NEG 12/16/2018 1024   KETONESUR NEGATIVE January 25, 2020 2007   PROTEINUR 30 (A) 01/25/2020 2007   UROBILINOGEN 0.2 12/16/2018 1024   UROBILINOGEN 1.0 12/17/2007 1501   NITRITE NEGATIVE 25-Jan-2020 2007   LEUKOCYTESUR MODERATE (A) 25-Jan-2020 2007   Sepsis Labs: @LABRCNTIP (procalcitonin:4,lacticidven:4)  ) Recent Results (from the past 240 hour(s))  SARS Coronavirus 2 by RT PCR (hospital order, performed in [place] hospital lab) Nasopharyngeal Nasopharyngeal Swab     Status: None   Collection Time: 01-25-2020  3:38 PM   Specimen: Nasopharyngeal Swab  Result Value Ref Range Status   SARS Coronavirus 2 NEGATIVE NEGATIVE Final    Comment: (NOTE) SARS-CoV-2 target nucleic acids are NOT DETECTED.  The SARS-CoV-2 RNA is generally detectable in upper and lower respiratory specimens during the acute phase of infection. The lowest concentration of SARS-CoV-2 viral copies this assay can detect is 250 copies / mL. A negative result does not preclude SARS-CoV-2 infection and should not be used as the sole basis for treatment or other patient management decisions.  A negative result may occur with improper specimen collection / handling, submission of specimen other than nasopharyngeal swab,  presence of viral mutation(s) within the areas targeted by this assay, and inadequate number of viral copies (<250 copies / mL). A negative result must be combined with clinical observations, patient history, and epidemiological information.  Fact Sheet for Patients:   StrictlyIdeas.no  Fact Sheet for Healthcare Providers: BankingDealers.co.za  This test is not yet approved or  cleared by the [place] FDA and has been authorized for detection and/or diagnosis of SARS-CoV-2 by FDA under an Emergency Use Authorization (EUA).  This EUA will remain in effect (meaning this test can be used) for the duration of the COVID-19 declaration under Section 564(b)(1) of the Act, 21 U.S.C. section 360bbb-3(b)(1), unless the authorization is terminated or revoked sooner.  Performed at [facility], [place] [address], [place], [place] [address]   Urine culture     Status: Abnormal   Collection Time: 2020-01-25  8:07 PM   Specimen: Urine, Random  Result Value Ref Range Status   Specimen Description URINE, RANDOM  Final   Special Requests   Final    NONE Performed at [facility], [place] [address] [place],  [address]    Culture >=100,000 COLONIES/mL ESCHERICHIA COLI (A)  Final   Report Status 01/20/2020 FINAL  Final   Organism ID, Bacteria ESCHERICHIA COLI (A)  Final      Susceptibility   Escherichia coli - MIC*    AMPICILLIN <=2 SENSITIVE Sensitive     CEFAZOLIN <=4 SENSITIVE Sensitive     CEFTRIAXONE <=0.25 SENSITIVE Sensitive     CIPROFLOXACIN <=0.25 SENSITIVE Sensitive     GENTAMICIN <=1 SENSITIVE Sensitive     IMIPENEM <=0.25 SENSITIVE Sensitive     NITROFURANTOIN <=16 SENSITIVE Sensitive     TRIMETH/SULFA <=20 SENSITIVE Sensitive     AMPICILLIN/SULBACTAM <=2 SENSITIVE Sensitive     PIP/TAZO <=4 SENSITIVE Sensitive     * >=100,000 COLONIES/mL ESCHERICHIA COLI         Radiology Studies: US RENAL  Result Date:  January 25, 2020 CLINICAL DATA:  Acute renal failure EXAM: RENAL / URINARY  TRACT ULTRASOUND COMPLETE COMPARISON:  June 16, 2015 FINDINGS: Right Kidney: Nonvisualized. There is a small nodule seen within the expected location of the right kidney which could represent the adrenal gland measuring 1.8 x 1.1 x 1.5 cm Left Kidney: Renal measurements: 10.2 x 5.7 x 5.1 cm = volume: 153 mL. Echogenicity within normal limits. Mild cortical thinning is seen. An anechoic cyst seen within the midpole measuring 1.2 x 1.0 x 1.3 cm. Bladder: Appears normal for degree of bladder distention. Other: None. IMPRESSION: Nonvisualized right kidney. Simple renal cyst within the left kidney.  No hydronephrosis. Electronically Signed   By: Prudencio Pair M.D.   On: 01/18/2020 23:13   DG Chest Portable 1 View  Result Date: 01/18/2020 CLINICAL DATA:  Weakness EXAM: PORTABLE CHEST 1 VIEW COMPARISON:  07/07/2017 FINDINGS: Left AICD remains in place, unchanged. Cardiomegaly. No confluent opacities, effusions or overt edema. No acute bony abnormality. IMPRESSION: Cardiomegaly.  No active disease. Electronically Signed   By: Rolm Baptise M.D.   On: 01/18/2020 16:01   Scheduled Meds: . allopurinol  100 mg Oral Daily  . levothyroxine  100 mcg Oral Q0600  . metoprolol succinate  25 mg Oral Daily  . rosuvastatin  20 mg Oral Daily  . senna  1 tablet Oral BID  . warfarin  2.5 mg Oral ONCE-1600  . Warfarin - Pharmacist Dosing Inpatient   Does not apply q1600   Continuous Infusions: .  sodium bicarbonate (isotonic) infusion in sterile water 75 mL/hr at 01/19/20 1647     LOS: 2 days    Time spent: 65min  Domenic Polite, MD [facility]  01/20/2020, 11:33 AM

## 2020-01-20 NOTE — Progress Notes (Signed)
[facility] Progress Note   85 y.o.year-old w/ hx of CHF EF 15% / sp AICD, gout, CKD III BL cr 2.6-3,  HTN, HL, hypothyroid, VT p/w  confusion, talking to dead relatives for the last month or so. Per EMS BP 's were 80/62 , in ED BP's 110/50. Labs showed K+ >7.5 and creat 7.2. CXR showed CM, no edema / infiltrates.  Pt is f/b Dr Marigene Ehlers of cardiology for severe DCM EF 10-15%. Last admit here was in Jan 2019 for a/c syst CHF, PAF, cKD stage 4 and mitral regurg. EF was 20% then. She was diuresed w/ IV lasix.   Assessment/ Plan:   1. AoCKD IV - hx solitary kidney, b/l creat 2.5- 3, eGFR 15-20 ml/min. Creat here 7.2. No vol excess, looks dry, BP's low per EMS. AKI likely due to  hypotension/ hypoperfusion/ poor LVEF. Minimal signs of uremia. Due to severe comorbidities (heart disease), pt would not likely do well on dialysis. Per pmd notes also note that patient does not want chronic HD. Medical tx  for hyperkalemia and AKI for now. She was as low as 71.8kg on 08/18/2019 - Renal US -> LK no e/o   obstruction.   - Hold BP meds and diuretics, let BP come up.  - Initially alternating Kayexalate and Lokelma + HCO3 -> now only on HCO3. Tr edema in LE.  Not a  long term candidate + pt does not want chronic HD. I am also concerned if we start RRT in the acute setting that she will not come off  Recommend medical management and completely agree with palliative consult.  Signing off at this time; please reconsult as needed. . 2. Hyperkalemia - widened QRS is also present on all prior EKG's so cannot be sure this is due to high K+ -> K now down to 3.5 3. Thrombocytopenia - per primary team 4. Chronic syst CHF - EF 10-15% 5. SP AICD/ PPM/ hx VT - paced rhythm on EKG 6. HTN - hold BP meds for now, as above 7. Gout 8. Chronic anticoag / hx afib - on coumadin 9. Code status - now DNR  Subjective:   Denies f/c/dyspnea and wondering why she's here, asking to go home.   Objective:   BP  98/68 (BP Location: Left Arm)   Pulse 93   Temp (!) 97.5 F (36.4 C) (Oral)   Resp 20   Ht 5' (1.524 m)   Wt 69.9 kg   SpO2 92%   BMI 30.10 kg/m   Intake/Output Summary (Last 24 hours) at 01/20/2020 1043 Last data filed at 01/20/2020 0945 Gross per 24 hour  Intake 3429.6 ml  Output 550 ml  Net 2879.6 ml   Weight change:   Physical Exam: Genpt is HOH, but able to communicate and pleasant Ox 3, not in distress No rash, cyanosis or gangrene Chest clear bilatto bases, no rales or wheezing RRR no MRG, displaced PMI, L upper chest ICD Abd softdistended due to obesity and/or ascites Exttr  LE,   no wounds or ulcers Neuro is alert, Ox 3 , nf, faint asterixis, gen'd weakness  Imaging: US RENAL  Result Date: 01/18/2020 CLINICAL DATA:  Acute renal failure EXAM: RENAL / URINARY TRACT ULTRASOUND COMPLETE COMPARISON:  June 16, 2015 FINDINGS: Right Kidney: Nonvisualized. There is a small nodule seen within the expected location of the right kidney which could represent the adrenal gland measuring 1.8 x 1.1 x 1.5 cm Left Kidney: Renal measurements: 10.2 x 5.7 x  5.1 cm = volume: 153 mL. Echogenicity within normal limits. Mild cortical thinning is seen. An anechoic cyst seen within the midpole measuring 1.2 x 1.0 x 1.3 cm. Bladder: Appears normal for degree of bladder distention. Other: None. IMPRESSION: Nonvisualized right kidney. Simple renal cyst within the left kidney.  No hydronephrosis. Electronically Signed   By: Prudencio Pair M.D.   On: 01/18/2020 23:13   DG Chest Portable 1 View  Result Date: 01/18/2020 CLINICAL DATA:  Weakness EXAM: PORTABLE CHEST 1 VIEW COMPARISON:  07/07/2017 FINDINGS: Left AICD remains in place, unchanged. Cardiomegaly. No confluent opacities, effusions or overt edema. No acute bony abnormality. IMPRESSION: Cardiomegaly.  No active disease. Electronically Signed   By: Rolm Baptise M.D.   On: 01/18/2020 16:01    Labs: BMET Recent Labs  Lab 01/18/20 1704  01/18/20 2147 01/19/20 0144 01/19/20 0608 01/19/20 1337 01/20/20 0121  NA 139  --  142  --  144  --   K >7.5* 5.9* 6.3* 6.4* 5.7* 3.5  CL 108  --  111  --  111  --   CO2 13*  --  12*  --  14*  --   GLUCOSE 78  --  73  --  81  --   BUN 119*  --  116*  --  113*  --   CREATININE 7.26*  --  7.12*  --  6.56*  --   CALCIUM 10.1  --  9.8  --  9.2  --   PHOS  --  6.8*  --   --   --   --    CBC Recent Labs  Lab 01/18/20 1704 01/19/20 0144 01/20/20 0121  WBC 7.8 7.9 7.1  NEUTROABS 6.1  --  5.3  HGB 14.4 14.0 12.2  HCT 47.5* 45.5 38.3  MCV 108.0* 108.6* 105.5*  PLT 52* 46* 42*    Medications:    . allopurinol  100 mg Oral Daily  . levothyroxine  100 mcg Oral Q0600  . metoprolol succinate  25 mg Oral Daily  . rosuvastatin  20 mg Oral Daily  . senna  1 tablet Oral BID  . warfarin  2.5 mg Oral ONCE-1600  . Warfarin - Pharmacist Dosing Inpatient   Does not apply q1600      Otelia Santee, MD 01/20/2020, 10:43 AM

## 2020-01-20 NOTE — Consult Note (Signed)
Consultation Note Date: 01/20/2020   Patient Name: Casey [name]  DOB: 15-Feb-1935  MRN: [id number]  Age / Sex: 85 y.o., female   PCP: Renold Genta Cresenciano Lick, MD Referring Physician: Domenic Polite, MD   REASON FOR CONSULTATION:Establishing goals of care  Palliative Care consult requested for goals of care discussion in this 85 y.o. female with multiple medical problems including ischemic cardiomyopathy with last EF 15-20%, CKD III, hypertension, HLD, hypothyroidism. She presented to the ED with concerns for increased confusion and weakness. BNP >4500, TSH 14.78, Cr 7.5, K 7.5, and BUN 119 on admission. She is being followed by Nephrology with recommendations for medical management, patient is not a long term HD candidate.   Clinical Assessment and Goals of Care: I have reviewed medical records including lab results, imaging, Epic notes, and MAR, received report from the bedside RN, and assessed the patient. I met at the bedside with patient's family (daughter, Casey [name], son-Casey [name], and son-Casey [name]) to discuss diagnosis prognosis, GOC, EOL wishes, disposition and options.  [name] is awake and alert to self only. She is having a discussion with her deceased sister, Casey [name]. Unable to answer questions appropriately. She is unable to engage in [place] discussion due to confusion.   I introduced Palliative Medicine as specialized medical care for people living with serious illness. It focuses on providing relief from the symptoms and stress of a serious illness. The goal is to improve quality of life for both the patient and the family. Patient's family verbalized understanding and appreciation.   We discussed a brief life review of the patient, along with her functional and nutritional status. Family reports patient's husband is  Long-term care resident at [facility] after suffering a severe stroke. They have been married for more than 60 years. Patient resided in the home alone. She has the  2 children. Her son Casey [name] lives in [place] and assisted with some needs in addition to neighbor and friend support. 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 She enjoys spending time with family and friends.   Prior to admission family reports patient was ambulatory with cane at times. She was able to perform ADLs without assistance. She would at times take a cab to complete grocery shopping or errands. Family reports over the past 2 months patient has become increasing weaker and showing signs of confusion. Daughter reports several incidents of patient referring to her deceased sister and parents when calling to speak with her.   We discussed Her current illness and what it means in the larger context of Her on-going co-morbidities. With specific discussions regarding renal failure, heart failure with EF 10%, confusion, and patient's overall functional and nutritional decline. Natural disease trajectory and expectations at EOL were discussed.  Family verbalized understanding of patient's overall condition. They report patient did not care for hospitals and although they had tried for some time to encourage her to seek medical attention she refused. They felt prior to admission patient did not understand what was going on and this was the best opportunity to move forward with seeking medical attention.   I attempted to elicit values and goals of care important to the patient.    Patient has previously expressed her wishes not to pursue aggressive treatments or dialysis per her daughter. Family is sadden however shares their mother have lived a good and prosperous life. We discussed [name]'s decreased quality of life over the past year with family noting a more drastic  change since Covid. Therapeutic listening and support provided. I encouraged family to keep patient's wishes at the center of their decision making and advocating for what they know she would want if able to make her  own decisions. Daughter and son tearful expressing appreciation.   The difference between aggressive medical intervention and comfort care was considered in light of the patient's goals of care. I educated patient/family on what comfort care measures would look like for patient.   Family is requesting to continue with care and treat the treatable allowing them some time to further discuss and make final decisions. They expressed thoughts of leaning towards comfort and allowing her to spend what time she has left peacefully and without "things being done to her not focused on her comfort!" Support given.   Advanced directives, concepts specific to code status, artifical feeding and hydration, and rehospitalization were considered and discussed. Patient does not have a documented advanced directive. Family was initially working to have documents completed. I explained in the setting of her confusion this is not something that would be able to be done now. They verbalized understanding.   Family confirmed wishes for DNR. They would not be interested in aggressive interventions such as hydration, artificial feedings, or HD knowing patient would not want this for herself. We discussed rehospitalization in the setting of comfort. Family all mutually agreed they would not want rehospitalization for her.   Hospice and Palliative Care services outpatient were explained and offered. Family verbalized their understanding and awareness of both palliative and hospice's goals and philosophy of care. I recommended outpatient hospice support for patient in the setting of full comfort with expressed goals by family for symptom management and allowing patient to spend her last moments peacefully. We discussed at length hospice care in the home versus residential hospice. Family verbalized understanding and expressed they would consider options but most likely would be more appropriate for residential hospice due to care  limitations and health conditions themselves. Her son is scheduled for knee surgery later this week.   Questions and concerns were addressed.  Hard Choices booklet left for review. The family was encouraged to call with questions or concerns.  PMT will continue to support holistically.   SOCIAL HISTORY:     reports that she has never smoked. She has never used smokeless tobacco. She reports that she does not drink alcohol and does not use drugs.  CODE STATUS: DNR  ADVANCE DIRECTIVES: Primary Decision Maker: Children   SYMPTOM MANAGEMENT: per attending   Palliative Prophylaxis:   Aspiration, Bowel Regimen, Delirium Protocol, Eye Care, Frequent Pain Assessment, Oral Care and Turn Reposition  PSYCHO-SOCIAL/SPIRITUAL:  Support System: Family  Desire for further Chaplaincy support:No   Additional Recommendations (Limitations, Scope, Preferences):  No Artificial Feeding, No Hemodialysis and DNR, treat the treatable allowing family to make final decisions.   Education on hospice/palliative    PAST MEDICAL HISTORY: Past Medical History:  Diagnosis Date  . Atherosclerosis of aorta ([place])   . Biventricular ICD (implantable cardiac defibrillator) in place    medtronic   . CHF (congestive heart failure) ([place])   . Chronic kidney disease (CKD), stage III (moderate)   . Chronic systolic heart failure ([place])   . Gout   . History of peptic ulcer disease   . Hyperlipidemia   . Hypertension   . Hypothyroidism   . Lumbar disc disease   . Nonischemic cardiomyopathy ([place])   . Ventricular septal defect   . VT (ventricular tachycardia) ([place])  appropriate therapy via ICD 2013    ALLERGIES:  is allergic to codeine.   MEDICATIONS:  Current Facility-Administered Medications  Medication Dose Route Frequency Provider Last Rate Last Admin  . acetaminophen (TYLENOL) tablet 650 mg  650 mg Oral Q6H PRN Norins, Heinz Knuckles, MD       Or  . acetaminophen (TYLENOL) suppository 650 mg  650 mg  Rectal Q6H PRN Norins, Heinz Knuckles, MD      . allopurinol (ZYLOPRIM) tablet 100 mg  100 mg Oral Daily Norins, Heinz Knuckles, MD   100 mg at 01/19/20 0852  . fentaNYL (SUBLIMAZE) injection 12.5-50 mcg  12.5-50 mcg Intravenous Q2H PRN Norins, Heinz Knuckles, MD      . levothyroxine (SYNTHROID) tablet 100 mcg  100 mcg Oral Q0600 Neena Rhymes, MD   100 mcg at 01/20/20 [phone]  . metoprolol succinate (TOPROL-XL) 24 hr tablet 25 mg  25 mg Oral Daily Domenic Polite, MD   25 mg at 01/20/20 0914  . rosuvastatin (CRESTOR) tablet 20 mg  20 mg Oral Daily Norins, Heinz Knuckles, MD   20 mg at 01/20/20 0914  . senna (SENOKOT) tablet 8.6 mg  1 tablet Oral BID Neena Rhymes, MD   8.6 mg at 01/20/20 0914  . sodium bicarbonate 150 mEq in sterile water 1,000 mL infusion   Intravenous Continuous Roney Jaffe, MD 75 mL/hr at 01/19/20 1647 New Bag at 01/19/20 1647  . warfarin (COUMADIN) tablet 2.5 mg  2.5 mg Oral ONCE-1600 Joselyn Glassman A, RPH      . Warfarin - Pharmacist Dosing Inpatient   Does not apply q1600 Bertis Ruddy, [facility]        VITAL SIGNS: BP 98/68 (BP Location: Left Arm)   Pulse 93   Temp (!) 97.5 F (36.4 C) (Oral)   Resp 20   Ht 5' (1.524 m)   Wt 69.9 kg   SpO2 92%   BMI 30.10 kg/m  Filed Weights   01/18/20 1547 01/18/20 2144 01/19/20 0451  Weight: 68 kg 69.1 kg 69.9 kg    Estimated body mass index is 30.1 kg/m as calculated from the following:   Height as of this encounter: 5' (1.524 m).   Weight as of this encounter: 69.9 kg.  LABS: CBC:    Component Value Date/Time   WBC 7.1 01/20/2020 0121   HGB 12.2 01/20/2020 0121   HCT 38.3 01/20/2020 0121   PLT 42 (L) 01/20/2020 0121   Comprehensive Metabolic Panel:    Component Value Date/Time   NA 144 01/19/2020 1337   K 3.5 01/20/2020 0121   BUN 113 (H) 01/19/2020 1337   CREATININE 6.56 (H) 01/19/2020 1337   CREATININE 2.30 (H) 10/20/2019 1109   ALBUMIN 3.9 01/18/2020 1704     Review of Systems  Unable to perform ROS: Acuity of  condition  Physical Exam General: NAD, frail chronically-ill appearing Cardiovascular: regular rate and rhythm Pulmonary: diminished bilaterally  Abdomen: soft, mild distension, nontender, + bowel sounds Extremities: no edema, no joint deformities Skin: no rashes, warm and dry Neurological: alert to self only, will follow some commands, hearing aids in place   Prognosis: < 2 weeks in the setting of CKD IV, EF 10%, not a HD candidate, generalized weakness, poor po intake, confusion, deconditioned, hyperkalemia, CHF, severe mitral regurgitation s/p AICD, hypothyroidism.   Discharge Planning:  To Be Determined (family leaning towards residential hospice and comfort care).   Recommendations: . DNR/DNI-as confirmed by family (daughter/son) . Continue with current plan of care  per medical team with no escalation of care.  . NO PEG or HD.  Marland Kitchen Discussed at length with family patient's poor prognosis and disease trajectory. Family very understanding and realistic in expectations. Expressed wishes for patient to be comfortable and amongst family with what time she has left. Would like to continue discussions amongst themselves with follow-up planned for tomorrow morning for final decisions.  Marland Kitchen PMT will continue to support and follow. Please call team line with urgent needs.   Palliative Performance Scale: PPS 20%              Family expressed understanding and was in agreement with this plan.   Thank you for allowing the Palliative Medicine Team to assist in the care of this patient.  Time In: 1430 Time Out: 1545 Time Total: 75 min   Visit consisted of counseling and education dealing with the complex and emotionally intense issues of symptom management and palliative care in the setting of serious and potentially life-threatening illness.Greater than 50%  of this time was spent counseling and coordinating care related to the above assessment and plan.  Signed by:  Alda Lea, AGPCNP-BC Palliative Medicine Team  Phone: [phone] Pager: [phone] Amion: Bjorn Pippin

## 2020-01-20 NOTE — Plan of Care (Signed)
  Problem: Pain Managment: Goal: General experience of comfort will improve Outcome: Progressing   

## 2020-01-20 NOTE — Progress Notes (Addendum)
ANTICOAGULATION CONSULT NOTE - Initial Consult  Pharmacy Consult for warfarin Indication: atrial fibrillation  Allergies  Allergen Reactions  . Codeine Rash    Patient Measurements: Height: 5' (152.4 cm) Weight: 69.9 kg (154 lb 1.6 oz) IBW/kg (Calculated) : 45.5  Vital Signs: Temp: 98.3 F (36.8 C) (08/10 0429) Temp Source: Oral (08/10 0429) BP: 92/50 (08/10 0429) Pulse Rate: 88 (08/10 0429)  Labs: Recent Labs    01/18/20 1704 01/18/20 1704 01/19/20 0144 01/19/20 1337 01/20/20 0121  HGB 14.4   < > 14.0  --  12.2  HCT 47.5*  --  45.5  --  38.3  PLT 52*  --  46*  --  42*  LABPROT 18.9*  --  19.2*  --  20.1*  INR 1.7*  --  1.7*  --  1.8*  CREATININE 7.26*  --  7.12* 6.56*  --    < > = values in this interval not displayed.    Estimated Creatinine Clearance: 5.7 mL/min (A) (by C-G formula based on SCr of 6.56 mg/dL (H)).  Assessment: 22 YOF presenting with general fatigue and weakness.  Hx of Afib on warfarin, hx of non-compliance, was to follow with INR clinic however only made it to one appointment and missed her 7/20 appt.  Last dosing was 2.5mg  daily (except 3.75mg  Mon/Fri) and INR 1.7 on admission.    INR this am 1.8  Goal of Therapy:  INR 2-3 Monitor platelets by anticoagulation protocol: Yes   Plan:  Give warfarin 2.5 mg PO x 1 tonight Daily INR, s/s bleeding  Karisa Nesser A. Levada Dy, PharmD, BCPS, FNKF Clinical Pharmacist [place] Please utilize Amion for appropriate phone number to reach the unit pharmacist ([place])    01/20/2020 7:34 AM

## 2020-01-20 NOTE — Plan of Care (Signed)
  Problem: Urinary Elimination: Goal: Signs and symptoms of infection will decrease Outcome: Progressing   Problem: Clinical Measurements: Goal: Ability to maintain clinical measurements within normal limits will improve Outcome: Progressing Goal: Will remain free from infection Outcome: Progressing Goal: Cardiovascular complication will be avoided Outcome: Progressing

## 2020-01-20 NOTE — Telephone Encounter (Signed)
Patient needs follow up with Dr Caryl Comes after discharge. Overdue for follow up and device at RRT since 12/25/19. Currently inpatient. No show for appointment on 12/30/19 appointment with Dr Caryl Comes.

## 2020-01-20 NOTE — Progress Notes (Signed)
     Referral received for Casey [name] :goals of care discussion. Chart reviewed and updates received from RN. Patient assessed and is unable to engage appropriately in discussions. Patient alert to self only. Having discussion with her deceased sister, Altha Harm (as clarified by children).    I was able to contact patient's daughter, Rollene Fare. Patient's son, Bobby Rumpf was also with his sister. Introduction to Palliative and our role in [name]'s care while hospitalized provided. Family verbalized understanding and appreciation.    Family lives out of town and is preparing to visit. [place] meeting scheduled for today 01/20/20 @ 1430. Family is aware we will meet at patient's bedside. In the setting of medical decision making and family involvement with no identified HCPOA Lenette, Lewis, and Donal should be allowed for [name] meeting only. Family made aware of visitation policy, meeting exception in the setting of COVID.   Detailed note and recommendations to follow once GOC has been completed.   Thank you for your referral and allowing PMT to assist in [name]'s care.   Alda Lea, AGPCNP-BC Palliative Medicine Team  Phone: [phone] Pager: [phone] Amion: N. [place]ar   NO CHARGE

## 2020-01-20 NOTE — Progress Notes (Signed)
°   01/20/20 0900  Clinical Encounter Type  Visited With Patient  Visit Type Follow-up  Referral From Nurse  Consult/Referral To Chaplain   Chaplain made second attempt to complete advanced directive with pt. Pt was having blood drawn at time of chaplain's arrival. Pt seems to be agitated and maybe confused today. Pt was repeating the same statements over and over. Pt would look past chaplain and seldom would pt look directly at chaplain. Pt requested MD come and "tell me why I am in this hospital and what is going on with my body... can I go home?" Chaplains remain available for support and will attempt to complete AD at a time when pt is not agitated and confused.   Chaplain Resident, Evelene Croon, [place] [phone] on-call pager

## 2020-01-21 DIAGNOSIS — I5022 Chronic systolic (congestive) heart failure: Secondary | ICD-10-CM | POA: Diagnosis not present

## 2020-01-21 DIAGNOSIS — E039 Hypothyroidism, unspecified: Secondary | ICD-10-CM

## 2020-01-21 DIAGNOSIS — R531 Weakness: Secondary | ICD-10-CM | POA: Diagnosis not present

## 2020-01-21 DIAGNOSIS — E875 Hyperkalemia: Secondary | ICD-10-CM | POA: Diagnosis not present

## 2020-01-21 DIAGNOSIS — N179 Acute kidney failure, unspecified: Secondary | ICD-10-CM | POA: Diagnosis not present

## 2020-01-21 LAB — BASIC METABOLIC PANEL WITH GFR
Anion gap: 17 — ABNORMAL HIGH (ref 5–15)
BUN: 90 mg/dL — ABNORMAL HIGH (ref 8–23)
CO2: 22 mmol/L (ref 22–32)
Calcium: 8.4 mg/dL — ABNORMAL LOW (ref 8.9–10.3)
Chloride: 102 mmol/L (ref 98–111)
Creatinine, Ser: 4.43 mg/dL — ABNORMAL HIGH (ref 0.44–1.00)
GFR calc Af Amer: 10 mL/min — ABNORMAL LOW
GFR calc non Af Amer: 9 mL/min — ABNORMAL LOW
Glucose, Bld: 126 mg/dL — ABNORMAL HIGH (ref 70–99)
Potassium: 3.1 mmol/L — ABNORMAL LOW (ref 3.5–5.1)
Sodium: 141 mmol/L (ref 135–145)

## 2020-01-21 LAB — PROTIME-INR
INR: 1.7 — ABNORMAL HIGH (ref 0.8–1.2)
Prothrombin Time: 19.4 seconds — ABNORMAL HIGH (ref 11.4–15.2)

## 2020-01-21 LAB — PATHOLOGIST SMEAR REVIEW

## 2020-01-21 MED ORDER — POTASSIUM CHLORIDE CRYS ER 20 MEQ PO TBCR
40.0000 meq | EXTENDED_RELEASE_TABLET | Freq: Once | ORAL | Status: DC
  Filled 2020-01-21: qty 2

## 2020-01-21 MED ORDER — HALOPERIDOL LACTATE 5 MG/ML IJ SOLN
0.5000 mg | INTRAMUSCULAR | Status: DC | PRN
  Administered 2020-01-21: 0.5 mg via INTRAVENOUS
  Filled 2020-01-21: qty 1

## 2020-01-21 MED ORDER — LORAZEPAM 2 MG/ML IJ SOLN
0.5000 mg | INTRAMUSCULAR | Status: DC | PRN
  Administered 2020-01-21 (×2): 0.5 mg via INTRAVENOUS
  Filled 2020-01-21 (×2): qty 1

## 2020-01-21 MED ORDER — GLYCOPYRROLATE 0.2 MG/ML IJ SOLN: 0.2000 mg | INTRAMUSCULAR | Status: DC | PRN

## 2020-01-21 MED ORDER — HALOPERIDOL LACTATE 5 MG/ML IJ SOLN: 0.5000 mg | INTRAMUSCULAR | Status: DC | PRN

## 2020-01-21 MED ORDER — ONDANSETRON HCL 4 MG/2ML IJ SOLN: 4.0000 mg | Freq: Four times a day (QID) | INTRAMUSCULAR | Status: DC | PRN

## 2020-01-21 MED ORDER — SODIUM BICARBONATE 650 MG PO TABS
650.0000 mg | ORAL_TABLET | Freq: Three times a day (TID) | ORAL | Status: DC
  Filled 2020-01-21: qty 1

## 2020-01-21 MED ORDER — WARFARIN SODIUM 5 MG PO TABS
5.0000 mg | ORAL_TABLET | Freq: Once | ORAL | Status: AC
  Administered 2020-01-21: 5 mg via ORAL
  Filled 2020-01-21: qty 1

## 2020-01-21 MED ORDER — SENNA 8.6 MG PO TABS: 1.0000 | ORAL_TABLET | Freq: Two times a day (BID) | ORAL | Status: DC | PRN

## 2020-01-21 MED ORDER — BIOTENE DRY MOUTH MT LIQD: 15.0000 mL | OROMUCOSAL | Status: DC | PRN

## 2020-01-21 MED ORDER — POLYVINYL ALCOHOL 1.4 % OP SOLN
1.0000 [drp] | Freq: Four times a day (QID) | OPHTHALMIC | Status: DC | PRN
  Filled 2020-01-21: qty 15

## 2020-01-21 NOTE — Progress Notes (Signed)
Noted family preference for [facility] for residential Hospice needs- referral made to Venia Carbon with [facility] for [employer].

## 2020-01-21 NOTE — Progress Notes (Signed)
Daily Progress Note   Patient Name: Casey [name]       Date: 01/21/2020 DOB: 05-Nov-1934  Age: 85 y.o. MRN#: [id number] Attending Physician: Flora Lipps, MD Primary Care Physician: Elby Showers, MD Admit Date: 01/18/2020  Reason for Consultation/Follow-up: Establishing goals of care, Non pain symptom management, Pain control, Psychosocial/spiritual support and Terminal Care  Subjective: Patient awake and alert to self only in bed. RN at the bedside and patient refusing to take medications. She is holding the telephone and expressing to someone she is on the way to see them. When asked to speak with them after receiving phone no one was on the line and phone was turned off. When asked patient reports she is talking to her sister, Casey [name] (who is deceased). I attempt to reorient patient. When handing phone back she picks it back up and began talking as if someone is on the other line.   Denies pain or shortness of breath. No family is at the bedside. Continues to have poor appetite.   I was able to speak with patient's daughter, son,  and son-in-law via phone. We discussed goals of care meeting yesterday. Family verbalized their understanding and appreciation. Updates provided. Daughter tearful expressing family would like to follow patient wishes and at this time they would like to confirm their decision to transition care to comfort and patient to be [facility] for [facility] facility for EOL of care. Support provided. Daughter lives in [place] however, patient's son resides in [place] and plans to be of support in addition to his sister.   I reviewed what comfort care measures would look like for patient in addition to hospice care. Family all mutually verbalized agreement and confirms wishes to proceed.   Education provided on next steps regarding referral and EOL expectations.   All questions answered and support provided.   Length of Stay: 3 days  Vital Signs: BP 115/71  (BP Location: Left Arm)   Pulse 92   Temp 97.7 F (36.5 C) (Oral)   Resp 18   Ht 5' (1.524 m)   Wt 70.7 kg   SpO2 90%   BMI 30.44 kg/m  SpO2: SpO2: 90 % O2 Device: O2 Device: Room Air O2 Flow Rate:    Physical Exam: -awake, alert to self only, chronically-ill -normal breathing pattern -confused, will follow some commands            Palliative Care Assessment & Plan  HPI: Palliative Care consult requested for goals of care discussion in this 85 y.o. female with multiple medical problems including ischemic cardiomyopathy with last EF 15-20%, CKD III, hypertension, HLD, hypothyroidism. She presented to the ED with concerns for increased confusion and weakness. BNP >4500, TSH 14.78, Cr 7.5, K 7.5, and BUN 119 on admission. She is being followed by Nephrology with recommendations for medical management, patient is not a long term HD candidate.    Code Status:  DNR  Goals of Care/Recommendations:  Transition all care to full comfort/EOL as requested by family (daughter/son)  Will d/c medical interventions/orders not comfort focused.   TOC referral for residential hospice facility (family expressed preference for [facility]) Continue Fentanyl PRN for pain/air hunger/comfort Robinul PRN for excessive secretions Zofran PRN for nausea Liquifilm tears PRN for dry eyes Haldol PRN for agitation/anxiety May have comfort feeding Unrestricted visitations in the setting of EOL (per policy) Oxygen PRN 2L or less for comfort. No escalation.  PMT will continue to support and follow.  Prognosis: < 2  weeks in the setting of CKD IV, EF 10%, not a HD candidate, generalized weakness, poor po intake, confusion, deconditioned, hyperkalemia, CHF, severe mitral regurgitation s/p AICD, hypothyroidism. BUN 97, Cr 5.26, GFR 8, BNP >4500  Discharge Planning: Hospice facility  Thank you for allowing the Palliative Medicine Team to assist in the care of this patient.  Time Total: 45 min.   Visit  consisted of counseling and education dealing with the complex and emotionally intense issues of symptom management and palliative care in the setting of serious and potentially life-threatening illness.Greater than 50%  of this time was spent counseling and coordinating care related to the above assessment and plan.  Alda Lea, AGPCNP-BC  Palliative Medicine Team [phone]

## 2020-01-21 NOTE — Progress Notes (Signed)
ANTICOAGULATION CONSULT NOTE - Initial Consult  Pharmacy Consult for warfarin Indication: atrial fibrillation  Allergies  Allergen Reactions  . Codeine Rash    Patient Measurements: Height: 5' (152.4 cm) Weight: 70.7 kg (155 lb 13.8 oz) IBW/kg (Calculated) : 45.5  Vital Signs: Temp: 98.6 F (37 C) (08/11 0539) Temp Source: Oral (08/11 0539) BP: 108/62 (08/11 0539) Pulse Rate: 90 (08/11 0539)  Labs: Recent Labs    01/18/20 1704 01/18/20 1704 01/19/20 0144 01/19/20 0144 01/19/20 1337 01/20/20 0121 01/20/20 1140 01/21/20 0310  HGB 14.4   < > 14.0  --   --  12.2  --   --   HCT 47.5*  --  45.5  --   --  38.3  --   --   PLT 52*  --  46*  --   --  42*  --   --   LABPROT 18.9*   < > 19.2*  --   --  20.1*  --  19.4*  INR 1.7*   < > 1.7*  --   --  1.8*  --  1.7*  CREATININE 7.26*   < > 7.12*   < > 6.56*  --  5.26* 4.43*   < > = values in this interval not displayed.    Estimated Creatinine Clearance: 8.4 mL/min (A) (by C-G formula based on SCr of 4.43 mg/dL (H)).  Assessment: 54 YOF presenting with general fatigue and weakness.  Hx of Afib on warfarin, hx of non-compliance, was to follow with INR clinic however only made it to one appointment and missed her 7/20 appt.  Last dosing was 2.5mg  daily (except 3.75mg  Mon/Fri) and INR 1.7 on admission.    INR this am 1.7  Goal of Therapy:  INR 2-3 Monitor platelets by anticoagulation protocol: Yes   Plan:  Give warfarin 5 mg PO x 1 tonight Daily INR, s/s bleeding  Patton Rabinovich A. Levada Dy, PharmD, BCPS, FNKF Clinical Pharmacist [place] Please utilize Amion for appropriate phone number to reach the unit pharmacist ([place])    01/21/2020 7:23 AM

## 2020-01-21 NOTE — Plan of Care (Signed)
  Problem: Clinical Measurements: Goal: Diagnostic test results will improve Outcome: Completed/Met

## 2020-01-21 NOTE — Progress Notes (Addendum)
PROGRESS NOTE  Casey [name] IEP:[id number] DOB: 15-May-1935 DOA: 01/18/2020 PCP: Elby Showers, MD   LOS: 3 days   Brief narrative: As per HPI,  Casey [name] is an 85/F with past history of severe non ischemic cardiomyopathy, EF of 10-15%, with AICD, moderate to severe mitral regurgitation history of V. tach, stage IV chronic kidney disease, hypothyroidism, hypertension, dyslipidemia, peptic ulcer disease was brought to the ED by EMS for worsening confusion.  Per report, patient does have history of hallucinations for the last 1 to 2 months, she lives by herself but has a neighbor checking in on her, providing food etc. She is also very hard of hearing, when EMS arrived blood pressure was 80/62, labs noted potassium greater than 7.5 and creatinine of 7.2, chest x-ray noted cardiomegaly, no edema. Nephrology was consulted and she was not felt to be a good dialysis candidate and patient indicated in the past that she would not want hemodialysis. She was started on medical treatment for hyperkalemia and sodium bicarbonate fluids. Palliative team was consulted, renal has signed off and noted that she is not a dialysis candidate.  Assessment/Plan:  Active Problems:   Chronic systolic CHF (congestive heart failure) (HCC)   Nonischemic cardiomyopathy (HCC)   Paroxysmal atrial fibrillation (HCC)   Hypothyroidism   Kidney disease, chronic, stage III (GFR 30-59 ml/min) (HCC)   Hyperkalemia   Acute renal failure (HCC)   General weakness   Uremia  AKI on CKD stage IV/Severe hyperkalemia/Severe metabolic acidosis/Suspected uremia Patient presented with creatinine of 7.2 and potassium of 7.5 with severe metabolic acidosis.  Patient received a sodium bicarb during hospitalization and received temporizing measures for hyper kalemia.  Patient with poor urinary output and is not a dialysis candidate as per nephrology.  Palliative care was consulted and due to severe cardiomyopathy with ejection  fraction of 10%, advanced age, debility and she has been considered for hospice care at this time.  Mild hypokalemia.  Replenish orally.    Acute on chronic thrombocytopenia No evidence of bleeding at this time.  Latest platelet count of 46,000.   Chronic systolic CHF, EF 66-06%/[id number] to severe mitral regurgitation Status post AICD/ Diuretics were on hold.  Toprol was restarted at a lower dose.   Paroxysmal atrial fibrillation -Coumadin restarted on admission, restarted Toprol at low-dose) provided.   History of hypothyroidism -TSH is very high, restarted  throughout this admission.  Acute metabolic encephalopathy. Present on admission. Improved at this time.  DVT prophylaxis:   warfarin (COUMADIN) tablet 5 mg    Code Status: DO NOT RESUSCITATE  Family Communication: Palliative care had a prolonged discussion with the family today.  At this time, plan is to move forward with hospice level of care.  Status is: Inpatient  Remains inpatient appropriate because:Inpatient level of care appropriate due to severity of illness   Dispo: The patient is from: Home              Anticipated d/c is to: Residential hospice/comfort care              Anticipated d/c date is: 1-2 days              Patient currently is not medically stable to d/c.  Consultants: Neurology Palliative care  Procedures: None  Antibiotics:  None  Anti-infectives (From admission, onward)    None       Subjective: Today, patient was seen and examined at bedside.  Patient denies chest pain, shortness of breath or dyspnea.  Denies any nausea, vomiting or abdominal pain.  Wishes to go home.    Objective: Vitals:   01/20/20 2054 01/21/20 0539  BP: 108/62 108/62  Pulse: 84 90  Resp: 20 20  Temp: (!) 97.5 F (36.4 C) 98.6 F (37 C)  SpO2: 93% 94%    Intake/Output Summary (Last 24 hours) at 01/21/2020 0732 Last data filed at 01/21/2020 0600 Gross per 24 hour  Intake 2256.83 ml  Output 275 ml   Net 1981.83 ml   Filed Weights   01/18/20 2144 01/19/20 0451 01/20/20 2054  Weight: 69.1 kg 69.9 kg 70.7 kg   Body mass index is 30.44 kg/m.   Physical Exam: GENERAL: Patient is alert awake and communicative.  Chronically ill, elderly female.  Confused at times. HENT: Mild pallor noted. Pupils equally reactive to light. Oral mucosa is moist NECK: is supple, no gross swelling noted. CHEST: Diminished breath sounds bilaterally.  Left chest wall AICD in place. CVS: S1 and S2 heard, murmur noted. Regular rate and rhythm.  ABDOMEN: Soft, non-tender, bowel sounds are present.  External urinary catheter in place. EXTREMITIES: No edema. CNS: Cranial nerves are intact.  Moves all extremities. SKIN: warm and dry without rashes.  Data Review: I have personally reviewed the following laboratory data and studies,  CBC: Recent Labs  Lab 01/18/20 1704 01/19/20 0144 01/20/20 0121  WBC 7.8 7.9 7.1  NEUTROABS 6.1  --  5.3  HGB 14.4 14.0 12.2  HCT 47.5* 45.5 38.3  MCV 108.0* 108.6* 105.5*  PLT 52* 46* 42*   Basic Metabolic Panel: Recent Labs  Lab 01/18/20 1704 01/18/20 1704 01/18/20 2147 01/18/20 2147 01/19/20 0144 01/19/20 0144 01/19/20 0608 01/19/20 1337 01/20/20 0121 01/20/20 1140 01/21/20 0310  NA 139  --   --   --  142  --   --  144  --  139 141  K >7.5*   < > 5.9*   < > 6.3*   < > 6.4* 5.7* 3.5 3.2* 3.1*  CL 108  --   --   --  111  --   --  111  --  104 102  CO2 13*  --   --   --  12*  --   --  14*  --  20* 22  GLUCOSE 78  --   --   --  73  --   --  81  --  148* 126*  BUN 119*  --   --   --  116*  --   --  113*  --  97* 90*  CREATININE 7.26*  --   --   --  7.12*  --   --  6.56*  --  5.26* 4.43*  CALCIUM 10.1  --   --   --  9.8  --   --  9.2  --  8.4* 8.4*  MG 3.2*  --   --   --   --   --   --   --  2.7*  --   --   PHOS  --   --  6.8*  --   --   --   --   --   --   --   --    < > = values in this interval not displayed.   Liver Function Tests: Recent Labs  Lab  01/18/20 1704  AST 24  ALT 16  ALKPHOS 182*  BILITOT 5.1*  PROT 6.6  ALBUMIN 3.9   No  results for input(s): LIPASE, AMYLASE in the last 168 hours. No results for input(s): AMMONIA in the last 168 hours. Cardiac Enzymes: No results for input(s): CKTOTAL, CKMB, CKMBINDEX, TROPONINI in the last 168 hours. BNP (last 3 results) Recent Labs    01/18/20 1704  BNP >4,500.0*    ProBNP (last 3 results) No results for input(s): PROBNP in the last 8760 hours.  CBG: No results for input(s): GLUCAP in the last 168 hours. Recent Results (from the past 240 hour(s))  SARS Coronavirus 2 by RT PCR (hospital order, performed in [facility] hospital lab) Nasopharyngeal Nasopharyngeal Swab     Status: None   Collection Time: 01/18/20  3:38 PM   Specimen: Nasopharyngeal Swab  Result Value Ref Range Status   SARS Coronavirus 2 NEGATIVE NEGATIVE Final    Comment: (NOTE) SARS-CoV-2 target nucleic acids are NOT DETECTED.  The SARS-CoV-2 RNA is generally detectable in upper and lower respiratory specimens during the acute phase of infection. The lowest concentration of SARS-CoV-2 viral copies this assay can detect is 250 copies / mL. A negative result does not preclude SARS-CoV-2 infection and should not be used as the sole basis for treatment or other patient management decisions.  A negative result may occur with improper specimen collection / handling, submission of specimen other than nasopharyngeal swab, presence of viral mutation(s) within the areas targeted by this assay, and inadequate number of viral copies (<250 copies / mL). A negative result must be combined with clinical observations, patient history, and epidemiological information.  Fact Sheet for Patients:   StrictlyIdeas.no  Fact Sheet for Healthcare Providers: BankingDealers.co.za  This test is not yet approved or  cleared by the [place] FDA and has been authorized for  detection and/or diagnosis of SARS-CoV-2 by FDA under an Emergency Use Authorization (EUA).  This EUA will remain in effect (meaning this test can be used) for the duration of the COVID-19 declaration under Section 564(b)(1) of the Act, 21 U.S.C. section 360bbb-3(b)(1), unless the authorization is terminated or revoked sooner.  Performed at [facility], [place] [address], [place], [place] [address]   Urine culture     Status: Abnormal   Collection Time: 01/18/20  8:07 PM   Specimen: Urine, Random  Result Value Ref Range Status   Specimen Description URINE, RANDOM  Final   Special Requests   Final    NONE Performed at [facility], [place] [address], [place], [place] [address]    Culture >=100,000 COLONIES/mL ESCHERICHIA COLI (A)  Final   Report Status 01/20/2020 FINAL  Final   Organism ID, Bacteria ESCHERICHIA COLI (A)  Final      Susceptibility   Escherichia coli - MIC*    AMPICILLIN <=2 SENSITIVE Sensitive     CEFAZOLIN <=4 SENSITIVE Sensitive     CEFTRIAXONE <=0.25 SENSITIVE Sensitive     CIPROFLOXACIN <=0.25 SENSITIVE Sensitive     GENTAMICIN <=1 SENSITIVE Sensitive     IMIPENEM <=0.25 SENSITIVE Sensitive     NITROFURANTOIN <=16 SENSITIVE Sensitive     TRIMETH/SULFA <=20 SENSITIVE Sensitive     AMPICILLIN/SULBACTAM <=2 SENSITIVE Sensitive     PIP/TAZO <=4 SENSITIVE Sensitive     * >=100,000 COLONIES/mL ESCHERICHIA COLI     Studies: No results found.    Flora Lipps, MD  [facility] 01/21/2020

## 2020-01-21 NOTE — Telephone Encounter (Signed)
Per Epic note palliative care team involved. No need for appointment with Dr Caryl Comes at this time.

## 2020-01-21 NOTE — Progress Notes (Signed)
[facility]er, maintenance [facility]) Hospital Liaison note.   Received request from [place] for family interest in [facility]. [facility] is unable to offer a room today. Hospital Liaison will follow up tomorrow or sooner if a room becomes available.   A Please do not hesitate to call with questions.   Thank you,  Clementeen Hoof, RN, BSN   [place] (listed on [place] under Hospice and [place] [facility] [place])   [phone]

## 2020-01-22 DIAGNOSIS — I5022 Chronic systolic (congestive) heart failure: Secondary | ICD-10-CM | POA: Diagnosis not present

## 2020-01-22 DIAGNOSIS — E875 Hyperkalemia: Secondary | ICD-10-CM | POA: Diagnosis not present

## 2020-01-22 DIAGNOSIS — G9340 Encephalopathy, unspecified: Secondary | ICD-10-CM | POA: Diagnosis not present

## 2020-01-22 DIAGNOSIS — R531 Weakness: Secondary | ICD-10-CM | POA: Diagnosis not present

## 2020-01-22 DIAGNOSIS — N179 Acute kidney failure, unspecified: Secondary | ICD-10-CM | POA: Diagnosis not present

## 2020-01-22 LAB — COMPREHENSIVE METABOLIC PANEL
ALT: 14 U/L (ref 0–44)
AST: 30 U/L (ref 15–41)
Albumin: 3.1 g/dL — ABNORMAL LOW (ref 3.5–5.0)
Alkaline Phosphatase: 136 U/L — ABNORMAL HIGH (ref 38–126)
Anion gap: 16 — ABNORMAL HIGH (ref 5–15)
BUN: 75 mg/dL — ABNORMAL HIGH (ref 8–23)
CO2: 24 mmol/L (ref 22–32)
Calcium: 9 mg/dL (ref 8.9–10.3)
Chloride: 101 mmol/L (ref 98–111)
Creatinine, Ser: 3.56 mg/dL — ABNORMAL HIGH (ref 0.44–1.00)
GFR calc Af Amer: 13 mL/min — ABNORMAL LOW (ref >60)
GFR calc non Af Amer: 11 mL/min — ABNORMAL LOW (ref >60)
Glucose, Bld: 110 mg/dL — ABNORMAL HIGH (ref 70–99)
Potassium: 4 mmol/L (ref 3.5–5.1)
Sodium: 141 mmol/L (ref 135–145)
Total Bilirubin: 3.6 mg/dL — ABNORMAL HIGH (ref 0.3–1.2)
Total Protein: 5.5 g/dL — ABNORMAL LOW (ref 6.5–8.1)

## 2020-01-22 LAB — CBC
HCT: 41.4 % (ref 36.0–46.0)
Hemoglobin: 13 g/dL (ref 12.0–15.0)
MCH: 33.2 pg (ref 26.0–34.0)
MCHC: 31.4 g/dL (ref 30.0–36.0)
MCV: 105.9 fL — ABNORMAL HIGH (ref 80.0–100.0)
Platelets: 40 10*3/uL — ABNORMAL LOW (ref 150–400)
RBC: 3.91 MIL/uL (ref 3.87–5.11)
RDW: 17.8 % — ABNORMAL HIGH (ref 11.5–15.5)
WBC: 7.1 10*3/uL (ref 4.0–10.5)
nRBC: 0 % (ref 0.0–0.2)

## 2020-01-22 LAB — PROTIME-INR
INR: 1.6 — ABNORMAL HIGH (ref 0.8–1.2)
Prothrombin Time: 18.7 seconds — ABNORMAL HIGH (ref 11.4–15.2)

## 2020-01-22 LAB — MAGNESIUM: Magnesium: 2.2 mg/dL (ref 1.7–2.4)

## 2020-01-22 LAB — PHOSPHORUS: Phosphorus: 3.7 mg/dL (ref 2.5–4.6)

## 2020-01-22 NOTE — Progress Notes (Signed)
PROGRESS NOTE  Casey [name] DXI:[id number] DOB: 01-Nov-1934 DOA: 01/18/2020 PCP: Elby Showers, MD   LOS: 4 days   Brief narrative: As per HPI,  Casey [name] is an 85/F with past history of severe non ischemic cardiomyopathy, EF of 10-15%, with AICD, moderate to severe mitral regurgitation history of V. tach, stage IV chronic kidney disease, hypothyroidism, hypertension, dyslipidemia, peptic ulcer disease was brought to the ED by EMS for worsening confusion.  Per report, patient does have history of hallucinations for the last 1 to 2 months, she lives by herself but has a neighbor checking in on her, providing food etc. She is also very hard of hearing, when EMS arrived blood pressure was 80/62, labs noted potassium greater than 7.5 and creatinine of 7.2, chest x-ray noted cardiomegaly, no edema. Nephrology was consulted and she was not felt to be a good dialysis candidate and patient indicated in the past that she would not want hemodialysis. She was started on medical treatment for hyperkalemia and sodium bicarbonate fluids. Palliative team was consulted, renal has signed off and noted that she is not a dialysis candidate.  Assessment/Plan:  Active Problems:   Chronic systolic CHF (congestive heart failure) (HCC)   Nonischemic cardiomyopathy (HCC)   Paroxysmal atrial fibrillation (HCC)   Hypothyroidism   Kidney disease, chronic, stage III (GFR 30-59 ml/min) (HCC)   Hyperkalemia   Acute renal failure (HCC)   General weakness   Uremia  AKI on CKD stage IV/Severe hyperkalemia/Severe metabolic acidosis/Suspected uremia Patient presented with creatinine of 7.2 and potassium of 7.5 with severe metabolic acidosis.  Patient received a sodium bicarb during hospitalization and received temporizing measures for hyperkalemia.  Currently on oral bicarb.  Patient with poor urinary output and is not a dialysis candidate as per nephrology.  Palliative care was consulted and due to severe  cardiomyopathy with ejection fraction of 10%, advanced age, debility and she has been considered for hospice care at this time.  Mild hypokalemia.  Replenished orally. Potassium of 4.0 on 01/21/20    Acute on chronic thrombocytopenia No evidence of bleeding at this time.  Latest platelet count of 40,000.   Chronic systolic CHF, EF 76-73%/ALPFXTKW to severe mitral regurgitation Status post AICD/ Diuretics on hold.  Off toprol.   Paroxysmal atrial fibrillation Off toprol and coumadin   History of hypothyroidism -TSH is very high, off synthroid  Acute metabolic encephalopathy. Present on admission.   DVT prophylaxis: off warfarin   Code Status: DO NOT RESUSCITATE  Family Communication: Palliative care had a prolonged discussion with the family on 8/12. At this time, plan is  hospice level of care.  Status is: Inpatient  Remains inpatient appropriate because:Inpatient level of care appropriate due to severity of illness, awaiting residential hospice   Dispo: The patient is from: Home              Anticipated d/c is to: Residential hospice/comfort care              Anticipated d/c date is: 1-2 days              Patient currently is  medically stable to d/c.  Consultants:  Neurology  Palliative care  Procedures:  None  Antibiotics:  . None  Anti-infectives (From admission, onward)   None      Subjective: Today, patient was seen and examined at bedside.  Denies any pain shortness of breath.  Less verbal today.  Objective: Vitals:   01/21/20 2045 01/22/20 0500  BP:  116/71 125/78  Pulse: 96 100  Resp: 18 20  Temp: 97.8 F (36.6 C) 97.9 F (36.6 C)  SpO2: 91% 93%    Intake/Output Summary (Last 24 hours) at 01/22/2020 0750 Last data filed at 01/22/2020 0500 Gross per 24 hour  Intake 840 ml  Output 1300 ml  Net -460 ml   Filed Weights   01/19/20 0451 01/20/20 2054 01/21/20 2045  Weight: 69.9 kg 70.7 kg 73.3 kg   Body mass index is 31.56 kg/m.    Physical Exam: GENERAL: Patient is alert awake and mildly communicative, chronically ill, elderly female.   HENT: Mild pallor noted. Pupils equally reactive to light. Oral mucosa is moist NECK: is supple, no gross swelling noted. CHEST: Diminished breath sounds bilaterally.  Left chest wall AICD in place. CVS: S1 and S2 heard, murmur noted. Regular rate and rhythm.  ABDOMEN: Soft, non-tender, bowel sounds are present.  External urinary catheter in place. EXTREMITIES: No edema. CNS: Cranial nerves are intact.  Moves all extremities. SKIN: warm and dry without rashes.  Data Review: I have personally reviewed the following laboratory data and studies,  CBC: Recent Labs  Lab 01/18/20 1704 01/19/20 0144 01/20/20 0121 01/22/20 0428  WBC 7.8 7.9 7.1 7.1  NEUTROABS 6.1  --  5.3  --   HGB 14.4 14.0 12.2 13.0  HCT 47.5* 45.5 38.3 41.4  MCV 108.0* 108.6* 105.5* 105.9*  PLT 52* 46* 42* 40*   Basic Metabolic Panel: Recent Labs  Lab 01/18/20 1704 01/18/20 1704 01/18/20 2147 01/19/20 0144 01/19/20 0608 01/19/20 1337 01/20/20 0121 01/20/20 1140 01/21/20 0310 01/22/20 0428  NA 139   < >  --  142  --  144  --  139 141 141  K >7.5*   < > 5.9* 6.3*   < > 5.7* 3.5 3.2* 3.1* 4.0  CL 108   < >  --  111  --  111  --  104 102 101  CO2 13*   < >  --  12*  --  14*  --  20* 22 24  GLUCOSE 78   < >  --  73  --  81  --  148* 126* 110*  BUN 119*   < >  --  116*  --  113*  --  97* 90* 75*  CREATININE 7.26*   < >  --  7.12*  --  6.56*  --  5.26* 4.43* 3.56*  CALCIUM 10.1   < >  --  9.8  --  9.2  --  8.4* 8.4* 9.0  MG 3.2*  --   --   --   --   --  2.7*  --   --  2.2  PHOS  --   --  6.8*  --   --   --   --   --   --  3.7   < > = values in this interval not displayed.   Liver Function Tests: Recent Labs  Lab 01/18/20 1704 01/22/20 0428  AST 24 30  ALT 16 14  ALKPHOS 182* 136*  BILITOT 5.1* 3.6*  PROT 6.6 5.5*  ALBUMIN 3.9 3.1*   No results for input(s): LIPASE, AMYLASE in the last 168  hours. No results for input(s): AMMONIA in the last 168 hours. Cardiac Enzymes: No results for input(s): CKTOTAL, CKMB, CKMBINDEX, TROPONINI in the last 168 hours. BNP (last 3 results) Recent Labs    01/18/20 1704  BNP >4,500.0*    ProBNP (last  3 results) No results for input(s): PROBNP in the last 8760 hours.  CBG: No results for input(s): GLUCAP in the last 168 hours. Recent Results (from the past 240 hour(s))  SARS Coronavirus 2 by RT PCR (hospital order, performed in [facility] hospital lab) Nasopharyngeal Nasopharyngeal Swab     Status: None   Collection Time: 01/18/20  3:38 PM   Specimen: Nasopharyngeal Swab  Result Value Ref Range Status   SARS Coronavirus 2 NEGATIVE NEGATIVE Final    Comment: (NOTE) SARS-CoV-2 target nucleic acids are NOT DETECTED.  The SARS-CoV-2 RNA is generally detectable in upper and lower respiratory specimens during the acute phase of infection. The lowest concentration of SARS-CoV-2 viral copies this assay can detect is 250 copies / mL. A negative result does not preclude SARS-CoV-2 infection and should not be used as the sole basis for treatment or other patient management decisions.  A negative result may occur with improper specimen collection / handling, submission of specimen other than nasopharyngeal swab, presence of viral mutation(s) within the areas targeted by this assay, and inadequate number of viral copies (<250 copies / mL). A negative result must be combined with clinical observations, patient history, and epidemiological information.  Fact Sheet for Patients:   StrictlyIdeas.no  Fact Sheet for Healthcare Providers: BankingDealers.co.za  This test is not yet approved or  cleared by the [place] FDA and has been authorized for detection and/or diagnosis of SARS-CoV-2 by FDA under an Emergency Use Authorization (EUA).  This EUA will remain in effect (meaning this test can be  used) for the duration of the COVID-19 declaration under Section 564(b)(1) of the Act, 21 U.S.C. section 360bbb-3(b)(1), unless the authorization is terminated or revoked sooner.  Performed at [facility], [place] [address], [place], [place] [address]   Urine culture     Status: Abnormal   Collection Time: 01/18/20  8:07 PM   Specimen: Urine, Random  Result Value Ref Range Status   Specimen Description URINE, RANDOM  Final   Special Requests   Final    NONE Performed at [facility], [place] [address], [place], [place] [address]    Culture >=100,000 COLONIES/mL ESCHERICHIA COLI (A)  Final   Report Status 01/20/2020 FINAL  Final   Organism ID, Bacteria ESCHERICHIA COLI (A)  Final      Susceptibility   Escherichia coli - MIC*    AMPICILLIN <=2 SENSITIVE Sensitive     CEFAZOLIN <=4 SENSITIVE Sensitive     CEFTRIAXONE <=0.25 SENSITIVE Sensitive     CIPROFLOXACIN <=0.25 SENSITIVE Sensitive     GENTAMICIN <=1 SENSITIVE Sensitive     IMIPENEM <=0.25 SENSITIVE Sensitive     NITROFURANTOIN <=16 SENSITIVE Sensitive     TRIMETH/SULFA <=20 SENSITIVE Sensitive     AMPICILLIN/SULBACTAM <=2 SENSITIVE Sensitive     PIP/TAZO <=4 SENSITIVE Sensitive     * >=100,000 COLONIES/mL ESCHERICHIA COLI     Studies: No results found.    Flora Lipps, MD  [facility] 01/22/2020

## 2020-01-22 NOTE — Progress Notes (Signed)
° ° °  Patient resting comfortably in bed. No acute distress. Will awake to verbal stimuli. Alert to self only. Continues to have poor appetite. Full comfort measures. No family at the bedside.   Family updated via phone. Understands once bed is available patient may transfer to [facility]. Education provided on expectations at EOL. Family verbalized understanding and appreciation of support and care given. All questions answered and support given.   Assessment -NAD, chronically-ill appearing -somewhat somnolent but will awake to verbal stimuli, alert to self only -normal breathing pattern  Plam -continue full comfort care measures -transfer to [facility] once bed is available. Family understands patient could possibly pass away while hospitalized prior to bed available.  -Ativan PRN for anxiety/agitation -PMT will continue to support and follow as needed.   Time Total: 25 min.   Visit consisted of counseling and education dealing with the complex and emotionally intense issues of symptom management and palliative care in the setting of serious and potentially life-threatening illness.Greater than 50%  of this time was spent counseling and coordinating care related to the above assessment and plan.  Alda Lea, AGPCNP-BC  Palliative Medicine Team [phone]

## 2020-01-23 DIAGNOSIS — Z515 Encounter for palliative care: Secondary | ICD-10-CM

## 2020-01-23 DIAGNOSIS — R531 Weakness: Secondary | ICD-10-CM | POA: Diagnosis not present

## 2020-01-23 DIAGNOSIS — Z789 Other specified health status: Secondary | ICD-10-CM

## 2020-01-23 DIAGNOSIS — N179 Acute kidney failure, unspecified: Secondary | ICD-10-CM | POA: Diagnosis not present

## 2020-01-23 DIAGNOSIS — R5383 Other fatigue: Secondary | ICD-10-CM

## 2020-01-23 DIAGNOSIS — Z66 Do not resuscitate: Secondary | ICD-10-CM

## 2020-01-23 DIAGNOSIS — E875 Hyperkalemia: Secondary | ICD-10-CM | POA: Diagnosis not present

## 2020-01-23 DIAGNOSIS — I5022 Chronic systolic (congestive) heart failure: Secondary | ICD-10-CM | POA: Diagnosis not present

## 2020-01-23 NOTE — Plan of Care (Signed)
  Problem: Health Behavior/Discharge Planning: Goal: Ability to manage health-related needs will improve Outcome: Progressing   

## 2020-01-23 NOTE — Progress Notes (Signed)
Daily Progress Note   Patient Name: Casey [name]       Date: 01/23/2020 DOB: June 23, 1934  Age: 85 y.o. MRN#: [id number] Attending Physician: Flora Lipps, MD Primary Care Physician: Elby Showers, MD Admit Date: 01/18/2020  Reason for Consultation/Follow-up: Non pain symptom management, Pain control, Psychosocial/spiritual support and Terminal Care  Subjective: Chart review performed. Report received from primary RN - no acute concerns.  Went to visit patient at bedside - no family present. She was resting comfortably and did not awake to voice or gentle touch. No signs or non-verbal gestures of pain or discomfort noted. No respiratory distress, increased work of breathing, or secretions noted.   Called Lenette/daughter on the phone and spoke with her and her husband. Provided update on patient's current condition and per notes, still waiting for bed availability at [facility]. Family understands that if a bed does not become available the patient may pass away in the hospital. Lenette asked how the patient's kidney function was today. We discussed that because the patient is now focused on full comfort, we stop all uneccessary measures such as blood draws, needle sticks, and frequent vital signs - she was in understanding. Updated that the last set of blood work was done yesterday and provided information on what the trend has been over the last several days.   Lenette and her husband expressed appreciation for the call and update this morning.  Addressed all questions and concerns. Encouraged to call PMT or floor staff with questions/concerns. PMT number was provided.  Length of Stay: 5  Current Medications: Scheduled Meds:  . allopurinol  100 mg Oral Daily    Continuous  Infusions:   PRN Meds: acetaminophen **OR** acetaminophen, antiseptic oral rinse, fentaNYL (SUBLIMAZE) injection, glycopyrrolate, haloperidol lactate, LORazepam, ondansetron (ZOFRAN) IV, polyvinyl alcohol, senna  Physical Exam Vitals and nursing note reviewed.  Constitutional:      General: She is not in acute distress.    Appearance: She is ill-appearing.  Pulmonary:     Effort: No respiratory distress.  Skin:    General: Skin is warm and dry.  Neurological:     Mental Status: She is lethargic.             Vital Signs: BP 95/60 (BP Location: Right Arm)   Pulse 84   Temp 99.2  F (37.3 C) (Oral)   Resp 18   Ht 5' (1.524 m)   Wt 73.3 kg   SpO2 92%   BMI 31.56 kg/m  SpO2: SpO2: 92 % O2 Device: O2 Device: Room Air O2 Flow Rate:    Intake/output summary:   Intake/Output Summary (Last 24 hours) at 01/23/2020 1044 Last data filed at 01/23/2020 0900 Gross per 24 hour  Intake 220 ml  Output 400 ml  Net -180 ml   LBM: Last BM Date: 01/19/20 Baseline Weight: Weight: 68 kg Most recent weight: Weight: 73.3 kg       Palliative Assessment/Data: PPS 20%      Patient Active Problem List   Diagnosis Date Noted  . General weakness   . Uremia   . Hyperkalemia 01/18/2020  . Acute renal failure ([place]) 01/18/2020  . Encounter for therapeutic drug monitoring 12/05/2019  . Benign paroxysmal positional vertigo of left ear 02/14/2018  . Neurocognitive deficits 01/22/2018  . Closed displaced bimalleolar fracture of left lower leg   . History of peptic ulcer disease   . Atherosclerosis of aorta ([place])   . Bilateral hearing loss 12/09/2015  . Kidney disease, chronic, stage III (GFR 30-59 ml/min) (HCC)   . Ventricular septal defect (VSD)   . Long-term (current) use of anticoagulants   . Hypothyroidism 03/11/2013  . Gout   . Vitamin D deficiency 03/06/2011  . Glaucoma   . Anxiety   . Spinal stenosis   . Biventricular implantable cardioverter-defibrillator in situ   .  Nonischemic cardiomyopathy ([place])   . Paroxysmal atrial fibrillation (HCC)   . Hyperlipidemia   . Ventricular tachycardia   . Hypertensive heart disease   . Chronic systolic CHF (congestive heart failure) [facility])     Palliative Care Assessment & Plan   Patient Profile: Palliative Care consult requested for goals of care discussion in this85 y.o.femalewith multiple medical problems including ischemic cardiomyopathy with last EF 15-20%, CKD III,hypertension, HLD, hypothyroidism.She presented to the ED with concerns for increased confusion and weakness. BNP >4500, TSH 14.78, Cr 7.5, K 7.5, and BUN 119 on admission. She is being followed by Nephrology with recommendations for medical management, patient is not a long term HD candidate.   Assessment: Chronic systolic CHF Nonischemia cardiomyopathy Atrial fibrillation Hyperkalemia Chronic kidney disease Acute renal failure Generalized weakness Uremia Thrombocytopenia  Terminal care  Recommendations/Plan: -Continue full comfort measures -Continue DNR/DNI as previously documented -Transfer to [facility] when/if bed becomes available. Family is aware the patient may pass away in the hospital. -Provide frequent assessments and administer PRN medications as clinically necessary to ensure EOL comfort -PMT will continue to follow holistically   Goals of Care and Additional Recommendations:  Limitations on Scope of Treatment: Full Comfort Care  Code Status:    Code Status Orders  (From admission, onward)         Start     Ordered   01/21/20 1152  Do not attempt resuscitation (DNR)  Continuous       Question Answer Comment  In the event of cardiac or respiratory ARREST Do not call a "code blue"   In the event of cardiac or respiratory ARREST Do not perform Intubation, CPR, defibrillation or ACLS   In the event of cardiac or respiratory ARREST Use medication by any route, position, wound care, and other measures to relive pain  and suffering. May use oxygen, suction and manual treatment of airway obstruction as needed for comfort.      01/21/20 1152  Code Status History    Date Active Date Inactive Code Status Order ID Comments User Context   01/19/2020 0855 01/21/2020 1152 DNR [id number]  Domenic Polite, MD Inpatient   01/18/2020 2015 01/19/2020 0854 Full Code [id number]  Neena Rhymes, MD ED   01/02/2018 0122 01/09/2018 2327 Full Code [id number]  Rise Patience, MD ED   07/06/2017 1653 07/11/2017 1848 Full Code [id number]  Jacolyn Reedy, MD Inpatient   04/16/2013 1205 04/16/2013 1703 Full Code [id number]  Deboraha Sprang, MD Inpatient   Advance Care Planning Activity       Prognosis:   < 2 weeks  Discharge Planning:  Hospice facility may become hospital death pending bed availability at [facility]  Care plan was discussed with primary RN, Lenette/daughter and her husband  Thank you for allowing the Palliative Medicine Team to assist in the care of this patient.   Total Time 25 minutes Prolonged Time Billed  no       Greater than 50%  of this time was spent counseling and coordinating care related to the above assessment and plan.  Lin Landsman, NP  Please contact Palliative Medicine Team phone at [phone] for questions and concerns.

## 2020-01-23 NOTE — Progress Notes (Signed)
Manufacturing engineer [facility])  [facility] does not have a bed for Casey [name] today.  [facility] will update family and TOC manager once a bed is available.  Venia Carbon RN, BSN, [facility] Liaison

## 2020-01-23 NOTE — Progress Notes (Signed)
PROGRESS NOTE  Casey [name] BJY:[id number] DOB: 1935-04-15 DOA: 01/18/2020 PCP: Elby Showers, MD   LOS: 5 days   Brief narrative: As per HPI,  Casey [name] is an 85/F with past history of severe non ischemic cardiomyopathy, EF of 10-15%, with AICD, moderate to severe mitral regurgitation history of V. tach, stage IV chronic kidney disease, hypothyroidism, hypertension, dyslipidemia, peptic ulcer disease was brought to the ED by EMS for worsening confusion.  Per report, patient does have history of hallucinations for the last 1 to 2 months, she lives by herself but has a neighbor checking in on her, providing food etc. She is also very hard of hearing, when EMS arrived blood pressure was 80/62, labs noted potassium greater than 7.5 and creatinine of 7.2, chest x-ray noted cardiomegaly, no edema. Nephrology was consulted and she was not felt to be a good dialysis candidate and patient indicated in the past that she would not want hemodialysis. She was started on medical treatment for hyperkalemia and sodium bicarbonate fluids. Palliative team was consulted, renal has signed off and noted that she is not a dialysis candidate.  Assessment/Plan:  Active Problems:   Chronic systolic CHF (congestive heart failure) (HCC)   Nonischemic cardiomyopathy (HCC)   Paroxysmal atrial fibrillation (HCC)   Hypothyroidism   Kidney disease, chronic, stage III (GFR 30-59 ml/min) (HCC)   Hyperkalemia   Acute renal failure (HCC)   General weakness   Uremia   Acute renal failure superimposed on stage 4 chronic kidney disease ([place])   Palliative care by specialist   Terminal care   Lethargic   Generalized weakness   Comfort measures only status   DNR (do not resuscitate)   DNI (do not intubate)  AKI on CKD stage IV/Severe hyperkalemia/Severe metabolic acidosis/Suspected uremia Patient presented with creatinine of 7.2 and potassium of 7.5 with severe metabolic acidosis.  Patient received a  sodium bicarb during hospitalization and received temporizing measures for hyperkalemia.   Patient with poor urinary output and is not a dialysis candidate as per nephrology.  Palliative care was consulted and due to severe cardiomyopathy with ejection fraction of 10%, advanced age, debility and she has been considered for hospice care at this time.  Mild hypokalemia.  Replenished orally. Potassium of 4.0 on 01/22/20. No further labs.   Acute on chronic thrombocytopenia No evidence of bleeding at this time.  Latest platelet count of 40,000.   Chronic systolic CHF, EF 08-65%/HQIONGEX to severe mitral regurgitation Status post AICD/ Diuretics on hold.  Off toprol.   Paroxysmal atrial fibrillation Off toprol and coumadin   History of hypothyroidism -TSH is very high, off synthroid  Acute metabolic encephalopathy. Present on admission.   DVT prophylaxis: off warfarin   Code Status: DO NOT RESUSCITATE  Family Communication: None. Palliative care had a prolonged discussion with the family on 8/12. At this time, plan is  hospice level of care.  Status is: Inpatient  Remains inpatient appropriate because:Inpatient level of care appropriate due to severity of illness, awaiting residential hospice   Dispo: The patient is from: Home              Anticipated d/c is to: Residential hospice/ for omfort care              Anticipated d/c date is: 1-2 days              Patient currently is  medically stable to d/c.  Consultants:  Neurology  Palliative care  Procedures:  None  Antibiotics:  . None  Anti-infectives (From admission, onward)   None      Subjective: Today, patient was seen and examined at bedside.  Patient denies any pain, nausea, vomiting, shortness of breath.   Objective: Vitals:   01/22/20 2035 01/23/20 0929  BP: 112/72 95/60  Pulse: (!) 106 84  Resp: 19 18  Temp: 98.1 F (36.7 C) 99.2 F (37.3 C)  SpO2: 96% 92%    Intake/Output Summary (Last 24  hours) at 01/23/2020 1337 Last data filed at 01/23/2020 1255 Gross per 24 hour  Intake 340 ml  Output 400 ml  Net -60 ml   Filed Weights   01/19/20 0451 01/20/20 2054 01/21/20 2045  Weight: 69.9 kg 70.7 kg 73.3 kg   Body mass index is 31.56 kg/m.   Physical Exam: GENERAL: Patient is alert awake and mildly communicative, chronically ill, elderly female.   HENT: Mild pallor noted. Pupils equally reactive to light. Oral mucosa is moist NECK: is supple, no gross swelling noted. CHEST: Diminished breath sounds bilaterally.  Left chest wall AICD in place. CVS: S1 and S2 heard, murmur noted. Regular rate and rhythm.  ABDOMEN: Soft, non-tender, bowel sounds are present.  External urinary catheter in place. EXTREMITIES: No edema. CNS: Cranial nerves are intact.  Moves all extremities. SKIN: warm and dry without rashes.  Data Review: I have personally reviewed the following laboratory data and studies,  CBC: Recent Labs  Lab 01/18/20 1704 01/19/20 0144 01/20/20 0121 01/22/20 0428  WBC 7.8 7.9 7.1 7.1  NEUTROABS 6.1  --  5.3  --   HGB 14.4 14.0 12.2 13.0  HCT 47.5* 45.5 38.3 41.4  MCV 108.0* 108.6* 105.5* 105.9*  PLT 52* 46* 42* 40*   Basic Metabolic Panel: Recent Labs  Lab 01/18/20 1704 01/18/20 1704 01/18/20 2147 01/19/20 0144 01/19/20 0608 01/19/20 1337 01/20/20 0121 01/20/20 1140 01/21/20 0310 01/22/20 0428  NA 139   < >  --  142  --  144  --  139 141 141  K >7.5*   < > 5.9* 6.3*   < > 5.7* 3.5 3.2* 3.1* 4.0  CL 108   < >  --  111  --  111  --  104 102 101  CO2 13*   < >  --  12*  --  14*  --  20* 22 24  GLUCOSE 78   < >  --  73  --  81  --  148* 126* 110*  BUN 119*   < >  --  116*  --  113*  --  97* 90* 75*  CREATININE 7.26*   < >  --  7.12*  --  6.56*  --  5.26* 4.43* 3.56*  CALCIUM 10.1   < >  --  9.8  --  9.2  --  8.4* 8.4* 9.0  MG 3.2*  --   --   --   --   --  2.7*  --   --  2.2  PHOS  --   --  6.8*  --   --   --   --   --   --  3.7   < > = values in this  interval not displayed.   Liver Function Tests: Recent Labs  Lab 01/18/20 1704 01/22/20 0428  AST 24 30  ALT 16 14  ALKPHOS 182* 136*  BILITOT 5.1* 3.6*  PROT 6.6 5.5*  ALBUMIN 3.9 3.1*   No results for input(s): LIPASE, AMYLASE  in the last 168 hours. No results for input(s): AMMONIA in the last 168 hours. Cardiac Enzymes: No results for input(s): CKTOTAL, CKMB, CKMBINDEX, TROPONINI in the last 168 hours. BNP (last 3 results) Recent Labs    01/18/20 1704  BNP >4,500.0*    ProBNP (last 3 results) No results for input(s): PROBNP in the last 8760 hours.  CBG: No results for input(s): GLUCAP in the last 168 hours. Recent Results (from the past 240 hour(s))  SARS Coronavirus 2 by RT PCR (hospital order, performed in [facility] hospital lab) Nasopharyngeal Nasopharyngeal Swab     Status: None   Collection Time: 01/18/20  3:38 PM   Specimen: Nasopharyngeal Swab  Result Value Ref Range Status   SARS Coronavirus 2 NEGATIVE NEGATIVE Final    Comment: (NOTE) SARS-CoV-2 target nucleic acids are NOT DETECTED.  The SARS-CoV-2 RNA is generally detectable in upper and lower respiratory specimens during the acute phase of infection. The lowest concentration of SARS-CoV-2 viral copies this assay can detect is 250 copies / mL. A negative result does not preclude SARS-CoV-2 infection and should not be used as the sole basis for treatment or other patient management decisions.  A negative result may occur with improper specimen collection / handling, submission of specimen other than nasopharyngeal swab, presence of viral mutation(s) within the areas targeted by this assay, and inadequate number of viral copies (<250 copies / mL). A negative result must be combined with clinical observations, patient history, and epidemiological information.  Fact Sheet for Patients:   StrictlyIdeas.no  Fact Sheet for Healthcare  Providers: BankingDealers.co.za  This test is not yet approved or  cleared by the [place] FDA and has been authorized for detection and/or diagnosis of SARS-CoV-2 by FDA under an Emergency Use Authorization (EUA).  This EUA will remain in effect (meaning this test can be used) for the duration of the COVID-19 declaration under Section 564(b)(1) of the Act, 21 U.S.C. section 360bbb-3(b)(1), unless the authorization is terminated or revoked sooner.  Performed at [facility], [place] [address], [place], [place] [address]   Urine culture     Status: Abnormal   Collection Time: 01/18/20  8:07 PM   Specimen: Urine, Random  Result Value Ref Range Status   Specimen Description URINE, RANDOM  Final   Special Requests   Final    NONE Performed at [facility], [place] [address], [place], [place] [address]    Culture >=100,000 COLONIES/mL ESCHERICHIA COLI (A)  Final   Report Status 01/20/2020 FINAL  Final   Organism ID, Bacteria ESCHERICHIA COLI (A)  Final      Susceptibility   Escherichia coli - MIC*    AMPICILLIN <=2 SENSITIVE Sensitive     CEFAZOLIN <=4 SENSITIVE Sensitive     CEFTRIAXONE <=0.25 SENSITIVE Sensitive     CIPROFLOXACIN <=0.25 SENSITIVE Sensitive     GENTAMICIN <=1 SENSITIVE Sensitive     IMIPENEM <=0.25 SENSITIVE Sensitive     NITROFURANTOIN <=16 SENSITIVE Sensitive     TRIMETH/SULFA <=20 SENSITIVE Sensitive     AMPICILLIN/SULBACTAM <=2 SENSITIVE Sensitive     PIP/TAZO <=4 SENSITIVE Sensitive     * >=100,000 COLONIES/mL ESCHERICHIA COLI     Studies: No results found.    Flora Lipps, MD  [facility] 01/23/2020

## 2020-01-24 DIAGNOSIS — N179 Acute kidney failure, unspecified: Secondary | ICD-10-CM | POA: Diagnosis not present

## 2020-01-24 DIAGNOSIS — I5022 Chronic systolic (congestive) heart failure: Secondary | ICD-10-CM | POA: Diagnosis not present

## 2020-01-24 DIAGNOSIS — R531 Weakness: Secondary | ICD-10-CM | POA: Diagnosis not present

## 2020-01-24 DIAGNOSIS — Z515 Encounter for palliative care: Secondary | ICD-10-CM | POA: Diagnosis not present

## 2020-01-24 MED ORDER — ACETAMINOPHEN 325 MG PO TABS: 650.0000 mg | ORAL_TABLET | Freq: Four times a day (QID) | ORAL | Status: AC | PRN

## 2020-01-24 NOTE — Progress Notes (Addendum)
Manufacturing engineer [facility])  [facility] has a bed for Ms. Brott.  Once necessary consents are complete, transportation can be arranged to [facility].  [facility] will update TOC manager once completed.  RN staff, please call report to [phone].  AICD needs to be deactivated prior to transporting.  Please fax d/c summary to [phone]  Venia Carbon RN, BSN, [facility] Liaison   **all consents are completed and transport can be arranged to [facility].  Please contact dtr Lenette when transport arrives so she can anticipate when she will get to [facility].

## 2020-01-24 NOTE — TOC Transition Note (Signed)
Transition of Care [facility]) - CM/SW Discharge Note   Patient Details  Name: Casey [name] MRN: [id number] Date of Birth: 1935-05-31  Transition of Care [facility]) CM/SW Contact:  Varney Baas Phone Number: 01/24/2020, 1:10 PM   Clinical Narrative:    Patient will DC to: Glass blower/designer by: Corey Harold  RN, patient's family, and facility notified of DC. Discharge Summary sent to facility. RN to call report prior to discharge [phone]). DC packet on chart. Ambulance transport will be requested for patient once PID turned off.  CSW will sign off for now as social work intervention is no longer needed. Please consult [place] again if new needs arise.    Final next level of care: [place] Barriers to Discharge: No Barriers Identified   Patient Goals and CMS Choice   CMS Medicare.gov Compare Post Acute Care list provided to:: Patient Represenative (must comment) (Lenette) Choice offered to / list presented to : Adult Children  Discharge Placement                Patient to be transferred to facility by: [place] Name of family member notified: Lenette Patient and family notified of of transfer: 01/24/20  Discharge Plan and Services                                     Social Determinants of Health (SDOH) Interventions     Readmission Risk Interventions No flowsheet data found.

## 2020-01-24 NOTE — Progress Notes (Signed)
Called [employer] @ [phone], to request de-activation of AICD.  Verified patient has a [employer] AICD. Local [employer] Rep is: Leanna Sato Waiting on call back at this time. Ivan with [employer] called back @ 13:31, stating that he was currently in [place], [place].  He estimated that he would be at [facility] in about 30 minutes. At 16:18, confirmed with Leanna Sato of [employer], that patient's ICD is off.   Galen Russman R

## 2020-01-24 NOTE — Progress Notes (Deleted)
PROGRESS NOTE  BETSI CRESPI ZWC:[id number] DOB: 1935/04/28 DOA: 01/18/2020 PCP: Elby Showers, MD   LOS: 6 days   Brief narrative: As per HPI,  Miyonna Ormiston is an 85/F with past history of severe non ischemic cardiomyopathy, EF of 10-15%, with AICD, moderate to severe mitral regurgitation history of V. tach, stage IV chronic kidney disease, hypothyroidism, hypertension, dyslipidemia, peptic ulcer disease was brought to the ED by EMS for worsening confusion.  Per report, patient does have history of hallucinations for the last 1 to 2 months, she lives by herself but has a neighbor checking in on her, providing food etc. She is also very hard of hearing, when EMS arrived blood pressure was 80/62, labs noted potassium greater than 7.5 and creatinine of 7.2, chest x-ray noted cardiomegaly, no edema. Nephrology was consulted and she was not felt to be a good dialysis candidate and patient indicated in the past that she would not want hemodialysis. She was started on medical treatment for hyperkalemia and sodium bicarbonate, IV fluids. Palliative team was consulted, renal has signed off and noted that she is not a dialysis candidate.  Assessment/Plan:  Active Problems:   Chronic systolic CHF (congestive heart failure) (HCC)   Nonischemic cardiomyopathy (HCC)   Paroxysmal atrial fibrillation (HCC)   Hypothyroidism   Kidney disease, chronic, stage III (GFR 30-59 ml/min) (HCC)   Hyperkalemia   Acute renal failure (HCC)   General weakness   Uremia   Acute renal failure superimposed on stage 4 chronic kidney disease ([place])   Palliative care by specialist   Terminal care   Lethargic   Generalized weakness   Comfort measures only status   DNR (do not resuscitate)   DNI (do not intubate)  AKI on CKD stage IV/Severe hyperkalemia/Severe metabolic acidosis/Suspected uremia Patient presented with creatinine of 7.2 and potassium of 7.5 with severe metabolic acidosis.  Patient received a  sodium bicarb during hospitalization and received temporizing measures for hyperkalemia.   Patient with poor urinary output and is not a dialysis candidate as per nephrology.  Palliative care was consulted and due to severe cardiomyopathy with ejection fraction of 10%, advanced age, debility and she has been considered for hospice care at this time.  Mild hypokalemia.  Replenished orally. Potassium of 4.0 on 01/22/20. No further labs.   Acute on chronic thrombocytopenia No evidence of bleeding at this time.  Latest platelet count of 40,000.   Chronic systolic CHF, EF 23-53%/IRWERXVQ to severe mitral regurgitation Status post AICD/ Diuretics on hold.  Off toprol.   Paroxysmal atrial fibrillation Off toprol and coumadin   History of hypothyroidism -TSH is very high, off synthroid  Acute metabolic encephalopathy. Present on admission.   DVT prophylaxis: off warfarin  Code Status: DO NOT RESUSCITATE  Family Communication: None. Palliative care had a prolonged discussion with the family on 8/12. At this time, plan is  hospice level of care.  Status is: Inpatient  Remains inpatient appropriate because:Inpatient level of care appropriate due to severity of illness, awaiting residential hospice   Dispo: The patient is from: Home              Anticipated d/c is to: Residential hospice/ for comfort care              Anticipated d/c date is: 1-2 days              Patient currently is  medically stable to d/c.  Consultants:  Neurology  Palliative care  Procedures:  None  Antibiotics:   None  Anti-infectives (From admission, onward)   None      Subjective: Today, patient was seen and examined at bedside. Patient denies any pain, nausea vomiting. Appears comfortable  Objective: Vitals:   01/23/20 0929 01/23/20 2046  BP: 95/60 107/62  Pulse: 84 (!) 108  Resp: 18 17  Temp: 99.2 F (37.3 C) 97.8 F (36.6 C)  SpO2: 92% 93%    Intake/Output Summary (Last 24 hours) at  01/24/2020 1046 Last data filed at 01/24/2020 0600 Gross per 24 hour  Intake 480 ml  Output 800 ml  Net -320 ml   Filed Weights   01/19/20 0451 01/20/20 2054 01/21/20 2045  Weight: 69.9 kg 70.7 kg 73.3 kg   Body mass index is 31.56 kg/m.   Physical Exam: General: Patient is alert awake and mildly communicative, chronically ill, elderly female, not in obvious distress HENT:   No scleral pallor or icterus noted. Oral mucosa is moist.  Chest:  Diminished breath sounds bilaterally.. Left chest wall AICD in place. CVS: S1 &S2 heard. No murmur.  Regular rate and rhythm. Abdomen: Soft, nontender, nondistended.  Bowel sounds are heard. External urinary catheter in place. Extremities: No cyanosis, clubbing or edema.  Peripheral pulses are palpable. Psych: Alert, awake and oriented, normal mood CNS:  No cranial nerve deficits.  Power equal in all extremities.   Skin: Warm and dry.  No rashes noted.   Data Review: I have personally reviewed the following laboratory data and studies,  CBC: Recent Labs  Lab 01/18/20 1704 01/19/20 0144 01/20/20 0121 01/22/20 0428  WBC 7.8 7.9 7.1 7.1  NEUTROABS 6.1  --  5.3  --   HGB 14.4 14.0 12.2 13.0  HCT 47.5* 45.5 38.3 41.4  MCV 108.0* 108.6* 105.5* 105.9*  PLT 52* 46* 42* 40*   Basic Metabolic Panel: Recent Labs  Lab 01/18/20 1704 01/18/20 1704 01/18/20 2147 01/19/20 0144 01/19/20 0608 01/19/20 1337 01/20/20 0121 01/20/20 1140 01/21/20 0310 01/22/20 0428  NA 139   < >  --  142  --  144  --  139 141 141  K >7.5*   < > 5.9* 6.3*   < > 5.7* 3.5 3.2* 3.1* 4.0  CL 108   < >  --  111  --  111  --  104 102 101  CO2 13*   < >  --  12*  --  14*  --  20* 22 24  GLUCOSE 78   < >  --  73  --  81  --  148* 126* 110*  BUN 119*   < >  --  116*  --  113*  --  97* 90* 75*  CREATININE 7.26*   < >  --  7.12*  --  6.56*  --  5.26* 4.43* 3.56*  CALCIUM 10.1   < >  --  9.8  --  9.2  --  8.4* 8.4* 9.0  MG 3.2*  --   --   --   --   --  2.7*  --   --  2.2   PHOS  --   --  6.8*  --   --   --   --   --   --  3.7   < > = values in this interval not displayed.   Liver Function Tests: Recent Labs  Lab 01/18/20 1704 01/22/20 0428  AST 24 30  ALT 16 14  ALKPHOS 182* 136*  BILITOT 5.1* 3.6*  PROT 6.6 5.5*  ALBUMIN 3.9 3.1*   No results for input(s): LIPASE, AMYLASE in the last 168 hours. No results for input(s): AMMONIA in the last 168 hours. Cardiac Enzymes: No results for input(s): CKTOTAL, CKMB, CKMBINDEX, TROPONINI in the last 168 hours. BNP (last 3 results) Recent Labs    01/18/20 1704  BNP >4,500.0*    ProBNP (last 3 results) No results for input(s): PROBNP in the last 8760 hours.  CBG: No results for input(s): GLUCAP in the last 168 hours. Recent Results (from the past 240 hour(s))  SARS Coronavirus 2 by RT PCR (hospital order, performed in [facility] hospital lab) Nasopharyngeal Nasopharyngeal Swab     Status: None   Collection Time: 01/18/20  3:38 PM   Specimen: Nasopharyngeal Swab  Result Value Ref Range Status   SARS Coronavirus 2 NEGATIVE NEGATIVE Final    Comment: (NOTE) SARS-CoV-2 target nucleic acids are NOT DETECTED.  The SARS-CoV-2 RNA is generally detectable in upper and lower respiratory specimens during the acute phase of infection. The lowest concentration of SARS-CoV-2 viral copies this assay can detect is 250 copies / mL. A negative result does not preclude SARS-CoV-2 infection and should not be used as the sole basis for treatment or other patient management decisions.  A negative result may occur with improper specimen collection / handling, submission of specimen other than nasopharyngeal swab, presence of viral mutation(s) within the areas targeted by this assay, and inadequate number of viral copies (<250 copies / mL). A negative result must be combined with clinical observations, patient history, and epidemiological information.  Fact Sheet for Patients:     StrictlyIdeas.no  Fact Sheet for Healthcare Providers: BankingDealers.co.za  This test is not yet approved or  cleared by the [place] FDA and has been authorized for detection and/or diagnosis of SARS-CoV-2 by FDA under an Emergency Use Authorization (EUA).  This EUA will remain in effect (meaning this test can be used) for the duration of the COVID-19 declaration under Section 564(b)(1) of the Act, 21 U.S.C. section 360bbb-3(b)(1), unless the authorization is terminated or revoked sooner.  Performed at [facility], [place] [address], [place], [place] [address]   Urine culture     Status: Abnormal   Collection Time: 01/18/20  8:07 PM   Specimen: Urine, Random  Result Value Ref Range Status   Specimen Description URINE, RANDOM  Final   Special Requests   Final    NONE Performed at [facility], [place] [address], [place], [place] [address]    Culture >=100,000 COLONIES/mL ESCHERICHIA COLI (A)  Final   Report Status 01/20/2020 FINAL  Final   Organism ID, Bacteria ESCHERICHIA COLI (A)  Final      Susceptibility   Escherichia coli - MIC*    AMPICILLIN <=2 SENSITIVE Sensitive     CEFAZOLIN <=4 SENSITIVE Sensitive     CEFTRIAXONE <=0.25 SENSITIVE Sensitive     CIPROFLOXACIN <=0.25 SENSITIVE Sensitive     GENTAMICIN <=1 SENSITIVE Sensitive     IMIPENEM <=0.25 SENSITIVE Sensitive     NITROFURANTOIN <=16 SENSITIVE Sensitive     TRIMETH/SULFA <=20 SENSITIVE Sensitive     AMPICILLIN/SULBACTAM <=2 SENSITIVE Sensitive     PIP/TAZO <=4 SENSITIVE Sensitive     * >=100,000 COLONIES/mL ESCHERICHIA COLI     Studies: No results found.    Flora Lipps, MD  [facility] 01/24/2020

## 2020-01-24 NOTE — Telephone Encounter (Signed)
Noted Unfortunatelydoes not sound like she would recognize a visitor, if she is still alive

## 2020-01-24 NOTE — Progress Notes (Signed)
Called and made patient's daughter, Rolanda Lundborg, aware that patient left via [facility] to [facility] at approximately 2115.  Earleen Reaper RN

## 2020-01-24 NOTE — Progress Notes (Signed)
Nursing report called to [facility], talked to [place], Therapist, sports.  [facility] reports that they did receive the discharge paperwork.  Patient's IV Removed and readied for discharge.  Notified patient's daughter, Rolanda Lundborg @ [phone], to let her know that [facility] place had been called.  Night shift RN to notify family, when ambulance transports patient to [facility].  Jillyn Ledger, MBA, MSN, RN

## 2020-01-24 NOTE — Discharge Summary (Addendum)
Physician Discharge Summary  Casey [name] IHK:[id number] DOB: 11-21-1934 DOA: 01/18/2020  PCP: Elby Showers, MD  Admit date: 01/18/2020 Discharge date: 01/24/2020  Admitted From: Home  Discharge disposition: Residential hospice   Recommendations for Outpatient Follow-Up:   . Follow up as per hospice provider  Discharge Diagnosis:   Active Problems:   Chronic systolic CHF (congestive heart failure) (HCC)   Nonischemic cardiomyopathy (HCC)   Paroxysmal atrial fibrillation (HCC)   Hypothyroidism   Kidney disease, chronic, stage III (GFR 30-59 ml/min) (HCC)   Hyperkalemia   Acute renal failure (HCC)   General weakness   Uremia   Acute renal failure superimposed on stage 4 chronic kidney disease ([place])   Palliative care by specialist   Terminal care   Lethargic   Generalized weakness   Comfort measures only status   DNR (do not resuscitate)   DNI (do not intubate)    Discharge Condition: Improved.  Diet recommendation:  Regular.  Wound care: None.  Code status: DNR   History of Present Illness:   Casey [name] is an 85/F with past history of severe non ischemic cardiomyopathy, EF of 10-15%, with AICD, moderate to severe mitral regurgitation history of V. tach, stage IV chronic kidney disease, hypothyroidism, hypertension, dyslipidemia, peptic ulcer disease was brought to the ED by EMS for worsening confusion. Per report, patient does have history of hallucinations for the last 1 to 2 months, she lives by herself but has a neighbor checking in on her, providing food etc. She is also very hard of hearing, when EMS arrived blood pressure was 80/62, labs noted potassium greater than 7.5 and creatinine of 7.2, chest x-ray noted cardiomegaly, no edema. Nephrology was consulted and she was not felt to be a good dialysis candidate and patient indicated in the past that she would not want hemodialysis. She was started on medical treatment for hyperkalemia and sodium  bicarbonate, IV fluids.   Hospital Course:   Following conditions were addressed during hospitalization as listed below,  AKI on CKD stage IV/Severe hyperkalemia/Severe metabolic acidosis/Suspected uremia Patient presented with creatinine of 7.2 and potassium of 7.5 with severe metabolic acidosis.  Patient received a sodium bicarb during hospitalization and received temporizing measures for hyperkalemia.   Patient with poor urinary output and is not a dialysis candidate as per nephrology.  Palliative care was consulted and due to severe cardiomyopathy with ejection fraction of 10%, advanced age, debility and she has been considered for hospice care at this time. AICD will be deactivated prior to discharge.  Mild hypokalemia.  Replenished orally. Potassium of 4.0 on 01/22/20. No further labs.  Acute on chronic thrombocytopenia No evidence of bleeding at this time.  Latest platelet count of 40,000.  Chronic systolic CHF, EF 75-64%/PPIRJJOA to severe mitral regurgitation Status post AICD/ Diuretics to resume for symptomatic relief.  Off toprol.  Paroxysmal atrial fibrillation Off toprol and coumadin  History of hypothyroidism -TSH is very high, off synthroid  Acute metabolic encephalopathy. Present on admission.   Disposition.  At this time, patient is stable for disposition to residential hospice.   Medical Consultants:    Nephrology  Palliative care  Procedures:    None Subjective:   Today, patient was seen and examined at bedside. Patient denies any pain, nausea vomiting. Appears comfortable  Discharge Exam:   Vitals:   01/23/20 0929 01/23/20 2046  BP: 95/60 107/62  Pulse: 84 (!) 108  Resp: 18 17  Temp: 99.2 F (37.3 C) 97.8 F (36.6 C)  SpO2: 92% 93%   Vitals:   01/22/20 0853 01/22/20 2035 01/23/20 0929 01/23/20 2046  BP: (!) 100/57 112/72 95/60 107/62  Pulse: 97 (!) 106 84 (!) 108  Resp: (!) 22 19 18 17   Temp: (!) 97.4 F (36.3 C) 98.1 F (36.7 C)  99.2 F (37.3 C) 97.8 F (36.6 C)  TempSrc: Oral  Oral   SpO2: 90% 96% 92% 93%  Weight:      Height:        General: Patient is alert awake and mildly communicative, chronically ill, elderly female, not in obvious distress HENT:   No scleral pallor or icterus noted. Oral mucosa is moist.  Chest:  Diminished breath sounds bilaterally.. Left chest wall AICD in place. CVS: S1 &S2 heard. No murmur.  Regular rate and rhythm. Abdomen: Soft, nontender, nondistended.  Bowel sounds are heard. External urinary catheter in place. Extremities: No cyanosis, clubbing or edema.  Peripheral pulses are palpable. Psych: Alert, awake and communicative. normal mood CNS:  No cranial nerve deficits.  Power equal in all extremities.   Skin: Warm and dry.  No rashes noted.   The results of significant diagnostics from this hospitalization (including imaging, microbiology, ancillary and laboratory) are listed below for reference.     Diagnostic Studies:   US RENAL  Result Date: 01/18/2020 CLINICAL DATA:  Acute renal failure EXAM: RENAL / URINARY TRACT ULTRASOUND COMPLETE COMPARISON:  June 16, 2015 FINDINGS: Right Kidney: Nonvisualized. There is a small nodule seen within the expected location of the right kidney which could represent the adrenal gland measuring 1.8 x 1.1 x 1.5 cm Left Kidney: Renal measurements: 10.2 x 5.7 x 5.1 cm = volume: 153 mL. Echogenicity within normal limits. Mild cortical thinning is seen. An anechoic cyst seen within the midpole measuring 1.2 x 1.0 x 1.3 cm. Bladder: Appears normal for degree of bladder distention. Other: None. IMPRESSION: Nonvisualized right kidney. Simple renal cyst within the left kidney.  No hydronephrosis. Electronically Signed   By: Prudencio Pair M.D.   On: 01/18/2020 23:13   DG Chest Portable 1 View  Result Date: 01/18/2020 CLINICAL DATA:  Weakness EXAM: PORTABLE CHEST 1 VIEW COMPARISON:  07/07/2017 FINDINGS: Left AICD remains in place, unchanged.  Cardiomegaly. No confluent opacities, effusions or overt edema. No acute bony abnormality. IMPRESSION: Cardiomegaly.  No active disease. Electronically Signed   By: Rolm Baptise M.D.   On: 01/18/2020 16:01     Labs:   Basic Metabolic Panel: Recent Labs  Lab 01/18/20 1704 01/18/20 1704 01/18/20 2147 01/19/20 0144 01/19/20 0608 01/19/20 1337 01/19/20 1337 01/20/20 0121 01/20/20 0121 01/20/20 1140 01/20/20 1140 01/21/20 0310 01/22/20 0428  NA 139   < >  --  142  --  144  --   --   --  139  --  141 141  K >7.5*   < > 5.9* 6.3*   < > 5.7*   < > 3.5   < > 3.2*   < > 3.1* 4.0  CL 108   < >  --  111  --  111  --   --   --  104  --  102 101  CO2 13*   < >  --  12*  --  14*  --   --   --  20*  --  22 24  GLUCOSE 78   < >  --  73  --  81  --   --   --  148*  --  126* 110*  BUN 119*   < >  --  116*  --  113*  --   --   --  97*  --  90* 75*  CREATININE 7.26*   < >  --  7.12*  --  6.56*  --   --   --  5.26*  --  4.43* 3.56*  CALCIUM 10.1   < >  --  9.8  --  9.2  --   --   --  8.4*  --  8.4* 9.0  MG 3.2*  --   --   --   --   --   --  2.7*  --   --   --   --  2.2  PHOS  --   --  6.8*  --   --   --   --   --   --   --   --   --  3.7   < > = values in this interval not displayed.   GFR Estimated Creatinine Clearance: 10.7 mL/min (A) (by C-G formula based on SCr of 3.56 mg/dL (H)). Liver Function Tests: Recent Labs  Lab 01/18/20 1704 01/22/20 0428  AST 24 30  ALT 16 14  ALKPHOS 182* 136*  BILITOT 5.1* 3.6*  PROT 6.6 5.5*  ALBUMIN 3.9 3.1*   No results for input(s): LIPASE, AMYLASE in the last 168 hours. No results for input(s): AMMONIA in the last 168 hours. Coagulation profile Recent Labs  Lab 01/18/20 1704 01/19/20 0144 01/20/20 0121 01/21/20 0310 01/22/20 0428  INR 1.7* 1.7* 1.8* 1.7* 1.6*    CBC: Recent Labs  Lab 01/18/20 1704 01/19/20 0144 01/20/20 0121 01/22/20 0428  WBC 7.8 7.9 7.1 7.1  NEUTROABS 6.1  --  5.3  --   HGB 14.4 14.0 12.2 13.0  HCT 47.5* 45.5  38.3 41.4  MCV 108.0* 108.6* 105.5* 105.9*  PLT 52* 46* 42* 40*   Cardiac Enzymes: No results for input(s): CKTOTAL, CKMB, CKMBINDEX, TROPONINI in the last 168 hours. BNP: Invalid input(s): POCBNP CBG: No results for input(s): GLUCAP in the last 168 hours. D-Dimer No results for input(s): DDIMER in the last 72 hours. Hgb A1c No results for input(s): HGBA1C in the last 72 hours. Lipid Profile No results for input(s): CHOL, HDL, LDLCALC, TRIG, CHOLHDL, LDLDIRECT in the last 72 hours. Thyroid function studies No results for input(s): TSH, T4TOTAL, T3FREE, THYROIDAB in the last 72 hours.  Invalid input(s): FREET3 Anemia work up No results for input(s): VITAMINB12, FOLATE, FERRITIN, TIBC, IRON, RETICCTPCT in the last 72 hours. Microbiology Recent Results (from the past 240 hour(s))  SARS Coronavirus 2 by RT PCR (hospital order, performed in [facility] hospital lab) Nasopharyngeal Nasopharyngeal Swab     Status: None   Collection Time: 01/18/20  3:38 PM   Specimen: Nasopharyngeal Swab  Result Value Ref Range Status   SARS Coronavirus 2 NEGATIVE NEGATIVE Final    Comment: (NOTE) SARS-CoV-2 target nucleic acids are NOT DETECTED.  The SARS-CoV-2 RNA is generally detectable in upper and lower respiratory specimens during the acute phase of infection. The lowest concentration of SARS-CoV-2 viral copies this assay can detect is 250 copies / mL. A negative result does not preclude SARS-CoV-2 infection and should not be used as the sole basis for treatment or other patient management decisions.  A negative result may occur with improper specimen collection / handling, submission of specimen other than nasopharyngeal swab, presence of viral mutation(s) within the areas targeted by  this assay, and inadequate number of viral copies (<250 copies / mL). A negative result must be combined with clinical observations, patient history, and epidemiological information.  Fact Sheet for Patients:    StrictlyIdeas.no  Fact Sheet for Healthcare Providers: BankingDealers.co.za  This test is not yet approved or  cleared by the [place] FDA and has been authorized for detection and/or diagnosis of SARS-CoV-2 by FDA under an Emergency Use Authorization (EUA).  This EUA will remain in effect (meaning this test can be used) for the duration of the COVID-19 declaration under Section 564(b)(1) of the Act, 21 U.S.C. section 360bbb-3(b)(1), unless the authorization is terminated or revoked sooner.  Performed at [facility], [place] [address], [place], [place] [address]   Urine culture     Status: Abnormal   Collection Time: 01/18/20  8:07 PM   Specimen: Urine, Random  Result Value Ref Range Status   Specimen Description URINE, RANDOM  Final   Special Requests   Final    NONE Performed at [facility], [place] [address], [place], [place] [address]    Culture >=100,000 COLONIES/mL ESCHERICHIA COLI (A)  Final   Report Status 01/20/2020 FINAL  Final   Organism ID, Bacteria ESCHERICHIA COLI (A)  Final      Susceptibility   Escherichia coli - MIC*    AMPICILLIN <=2 SENSITIVE Sensitive     CEFAZOLIN <=4 SENSITIVE Sensitive     CEFTRIAXONE <=0.25 SENSITIVE Sensitive     CIPROFLOXACIN <=0.25 SENSITIVE Sensitive     GENTAMICIN <=1 SENSITIVE Sensitive     IMIPENEM <=0.25 SENSITIVE Sensitive     NITROFURANTOIN <=16 SENSITIVE Sensitive     TRIMETH/SULFA <=20 SENSITIVE Sensitive     AMPICILLIN/SULBACTAM <=2 SENSITIVE Sensitive     PIP/TAZO <=4 SENSITIVE Sensitive     * >=100,000 COLONIES/mL ESCHERICHIA COLI     Discharge Instructions:   Discharge Instructions    Diet general   Complete by: As directed    Discharge instructions   Complete by: As directed    Follow up as per hospice provider   Discharge wound care:   Complete by: As directed    Comfort care     Allergies as of 01/24/2020      Reactions   Codeine Rash       Medication List    STOP taking these medications   allopurinol 100 MG tablet Commonly known as: ZYLOPRIM   Euthyrox 75 MCG tablet Generic drug: levothyroxine   hydrALAZINE 25 MG tablet Commonly known as: APRESOLINE   isosorbide mononitrate 30 MG 24 hr tablet Commonly known as: IMDUR   metoprolol succinate 100 MG 24 hr tablet Commonly known as: TOPROL-XL   One-A-Day Womens Formula Tabs   potassium chloride SA 20 MEQ tablet Commonly known as: KLOR-CON   rosuvastatin 20 MG tablet Commonly known as: Crestor   sodium bicarbonate 650 MG tablet   warfarin 2.5 MG tablet Commonly known as: COUMADIN     TAKE these medications   acetaminophen 325 MG tablet Commonly known as: TYLENOL Take 2 tablets (650 mg total) by mouth every 6 (six) hours as needed for mild pain (or Fever >/= 101).   meclizine 12.5 MG tablet Commonly known as: ANTIVERT Take 1 tablet (12.5 mg total) by mouth every 12 (twelve) hours as needed for dizziness.   torsemide 20 MG tablet Commonly known as: DEMADEX Take 2 tablets (40 mg total) by mouth daily.  Discharge Care Instructions  (From admission, onward)         Start     Ordered   01/24/20 0000  Discharge wound care:       Comments: Comfort care   01/24/20 1234            Time coordinating discharge: 39 minutes  Signed:  Jahmire Ruffins  [facility] 01/24/2020, 12:34 PM

## 2020-01-25 NOTE — Progress Notes (Signed)
Patient's pajama top and paper scrub bottoms placed on patient.  New socks and mesh panties with pad for incontinence placed on patient.  She was helped to stretcher to be discharged to [facility] via [facility].  Glasses on patient.  Clothes given to transporters.  Earleen Reaper RN

## 2020-01-26 ENCOUNTER — Ambulatory Visit: Payer: Medicare Other | Admitting: Neurology

## 2020-01-26 ENCOUNTER — Other Ambulatory Visit: Payer: Self-pay | Admitting: *Deleted

## 2020-01-26 NOTE — Patient Outreach (Signed)
[facility]) Care Management  01/26/2020  Casey [name] Sep 09, 1934 [id number]   Noted that member was discharged to residential hospice at [facility] on 8/14.  Call placed to daughter to confirm, she denies any questions/concerns.  Will close case at this time.  Valente David, [place], MSN [place] [phone]

## 2020-01-26 NOTE — Progress Notes (Signed)
She is now under [place] with recent hospitalization.

## 2020-02-02 ENCOUNTER — Encounter (HOSPITAL_COMMUNITY): Payer: Medicare Other | Admitting: Cardiology

## 2020-02-17 ENCOUNTER — Ambulatory Visit: Payer: PRIVATE HEALTH INSURANCE | Admitting: Diagnostic Neuroimaging

## 2020-02-17 ENCOUNTER — Telehealth: Payer: Self-pay | Admitting: *Deleted

## 2020-02-17 NOTE — Telephone Encounter (Signed)
Patient was no show for new patient appointment today. 

## 2020-03-12 DEATH — deceased

## 2020-05-24 NOTE — Telephone Encounter (Signed)
This encounter was created in error - please disregard.
